# Patient Record
Sex: Female | Born: 1940
Health system: Southern US, Community
[De-identification: ages and names within clinical notes are randomized; demographics above are authoritative.]

## PROBLEM LIST (undated history)

## (undated) DIAGNOSIS — Z780 Asymptomatic menopausal state: Secondary | ICD-10-CM

## (undated) DIAGNOSIS — C801 Malignant (primary) neoplasm, unspecified: Secondary | ICD-10-CM

## (undated) DIAGNOSIS — N6019 Diffuse cystic mastopathy of unspecified breast: Secondary | ICD-10-CM

## (undated) DIAGNOSIS — I7300 Raynaud's syndrome without gangrene: Secondary | ICD-10-CM

## (undated) DIAGNOSIS — M5430 Sciatica, unspecified side: Secondary | ICD-10-CM

## (undated) DIAGNOSIS — R0789 Other chest pain: Secondary | ICD-10-CM

## (undated) DIAGNOSIS — N819 Female genital prolapse, unspecified: Secondary | ICD-10-CM

## (undated) DIAGNOSIS — H43813 Vitreous degeneration, bilateral: Secondary | ICD-10-CM

## (undated) DIAGNOSIS — D0511 Intraductal carcinoma in situ of right breast: Secondary | ICD-10-CM

## (undated) DIAGNOSIS — M722 Plantar fascial fibromatosis: Secondary | ICD-10-CM

## (undated) DIAGNOSIS — J309 Allergic rhinitis, unspecified: Secondary | ICD-10-CM

## (undated) DIAGNOSIS — G43909 Migraine, unspecified, not intractable, without status migrainosus: Secondary | ICD-10-CM

## (undated) DIAGNOSIS — Z8719 Personal history of other diseases of the digestive system: Secondary | ICD-10-CM

## (undated) DIAGNOSIS — M1990 Unspecified osteoarthritis, unspecified site: Secondary | ICD-10-CM

## (undated) DIAGNOSIS — E785 Hyperlipidemia, unspecified: Secondary | ICD-10-CM

## (undated) DIAGNOSIS — I209 Angina pectoris, unspecified: Secondary | ICD-10-CM

## (undated) DIAGNOSIS — R339 Retention of urine, unspecified: Secondary | ICD-10-CM

## (undated) DIAGNOSIS — K219 Gastro-esophageal reflux disease without esophagitis: Secondary | ICD-10-CM

## (undated) DIAGNOSIS — I639 Cerebral infarction, unspecified: Secondary | ICD-10-CM

## (undated) DIAGNOSIS — R911 Solitary pulmonary nodule: Secondary | ICD-10-CM

## (undated) DIAGNOSIS — C50919 Malignant neoplasm of unspecified site of unspecified female breast: Secondary | ICD-10-CM

## (undated) DIAGNOSIS — D649 Anemia, unspecified: Secondary | ICD-10-CM

## (undated) DIAGNOSIS — C44300 Unspecified malignant neoplasm of skin of unspecified part of face: Secondary | ICD-10-CM

## (undated) DIAGNOSIS — Z974 Presence of external hearing-aid: Secondary | ICD-10-CM

## (undated) DIAGNOSIS — T753XXA Motion sickness, initial encounter: Secondary | ICD-10-CM

## (undated) HISTORY — DX: Female genital prolapse, unspecified: N81.9

## (undated) HISTORY — DX: Unspecified osteoarthritis, unspecified site: M19.90

## (undated) HISTORY — DX: Migraine, unspecified, not intractable, without status migrainosus: G43.909

## (undated) HISTORY — DX: Malignant neoplasm of unspecified site of unspecified female breast: C50.919

## (undated) HISTORY — DX: Plantar fascial fibromatosis: M72.2

## (undated) HISTORY — PX: CARDIAC CATHETERIZATION: SHX172

## (undated) HISTORY — DX: Sciatica, unspecified side: M54.30

## (undated) HISTORY — DX: Retention of urine, unspecified: R33.9

## (undated) HISTORY — DX: Malignant (primary) neoplasm, unspecified: C80.1

## (undated) HISTORY — PX: COLONOSCOPY: SHX174

## (undated) HISTORY — PX: APPENDECTOMY: SHX54

## (undated) HISTORY — PX: SKIN CANCER EXCISION: SHX779

## (undated) HISTORY — DX: Asymptomatic menopausal state: Z78.0

## (undated) MED FILL — Fosaprepitant Dimeglumine For IV Infusion 150 MG (Base Eq): INTRAVENOUS | Qty: 5 | Status: AC

## (undated) MED FILL — Dexamethasone Sodium Phosphate Inj 100 MG/10ML: INTRAMUSCULAR | Qty: 1 | Status: AC

---
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
## 1999-08-27 HISTORY — PX: ESOPHAGOGASTRODUODENOSCOPY: SHX1529

## 2002-10-26 HISTORY — PX: CARDIAC CATHETERIZATION: SHX172

## 2004-12-26 ENCOUNTER — Encounter: Payer: Self-pay | Admitting: General Practice

## 2005-08-28 ENCOUNTER — Ambulatory Visit: Payer: Self-pay | Admitting: Unknown Physician Specialty

## 2005-11-20 ENCOUNTER — Ambulatory Visit: Payer: Self-pay | Admitting: Unknown Physician Specialty

## 2006-03-26 ENCOUNTER — Ambulatory Visit: Payer: Self-pay | Admitting: Unknown Physician Specialty

## 2006-05-26 ENCOUNTER — Ambulatory Visit: Payer: Self-pay | Admitting: Unknown Physician Specialty

## 2006-10-05 ENCOUNTER — Ambulatory Visit: Payer: Self-pay | Admitting: Obstetrics and Gynecology

## 2009-12-04 ENCOUNTER — Ambulatory Visit: Payer: Self-pay | Admitting: Unknown Physician Specialty

## 2011-01-15 ENCOUNTER — Ambulatory Visit: Payer: Self-pay | Admitting: Unknown Physician Specialty

## 2012-07-06 ENCOUNTER — Ambulatory Visit: Payer: Self-pay | Admitting: Internal Medicine

## 2012-07-12 ENCOUNTER — Encounter: Payer: Self-pay | Admitting: Internal Medicine

## 2012-07-26 ENCOUNTER — Encounter: Payer: Self-pay | Admitting: Internal Medicine

## 2012-10-05 ENCOUNTER — Ambulatory Visit: Payer: Self-pay | Admitting: Internal Medicine

## 2013-01-02 ENCOUNTER — Ambulatory Visit: Payer: Self-pay | Admitting: Internal Medicine

## 2013-07-11 ENCOUNTER — Ambulatory Visit: Payer: Self-pay | Admitting: Internal Medicine

## 2014-01-16 ENCOUNTER — Ambulatory Visit: Payer: Self-pay | Admitting: Internal Medicine

## 2014-09-17 DIAGNOSIS — Z85828 Personal history of other malignant neoplasm of skin: Secondary | ICD-10-CM | POA: Insufficient documentation

## 2014-12-10 ENCOUNTER — Ambulatory Visit: Payer: Self-pay | Admitting: Internal Medicine

## 2014-12-11 LAB — HM PAP SMEAR

## 2015-01-11 LAB — HM MAMMOGRAPHY

## 2015-01-14 DIAGNOSIS — M5430 Sciatica, unspecified side: Secondary | ICD-10-CM | POA: Insufficient documentation

## 2015-01-14 DIAGNOSIS — R339 Retention of urine, unspecified: Secondary | ICD-10-CM | POA: Insufficient documentation

## 2015-01-14 DIAGNOSIS — N951 Menopausal and female climacteric states: Secondary | ICD-10-CM | POA: Insufficient documentation

## 2015-11-15 DIAGNOSIS — H2513 Age-related nuclear cataract, bilateral: Secondary | ICD-10-CM | POA: Diagnosis not present

## 2015-12-17 ENCOUNTER — Encounter: Payer: Self-pay | Admitting: Obstetrics and Gynecology

## 2015-12-17 ENCOUNTER — Ambulatory Visit (INDEPENDENT_AMBULATORY_CARE_PROVIDER_SITE_OTHER): Payer: PRIVATE HEALTH INSURANCE | Admitting: Obstetrics and Gynecology

## 2015-12-17 VITALS — BP 144/66 | HR 67 | Ht 62.0 in | Wt 111.3 lb

## 2015-12-17 DIAGNOSIS — E785 Hyperlipidemia, unspecified: Secondary | ICD-10-CM | POA: Insufficient documentation

## 2015-12-17 DIAGNOSIS — Z1231 Encounter for screening mammogram for malignant neoplasm of breast: Secondary | ICD-10-CM

## 2015-12-17 DIAGNOSIS — N819 Female genital prolapse, unspecified: Secondary | ICD-10-CM | POA: Diagnosis not present

## 2015-12-17 DIAGNOSIS — J309 Allergic rhinitis, unspecified: Secondary | ICD-10-CM | POA: Insufficient documentation

## 2015-12-17 DIAGNOSIS — K219 Gastro-esophageal reflux disease without esophagitis: Secondary | ICD-10-CM | POA: Insufficient documentation

## 2015-12-17 DIAGNOSIS — Z79890 Hormone replacement therapy (postmenopausal): Secondary | ICD-10-CM | POA: Diagnosis not present

## 2015-12-17 DIAGNOSIS — R0789 Other chest pain: Secondary | ICD-10-CM | POA: Insufficient documentation

## 2015-12-17 DIAGNOSIS — N951 Menopausal and female climacteric states: Secondary | ICD-10-CM

## 2015-12-17 DIAGNOSIS — C4490 Unspecified malignant neoplasm of skin, unspecified: Secondary | ICD-10-CM | POA: Insufficient documentation

## 2015-12-17 DIAGNOSIS — N814 Uterovaginal prolapse, unspecified: Secondary | ICD-10-CM | POA: Insufficient documentation

## 2015-12-17 DIAGNOSIS — R918 Other nonspecific abnormal finding of lung field: Secondary | ICD-10-CM | POA: Insufficient documentation

## 2015-12-17 DIAGNOSIS — N6019 Diffuse cystic mastopathy of unspecified breast: Secondary | ICD-10-CM | POA: Insufficient documentation

## 2015-12-17 MED ORDER — ESTROPIPATE 1.5 MG PO TABS: 1.5000 mg | ORAL_TABLET | Freq: Every day | ORAL | Status: DC

## 2015-12-17 MED ORDER — MEDROXYPROGESTERONE ACETATE 5 MG PO TABS: 5.0000 mg | ORAL_TABLET | Freq: Every day | ORAL | Status: DC

## 2015-12-17 NOTE — Progress Notes (Signed)
Patient ID: Elizabeth Friedman, female   DOB: [birth year], 75 y.o.   MRN: [id number] ANNUAL PREVENTATIVE CARE GYN  ENCOUNTER NOTE  Subjective:       Elizabeth Friedman is a 75 y.o. No obstetric history on file. female here for a routine annual gynecologic exam.  Current complaints: 1.  Medicare breast and pelvic only 2. Follow-up in HRT  Elizabeth Friedman is on cyclic HRT therapy and has occasional withdrawal bleed. No pelvic pain. No vaginal discharge. Patient is using the hodge pessary for pelvic organ prolapse. This middle pessary has not been removed in the last 5 years because of difficulty with reinsertion. She is willing to leave the pessary in place the long as no symptomatology is identified. Elizabeth Friedman does desire to remain on HRT therapy.    Gynecologic History No LMP recorded. Patient is postmenopausal. Contraception: post menopausal status Last Pap: no further paps. Results were: normal Last mammogram: 01/11/2015 wnl. Results were: normal Estropipate 1.5 mg daily; Provera 5 mg days 1 through 12  Obstetric History OB History  No data available    Past Medical History  Diagnosis Date  . Sciatica   . Arthritis   . Menopause   . Urinary retention with incomplete bladder emptying   . Prolapse of female pelvic organs     hodge pessary  . Sciatic leg pain   . Plantar fasciitis     Past Surgical History  Procedure Laterality Date  . Appendectomy      Current Outpatient Prescriptions on File Prior to Visit  Medication Sig Dispense Refill  . acetaminophen (TYLENOL) 500 MG tablet Take 500 mg by mouth every 6 (six) hours as needed.    Elizabeth Friedman Kitchen aspirin EC 81 MG tablet Take 81 mg by mouth daily.    . calcium carbonate 1250 MG capsule Take 1,250 mg by mouth 2 (two) times daily with a meal.    . celecoxib (CELEBREX) 200 MG capsule Take 200 mg by mouth 2 (two) times daily.    . cholecalciferol (VITAMIN D) 1000 units tablet Take 1,000 Units by mouth daily.    Elizabeth Friedman Kitchen co-enzyme Q-10 30 MG capsule Take 30  mg by mouth 3 (three) times daily.    Elizabeth Friedman Kitchen estropipate (OGEN) 1.5 MG tablet Take 1.5 mg by mouth daily.    . medroxyPROGESTERone (PROVERA) 5 MG tablet Take 5 mg by mouth daily.    . Omega-3 Fatty Acids (FISH OIL) 1000 MG CAPS Take by mouth.    . simvastatin (ZOCOR) 10 MG tablet Take 10 mg by mouth daily.    . vitamin E 400 UNIT capsule Take 400 Units by mouth daily.     No current facility-administered medications on file prior to visit.    No Known Allergies  Social History   Social History  . Marital Status: Married    Spouse Name: N/A  . Number of Children: N/A  . Years of Education: N/A   Occupational History  . Not on file.   Social History Main Topics  . Smoking status: Never Smoker   . Smokeless tobacco: Not on file  . Alcohol Use: No  . Drug Use: No  . Sexual Activity: Not Currently    Birth Control/ Protection: Post-menopausal   Other Topics Concern  . Not on file   Social History Narrative    Family History  Problem Relation Age of Onset  . Breast cancer Mother   . Heart disease Father   . Colon cancer Brother   . Diabetes Neg  Hx   . Ovarian cancer Neg Hx     The following portions of the patient's history were reviewed and updated as appropriate: allergies, current medications, past family history, past medical history, past social history, past surgical history and problem list.  Review of Systems ROS Review of Systems - General ROS: negative for - chills, fatigue, fever, hot flashes, night sweats, weight gain or weight loss Psychological ROS: negative for - anxiety, decreased libido, depression, mood swings, physical abuse or sexual abuse Ophthalmic ROS: negative for - blurry vision, eye pain or loss of vision ENT ROS: negative for - headaches, hearing change, visual changes or vocal changes Allergy and Immunology ROS: negative for - hives, itchy/watery eyes or seasonal allergies Hematological and Lymphatic ROS: negative for - bleeding problems,  bruising, swollen lymph nodes or weight loss Endocrine ROS: negative for - galactorrhea, hair pattern changes, hot flashes, malaise/lethargy, mood swings, palpitations, polydipsia/polyuria, skin changes, temperature intolerance or unexpected weight changes Breast ROS: negative for - new or changing breast lumps or nipple discharge Respiratory ROS: negative for - cough or shortness of breath Cardiovascular ROS: negative for - chest pain, irregular heartbeat, palpitations or shortness of breath Gastrointestinal ROS: no abdominal pain, change in bowel habits, or black or bloody stools Genito-Urinary ROS: no dysuria, trouble voiding, or hematuria Musculoskeletal ROS: negative for - joint pain or joint stiffness Neurological ROS: negative for - bowel and bladder control changes Dermatological ROS: negative for rash and skin lesion changes   Objective:   BP 144/66 mmHg  Pulse 67  Ht 5\' 2"  (1.575 m)  Wt 111 lb 4.8 oz (50.485 kg)  BMI 20.35 kg/m2 CONSTITUTIONAL: Well-developed, well-nourished female in no acute distress.  PSYCHIATRIC: Normal mood and affect. Normal behavior. Normal judgment and thought content. [place]: Alert and oriented to person, place, and time. Normal muscle tone coordination. No cranial nerve deficit noted. HENT:  Normocephalic, atraumatic, External right and left ear normal. Oropharynx is clear and moist EYES: Conjunctivae and EOM are normal. Pupils are equal, round, and reactive to light. No scleral icterus.  NECK: Normal range of motion, supple, no masses.  Normal thyroid.  SKIN: Skin is warm and dry. No rash noted. Not diaphoretic. No erythema. No pallor. CARDIOVASCULAR: Normal heart rate noted, regular rhythm, no murmur. RESPIRATORY: Clear to auscultation bilaterally. Effort and breath sounds normal, no problems with respiration noted. BREASTS: Symmetric in size. No masses, skin changes, nipple drainage, or lymphadenopathy. ABDOMEN: Soft, normal bowel sounds, no  distention noted.  No tenderness, rebound or guarding.  BLADDER: Normal PELVIC:  External Genitalia: Normal  BUS: Normal  Vagina: Normal  Cervix: Normal  Uterus: Normal  Adnexa: Normal  RV: Moderate external hemorrhoids, nonthrombosed, No Rectal Masses and Normal Sphincter tone  MUSCULOSKELETAL: Normal range of motion. No tenderness.  No cyanosis, clubbing, or edema.  2+ distal pulses. LYMPHATIC: No Axillary, Supraclavicular, or Inguinal Adenopathy.    Assessment:   Annual gynecologic examination 75 y.o. Contraception: post menopausal status bmi-20 Menopausal on HRT therapy; desires to continue Pelvic organ prolapse treated with Janalyn Harder metal pessary, asymptomatic  Plan:  Pap: Not needed Mammogram: Ordered Stool Guaiac Testing:  colonoscopy this year Labs: thru pcp Routine preventative health maintenance measures emphasized: Exercise/Diet/Weight control, Tobacco Warnings and Alcohol/Substance use risks HRT refilled Continue with pessary use  Return to [place], CMA  Brayton Mars, MD  Note: This dictation was prepared with Dragon dictation along with smaller phrase technology. Any transcriptional errors that result from  this process are unintentional.

## 2015-12-17 NOTE — Patient Instructions (Signed)
1. Refill estropipate 1.5 mg daily 2. Refill Provera 5 mg days 1 through 12 each month 3. Mammogram is already scheduled. 4. No Pap smear is necessary. 5. Colonoscopy screening is to be done in the next 1-2 months. 6. Continue with Hodge pessary. 7. Return in 1 year for follow-up 8. Continue with calcium and vitamin D supplementation

## 2015-12-24 DIAGNOSIS — Z8371 Family history of colonic polyps: Secondary | ICD-10-CM | POA: Diagnosis not present

## 2015-12-24 DIAGNOSIS — Z800 Family history of malignant neoplasm of digestive organs: Secondary | ICD-10-CM | POA: Diagnosis not present

## 2016-01-16 DIAGNOSIS — Z791 Long term (current) use of non-steroidal anti-inflammatories (NSAID): Secondary | ICD-10-CM | POA: Diagnosis not present

## 2016-01-16 DIAGNOSIS — E784 Other hyperlipidemia: Secondary | ICD-10-CM | POA: Diagnosis not present

## 2016-01-23 DIAGNOSIS — J3089 Other allergic rhinitis: Secondary | ICD-10-CM | POA: Diagnosis not present

## 2016-01-23 DIAGNOSIS — Z7982 Long term (current) use of aspirin: Secondary | ICD-10-CM | POA: Diagnosis not present

## 2016-01-23 DIAGNOSIS — E784 Other hyperlipidemia: Secondary | ICD-10-CM | POA: Diagnosis not present

## 2016-01-23 DIAGNOSIS — K219 Gastro-esophageal reflux disease without esophagitis: Secondary | ICD-10-CM | POA: Diagnosis not present

## 2016-01-27 ENCOUNTER — Other Ambulatory Visit: Payer: Self-pay | Admitting: Obstetrics and Gynecology

## 2016-01-28 DIAGNOSIS — Z803 Family history of malignant neoplasm of breast: Secondary | ICD-10-CM | POA: Diagnosis not present

## 2016-01-28 DIAGNOSIS — Z1231 Encounter for screening mammogram for malignant neoplasm of breast: Secondary | ICD-10-CM | POA: Diagnosis not present

## 2016-02-17 ENCOUNTER — Encounter: Payer: Self-pay | Admitting: Obstetrics and Gynecology

## 2016-03-05 ENCOUNTER — Telehealth: Payer: Self-pay | Admitting: Obstetrics and Gynecology

## 2016-03-05 MED ORDER — ESTROPIPATE 1.5 MG PO TABS: 1.5000 mg | ORAL_TABLET | Freq: Every day | ORAL | Status: DC

## 2016-03-05 NOTE — Telephone Encounter (Signed)
Pt aware med erx. 

## 2016-03-05 NOTE — Telephone Encounter (Signed)
PT NEEDS REFILL ESTROPIPATE 1.5 MG TABS ([facility]) [place]

## 2016-03-26 ENCOUNTER — Encounter: Payer: Self-pay | Admitting: *Deleted

## 2016-03-27 ENCOUNTER — Encounter: Payer: Self-pay | Admitting: *Deleted

## 2016-03-27 ENCOUNTER — Encounter
Admission: RE | Disposition: A | Discharge status: Home / Self Care | Payer: Self-pay | Source: Ambulatory Visit | Attending: Unknown Physician Specialty

## 2016-03-27 ENCOUNTER — Ambulatory Visit: Payer: PPO | Admitting: Certified Registered"

## 2016-03-27 ENCOUNTER — Ambulatory Visit
Admission: RE | Admit: 2016-03-27 | Discharge: 2016-03-27 | Disposition: A | Discharge status: Home / Self Care | Payer: PPO | Source: Ambulatory Visit | Attending: Unknown Physician Specialty | Admitting: Unknown Physician Specialty

## 2016-03-27 DIAGNOSIS — Z803 Family history of malignant neoplasm of breast: Secondary | ICD-10-CM | POA: Diagnosis not present

## 2016-03-27 DIAGNOSIS — M1990 Unspecified osteoarthritis, unspecified site: Secondary | ICD-10-CM | POA: Insufficient documentation

## 2016-03-27 DIAGNOSIS — Z85828 Personal history of other malignant neoplasm of skin: Secondary | ICD-10-CM | POA: Diagnosis not present

## 2016-03-27 DIAGNOSIS — Z9889 Other specified postprocedural states: Secondary | ICD-10-CM | POA: Diagnosis not present

## 2016-03-27 DIAGNOSIS — H43813 Vitreous degeneration, bilateral: Secondary | ICD-10-CM | POA: Diagnosis not present

## 2016-03-27 DIAGNOSIS — J309 Allergic rhinitis, unspecified: Secondary | ICD-10-CM | POA: Diagnosis not present

## 2016-03-27 DIAGNOSIS — K640 First degree hemorrhoids: Secondary | ICD-10-CM | POA: Insufficient documentation

## 2016-03-27 DIAGNOSIS — N6019 Diffuse cystic mastopathy of unspecified breast: Secondary | ICD-10-CM | POA: Insufficient documentation

## 2016-03-27 DIAGNOSIS — E785 Hyperlipidemia, unspecified: Secondary | ICD-10-CM | POA: Insufficient documentation

## 2016-03-27 DIAGNOSIS — Z1211 Encounter for screening for malignant neoplasm of colon: Secondary | ICD-10-CM | POA: Insufficient documentation

## 2016-03-27 DIAGNOSIS — Z7982 Long term (current) use of aspirin: Secondary | ICD-10-CM | POA: Insufficient documentation

## 2016-03-27 DIAGNOSIS — R911 Solitary pulmonary nodule: Secondary | ICD-10-CM | POA: Insufficient documentation

## 2016-03-27 DIAGNOSIS — Z8249 Family history of ischemic heart disease and other diseases of the circulatory system: Secondary | ICD-10-CM | POA: Diagnosis not present

## 2016-03-27 DIAGNOSIS — K648 Other hemorrhoids: Secondary | ICD-10-CM | POA: Diagnosis not present

## 2016-03-27 DIAGNOSIS — Z9049 Acquired absence of other specified parts of digestive tract: Secondary | ICD-10-CM | POA: Insufficient documentation

## 2016-03-27 DIAGNOSIS — Z800 Family history of malignant neoplasm of digestive organs: Secondary | ICD-10-CM | POA: Diagnosis not present

## 2016-03-27 DIAGNOSIS — K219 Gastro-esophageal reflux disease without esophagitis: Secondary | ICD-10-CM | POA: Insufficient documentation

## 2016-03-27 HISTORY — PX: COLONOSCOPY WITH PROPOFOL: SHX5780

## 2016-03-27 HISTORY — DX: Malignant (primary) neoplasm, unspecified: C80.1

## 2016-03-27 HISTORY — DX: Solitary pulmonary nodule: R91.1

## 2016-03-27 HISTORY — DX: Vitreous degeneration, bilateral: H43.813

## 2016-03-27 HISTORY — DX: Other chest pain: R07.89

## 2016-03-27 HISTORY — DX: Allergic rhinitis, unspecified: J30.9

## 2016-03-27 HISTORY — DX: Hyperlipidemia, unspecified: E78.5

## 2016-03-27 HISTORY — DX: Gastro-esophageal reflux disease without esophagitis: K21.9

## 2016-03-27 HISTORY — DX: Diffuse cystic mastopathy of unspecified breast: N60.19

## 2016-03-27 SURGERY — COLONOSCOPY WITH PROPOFOL
Anesthesia: General

## 2016-03-27 MED ORDER — PROPOFOL 10 MG/ML IV BOLUS
INTRAVENOUS | Status: DC | PRN
  Administered 2016-03-27: 70 mg via INTRAVENOUS

## 2016-03-27 MED ORDER — SODIUM CHLORIDE 0.9 % IV SOLN
INTRAVENOUS | Status: DC
  Administered 2016-03-27: 08:00:00 via INTRAVENOUS

## 2016-03-27 MED ORDER — PROPOFOL 500 MG/50ML IV EMUL
INTRAVENOUS | Status: DC | PRN
  Administered 2016-03-27: 120 ug/kg/min via INTRAVENOUS

## 2016-03-27 MED ORDER — SODIUM CHLORIDE 0.9 % IV SOLN: INTRAVENOUS | Status: DC

## 2016-03-27 MED ORDER — LIDOCAINE 2% (20 MG/ML) 5 ML SYRINGE
INTRAMUSCULAR | Status: DC | PRN
  Administered 2016-03-27: 50 mg via INTRAVENOUS

## 2016-03-27 MED ORDER — MIDAZOLAM HCL 5 MG/5ML IJ SOLN
INTRAMUSCULAR | Status: DC | PRN
  Administered 2016-03-27: 1 mg via INTRAVENOUS

## 2016-03-27 NOTE — Anesthesia Postprocedure Evaluation (Signed)
Anesthesia Post Note  Patient: Elizabeth Friedman  Procedure(s) Performed: Procedure(s) (LRB): COLONOSCOPY WITH PROPOFOL (N/A)  Patient location during evaluation: Endoscopy Anesthesia Type: General Level of consciousness: awake and alert Pain management: pain level controlled Vital Signs Assessment: post-procedure vital signs reviewed and stable Respiratory status: spontaneous breathing, nonlabored ventilation, respiratory function stable and patient connected to nasal cannula oxygen Cardiovascular status: blood pressure returned to baseline and stable Postop Assessment: no signs of nausea or vomiting Anesthetic complications: no    Last Vitals:  Filed Vitals:   03/27/16 0839 03/27/16 0849  BP: 159/93 143/98  Pulse: 38 38  Temp:    Resp: 19 17    Last Pain: There were no vitals filed for this visit.               [name] S

## 2016-03-27 NOTE — H&P (Signed)
Primary Care Physician:  Pcp Not In System Primary Gastroenterologist:  Dr. Vira Agar  Pre-Procedure History & Physical: HPI:  Elizabeth Friedman is a 75 y.o. female is here for an colonoscopy.   Past Medical History  Diagnosis Date  . Sciatica   . Arthritis   . Menopause   . Urinary retention with incomplete bladder emptying   . Prolapse of female pelvic organs     hodge pessary  . Sciatic leg pain   . Plantar fasciitis   . Cancer ([place])     skin  . GERD (gastroesophageal reflux disease)   . Allergic rhinitis   . Atypical chest pain   . Vitreous detachment of both eyes   . Fibrocystic breast disease   . Hyperlipidemia   . Lung nodule     Past Surgical History  Procedure Laterality Date  . Appendectomy    . Cardiac catheterization    . Skin cancer excision      face and neck    Prior to Admission medications   Medication Sig Start Date End Date Taking? Authorizing Provider  aspirin 325 MG EC tablet Take 325 mg by mouth daily.   Yes Historical Provider, MD  celecoxib (CELEBREX) 200 MG capsule Take 200 mg by mouth 2 (two) times daily.   Yes Historical Provider, MD  Omega-3 Fatty Acids (FISH OIL) 1000 MG CAPS Take by mouth.   Yes Historical Provider, MD  omeprazole (PRILOSEC) 20 MG capsule Take 20 mg by mouth daily.   Yes Historical Provider, MD  polyethylene glycol powder (GLYCOLAX/MIRALAX) powder Take 1 Container by mouth once.   Yes Historical Provider, MD  simvastatin (ZOCOR) 10 MG tablet Take 10 mg by mouth daily.   Yes Historical Provider, MD  acetaminophen (TYLENOL) 500 MG tablet Take 500 mg by mouth every 6 (six) hours as needed.    Historical Provider, MD  aspirin EC 81 MG tablet Take 81 mg by mouth daily. Reported on 03/27/2016    Historical Provider, MD  calcium carbonate 1250 MG capsule Take 1,250 mg by mouth 2 (two) times daily with a meal.    Historical Provider, MD  cholecalciferol (VITAMIN D) 1000 units tablet Take 1,000 Units by mouth daily.    Historical  Provider, MD  co-enzyme Q-10 30 MG capsule Take 30 mg by mouth 3 (three) times daily.    Historical Provider, MD  estropipate (OGEN) 1.5 MG tablet Take 1 tablet (1.5 mg total) by mouth daily. 03/05/16   Alanda Slim Defrancesco, MD  medroxyPROGESTERone (PROVERA) 5 MG tablet TAKE ONE TABLET BY MOUTH ONCE DAILY ON  DAYS  1-12 01/28/16   Alanda Slim Defrancesco, MD  RESTASIS 0.05 % ophthalmic emulsion  10/24/15   Historical Provider, MD  vitamin E 400 UNIT capsule Take 400 Units by mouth daily.    Historical Provider, MD    Allergies as of 01/06/2016  . (No Known Allergies)    Family History  Problem Relation Age of Onset  . Breast cancer Mother   . Heart disease Father   . Colon cancer Brother   . Diabetes Neg Hx   . Ovarian cancer Neg Hx     Social History   Social History  . Marital Status: Married    Spouse Name: N/A  . Number of Children: N/A  . Years of Education: N/A   Occupational History  . Not on file.   Social History Main Topics  . Smoking status: Never Smoker   . Smokeless tobacco: Never  Used  . Alcohol Use: No  . Drug Use: No  . Sexual Activity: Not Currently    Birth Control/ Protection: Post-menopausal   Other Topics Concern  . Not on file   Social History Narrative    Review of Systems: See HPI, otherwise negative ROS  Physical Exam: BP 150/80 mmHg  Pulse 70  Temp(Src) 96.3 F (35.7 C) (Tympanic)  Resp 16  Ht 5\' 2"  (1.575 m)  Wt 49.896 kg (110 lb)  BMI 20.11 kg/m2  SpO2 95% General:   Alert,  pleasant and cooperative in NAD Head:  Normocephalic and atraumatic. Neck:  Supple; no masses or thyromegaly. Lungs:  Clear throughout to auscultation.    Heart:  Regular rate and rhythm. Abdomen:  Soft, nontender and nondistended. Normal bowel sounds, without guarding, and without rebound.   Neurologic:  Alert and  oriented x4;  grossly normal neurologically.  Impression/Plan: Elizabeth Friedman is here for an colonoscopy to be performed for family history  of colon cancer  Risks, benefits, limitations, and alternatives regarding  colonoscopy have been reviewed with the patient.  Questions have been answered.  All parties agreeable.   Gaylyn Cheers, MD  03/27/2016, 7:55 AM

## 2016-03-27 NOTE — Transfer of Care (Signed)
Immediate Anesthesia Transfer of Care Note  Patient: Elizabeth Friedman  Procedure(s) Performed: Procedure(s): COLONOSCOPY WITH PROPOFOL (N/A)  Patient Location: Endoscopy Unit  Anesthesia Type:General  Level of Consciousness: awake  Airway & Oxygen Therapy: Patient Spontanous Breathing and Patient connected to nasal cannula oxygen  Post-op Assessment: Report given to RN  Post vital signs: Reviewed  Last Vitals:  Filed Vitals:   03/27/16 0818 03/27/16 0819  BP:  113/67  Pulse:  72  Temp: 36.1 C 36.1 C  Resp:  14    Last Pain: There were no vitals filed for this visit.       Complications: No apparent anesthesia complications

## 2016-03-27 NOTE — Op Note (Signed)
[facility] Gastroenterology Patient Name: Elizabeth Friedman Procedure Date: 03/27/2016 7:56 AM MRN: [id number] Account #: [id number] Date of Birth: 10-17-1941 Admit Type: Outpatient Age: 75 Room: [facility] ENDO ROOM 4 Gender: Female Note Status: Finalized Procedure:            Colonoscopy Indications:          Screening in patient at increased risk: Family history                        of 1st-degree relative with colorectal cancer Providers:            Manya Silvas, MD Referring MD:         No Local Md, MD (Referring MD) Medicines:            Propofol per Anesthesia Complications:        No immediate complications. Procedure:            Pre-Anesthesia Assessment:                       - After reviewing the risks and benefits, the patient                        was deemed in satisfactory condition to undergo the                        procedure.                       After obtaining informed consent, the colonoscope was                        passed under direct vision. Throughout the procedure,                        the patient's blood pressure, pulse, and oxygen                        saturations were monitored continuously. The                        Colonoscope was introduced through the anus and                        advanced to the the cecum, identified by appendiceal                        orifice and ileocecal valve. The colonoscopy was                        performed without difficulty. The patient tolerated the                        procedure well. The quality of the bowel preparation                        was excellent. Findings:      Internal hemorrhoids were found during endoscopy. The hemorrhoids were       small and Grade I (internal hemorrhoids that do not prolapse).      The exam was otherwise without abnormality. Impression:           -  Internal hemorrhoids.                       - The examination was otherwise normal.                       -  No specimens collected. Recommendation:       - Repeat colonoscopy in 5 years for screening purposes. Manya Silvas, MD 03/27/2016 8:17:59 AM This report has been signed electronically. Number of Addenda: 0 Note Initiated On: 03/27/2016 7:56 AM Scope Withdrawal Time: 0 hours 5 minutes 49 seconds  Total Procedure Duration: 0 hours 12 minutes 29 seconds       [facility]

## 2016-03-27 NOTE — Anesthesia Preprocedure Evaluation (Signed)
Anesthesia Evaluation  Patient identified by MRN, date of birth, ID band Patient awake    Reviewed: Allergy & Precautions, NPO status , Patient's Chart, lab work & pertinent test results, reviewed documented beta blocker date and time   Airway Mallampati: II  TM Distance: >3 FB     Dental  (+) Chipped   Pulmonary           Cardiovascular      Neuro/Psych  Neuromuscular disease    GI/Hepatic GERD  ,  Endo/Other    Renal/GU      Musculoskeletal  (+) Arthritis ,   Abdominal   Peds  Hematology   Anesthesia Other Findings Hx of Raynauds.  Reproductive/Obstetrics                             Anesthesia Physical Anesthesia Plan  ASA: II  Anesthesia Plan: General   Post-op Pain Management:    Induction: Intravenous  Airway Management Planned: Nasal Cannula  Additional Equipment:   Intra-op Plan:   Post-operative Plan:   Informed Consent: I have reviewed the patients History and Physical, chart, labs and discussed the procedure including the risks, benefits and alternatives for the proposed anesthesia with the patient or authorized representative who has indicated his/her understanding and acceptance.     Plan Discussed with: CRNA  Anesthesia Plan Comments:         Anesthesia Quick Evaluation

## 2016-03-30 ENCOUNTER — Encounter: Payer: Self-pay | Admitting: Unknown Physician Specialty

## 2016-05-02 ENCOUNTER — Other Ambulatory Visit: Payer: Self-pay | Admitting: Obstetrics and Gynecology

## 2016-05-04 ENCOUNTER — Telehealth: Payer: Self-pay | Admitting: Obstetrics and Gynecology

## 2016-05-04 NOTE — Telephone Encounter (Signed)
Lmtrc. Provera erx.

## 2016-05-04 NOTE — Telephone Encounter (Signed)
PT CALLED AND SHE STATED THAT SHE  NEEDS MEDORXYPR 5 MG GENERIC FOR PROVERA, I TOLD HER TO CALL HER PHARMACY AND GET THEM TO SEND IN A REFILL REQUEST BUT SHE STATED THAT HER PHARMACY TOLD HER TO CALL us DUE TO SOMETIME NOT GETTING THE FAX.

## 2016-05-05 NOTE — Telephone Encounter (Signed)
Pt aware med erx. 

## 2016-07-16 DIAGNOSIS — E784 Other hyperlipidemia: Secondary | ICD-10-CM | POA: Diagnosis not present

## 2016-07-16 DIAGNOSIS — Z7982 Long term (current) use of aspirin: Secondary | ICD-10-CM | POA: Diagnosis not present

## 2016-07-16 DIAGNOSIS — K219 Gastro-esophageal reflux disease without esophagitis: Secondary | ICD-10-CM | POA: Diagnosis not present

## 2016-07-23 DIAGNOSIS — K219 Gastro-esophageal reflux disease without esophagitis: Secondary | ICD-10-CM | POA: Diagnosis not present

## 2016-07-23 DIAGNOSIS — Z791 Long term (current) use of non-steroidal anti-inflammatories (NSAID): Secondary | ICD-10-CM | POA: Diagnosis not present

## 2016-07-23 DIAGNOSIS — M5430 Sciatica, unspecified side: Secondary | ICD-10-CM | POA: Diagnosis not present

## 2016-07-23 DIAGNOSIS — E784 Other hyperlipidemia: Secondary | ICD-10-CM | POA: Diagnosis not present

## 2016-09-24 DIAGNOSIS — L708 Other acne: Secondary | ICD-10-CM | POA: Diagnosis not present

## 2016-09-24 DIAGNOSIS — L821 Other seborrheic keratosis: Secondary | ICD-10-CM | POA: Diagnosis not present

## 2016-09-24 DIAGNOSIS — Z85828 Personal history of other malignant neoplasm of skin: Secondary | ICD-10-CM | POA: Diagnosis not present

## 2016-09-24 DIAGNOSIS — B351 Tinea unguium: Secondary | ICD-10-CM | POA: Diagnosis not present

## 2016-10-30 DIAGNOSIS — M79602 Pain in left arm: Secondary | ICD-10-CM | POA: Diagnosis not present

## 2016-10-30 DIAGNOSIS — M19012 Primary osteoarthritis, left shoulder: Secondary | ICD-10-CM | POA: Diagnosis not present

## 2016-11-03 ENCOUNTER — Telehealth: Payer: Self-pay | Admitting: Obstetrics and Gynecology

## 2016-11-03 MED ORDER — MEDROXYPROGESTERONE ACETATE 5 MG PO TABS: 5.0000 mg | ORAL_TABLET | Freq: Every day | ORAL | 1 refills | Status: DC

## 2016-11-03 NOTE — Telephone Encounter (Signed)
PT CALLED AND SHE IS NEEDING A REFILL ON HER PROGESTERONE, I TOLD HER TO CALL HER PHARMACY AND SEND A REFILL REQUEST IN BUT SHE STATED THAT IN THE PAST WHEN SHE HAS DONE THAT WE NEVER GET THE FAX AND SHE HAS TO END UP CALLING us, HER APPT WITH [place] IS NOT UNTIL FEB.

## 2016-11-03 NOTE — Telephone Encounter (Signed)
Pt aware.

## 2016-11-03 NOTE — Telephone Encounter (Signed)
Lmtrc- med erx to [facility] [place].

## 2016-11-10 DIAGNOSIS — M25612 Stiffness of left shoulder, not elsewhere classified: Secondary | ICD-10-CM | POA: Diagnosis not present

## 2016-11-10 DIAGNOSIS — M25512 Pain in left shoulder: Secondary | ICD-10-CM | POA: Diagnosis not present

## 2016-11-10 DIAGNOSIS — M6281 Muscle weakness (generalized): Secondary | ICD-10-CM | POA: Diagnosis not present

## 2016-11-16 DIAGNOSIS — H35413 Lattice degeneration of retina, bilateral: Secondary | ICD-10-CM | POA: Diagnosis not present

## 2016-11-17 DIAGNOSIS — M6281 Muscle weakness (generalized): Secondary | ICD-10-CM | POA: Diagnosis not present

## 2016-11-17 DIAGNOSIS — M25612 Stiffness of left shoulder, not elsewhere classified: Secondary | ICD-10-CM | POA: Diagnosis not present

## 2016-11-17 DIAGNOSIS — M25512 Pain in left shoulder: Secondary | ICD-10-CM | POA: Diagnosis not present

## 2016-11-19 DIAGNOSIS — M25512 Pain in left shoulder: Secondary | ICD-10-CM | POA: Diagnosis not present

## 2016-11-19 DIAGNOSIS — M6281 Muscle weakness (generalized): Secondary | ICD-10-CM | POA: Diagnosis not present

## 2016-11-19 DIAGNOSIS — M25612 Stiffness of left shoulder, not elsewhere classified: Secondary | ICD-10-CM | POA: Diagnosis not present

## 2016-11-23 DIAGNOSIS — M25512 Pain in left shoulder: Secondary | ICD-10-CM | POA: Diagnosis not present

## 2016-11-23 DIAGNOSIS — M6281 Muscle weakness (generalized): Secondary | ICD-10-CM | POA: Diagnosis not present

## 2016-11-23 DIAGNOSIS — M25612 Stiffness of left shoulder, not elsewhere classified: Secondary | ICD-10-CM | POA: Diagnosis not present

## 2016-11-26 DIAGNOSIS — M79602 Pain in left arm: Secondary | ICD-10-CM | POA: Diagnosis not present

## 2016-11-26 DIAGNOSIS — M25512 Pain in left shoulder: Secondary | ICD-10-CM | POA: Diagnosis not present

## 2016-11-26 DIAGNOSIS — M25612 Stiffness of left shoulder, not elsewhere classified: Secondary | ICD-10-CM | POA: Diagnosis not present

## 2016-11-26 DIAGNOSIS — M6281 Muscle weakness (generalized): Secondary | ICD-10-CM | POA: Diagnosis not present

## 2016-11-30 DIAGNOSIS — M25612 Stiffness of left shoulder, not elsewhere classified: Secondary | ICD-10-CM | POA: Diagnosis not present

## 2016-11-30 DIAGNOSIS — M6281 Muscle weakness (generalized): Secondary | ICD-10-CM | POA: Diagnosis not present

## 2016-11-30 DIAGNOSIS — M25512 Pain in left shoulder: Secondary | ICD-10-CM | POA: Diagnosis not present

## 2016-12-03 DIAGNOSIS — M25512 Pain in left shoulder: Secondary | ICD-10-CM | POA: Diagnosis not present

## 2016-12-03 DIAGNOSIS — M25612 Stiffness of left shoulder, not elsewhere classified: Secondary | ICD-10-CM | POA: Diagnosis not present

## 2016-12-03 DIAGNOSIS — M6281 Muscle weakness (generalized): Secondary | ICD-10-CM | POA: Diagnosis not present

## 2016-12-08 DIAGNOSIS — M6281 Muscle weakness (generalized): Secondary | ICD-10-CM | POA: Diagnosis not present

## 2016-12-08 DIAGNOSIS — M25612 Stiffness of left shoulder, not elsewhere classified: Secondary | ICD-10-CM | POA: Diagnosis not present

## 2016-12-08 DIAGNOSIS — M25512 Pain in left shoulder: Secondary | ICD-10-CM | POA: Diagnosis not present

## 2016-12-10 DIAGNOSIS — M25512 Pain in left shoulder: Secondary | ICD-10-CM | POA: Diagnosis not present

## 2016-12-10 DIAGNOSIS — M25612 Stiffness of left shoulder, not elsewhere classified: Secondary | ICD-10-CM | POA: Diagnosis not present

## 2016-12-10 DIAGNOSIS — M6281 Muscle weakness (generalized): Secondary | ICD-10-CM | POA: Diagnosis not present

## 2016-12-10 NOTE — Progress Notes (Signed)
Patient ID: Elizabeth Friedman, female   DOB: [birth year], 76 y.o.   MRN: [id number] ANNUAL PREVENTATIVE CARE GYN  ENCOUNTER NOTE  Subjective:       Elizabeth Friedman is a 76 y.o. G2 P 2002 female here for a routine annual gynecologic exam.  Current complaints: 1.  Medicare breast and pelvic only 2. Follow-up in HRT 3. Pessary maintenance 4. Pelvic organ prolapse (cystocele, rectocele, uterine prolapse)  Elizabeth Friedman is on cyclic HRT therapy no  bleed. No pelvic pain. No vaginal discharge. Patient is using the hodge pessary for pelvic organ prolapse. This metal pessary has not been removed in the last 5 years because of difficulty with reinsertion. She is willing to leave the pessary in place the long as no symptomatology is identified. Elizabeth Friedman does desire to remain on HRT therapy. She is accepting of all risks associated with long-term HRT therapy. Bowel function is normal Bladder function is notable for incomplete bladder emptying requiring maneuvers to facilitate bladder drainage (bending over). Colonoscopy in 2017 was completed.    Gynecologic History No LMP recorded. Patient is postmenopausal. Contraception: post menopausal status Last Pap: no further paps. Results were: normal Last mammogram: 01/2016 birad 1 wnl. Results were: normal Estropipate 1.5 mg daily; Provera 5 mg days 1 through 12  Obstetric History OB History  No data available    Past Medical History:  Diagnosis Date  . Allergic rhinitis   . Arthritis   . Atypical chest pain   . Cancer ([place])    skin  . Fibrocystic breast disease   . GERD (gastroesophageal reflux disease)   . Hyperlipidemia   . Lung nodule   . Menopause   . Plantar fasciitis   . 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 Sciatic leg pain   . Sciatica   . Urinary retention with incomplete bladder emptying   . Vitreous detachment of both eyes     Past Surgical History:  Procedure Laterality Date  . APPENDECTOMY    . CARDIAC  CATHETERIZATION    . COLONOSCOPY WITH PROPOFOL N/A 03/27/2016   Procedure: COLONOSCOPY WITH PROPOFOL;  Surgeon: Manya Silvas, MD;  Location: [facility] ENDOSCOPY;  Service: Endoscopy;  Laterality: N/A;  . SKIN CANCER EXCISION     face and neck    Current Outpatient Prescriptions on File Prior to Visit  Medication Sig Dispense Refill  . acetaminophen (TYLENOL) 500 MG tablet Take 500 mg by mouth every 6 (six) hours as needed.    Marland Kitchen aspirin 325 MG EC tablet Take 325 mg by mouth daily.    Marland Kitchen aspirin EC 81 MG tablet Take 81 mg by mouth daily. Reported on 03/27/2016    . calcium carbonate 1250 MG capsule Take 1,250 mg by mouth 2 (two) times daily with a meal.    . celecoxib (CELEBREX) 200 MG capsule Take 200 mg by mouth 2 (two) times daily.    . cholecalciferol (VITAMIN D) 1000 units tablet Take 1,000 Units by mouth daily.    Marland Kitchen co-enzyme Q-10 30 MG capsule Take 30 mg by mouth 3 (three) times daily.    Marland Kitchen estropipate (OGEN) 1.5 MG tablet Take 1 tablet (1.5 mg total) by mouth daily. 90 tablet 4  . medroxyPROGESTERone (PROVERA) 5 MG tablet Take 1 tablet (5 mg total) by mouth daily. 36 tablet 1  . Omega-3 Fatty Acids (FISH OIL) 1000 MG CAPS Take by mouth.    Marland Kitchen omeprazole (PRILOSEC) 20 MG capsule Take 20 mg by  mouth daily.    . polyethylene glycol powder (GLYCOLAX/MIRALAX) powder Take 1 Container by mouth once.    . RESTASIS 0.05 % ophthalmic emulsion     . simvastatin (ZOCOR) 10 MG tablet Take 10 mg by mouth daily.    . vitamin E 400 UNIT capsule Take 400 Units by mouth daily.     No current facility-administered medications on file prior to visit.     No Known Allergies  Social History   Social History  . Marital status: Married    Spouse name: N/A  . Number of children: N/A  . Years of education: N/A   Occupational History  . Not on file.   Social History Main Topics  . Smoking status: Never Smoker  . Smokeless tobacco: Never Used  . Alcohol use No  . Drug use: No  . Sexual activity: Not  Currently    Birth control/ protection: Post-menopausal   Other Topics Concern  . Not on file   Social History Narrative  . No narrative on file    Family History  Problem Relation Age of Onset  . Breast cancer Mother   . Heart disease Father   . Colon cancer Brother   . Diabetes Neg Hx   . Ovarian cancer Neg Hx     The following portions of the patient's history were reviewed and updated as appropriate: allergies, current medications, past family history, past medical history, past social history, past surgical history and problem list.  Review of Systems Review of Systems  Constitutional: Negative for chills, fever and weight loss.  HENT: Negative.   Eyes: Negative.   Respiratory: Negative.   Cardiovascular: Negative.  Negative for chest pain and leg swelling.  Gastrointestinal: Negative.   Genitourinary: Negative.        Incomplete bladder emptying requires bending over to facilitate bladder drainage  Musculoskeletal:       Shoulder pain, under evaluation  Skin: Negative for itching and rash.  Neurological: Negative.  Negative for weakness.  Endo/Heme/Allergies: Negative.   Psychiatric/Behavioral: Negative.       Objective:   BP (!) 180/80   Pulse 70   Ht 5\' 2"  (1.575 m)   Wt 111 lb 3.2 oz (50.4 kg)   BMI 20.34 kg/m   CONSTITUTIONAL: Well-developed, well-nourished female in no acute distress.  PSYCHIATRIC: Normal mood and affect. Normal behavior. Normal judgment and thought content. Elizabeth Friedman: Alert and oriented to person, place, and time. Normal muscle tone coordination. No cranial nerve deficit noted. HENT:  Normocephalic, atraumatic, External right and left ear normal.  EYES: Conjunctivae and EOM are normal.  No scleral icterus.  NECK: Normal range of motion, supple, no masses.  Normal thyroid.  SKIN: Skin is warm and dry. No rash noted. Not diaphoretic. No erythema. No pallor. CARDIOVASCULAR: Normal heart rate noted, regular rhythm, no  murmur. RESPIRATORY: Clear to auscultation bilaterally. Effort and breath sounds normal, no problems with respiration noted. BREASTS: Symmetric in size. No masses, skin changes, nipple drainage, or lymphadenopathy. ABDOMEN: Soft, normal bowel sounds, no distention noted.  No tenderness, rebound or guarding.  BLADDER: Normal PELVIC:  External Genitalia: Normal  BUS: Normal  Vagina: Moderate atrophy  Cervix: Normal; no lesions  Uterus: Normal; midplane, normal size and shape, supported by Elizabeth Friedman pessary  Adnexa: Normal; nonpalpable and nontender  RV: Moderate external hemorrhoids, nonthrombosed  MUSCULOSKELETAL: Normal range of motion. No tenderness.  No cyanosis, clubbing, or edema.  2+ distal pulses. LYMPHATIC: No Axillary, Supraclavicular, or Inguinal Adenopathy.  Assessment:   Annual gynecologic examination 76 y.o. Contraception: post menopausal status bmi-20 Menopausal on HRT therapy; desires to continue Pelvic organ prolapse treated with Elizabeth Friedman metal pessary, asymptomatic Incomplete bladder emptying, requires bending over to facilitate drainage  Plan:  Pap: Not needed Mammogram: Ordered Stool Guaiac Testing:  Colonoscopy 03/2016 Labs: thru pcp Routine preventative health maintenance measures emphasized: Exercise/Diet/Weight control, Tobacco Warnings and Alcohol/Substance use risks HRT refilled(Ogen 1.5 mg daily; Provera 5 mg daily days 1 through 12) Continue with pessary use  Return to Elizabeth Friedman, Elizabeth Friedman  Elizabeth Mars, MD   Note: This dictation was prepared with Dragon dictation along with smaller phrase technology. Any transcriptional errors that result from this process are unintentional.

## 2016-12-14 DIAGNOSIS — M25612 Stiffness of left shoulder, not elsewhere classified: Secondary | ICD-10-CM | POA: Diagnosis not present

## 2016-12-14 DIAGNOSIS — M25512 Pain in left shoulder: Secondary | ICD-10-CM | POA: Diagnosis not present

## 2016-12-14 DIAGNOSIS — M6281 Muscle weakness (generalized): Secondary | ICD-10-CM | POA: Diagnosis not present

## 2016-12-17 ENCOUNTER — Encounter: Payer: PRIVATE HEALTH INSURANCE | Admitting: Obstetrics and Gynecology

## 2016-12-17 DIAGNOSIS — M6281 Muscle weakness (generalized): Secondary | ICD-10-CM | POA: Diagnosis not present

## 2016-12-17 DIAGNOSIS — M25612 Stiffness of left shoulder, not elsewhere classified: Secondary | ICD-10-CM | POA: Diagnosis not present

## 2016-12-17 DIAGNOSIS — M25512 Pain in left shoulder: Secondary | ICD-10-CM | POA: Diagnosis not present

## 2016-12-18 ENCOUNTER — Encounter: Payer: Self-pay | Admitting: Obstetrics and Gynecology

## 2016-12-18 ENCOUNTER — Ambulatory Visit (INDEPENDENT_AMBULATORY_CARE_PROVIDER_SITE_OTHER): Payer: PPO | Admitting: Obstetrics and Gynecology

## 2016-12-18 VITALS — BP 152/80 | HR 65 | Ht 62.0 in | Wt 111.2 lb

## 2016-12-18 DIAGNOSIS — Z124 Encounter for screening for malignant neoplasm of cervix: Secondary | ICD-10-CM | POA: Diagnosis not present

## 2016-12-18 DIAGNOSIS — N814 Uterovaginal prolapse, unspecified: Secondary | ICD-10-CM

## 2016-12-18 DIAGNOSIS — Z1239 Encounter for other screening for malignant neoplasm of breast: Secondary | ICD-10-CM

## 2016-12-18 DIAGNOSIS — N951 Menopausal and female climacteric states: Secondary | ICD-10-CM | POA: Diagnosis not present

## 2016-12-18 DIAGNOSIS — Z79890 Hormone replacement therapy (postmenopausal): Secondary | ICD-10-CM

## 2016-12-18 DIAGNOSIS — N8111 Cystocele, midline: Secondary | ICD-10-CM | POA: Insufficient documentation

## 2016-12-18 DIAGNOSIS — N816 Rectocele: Secondary | ICD-10-CM | POA: Diagnosis not present

## 2016-12-18 DIAGNOSIS — Z1231 Encounter for screening mammogram for malignant neoplasm of breast: Secondary | ICD-10-CM

## 2016-12-18 DIAGNOSIS — Z4689 Encounter for fitting and adjustment of other specified devices: Secondary | ICD-10-CM | POA: Diagnosis not present

## 2016-12-18 NOTE — Patient Instructions (Signed)
1. No Pap smear needed 2. Mammogram is due in April 3. No stool cards are given due to colonoscopy this past year 4. Continue with calcium and vitamin D supplementation daily 5. Continue with Ogen/Provera daily 6. Continue with healthy eating and exercise 7. Return in 1 year for pessary check and Medicare physical. Health Maintenance for Postmenopausal Women Introduction Menopause is a normal process in which your reproductive ability comes to an end. This process happens gradually over a span of months to years, usually between the ages of 34 and 77. Menopause is complete when you have missed 12 consecutive menstrual periods. It is important to talk with your health care provider about some of the most common conditions that affect postmenopausal women, such as heart disease, cancer, and bone loss (osteoporosis). Adopting a healthy lifestyle and getting preventive care can help to promote your health and wellness. Those actions can also lower your chances of developing some of these common conditions. What should I know about menopause? During menopause, you may experience a number of symptoms, such as:  Moderate-to-severe hot flashes.  Night sweats.  Decrease in sex drive.  Mood swings.  Headaches.  Tiredness.  Irritability.  Memory problems.  Insomnia. Choosing to treat or not to treat menopausal changes is an individual decision that you make with your health care provider. What should I know about hormone replacement therapy and supplements? Hormone therapy products are effective for treating symptoms that are associated with menopause, such as hot flashes and night sweats. Hormone replacement carries certain risks, especially as you become older. If you are thinking about using estrogen or estrogen with progestin treatments, discuss the benefits and risks with your health care provider. What should I know about heart disease and stroke? Heart disease, heart attack, and stroke  become more likely as you age. This may be due, in part, to the hormonal changes that your body experiences during menopause. These can affect how your body processes dietary fats, triglycerides, and cholesterol. Heart attack and stroke are both medical emergencies. There are many things that you can do to help prevent heart disease and stroke:  Have your blood pressure checked at least every 1-2 years. High blood pressure causes heart disease and increases the risk of stroke.  If you are 81-55 years old, ask your health care provider if you should take aspirin to prevent a heart attack or a stroke.  Do not use any tobacco products, including cigarettes, chewing tobacco, or electronic cigarettes. If you need help quitting, ask your health care provider.  It is important to eat a healthy diet and maintain a healthy weight.  Be sure to include plenty of vegetables, fruits, low-fat dairy products, and lean protein.  Avoid eating foods that are high in solid fats, added sugars, or salt (sodium).  Get regular exercise. This is one of the most important things that you can do for your health.  Try to exercise for at least 150 minutes each week. The type of exercise that you do should increase your heart rate and make you sweat. This is known as moderate-intensity exercise.  Try to do strengthening exercises at least twice each week. Do these in addition to the moderate-intensity exercise.  Know your numbers.Ask your health care provider to check your cholesterol and your blood glucose. Continue to have your blood tested as directed by your health care provider. What should I know about cancer screening? There are several types of cancer. Take the following steps to reduce your  risk and to catch any cancer development as early as possible. Breast Cancer  Practice breast self-awareness.  This means understanding how your breasts normally appear and feel.  It also means doing regular breast  self-exams. Let your health care provider know about any changes, no matter how small.  If you are 7 or older, have a clinician do a breast exam (clinical breast exam or CBE) every year. Depending on your age, family history, and medical history, it may be recommended that you also have a yearly breast X-ray (mammogram).  If you have a family history of breast cancer, talk with your health care provider about genetic screening.  If you are at high risk for breast cancer, talk with your health care provider about having an MRI and a mammogram every year.  Breast cancer (BRCA) gene test is recommended for women who have family members with BRCA-related cancers. Results of the assessment will determine the need for genetic counseling and BRCA1 and for BRCA2 testing. BRCA-related cancers include these types:  Breast. This occurs in males or females.  Ovarian.  Tubal. This may also be called fallopian tube cancer.  Cancer of the abdominal or pelvic lining (peritoneal cancer).  Prostate.  Pancreatic. Cervical, Uterine, and Ovarian Cancer  Your health care provider may recommend that you be screened regularly for cancer of the pelvic organs. These include your ovaries, uterus, and vagina. This screening involves a pelvic exam, which includes checking for microscopic changes to the surface of your cervix (Pap test).  For women ages 21-65, health care providers may recommend a pelvic exam and a Pap test every three years. For women ages 81-65, they may recommend the Pap test and pelvic exam, combined with testing for human papilloma virus (HPV), every five years. Some types of HPV increase your risk of cervical cancer. Testing for HPV may also be done on women of any age who have unclear Pap test results.  Other health care providers may not recommend any screening for nonpregnant women who are considered low risk for pelvic cancer and have no symptoms. Ask your health care provider if a screening  pelvic exam is right for you.  If you have had past treatment for cervical cancer or a condition that could lead to cancer, you need Pap tests and screening for cancer for at least 20 years after your treatment. If Pap tests have been discontinued for you, your risk factors (such as having a new sexual partner) need to be reassessed to determine if you should start having screenings again. Some women have medical problems that increase the chance of getting cervical cancer. In these cases, your health care provider may recommend that you have screening and Pap tests more often.  If you have a family history of uterine cancer or ovarian cancer, talk with your health care provider about genetic screening.  If you have vaginal bleeding after reaching menopause, tell your health care provider.  There are currently no reliable tests available to screen for ovarian cancer. Lung Cancer  Lung cancer screening is recommended for adults 88-33 years old who are at high risk for lung cancer because of a history of smoking. A yearly low-dose CT scan of the lungs is recommended if you:  Currently smoke.  Have a history of at least 30 pack-years of smoking and you currently smoke or have quit within the past 15 years. A pack-year is smoking an average of one pack of cigarettes per day for one year. Yearly screening  should:  Continue until it has been 15 years since you quit.  Stop if you develop a health problem that would prevent you from having lung cancer treatment. Colorectal Cancer  This type of cancer can be detected and can often be prevented.  Routine colorectal cancer screening usually begins at age 26 and continues through age 94.  If you have risk factors for colon cancer, your health care provider may recommend that you be screened at an earlier age.  If you have a family history of colorectal cancer, talk with your health care provider about genetic screening.  Your health care provider  may also recommend using home test kits to check for hidden blood in your stool.  A small camera at the end of a tube can be used to examine your colon directly (sigmoidoscopy or colonoscopy). This is done to check for the earliest forms of colorectal cancer.  Direct examination of the colon should be repeated every 5-10 years until age 22. However, if early forms of precancerous polyps or small growths are found or if you have a family history or genetic risk for colorectal cancer, you may need to be screened more often. Skin Cancer  Check your skin from head to toe regularly.  Monitor any moles. Be sure to tell your health care provider:  About any new moles or changes in moles, especially if there is a change in a mole's shape or color.  If you have a mole that is larger than the size of a pencil eraser.  If any of your family members has a history of skin cancer, especially at a young age, talk with your health care provider about genetic screening.  Always use sunscreen. Apply sunscreen liberally and repeatedly throughout the day.  Whenever you are outside, protect yourself by wearing long sleeves, pants, a wide-brimmed hat, and sunglasses. What should I know about osteoporosis? Osteoporosis is a condition in which bone destruction happens more quickly than new bone creation. After menopause, you may be at an increased risk for osteoporosis. To help prevent osteoporosis or the bone fractures that can happen because of osteoporosis, the following is recommended:  If you are 33-12 years old, get at least 1,000 mg of calcium and at least 600 mg of vitamin D per day.  If you are older than age 69 but younger than age 62, get at least 1,200 mg of calcium and at least 600 mg of vitamin D per day.  If you are older than age 43, get at least 1,200 mg of calcium and at least 800 mg of vitamin D per day. Smoking and excessive alcohol intake increase the risk of osteoporosis. Eat foods that are  rich in calcium and vitamin D, and do weight-bearing exercises several times each week as directed by your health care provider. What should I know about how menopause affects my mental health? Depression may occur at any age, but it is more common as you become older. Common symptoms of depression include:  Low or sad mood.  Changes in sleep patterns.  Changes in appetite or eating patterns.  Feeling an overall lack of motivation or enjoyment of activities that you previously enjoyed.  Frequent crying spells. Talk with your health care provider if you think that you are experiencing depression. What should I know about immunizations? It is important that you get and maintain your immunizations. These include:  Tetanus, diphtheria, and pertussis (Tdap) booster vaccine.  Influenza every year before the flu season begins.  Pneumonia vaccine.  Shingles vaccine. Your health care provider may also recommend other immunizations. This information is not intended to replace advice given to you by your health care provider. Make sure you discuss any questions you have with your health care provider. Document Released: 12/04/2005 Document Revised: 05/01/2016 Document Reviewed: 07/16/2015  2017 [employer]

## 2016-12-21 DIAGNOSIS — M25512 Pain in left shoulder: Secondary | ICD-10-CM | POA: Diagnosis not present

## 2016-12-21 DIAGNOSIS — M6281 Muscle weakness (generalized): Secondary | ICD-10-CM | POA: Diagnosis not present

## 2016-12-21 DIAGNOSIS — M25612 Stiffness of left shoulder, not elsewhere classified: Secondary | ICD-10-CM | POA: Diagnosis not present

## 2016-12-24 DIAGNOSIS — M25612 Stiffness of left shoulder, not elsewhere classified: Secondary | ICD-10-CM | POA: Diagnosis not present

## 2016-12-24 DIAGNOSIS — M6281 Muscle weakness (generalized): Secondary | ICD-10-CM | POA: Diagnosis not present

## 2016-12-24 DIAGNOSIS — M25512 Pain in left shoulder: Secondary | ICD-10-CM | POA: Diagnosis not present

## 2016-12-28 DIAGNOSIS — M25512 Pain in left shoulder: Secondary | ICD-10-CM | POA: Diagnosis not present

## 2016-12-28 DIAGNOSIS — M6281 Muscle weakness (generalized): Secondary | ICD-10-CM | POA: Diagnosis not present

## 2016-12-28 DIAGNOSIS — M25612 Stiffness of left shoulder, not elsewhere classified: Secondary | ICD-10-CM | POA: Diagnosis not present

## 2016-12-28 DIAGNOSIS — M79602 Pain in left arm: Secondary | ICD-10-CM | POA: Diagnosis not present

## 2016-12-31 DIAGNOSIS — M25512 Pain in left shoulder: Secondary | ICD-10-CM | POA: Diagnosis not present

## 2016-12-31 DIAGNOSIS — M6281 Muscle weakness (generalized): Secondary | ICD-10-CM | POA: Diagnosis not present

## 2016-12-31 DIAGNOSIS — M25612 Stiffness of left shoulder, not elsewhere classified: Secondary | ICD-10-CM | POA: Diagnosis not present

## 2017-01-07 DIAGNOSIS — M25512 Pain in left shoulder: Secondary | ICD-10-CM | POA: Diagnosis not present

## 2017-01-07 DIAGNOSIS — E784 Other hyperlipidemia: Secondary | ICD-10-CM | POA: Diagnosis not present

## 2017-01-07 DIAGNOSIS — M6281 Muscle weakness (generalized): Secondary | ICD-10-CM | POA: Diagnosis not present

## 2017-01-07 DIAGNOSIS — M25612 Stiffness of left shoulder, not elsewhere classified: Secondary | ICD-10-CM | POA: Diagnosis not present

## 2017-01-29 ENCOUNTER — Encounter: Payer: Self-pay | Admitting: Obstetrics and Gynecology

## 2017-01-29 DIAGNOSIS — Z803 Family history of malignant neoplasm of breast: Secondary | ICD-10-CM | POA: Diagnosis not present

## 2017-01-29 DIAGNOSIS — Z1231 Encounter for screening mammogram for malignant neoplasm of breast: Secondary | ICD-10-CM | POA: Diagnosis not present

## 2017-02-05 DIAGNOSIS — G8929 Other chronic pain: Secondary | ICD-10-CM | POA: Diagnosis not present

## 2017-02-05 DIAGNOSIS — M5030 Other cervical disc degeneration, unspecified cervical region: Secondary | ICD-10-CM | POA: Diagnosis not present

## 2017-02-05 DIAGNOSIS — M7521 Bicipital tendinitis, right shoulder: Secondary | ICD-10-CM | POA: Diagnosis not present

## 2017-02-05 DIAGNOSIS — M25511 Pain in right shoulder: Secondary | ICD-10-CM | POA: Diagnosis not present

## 2017-02-05 DIAGNOSIS — M25512 Pain in left shoulder: Secondary | ICD-10-CM | POA: Diagnosis not present

## 2017-02-05 DIAGNOSIS — M25312 Other instability, left shoulder: Secondary | ICD-10-CM | POA: Diagnosis not present

## 2017-02-09 ENCOUNTER — Other Ambulatory Visit: Payer: Self-pay | Admitting: Orthopedic Surgery

## 2017-02-09 DIAGNOSIS — G8929 Other chronic pain: Secondary | ICD-10-CM

## 2017-02-09 DIAGNOSIS — M25512 Pain in left shoulder: Secondary | ICD-10-CM

## 2017-02-09 DIAGNOSIS — M25312 Other instability, left shoulder: Secondary | ICD-10-CM

## 2017-02-18 ENCOUNTER — Ambulatory Visit
Admission: RE | Admit: 2017-02-18 | Discharge: 2017-02-18 | Disposition: A | Discharge status: Home / Self Care | Payer: PPO | Source: Ambulatory Visit | Attending: Orthopedic Surgery | Admitting: Orthopedic Surgery

## 2017-02-18 DIAGNOSIS — M25312 Other instability, left shoulder: Secondary | ICD-10-CM | POA: Diagnosis not present

## 2017-02-18 DIAGNOSIS — M25512 Pain in left shoulder: Secondary | ICD-10-CM | POA: Insufficient documentation

## 2017-02-18 DIAGNOSIS — G8929 Other chronic pain: Secondary | ICD-10-CM | POA: Diagnosis not present

## 2017-02-18 DIAGNOSIS — S43402A Unspecified sprain of left shoulder joint, initial encounter: Secondary | ICD-10-CM | POA: Diagnosis not present

## 2017-02-18 DIAGNOSIS — M7582 Other shoulder lesions, left shoulder: Secondary | ICD-10-CM | POA: Diagnosis not present

## 2017-02-18 DIAGNOSIS — M75122 Complete rotator cuff tear or rupture of left shoulder, not specified as traumatic: Secondary | ICD-10-CM | POA: Diagnosis not present

## 2017-03-01 ENCOUNTER — Other Ambulatory Visit: Payer: Self-pay

## 2017-03-01 MED ORDER — ESTROPIPATE 1.5 MG PO TABS: 1.5000 mg | ORAL_TABLET | Freq: Every day | ORAL | 4 refills | Status: DC

## 2017-03-10 DIAGNOSIS — M75122 Complete rotator cuff tear or rupture of left shoulder, not specified as traumatic: Secondary | ICD-10-CM | POA: Insufficient documentation

## 2017-03-10 DIAGNOSIS — M7522 Bicipital tendinitis, left shoulder: Secondary | ICD-10-CM | POA: Diagnosis not present

## 2017-03-10 DIAGNOSIS — M7582 Other shoulder lesions, left shoulder: Secondary | ICD-10-CM | POA: Diagnosis not present

## 2017-03-16 ENCOUNTER — Other Ambulatory Visit: Payer: Self-pay | Admitting: Obstetrics and Gynecology

## 2017-06-03 ENCOUNTER — Telehealth: Payer: Self-pay | Admitting: Obstetrics and Gynecology

## 2017-06-03 NOTE — Telephone Encounter (Signed)
Pt aware per vm [facility] has rx. Per pharmacist they are filling it now.

## 2017-06-03 NOTE — Telephone Encounter (Signed)
Patient called requesting a refill on estropipate. She stated the pharmacy faxed a request but we have not received it. Thanks

## 2017-07-20 DIAGNOSIS — E784 Other hyperlipidemia: Secondary | ICD-10-CM | POA: Diagnosis not present

## 2017-07-20 DIAGNOSIS — Z791 Long term (current) use of non-steroidal anti-inflammatories (NSAID): Secondary | ICD-10-CM | POA: Diagnosis not present

## 2017-07-27 DIAGNOSIS — Z85828 Personal history of other malignant neoplasm of skin: Secondary | ICD-10-CM | POA: Diagnosis not present

## 2017-07-27 DIAGNOSIS — R911 Solitary pulmonary nodule: Secondary | ICD-10-CM | POA: Diagnosis not present

## 2017-07-27 DIAGNOSIS — R202 Paresthesia of skin: Secondary | ICD-10-CM | POA: Diagnosis not present

## 2017-07-27 DIAGNOSIS — Z0000 Encounter for general adult medical examination without abnormal findings: Secondary | ICD-10-CM | POA: Diagnosis not present

## 2017-07-27 DIAGNOSIS — K219 Gastro-esophageal reflux disease without esophagitis: Secondary | ICD-10-CM | POA: Diagnosis not present

## 2017-07-27 DIAGNOSIS — E7849 Other hyperlipidemia: Secondary | ICD-10-CM | POA: Diagnosis not present

## 2017-11-03 ENCOUNTER — Telehealth: Payer: Self-pay | Admitting: *Deleted

## 2017-11-03 ENCOUNTER — Other Ambulatory Visit: Payer: Self-pay | Admitting: Obstetrics and Gynecology

## 2017-11-03 NOTE — Telephone Encounter (Signed)
Patient called and states she needs a refill of her provera. Patient pharmacy is [facility] on [address] Please advise. Thank you

## 2017-11-03 NOTE — Telephone Encounter (Signed)
Pt aware per vm med has been erx.

## 2017-12-01 DIAGNOSIS — H2513 Age-related nuclear cataract, bilateral: Secondary | ICD-10-CM | POA: Diagnosis not present

## 2017-12-17 NOTE — Progress Notes (Signed)
Patient ID: Elizabeth Friedman, female   DOB: [birth year], 77 y.o.   MRN: [id number] ANNUAL PREVENTATIVE CARE GYN  ENCOUNTER NOTE  Subjective:       Elizabeth Friedman is a 77 y.o. G2 P 2002 female here for a routine annual gynecologic exam.  Current complaints: 1.  Medicare breast and pelvic only 2. Follow-up in HRT-estropipate d/c needs alternative 3. Pessary maintenance 4. Pelvic organ prolapse (cystocele, rectocele, uterine prolapse)  Elizabeth Friedman is on cyclic HRT therapy no  bleed. No pelvic pain. No vaginal discharge. Patient is using the hodge pessary for pelvic organ prolapse. This metal pessary has not been removed in the last 6 years because of difficulty with reinsertion. She is willing to leave the pessary in place the long as no symptomatology is identified. Elizabeth Friedman does desire to remain on HRT therapy. She is accepting of all risks associated with long-term HRT therapy. Bowel function is normal Bladder function is notable for incomplete bladder emptying requiring maneuvers to facilitate bladder drainage (bending over). Colonoscopy in 2017 was completed.    Gynecologic History No LMP recorded. Patient is postmenopausal. Contraception: post menopausal status Last Pap: no further paps. Results were: normal Last mammogram: 01/2017 birad 1 wnl. At [facility]  Results were: normal Estropipate 1.5 mg daily; Provera 5 mg days 1 through 12  Obstetric History OB History  Gravida Para Term Preterm AB Living  2 2 2     2   SAB TAB Ectopic Multiple Live Births          2    # Outcome Date GA Lbr Len/2nd Weight Sex Delivery Anes PTL Lv  2 Term 1964    F Vag-Spont   LIV  1 Term 1962    F Vag-Spont   LIV      Past Medical History:  Diagnosis Date  . Allergic rhinitis   . Arthritis   . Atypical chest pain   . Cancer ([place])    skin  . Fibrocystic breast disease   . GERD (gastroesophageal reflux disease)   . Hyperlipidemia   . Lung nodule   . Menopause   . Plantar fasciitis   . Prolapse of  female pelvic organs    hodge pessary  . Sciatic leg pain   . Sciatica   . Urinary retention with incomplete bladder emptying   . Vitreous detachment of both eyes     Past Surgical History:  Procedure Laterality Date  . APPENDECTOMY    . CARDIAC CATHETERIZATION    . COLONOSCOPY WITH PROPOFOL N/A 03/27/2016   Procedure: COLONOSCOPY WITH PROPOFOL;  Surgeon: Manya Silvas, MD;  Location: [facility];  Service: Endoscopy;  Laterality: N/A;  . SKIN CANCER EXCISION     face and neck    Current Outpatient Medications on File Prior to Visit  Medication Sig Dispense Refill  . acetaminophen (TYLENOL) 500 MG tablet Take 500 mg by mouth every 6 (six) hours as needed.    Marland Kitchen aspirin 325 MG EC tablet Take 325 mg by mouth daily.    . Calcium Acetate-Magnesium Carb (MAGNEBIND 200 PO) Take by mouth.    . calcium carbonate 1250 MG capsule Take 1,250 mg by mouth 2 (two) times daily with a meal.    . celecoxib (CELEBREX) 200 MG capsule Take 200 mg by mouth 2 (two) times daily.    . cholecalciferol (VITAMIN D) 1000 units tablet Take 1,000 Units by mouth daily.    Marland Kitchen co-enzyme Q-10 30 MG capsule Take 30 mg by  mouth 3 (three) times daily.    Marland Kitchen estropipate (OGEN) 1.5 MG tablet Take 1 tablet (1.5 mg total) by mouth daily. 90 tablet 4  . medroxyPROGESTERone (PROVERA) 5 MG tablet TAKE 1 TABLET BY MOUTH ONCE DAILY 36 tablet 1  . Omega-3 Fatty Acids (FISH OIL) 1000 MG CAPS Take by mouth.    Marland Kitchen omeprazole (PRILOSEC) 20 MG capsule Take 20 mg by mouth daily.    . RESTASIS 0.05 % ophthalmic emulsion     . simvastatin (ZOCOR) 10 MG tablet Take 10 mg by mouth daily.     No current facility-administered medications on file prior to visit.     No Known Allergies  Social History   Socioeconomic History  . Marital status: Married    Spouse name: Not on file  . Number of children: Not on file  . Years of education: Not on file  . Highest education level: Not on file  Social Needs  . Financial resource  strain: Not on file  . Food insecurity - worry: Not on file  . Food insecurity - inability: Not on file  . Transportation needs - medical: Not on file  . Transportation needs - non-medical: Not on file  Occupational History  . Not on file  Tobacco Use  . Smoking status: Never Smoker  . Smokeless tobacco: Never Used  Substance and Sexual Activity  . Alcohol use: No  . Drug use: No  . Sexual activity: Not Currently    Birth control/protection: Post-menopausal  Other Topics Concern  . Not on file  Social History Narrative  . Not on file    Family History  Problem Relation Age of Onset  . Breast cancer Mother   . Heart disease Father   . Colon cancer Brother   . Diabetes Neg Hx   . Ovarian cancer Neg Hx     The following portions of the patient's history were reviewed and updated as appropriate: allergies, current medications, past family history, past medical history, past social history, past surgical history and problem list.  Review of Systems Review of Systems  Constitutional: Negative.   HENT: Negative.   Eyes: Negative.   Respiratory: Negative.   Cardiovascular: Negative.   Gastrointestinal: Negative.   Genitourinary: Negative.        Occasionally needs to bend forward to facilitate complete bladder emptying  Musculoskeletal: Negative.   Skin: Negative.   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   Psychiatric/Behavioral: Positive for memory loss.       Self-reported occasional memory loss and forgetfulness       Objective:   BP (!) 163/66   Pulse 70   Ht 5\' 2"  (1.575 m)   Wt 111 lb 6.4 oz (50.5 kg)   BMI 20.38 kg/m  CONSTITUTIONAL: Well-developed, well-nourished female in no acute distress.  PSYCHIATRIC: Normal mood and affect. Normal behavior. Normal judgment and thought content. [place]: Alert and oriented to person, place, and time. Normal muscle tone coordination. No cranial nerve deficit  noted. HENT:  Normocephalic, atraumatic, External right and left ear normal.  EYES: Conjunctivae and EOM are normal.  No scleral icterus.  NECK: Normal range of motion, supple, no masses.  Normal thyroid.  SKIN: Skin is warm and dry. No rash noted. Not diaphoretic. No erythema. No pallor. CARDIOVASCULAR: Normal heart rate noted, regular rhythm, no murmur. RESPIRATORY: Clear to auscultation bilaterally. Effort and breath sounds normal, no problems with respiration noted. BREASTS:  Symmetric in size. No masses, skin changes, nipple drainage, or lymphadenopathy. ABDOMEN: Soft, normal bowel sounds, no distention noted.  No tenderness, rebound or guarding.  BLADDER: Normal PELVIC:  External Genitalia: Normal  BUS: Normal  Vagina: Fair estrogen effect  Cervix: Normal; no lesions  Uterus: Normal; midplane, normal size and shape, supported by Janalyn Harder pessary  Adnexa: Normal; nonpalpable and nontender  RV: Moderate external hemorrhoids, nonthrombosed  MUSCULOSKELETAL: Normal range of motion. No tenderness.  No cyanosis, clubbing, or edema.  2+ distal pulses. LYMPHATIC: No Axillary, Supraclavicular, or Inguinal Adenopathy.    Assessment:   Annual gynecologic examination 77 y.o. Contraception: post menopausal status bmi-20 Menopausal on HRT therapy; desires to continue Pelvic organ prolapse treated with Janalyn Harder metal pessary, asymptomatic Incomplete bladder emptying, requires bending over to facilitate drainage  Plan:  Pap: Not needed Mammogram: Ordered Stool Guaiac Testing:  ordered Labs: thru pcp Routine preventative health maintenance measures emphasized: Exercise/Diet/Weight control, Tobacco Warnings and Alcohol/Substance use risks HRT refilled-estradiol 1 mg a day; Provera 5 mg day days 1 through 12 (estropipate has been discontinued) Continue with pessary use  Return to [place], CMA  Brayton Mars, MD   Note: This dictation was prepared with Dragon  dictation along with smaller phrase technology. Any transcriptional errors that result from this process are unintentional.

## 2017-12-21 ENCOUNTER — Encounter: Payer: PPO | Admitting: Obstetrics and Gynecology

## 2017-12-21 ENCOUNTER — Ambulatory Visit (INDEPENDENT_AMBULATORY_CARE_PROVIDER_SITE_OTHER): Payer: PPO | Admitting: Obstetrics and Gynecology

## 2017-12-21 ENCOUNTER — Encounter: Payer: Self-pay | Admitting: Obstetrics and Gynecology

## 2017-12-21 VITALS — BP 163/66 | HR 70 | Ht 62.0 in | Wt 111.4 lb

## 2017-12-21 DIAGNOSIS — N814 Uterovaginal prolapse, unspecified: Secondary | ICD-10-CM

## 2017-12-21 DIAGNOSIS — Z79890 Hormone replacement therapy (postmenopausal): Secondary | ICD-10-CM

## 2017-12-21 DIAGNOSIS — N816 Rectocele: Secondary | ICD-10-CM

## 2017-12-21 DIAGNOSIS — N8111 Cystocele, midline: Secondary | ICD-10-CM

## 2017-12-21 DIAGNOSIS — N951 Menopausal and female climacteric states: Secondary | ICD-10-CM | POA: Diagnosis not present

## 2017-12-21 DIAGNOSIS — Z4689 Encounter for fitting and adjustment of other specified devices: Secondary | ICD-10-CM | POA: Diagnosis not present

## 2017-12-21 DIAGNOSIS — Z1231 Encounter for screening mammogram for malignant neoplasm of breast: Secondary | ICD-10-CM | POA: Diagnosis not present

## 2017-12-21 DIAGNOSIS — Z1211 Encounter for screening for malignant neoplasm of colon: Secondary | ICD-10-CM | POA: Diagnosis not present

## 2017-12-21 MED ORDER — ESTRADIOL 1 MG PO TABS: 1.0000 mg | ORAL_TABLET | Freq: Every day | ORAL | 1 refills | Status: DC

## 2017-12-21 MED ORDER — MEDROXYPROGESTERONE ACETATE 5 MG PO TABS: 5.0000 mg | ORAL_TABLET | Freq: Every day | ORAL | 3 refills | Status: DC

## 2017-12-21 NOTE — Patient Instructions (Signed)
1.  No Pap smear is needed. 2.  Mammogram is ordered for April 2019 3.  Stool guaiac testing is ordered for colon cancer screening 4.  Screening labs are to be obtained through primary care-Dr. Caryl Comes at [place] [facility] 5.  Continue with healthy eating and exercise. 6.  Continue with calcium and vitamin D supplementation 7.  Estradiol 1 mg a day is prescribed (to replace estropipate); Provera 5 mg daily days 1 through 12 is to be continued 8.  Return in 1 year for physical   Health Maintenance for Postmenopausal Women Menopause is a normal process in which your reproductive ability comes to an end. This process happens gradually over a span of months to years, usually between the ages of 70 and 76. Menopause is complete when you have missed 12 consecutive menstrual periods. It is important to talk with your health care provider about some of the most common conditions that affect postmenopausal women, such as heart disease, cancer, and bone loss (osteoporosis). Adopting a healthy lifestyle and getting preventive care can help to promote your health and wellness. Those actions can also lower your chances of developing some of these common conditions. What should I know about menopause? During menopause, you may experience a number of symptoms, such as:  Moderate-to-severe hot flashes.  Night sweats.  Decrease in sex drive.  Mood swings.  Headaches.  Tiredness.  Irritability.  Memory problems.  Insomnia.  Choosing to treat or not to treat menopausal changes is an individual decision that you make with your health care provider. What should I know about hormone replacement therapy and supplements? Hormone therapy products are effective for treating symptoms that are associated with menopause, such as hot flashes and night sweats. Hormone replacement carries certain risks, especially as you become older. If you are thinking about using estrogen or estrogen with progestin treatments,  discuss the benefits and risks with your health care provider. What should I know about heart disease and stroke? Heart disease, heart attack, and stroke become more likely as you age. This may be due, in part, to the hormonal changes that your body experiences during menopause. These can affect how your body processes dietary fats, triglycerides, and cholesterol. Heart attack and stroke are both medical emergencies. There are many things that you can do to help prevent heart disease and stroke:  Have your blood pressure checked at least every 1-2 years. High blood pressure causes heart disease and increases the risk of stroke.  If you are 81-44 years old, ask your health care provider if you should take aspirin to prevent a heart attack or a stroke.  Do not use any tobacco products, including cigarettes, chewing tobacco, or electronic cigarettes. If you need help quitting, ask your health care provider.  It is important to eat a healthy diet and maintain a healthy weight. ? Be sure to include plenty of vegetables, fruits, low-fat dairy products, and lean protein. ? Avoid eating foods that are high in solid fats, added sugars, or salt (sodium).  Get regular exercise. This is one of the most important things that you can do for your health. ? Try to exercise for at least 150 minutes each week. The type of exercise that you do should increase your heart rate and make you sweat. This is known as moderate-intensity exercise. ? Try to do strengthening exercises at least twice each week. Do these in addition to the moderate-intensity exercise.  Know your numbers.Ask your health care provider to check your cholesterol  and your blood glucose. Continue to have your blood tested as directed by your health care provider.  What should I know about cancer screening? There are several types of cancer. Take the following steps to reduce your risk and to catch any cancer development as early as  possible. Breast Cancer  Practice breast self-awareness. ? This means understanding how your breasts normally appear and feel. ? It also means doing regular breast self-exams. Let your health care provider know about any changes, no matter how small.  If you are 31 or older, have a clinician do a breast exam (clinical breast exam or CBE) every year. Depending on your age, family history, and medical history, it may be recommended that you also have a yearly breast X-ray (mammogram).  If you have a family history of breast cancer, talk with your health care provider about genetic screening.  If you are at high risk for breast cancer, talk with your health care provider about having an MRI and a mammogram every year.  Breast cancer (BRCA) gene test is recommended for women who have family members with BRCA-related cancers. Results of the assessment will determine the need for genetic counseling and BRCA1 and for BRCA2 testing. BRCA-related cancers include these types: ? Breast. This occurs in males or females. ? Ovarian. ? Tubal. This may also be called fallopian tube cancer. ? Cancer of the abdominal or pelvic lining (peritoneal cancer). ? Prostate. ? Pancreatic.  Cervical, Uterine, and Ovarian Cancer Your health care provider may recommend that you be screened regularly for cancer of the pelvic organs. These include your ovaries, uterus, and vagina. This screening involves a pelvic exam, which includes checking for microscopic changes to the surface of your cervix (Pap test).  For women ages 21-65, health care providers may recommend a pelvic exam and a Pap test every three years. For women ages 70-65, they may recommend the Pap test and pelvic exam, combined with testing for human papilloma virus (HPV), every five years. Some types of HPV increase your risk of cervical cancer. Testing for HPV may also be done on women of any age who have unclear Pap test results.  Other health care  providers may not recommend any screening for nonpregnant women who are considered low risk for pelvic cancer and have no symptoms. Ask your health care provider if a screening pelvic exam is right for you.  If you have had past treatment for cervical cancer or a condition that could lead to cancer, you need Pap tests and screening for cancer for at least 20 years after your treatment. If Pap tests have been discontinued for you, your risk factors (such as having a new sexual partner) need to be reassessed to determine if you should start having screenings again. Some women have medical problems that increase the chance of getting cervical cancer. In these cases, your health care provider may recommend that you have screening and Pap tests more often.  If you have a family history of uterine cancer or ovarian cancer, talk with your health care provider about genetic screening.  If you have vaginal bleeding after reaching menopause, tell your health care provider.  There are currently no reliable tests available to screen for ovarian cancer.  Lung Cancer Lung cancer screening is recommended for adults 29-2 years old who are at high risk for lung cancer because of a history of smoking. A yearly low-dose CT scan of the lungs is recommended if you:  Currently smoke.  Have a  history of at least 30 pack-years of smoking and you currently smoke or have quit within the past 15 years. A pack-year is smoking an average of one pack of cigarettes per day for one year.  Yearly screening should:  Continue until it has been 15 years since you quit.  Stop if you develop a health problem that would prevent you from having lung cancer treatment.  Colorectal Cancer  This type of cancer can be detected and can often be prevented.  Routine colorectal cancer screening usually begins at age 83 and continues through age 88.  If you have risk factors for colon cancer, your health care provider may recommend  that you be screened at an earlier age.  If you have a family history of colorectal cancer, talk with your health care provider about genetic screening.  Your health care provider may also recommend using home test kits to check for hidden blood in your stool.  A small camera at the end of a tube can be used to examine your colon directly (sigmoidoscopy or colonoscopy). This is done to check for the earliest forms of colorectal cancer.  Direct examination of the colon should be repeated every 5-10 years until age 1. However, if early forms of precancerous polyps or small growths are found or if you have a family history or genetic risk for colorectal cancer, you may need to be screened more often.  Skin Cancer  Check your skin from head to toe regularly.  Monitor any moles. Be sure to tell your health care provider: ? About any new moles or changes in moles, especially if there is a change in a mole's shape or color. ? If you have a mole that is larger than the size of a pencil eraser.  If any of your family members has a history of skin cancer, especially at a young age, talk with your health care provider about genetic screening.  Always use sunscreen. Apply sunscreen liberally and repeatedly throughout the day.  Whenever you are outside, protect yourself by wearing long sleeves, pants, a wide-brimmed hat, and sunglasses.  What should I know about osteoporosis? Osteoporosis is a condition in which bone destruction happens more quickly than new bone creation. After menopause, you may be at an increased risk for osteoporosis. To help prevent osteoporosis or the bone fractures that can happen because of osteoporosis, the following is recommended:  If you are 12-9 years old, get at least 1,000 mg of calcium and at least 600 mg of vitamin D per day.  If you are older than age 66 but younger than age 28, get at least 1,200 mg of calcium and at least 600 mg of vitamin D per day.  If you  are older than age 16, get at least 1,200 mg of calcium and at least 800 mg of vitamin D per day.  Smoking and excessive alcohol intake increase the risk of osteoporosis. Eat foods that are rich in calcium and vitamin D, and do weight-bearing exercises several times each week as directed by your health care provider. What should I know about how menopause affects my mental health? Depression may occur at any age, but it is more common as you become older. Common symptoms of depression include:  Low or sad mood.  Changes in sleep patterns.  Changes in appetite or eating patterns.  Feeling an overall lack of motivation or enjoyment of activities that you previously enjoyed.  Frequent crying spells.  Talk with your health care  provider if you think that you are experiencing depression. What should I know about immunizations? It is important that you get and maintain your immunizations. These include:  Tetanus, diphtheria, and pertussis (Tdap) booster vaccine.  Influenza every year before the flu season begins.  Pneumonia vaccine.  Shingles vaccine.  Your health care provider may also recommend other immunizations. This information is not intended to replace advice given to you by your health care provider. Make sure you discuss any questions you have with your health care provider. Document Released: 12/04/2005 Document Revised: 05/01/2016 Document Reviewed: 07/16/2015 Elsevier Interactive Patient Education  2018 [employer].

## 2017-12-22 DIAGNOSIS — Z1211 Encounter for screening for malignant neoplasm of colon: Secondary | ICD-10-CM | POA: Diagnosis not present

## 2017-12-27 LAB — FECAL OCCULT BLOOD, IMMUNOCHEMICAL: Fecal Occult Bld: NEGATIVE

## 2018-01-19 DIAGNOSIS — R202 Paresthesia of skin: Secondary | ICD-10-CM | POA: Diagnosis not present

## 2018-01-19 DIAGNOSIS — Z85828 Personal history of other malignant neoplasm of skin: Secondary | ICD-10-CM | POA: Diagnosis not present

## 2018-01-19 DIAGNOSIS — K219 Gastro-esophageal reflux disease without esophagitis: Secondary | ICD-10-CM | POA: Diagnosis not present

## 2018-01-19 DIAGNOSIS — R911 Solitary pulmonary nodule: Secondary | ICD-10-CM | POA: Diagnosis not present

## 2018-01-19 DIAGNOSIS — E7849 Other hyperlipidemia: Secondary | ICD-10-CM | POA: Diagnosis not present

## 2018-01-26 DIAGNOSIS — K219 Gastro-esophageal reflux disease without esophagitis: Secondary | ICD-10-CM | POA: Diagnosis not present

## 2018-01-26 DIAGNOSIS — M5430 Sciatica, unspecified side: Secondary | ICD-10-CM | POA: Diagnosis not present

## 2018-01-26 DIAGNOSIS — M5417 Radiculopathy, lumbosacral region: Secondary | ICD-10-CM | POA: Diagnosis not present

## 2018-01-26 DIAGNOSIS — D649 Anemia, unspecified: Secondary | ICD-10-CM | POA: Diagnosis not present

## 2018-01-26 DIAGNOSIS — E7849 Other hyperlipidemia: Secondary | ICD-10-CM | POA: Diagnosis not present

## 2018-01-27 ENCOUNTER — Other Ambulatory Visit: Payer: Self-pay | Admitting: Internal Medicine

## 2018-01-27 DIAGNOSIS — M5416 Radiculopathy, lumbar region: Secondary | ICD-10-CM

## 2018-01-27 DIAGNOSIS — M5430 Sciatica, unspecified side: Secondary | ICD-10-CM

## 2018-01-31 DIAGNOSIS — Z1231 Encounter for screening mammogram for malignant neoplasm of breast: Secondary | ICD-10-CM | POA: Diagnosis not present

## 2018-01-31 DIAGNOSIS — Z803 Family history of malignant neoplasm of breast: Secondary | ICD-10-CM | POA: Diagnosis not present

## 2018-02-04 DIAGNOSIS — D649 Anemia, unspecified: Secondary | ICD-10-CM | POA: Diagnosis not present

## 2018-02-05 ENCOUNTER — Ambulatory Visit
Admission: RE | Admit: 2018-02-05 | Discharge: 2018-02-05 | Disposition: A | Discharge status: Home / Self Care | Payer: PPO | Source: Ambulatory Visit | Attending: Internal Medicine | Admitting: Internal Medicine

## 2018-02-05 DIAGNOSIS — M5430 Sciatica, unspecified side: Secondary | ICD-10-CM

## 2018-02-05 DIAGNOSIS — M5416 Radiculopathy, lumbar region: Secondary | ICD-10-CM | POA: Diagnosis not present

## 2018-03-02 ENCOUNTER — Other Ambulatory Visit: Payer: Self-pay | Admitting: Obstetrics and Gynecology

## 2018-03-14 DIAGNOSIS — E7849 Other hyperlipidemia: Secondary | ICD-10-CM | POA: Diagnosis not present

## 2018-03-14 DIAGNOSIS — K219 Gastro-esophageal reflux disease without esophagitis: Secondary | ICD-10-CM | POA: Diagnosis not present

## 2018-03-14 DIAGNOSIS — M7582 Other shoulder lesions, left shoulder: Secondary | ICD-10-CM | POA: Diagnosis not present

## 2018-03-14 DIAGNOSIS — D649 Anemia, unspecified: Secondary | ICD-10-CM | POA: Diagnosis not present

## 2018-03-14 DIAGNOSIS — M5430 Sciatica, unspecified side: Secondary | ICD-10-CM | POA: Diagnosis not present

## 2018-03-14 DIAGNOSIS — M5116 Intervertebral disc disorders with radiculopathy, lumbar region: Secondary | ICD-10-CM | POA: Diagnosis not present

## 2018-03-24 DIAGNOSIS — L538 Other specified erythematous conditions: Secondary | ICD-10-CM | POA: Diagnosis not present

## 2018-03-24 DIAGNOSIS — B078 Other viral warts: Secondary | ICD-10-CM | POA: Diagnosis not present

## 2018-03-24 DIAGNOSIS — D2239 Melanocytic nevi of other parts of face: Secondary | ICD-10-CM | POA: Diagnosis not present

## 2018-03-28 DIAGNOSIS — M5416 Radiculopathy, lumbar region: Secondary | ICD-10-CM | POA: Diagnosis not present

## 2018-03-28 DIAGNOSIS — M5136 Other intervertebral disc degeneration, lumbar region: Secondary | ICD-10-CM | POA: Diagnosis not present

## 2018-04-14 DIAGNOSIS — D649 Anemia, unspecified: Secondary | ICD-10-CM | POA: Diagnosis not present

## 2018-06-07 DIAGNOSIS — R5383 Other fatigue: Secondary | ICD-10-CM | POA: Diagnosis not present

## 2018-06-07 DIAGNOSIS — R5381 Other malaise: Secondary | ICD-10-CM | POA: Diagnosis not present

## 2018-06-07 DIAGNOSIS — R21 Rash and other nonspecific skin eruption: Secondary | ICD-10-CM | POA: Diagnosis not present

## 2018-06-07 DIAGNOSIS — M791 Myalgia, unspecified site: Secondary | ICD-10-CM | POA: Diagnosis not present

## 2018-06-28 DIAGNOSIS — M5416 Radiculopathy, lumbar region: Secondary | ICD-10-CM | POA: Diagnosis not present

## 2018-06-28 DIAGNOSIS — M5136 Other intervertebral disc degeneration, lumbar region: Secondary | ICD-10-CM | POA: Diagnosis not present

## 2018-07-13 DIAGNOSIS — H93239 Hyperacusis, unspecified ear: Secondary | ICD-10-CM | POA: Diagnosis not present

## 2018-07-13 DIAGNOSIS — H9319 Tinnitus, unspecified ear: Secondary | ICD-10-CM | POA: Diagnosis not present

## 2018-07-13 DIAGNOSIS — R51 Headache: Secondary | ICD-10-CM | POA: Diagnosis not present

## 2018-07-13 DIAGNOSIS — H903 Sensorineural hearing loss, bilateral: Secondary | ICD-10-CM | POA: Diagnosis not present

## 2018-07-26 DIAGNOSIS — N814 Uterovaginal prolapse, unspecified: Secondary | ICD-10-CM | POA: Diagnosis not present

## 2018-07-26 DIAGNOSIS — K219 Gastro-esophageal reflux disease without esophagitis: Secondary | ICD-10-CM | POA: Diagnosis not present

## 2018-07-26 DIAGNOSIS — E7849 Other hyperlipidemia: Secondary | ICD-10-CM | POA: Diagnosis not present

## 2018-08-02 DIAGNOSIS — Z0000 Encounter for general adult medical examination without abnormal findings: Secondary | ICD-10-CM | POA: Diagnosis not present

## 2018-08-02 DIAGNOSIS — R51 Headache: Secondary | ICD-10-CM | POA: Diagnosis not present

## 2018-08-02 DIAGNOSIS — J8410 Pulmonary fibrosis, unspecified: Secondary | ICD-10-CM | POA: Diagnosis not present

## 2018-08-02 DIAGNOSIS — H93233 Hyperacusis, bilateral: Secondary | ICD-10-CM | POA: Diagnosis not present

## 2018-08-02 DIAGNOSIS — E7849 Other hyperlipidemia: Secondary | ICD-10-CM | POA: Diagnosis not present

## 2018-08-02 DIAGNOSIS — K219 Gastro-esophageal reflux disease without esophagitis: Secondary | ICD-10-CM | POA: Diagnosis not present

## 2018-08-05 DIAGNOSIS — M5416 Radiculopathy, lumbar region: Secondary | ICD-10-CM | POA: Diagnosis not present

## 2018-08-05 DIAGNOSIS — M5136 Other intervertebral disc degeneration, lumbar region: Secondary | ICD-10-CM | POA: Diagnosis not present

## 2018-08-22 ENCOUNTER — Telehealth: Payer: Self-pay | Admitting: Obstetrics and Gynecology

## 2018-08-22 NOTE — Telephone Encounter (Signed)
The patient called and stated that she needs a medication refill of estradiol (ESTRACE) 1 MG tablet, The patient also state that she would like to make sure that the quantity is 90 tablets. Please advise.

## 2018-08-22 NOTE — Telephone Encounter (Signed)
Pt aware 02/2018 90 day supply erx. Advised pt to contact pharmacy.

## 2018-08-26 DIAGNOSIS — M5136 Other intervertebral disc degeneration, lumbar region: Secondary | ICD-10-CM | POA: Diagnosis not present

## 2018-08-26 DIAGNOSIS — M5416 Radiculopathy, lumbar region: Secondary | ICD-10-CM | POA: Diagnosis not present

## 2018-09-01 DIAGNOSIS — H93239 Hyperacusis, unspecified ear: Secondary | ICD-10-CM | POA: Diagnosis not present

## 2018-09-01 DIAGNOSIS — H6980 Other specified disorders of Eustachian tube, unspecified ear: Secondary | ICD-10-CM | POA: Diagnosis not present

## 2018-09-13 DIAGNOSIS — M5416 Radiculopathy, lumbar region: Secondary | ICD-10-CM | POA: Diagnosis not present

## 2018-09-13 DIAGNOSIS — M5136 Other intervertebral disc degeneration, lumbar region: Secondary | ICD-10-CM | POA: Diagnosis not present

## 2018-09-29 DIAGNOSIS — D225 Melanocytic nevi of trunk: Secondary | ICD-10-CM | POA: Diagnosis not present

## 2018-09-29 DIAGNOSIS — Z85828 Personal history of other malignant neoplasm of skin: Secondary | ICD-10-CM | POA: Diagnosis not present

## 2018-09-29 DIAGNOSIS — X32XXXA Exposure to sunlight, initial encounter: Secondary | ICD-10-CM | POA: Diagnosis not present

## 2018-09-29 DIAGNOSIS — D2261 Melanocytic nevi of right upper limb, including shoulder: Secondary | ICD-10-CM | POA: Diagnosis not present

## 2018-09-29 DIAGNOSIS — D2262 Melanocytic nevi of left upper limb, including shoulder: Secondary | ICD-10-CM | POA: Diagnosis not present

## 2018-09-29 DIAGNOSIS — L570 Actinic keratosis: Secondary | ICD-10-CM | POA: Diagnosis not present

## 2018-09-29 DIAGNOSIS — D2272 Melanocytic nevi of left lower limb, including hip: Secondary | ICD-10-CM | POA: Diagnosis not present

## 2018-09-29 DIAGNOSIS — D2271 Melanocytic nevi of right lower limb, including hip: Secondary | ICD-10-CM | POA: Diagnosis not present

## 2018-09-29 DIAGNOSIS — Z08 Encounter for follow-up examination after completed treatment for malignant neoplasm: Secondary | ICD-10-CM | POA: Diagnosis not present

## 2018-09-29 DIAGNOSIS — L821 Other seborrheic keratosis: Secondary | ICD-10-CM | POA: Diagnosis not present

## 2018-12-20 DIAGNOSIS — H43813 Vitreous degeneration, bilateral: Secondary | ICD-10-CM | POA: Diagnosis not present

## 2018-12-21 ENCOUNTER — Encounter: Payer: Self-pay | Admitting: Obstetrics and Gynecology

## 2018-12-21 ENCOUNTER — Ambulatory Visit (INDEPENDENT_AMBULATORY_CARE_PROVIDER_SITE_OTHER): Payer: PPO | Admitting: Obstetrics and Gynecology

## 2018-12-21 VITALS — BP 122/76 | HR 74 | Ht 62.0 in | Wt 113.7 lb

## 2018-12-21 DIAGNOSIS — N951 Menopausal and female climacteric states: Secondary | ICD-10-CM | POA: Diagnosis not present

## 2018-12-21 DIAGNOSIS — Z79890 Hormone replacement therapy (postmenopausal): Secondary | ICD-10-CM | POA: Diagnosis not present

## 2018-12-21 DIAGNOSIS — N8111 Cystocele, midline: Secondary | ICD-10-CM

## 2018-12-21 DIAGNOSIS — Z01419 Encounter for gynecological examination (general) (routine) without abnormal findings: Secondary | ICD-10-CM | POA: Diagnosis not present

## 2018-12-21 DIAGNOSIS — Z960 Presence of urogenital implants: Secondary | ICD-10-CM | POA: Diagnosis not present

## 2018-12-21 MED ORDER — ESTRADIOL 1 MG PO TABS: 1.0000 mg | ORAL_TABLET | Freq: Every day | ORAL | 3 refills | Status: DC

## 2018-12-21 MED ORDER — MEDROXYPROGESTERONE ACETATE 5 MG PO TABS: 5.0000 mg | ORAL_TABLET | Freq: Every day | ORAL | 9 refills | Status: DC

## 2018-12-21 NOTE — Patient Instructions (Addendum)
Health Maintenance for Postmenopausal Women Menopause is a normal process in which your reproductive ability comes to an end. This process happens gradually over a span of months to years, usually between the ages of 62 and 89. Menopause is complete when you have missed 12 consecutive menstrual periods. It is important to talk with your health care provider about some of the most common conditions that affect postmenopausal women, such as heart disease, cancer, and bone loss (osteoporosis). Adopting a healthy lifestyle and getting preventive care can help to promote your health and wellness. Those actions can also lower your chances of developing some of these common conditions. What should I know about menopause? During menopause, you may experience a number of symptoms, such as:  Moderate-to-severe hot flashes.  Night sweats.  Decrease in sex drive.  Mood swings.  Headaches.  Tiredness.  Irritability.  Memory problems.  Insomnia. Choosing to treat or not to treat menopausal changes is an individual decision that you make with your health care provider. What should I know about hormone replacement therapy and supplements? Hormone therapy products are effective for treating symptoms that are associated with menopause, such as hot flashes and night sweats. Hormone replacement carries certain risks, especially as you become older. If you are thinking about using estrogen or estrogen with progestin treatments, discuss the benefits and risks with your health care provider. What should I know about heart disease and stroke? Heart disease, heart attack, and stroke become more likely as you age. This may be due, in part, to the hormonal changes that your body experiences during menopause. These can affect how your body processes dietary fats, triglycerides, and cholesterol. Heart attack and stroke are both medical emergencies. There are many things that you can do to help prevent heart disease  and stroke:  Have your blood pressure checked at least every 1-2 years. High blood pressure causes heart disease and increases the risk of stroke.  If you are 79-72 years old, ask your health care provider if you should take aspirin to prevent a heart attack or a stroke.  Do not use any tobacco products, including cigarettes, chewing tobacco, or electronic cigarettes. If you need help quitting, ask your health care provider.  It is important to eat a healthy diet and maintain a healthy weight. ? Be sure to include plenty of vegetables, fruits, low-fat dairy products, and lean protein. ? Avoid eating foods that are high in solid fats, added sugars, or salt (sodium).  Get regular exercise. This is one of the most important things that you can do for your health. ? Try to exercise for at least 150 minutes each week. The type of exercise that you do should increase your heart rate and make you sweat. This is known as moderate-intensity exercise. ? Try to do strengthening exercises at least twice each week. Do these in addition to the moderate-intensity exercise.  Know your numbers.Ask your health care provider to check your cholesterol and your blood glucose. Continue to have your blood tested as directed by your health care provider.  What should I know about cancer screening? There are several types of cancer. Take the following steps to reduce your risk and to catch any cancer development as early as possible. Breast Cancer  Practice breast self-awareness. ? This means understanding how your breasts normally appear and feel. ? It also means doing regular breast self-exams. Let your health care provider know about any changes, no matter how small.  If you are 40 or  older, have a clinician do a breast exam (clinical breast exam or CBE) every year. Depending on your age, family history, and medical history, it may be recommended that you also have a yearly breast X-ray (mammogram).  If you  have a family history of breast cancer, talk with your health care provider about genetic screening.  If you are at high risk for breast cancer, talk with your health care provider about having an MRI and a mammogram every year.  Breast cancer (BRCA) gene test is recommended for women who have family members with BRCA-related cancers. Results of the assessment will determine the need for genetic counseling and BRCA1 and for BRCA2 testing. BRCA-related cancers include these types: ? Breast. This occurs in males or females. ? Ovarian. ? Tubal. This may also be called fallopian tube cancer. ? Cancer of the abdominal or pelvic lining (peritoneal cancer). ? Prostate. ? Pancreatic. Cervical, Uterine, and Ovarian Cancer Your health care provider may recommend that you be screened regularly for cancer of the pelvic organs. These include your ovaries, uterus, and vagina. This screening involves a pelvic exam, which includes checking for microscopic changes to the surface of your cervix (Pap test).  For women ages 21-65, health care providers may recommend a pelvic exam and a Pap test every three years. For women ages 39-65, they may recommend the Pap test and pelvic exam, combined with testing for human papilloma virus (HPV), every five years. Some types of HPV increase your risk of cervical cancer. Testing for HPV may also be done on women of any age who have unclear Pap test results.  Other health care providers may not recommend any screening for nonpregnant women who are considered low risk for pelvic cancer and have no symptoms. Ask your health care provider if a screening pelvic exam is right for you.  If you have had past treatment for cervical cancer or a condition that could lead to cancer, you need Pap tests and screening for cancer for at least 20 years after your treatment. If Pap tests have been discontinued for you, your risk factors (such as having a new sexual partner) need to be reassessed  to determine if you should start having screenings again. Some women have medical problems that increase the chance of getting cervical cancer. In these cases, your health care provider may recommend that you have screening and Pap tests more often.  If you have a family history of uterine cancer or ovarian cancer, talk with your health care provider about genetic screening.  If you have vaginal bleeding after reaching menopause, tell your health care provider.  There are currently no reliable tests available to screen for ovarian cancer. Lung Cancer Lung cancer screening is recommended for adults 57-50 years old who are at high risk for lung cancer because of a history of smoking. A yearly low-dose CT scan of the lungs is recommended if you:  Currently smoke.  Have a history of at least 30 pack-years of smoking and you currently smoke or have quit within the past 15 years. A pack-year is smoking an average of one pack of cigarettes per day for one year. Yearly screening should:  Continue until it has been 15 years since you quit.  Stop if you develop a health problem that would prevent you from having lung cancer treatment. Colorectal Cancer  This type of cancer can be detected and can often be prevented.  Routine colorectal cancer screening usually begins at age 12 and continues through  age 75.  If you have risk factors for colon cancer, your health care provider may recommend that you be screened at an earlier age.  If you have a family history of colorectal cancer, talk with your health care provider about genetic screening.  Your health care provider may also recommend using home test kits to check for hidden blood in your stool.  A small camera at the end of a tube can be used to examine your colon directly (sigmoidoscopy or colonoscopy). This is done to check for the earliest forms of colorectal cancer.  Direct examination of the colon should be repeated every 5-10 years until  age 75. However, if early forms of precancerous polyps or small growths are found or if you have a family history or genetic risk for colorectal cancer, you may need to be screened more often. Skin Cancer  Check your skin from head to toe regularly.  Monitor any moles. Be sure to tell your health care provider: ? About any new moles or changes in moles, especially if there is a change in a mole's shape or color. ? If you have a mole that is larger than the size of a pencil eraser.  If any of your family members has a history of skin cancer, especially at a young age, talk with your health care provider about genetic screening.  Always use sunscreen. Apply sunscreen liberally and repeatedly throughout the day.  Whenever you are outside, protect yourself by wearing long sleeves, pants, a wide-brimmed hat, and sunglasses. What should I know about osteoporosis? Osteoporosis is a condition in which bone destruction happens more quickly than new bone creation. After menopause, you may be at an increased risk for osteoporosis. To help prevent osteoporosis or the bone fractures that can happen because of osteoporosis, the following is recommended:  If you are 19-50 years old, get at least 1,000 mg of calcium and at least 600 mg of vitamin D per day.  If you are older than age 50 but younger than age 70, get at least 1,200 mg of calcium and at least 600 mg of vitamin D per day.  If you are older than age 70, get at least 1,200 mg of calcium and at least 800 mg of vitamin D per day. Smoking and excessive alcohol intake increase the risk of osteoporosis. Eat foods that are rich in calcium and vitamin D, and do weight-bearing exercises several times each week as directed by your health care provider. What should I know about how menopause affects my mental health? Depression may occur at any age, but it is more common as you become older. Common symptoms of depression include:  Low or sad  mood.  Changes in sleep patterns.  Changes in appetite or eating patterns.  Feeling an overall lack of motivation or enjoyment of activities that you previously enjoyed.  Frequent crying spells. Talk with your health care provider if you think that you are experiencing depression. What should I know about immunizations? It is important that you get and maintain your immunizations. These include:  Tetanus, diphtheria, and pertussis (Tdap) booster vaccine.  Influenza every year before the flu season begins.  Pneumonia vaccine.  Shingles vaccine. Your health care provider may also recommend other immunizations. This information is not intended to replace advice given to you by your health care provider. Make sure you discuss any questions you have with your health care provider. Document Released: 12/04/2005 Document Revised: 05/01/2016 Document Reviewed: 07/16/2015 Elsevier Interactive Patient Education    2019 [employer].  Breast Self-Awareness Breast self-awareness means:  Knowing how your breasts look.  Knowing how your breasts feel.  Checking your breasts every month for changes.  Telling your doctor if you notice a change in your breasts. Breast self-awareness allows you to notice a breast problem early while it is still small. How to do a breast self-exam One way to learn what is normal for your breasts and to check for changes is to do a breast self-exam. To do a breast self-exam: Look for Changes  1. Take off all the clothes above your waist. 2. Stand in front of a mirror in a room with good lighting. 3. Put your hands on your hips. 4. Push your hands down. 5. Look at your breasts and nipples in the mirror to see if one breast or nipple looks different than the other. Check to see if: ? The shape of one breast is different. ? The size of one breast is different. ? There are wrinkles, dips, and bumps in one breast and not the other. 6. Look at each breast for  changes in your skin, such as: ? Redness. ? Scaly areas. 7. Look for changes in your nipples, such as: ? Liquid around the nipples. ? Bleeding. ? Dimpling. ? Redness. ? A change in where the nipples are. Feel for Changes 1. Lie on your back on the floor. 2. Feel each breast. To do this, follow these steps: ? Pick a breast to feel. ? Put the arm closest to that breast above your head. ? Use your other arm to feel the nipple area of your breast. Feel the area with the pads of your three middle fingers by making small circles with your fingers. For the first circle, press lightly. For the second circle, press harder. For the third circle, press even harder. ? Keep making circles with your fingers at the light, harder, and even harder pressures as you move down your breast. Stop when you feel your ribs. ? Move your fingers a little toward the center of your body. ? Start making circles with your fingers again, this time going up until you reach your collarbone. ? Keep making up and down circles until you reach your armpit. Remember to keep using the three pressures. ? Feel the other breast in the same way. 3. Sit or stand in the shower or tub. 4. With soapy water on your skin, feel each breast the same way you did in step 2, when you were lying on the floor. Write Down What You Find After doing the self-exam, write down:  What is normal for each breast.  Any changes you find in each breast.  When you last had your period.  How often should I check my breasts? Check your breasts every month. If you are breastfeeding, the best time to check them is after you feed your baby or after you use a breast pump. If you get periods, the best time to check your breasts is 5-7 days after your period is over. When should I see my doctor? See your doctor if you notice:  A change in shape or size of your breasts or nipples.  A change in the skin of your breast or nipples, such as red or scaly  skin.  Unusual fluid coming from your nipples.  A lump or thick area that was not there before.  Pain in your breasts.  Anything that concerns you. This information is not intended to replace advice   given to you by your health care provider. Make sure you discuss any questions you have with your health care provider. Document Released: 03/30/2008 Document Revised: 03/19/2016 Document Reviewed: 09/01/2015 Elsevier Interactive Patient Education  2019 Elsevier Inc.  

## 2018-12-21 NOTE — Progress Notes (Signed)
PT is present today for her annual exam. Pt stated that she has been doing self-breast exams monthly. Pt stated that she is doing well and denies any issues. No problems or concerns.  Pt declined flu vaccine.

## 2018-12-21 NOTE — Progress Notes (Signed)
ANNUAL PREVENTATIVE CARE GYNECOLOGY  ENCOUNTER NOTE  Subjective:       Elizabeth Friedman is a 78 y.o. G31P2002 female here for a routine annual gynecologic exam. The patient is not sexually active. The patient is taking hormone replacement therapy. Patient denies post-menopausal vaginal bleeding. The patient wears seatbelts: yes. The patient participates in regular exercise: no. Has the patient ever been transfused or tattooed?: no. The patient reports that there is not domestic violence in her life.  Current complaints: 1.  None    Gynecologic History No LMP recorded. Patient is postmenopausal. Contraception: post menopausal status Last Pap: No further paps needed. Denies h/o abnormal pap smears Last mammogram: 02/18/2017. Results were: normal. Due in April, order placed by PCP.  Performs in [place].  Last Colonoscopy: 03/27/2016. Results were: Internal hemorrhoids. Repeat in 5 years. Last Dexa Scan: 2015. Osteopenia, with T score of -1.8.  PCP: Rosezella Rumpf at [facility]   Obstetric History OB History  Gravida Para Term Preterm AB Living  2 2 2     2   SAB TAB Ectopic Multiple Live Births          2    # Outcome Date GA Lbr Len/2nd Weight Sex Delivery Anes PTL Lv  2 Term 1964    F Vag-Spont   LIV  1 Term 1962    F Vag-Spont   LIV    Past Medical History:  Diagnosis Date  . Allergic rhinitis   . Arthritis   . Atypical chest pain   . Cancer ([place])    skin  . Fibrocystic breast disease   . GERD (gastroesophageal reflux disease)   . Hyperlipidemia   . Lung nodule   . Menopause   . Migraine   . Plantar fasciitis   . 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 Sciatic leg pain   . Sciatica   . Urinary retention with incomplete bladder emptying   . Vitreous detachment of both eyes     Family History  Problem Relation Age of Onset  . Breast cancer Mother   . Heart disease Father   . Colon cancer Brother   . Diabetes Neg Hx   . Ovarian cancer Neg Hx      Past Surgical History:  Procedure Laterality Date  . APPENDECTOMY    . CARDIAC CATHETERIZATION    . COLONOSCOPY WITH PROPOFOL N/A 03/27/2016   Procedure: COLONOSCOPY WITH PROPOFOL;  Surgeon: Manya Silvas, MD;  Location[facility] ENDOSCOPY;  Service: Endoscopy;  Laterality: N/A;  . SKIN CANCER EXCISION     face and neck    Social History   Socioeconomic History  . Marital status: Married    Spouse name: Not on file  . Number of children: Not on file  . Years of education: Not on file  . Highest education level: Not on file  Occupational History  . Not on file  Social Needs  . Financial resource strain: Not on file  . Food insecurity:    Worry: Not on file    Inability: Not on file  . Transportation needs:    Medical: Not on file    Non-medical: Not on file  Tobacco Use  . Smoking status: Never Smoker  . Smokeless tobacco: Never Used  Substance and Sexual Activity  . Alcohol use: No  . Drug use: No  . Sexual activity: Not Currently    Birth control/protection: Post-menopausal  Lifestyle  . Physical activity:  Days per week: 3 days    Minutes per session: 60 min  . Stress: Not on file  Relationships  . Social connections:    Talks on phone: Not on file    Gets together: Not on file    Attends religious service: Not on file    Active member of club or organization: Not on file    Attends meetings of clubs or organizations: Not on file    Relationship status: Not on file  . Intimate partner violence:    Fear of current or ex partner: Not on file    Emotionally abused: Not on file    Physically abused: Not on file    Forced sexual activity: Not on file  Other Topics Concern  . Not on file  Social History Narrative  . Not on file    Current Outpatient Medications on File Prior to Visit  Medication Sig Dispense Refill  . acetaminophen (TYLENOL) 500 MG tablet Take 500 mg by mouth every 6 (six) hours as needed.    Marland Kitchen aspirin 325 MG EC tablet Take 325 mg by  mouth daily.    . Calcium Acetate-Magnesium Carb (MAGNEBIND 200 PO) Take by mouth.    . calcium carbonate 1250 MG capsule Take 1,250 mg by mouth 2 (two) times daily with a meal.    . celecoxib (CELEBREX) 200 MG capsule Take 200 mg by mouth 2 (two) times daily.    . cholecalciferol (VITAMIN D) 1000 units tablet Take 1,000 Units by mouth daily.    Marland Kitchen co-enzyme Q-10 30 MG capsule Take 30 mg by mouth 3 (three) times daily.    Marland Kitchen estradiol (ESTRACE) 1 MG tablet TAKE 1 TABLET BY MOUTH ONCE DAILY 90 tablet 3  . medroxyPROGESTERone (PROVERA) 5 MG tablet Take 1 tablet (5 mg total) by mouth daily. Days 1-12 36 tablet 3  . Omega-3 Fatty Acids (FISH OIL) 1000 MG CAPS Take by mouth.    Marland Kitchen omeprazole (PRILOSEC) 20 MG capsule Take 20 mg by mouth daily.    . RESTASIS 0.05 % ophthalmic emulsion     . simvastatin (ZOCOR) 10 MG tablet Take 10 mg by mouth daily.     No current facility-administered medications on file prior to visit.     No Known Allergies    Review of Systems ROS Review of Systems - General ROS: negative for - chills, fatigue, fever, hot flashes, night sweats, weight gain or weight loss Psychological ROS: negative for - anxiety, decreased libido, depression, mood swings, physical abuse or sexual abuse Ophthalmic ROS: negative for - blurry vision, eye pain or loss of vision ENT ROS: negative for - headaches, hearing change, visual changes or vocal changes Allergy and Immunology ROS: negative for - hives, itchy/watery eyes or seasonal allergies Hematological and Lymphatic ROS: negative for - bleeding problems, bruising, swollen lymph nodes or weight loss Endocrine ROS: negative for - galactorrhea, hair pattern changes, hot flashes, malaise/lethargy, mood swings, palpitations, polydipsia/polyuria, skin changes, temperature intolerance or unexpected weight changes Breast ROS: negative for - new or changing breast lumps or nipple discharge Respiratory ROS: negative for - cough or shortness of  breath Cardiovascular ROS: negative for - chest pain, irregular heartbeat, palpitations or shortness of breath Gastrointestinal ROS: no abdominal pain, change in bowel habits, or black or bloody stools Genito-Urinary ROS: no dysuria, trouble voiding, or hematuria Musculoskeletal ROS: negative for - joint pain or joint stiffness Neurological ROS: negative for - bowel and bladder control changes Dermatological ROS: negative for rash  and skin lesion changes   Objective:   BP 122/76   Pulse 74   Ht 5\' 2"  (1.575 m)   Wt 113 lb 11.2 oz (51.6 kg)   BMI 20.80 kg/m  CONSTITUTIONAL: Well-developed, well-nourished female in no acute distress.  PSYCHIATRIC: Normal mood and affect. Normal behavior. Normal judgment and thought content. [place]: Alert and oriented to person, place, and time. Normal muscle tone coordination. No cranial nerve deficit noted. HENT:  Normocephalic, atraumatic, External right and left ear normal. Oropharynx is clear and moist EYES: Conjunctivae and EOM are normal. Pupils are equal, round, and reactive to light. No scleral icterus.  NECK: Normal range of motion, supple, no masses.  Normal thyroid.  SKIN: Skin is warm and dry. No rash noted. Not diaphoretic. No erythema. No pallor. CARDIOVASCULAR: Normal heart rate noted, regular rhythm, no murmur. RESPIRATORY: Clear to auscultation bilaterally. Effort and breath sounds normal, no problems with respiration noted. BREASTS: Symmetric in size. No masses, skin changes, nipple drainage, or lymphadenopathy. ABDOMEN: Soft, normal bowel sounds, no distention noted.  No tenderness, rebound or guarding.  BLADDER: Normal PELVIC:  Bladder no bladder distension noted  Urethra: normal appearing urethra with no masses, tenderness or lesions  Vulva: normal appearing vulva with no masses, tenderness or lesions  Vagina: normal appearing vagina with normal color and discharge, no lesions and atrophic (mild).  Pessary in place (has been in  place for "many years". Has had pessary in for over 50 years. Has not had it removed in several years.  Cervix: normal appearing cervix without discharge or lesions, slightly flushed to vaginal wall.   Uterus: uterus is normal size, shape, consistency and nontender  Adnexa: normal adnexa in size, nontender and no masses  RV: External Exam NormaI, No Rectal Masses and Normal Sphincter tone  MUSCULOSKELETAL: Normal range of motion. No tenderness.  No cyanosis, clubbing, or edema.  2+ distal pulses. LYMPHATIC: No Axillary, Supraclavicular, or Inguinal Adenopathy.   Labs: No results found for: WBC, HGB, HCT, MCV, PLT  No results found for: CREATININE, BUN, NA, K, CL, CO2  No results found for: ALT, AST, GGT, ALKPHOS, BILITOT  No results found for: CHOL, HDL, LDLCALC, LDLDIRECT, TRIG, CHOLHDL  No results found for: TSH  No results found for: HGBA1C   Assessment:   Encounter for well woman exam with routine gynecological exam Menopausal syndrome on hormone replacement therapy Midline cystocele Pessay in place  Plan:  Pap: Not needed.  Beyond age for screening.  Mammogram: Ordered by PCP. Due in April Stool Guaiac Testing:  Not Ordered.  Patient up to date on colonoscopy. Due for repeat in 2022.  Labs: None ordered. Due for labs in April with PCP Routine preventative health maintenance measures emphasized: Exercise/Diet/Weight control, Tobacco Warnings, Alcohol/Substance use risks and Stress Management Declines flu vaccine.  Desires refill on HRT for menopause.  Understands risks of continued use fo HRT beyond 5 years. Notes attempts at weaning in the past that were unsuccessful. Given refill.  Return to [place], MD [facility]

## 2018-12-22 ENCOUNTER — Encounter: Payer: PPO | Admitting: Obstetrics and Gynecology

## 2018-12-23 ENCOUNTER — Encounter: Payer: PPO | Admitting: Obstetrics and Gynecology

## 2018-12-25 ENCOUNTER — Encounter: Payer: Self-pay | Admitting: Obstetrics and Gynecology

## 2018-12-27 ENCOUNTER — Encounter: Payer: PPO | Admitting: Obstetrics and Gynecology

## 2019-01-26 DIAGNOSIS — K219 Gastro-esophageal reflux disease without esophagitis: Secondary | ICD-10-CM | POA: Diagnosis not present

## 2019-01-26 DIAGNOSIS — E7849 Other hyperlipidemia: Secondary | ICD-10-CM | POA: Diagnosis not present

## 2019-01-26 DIAGNOSIS — R311 Benign essential microscopic hematuria: Secondary | ICD-10-CM | POA: Diagnosis not present

## 2019-01-26 DIAGNOSIS — Z0000 Encounter for general adult medical examination without abnormal findings: Secondary | ICD-10-CM | POA: Diagnosis not present

## 2019-02-07 DIAGNOSIS — J8410 Pulmonary fibrosis, unspecified: Secondary | ICD-10-CM | POA: Diagnosis not present

## 2019-02-07 DIAGNOSIS — E785 Hyperlipidemia, unspecified: Secondary | ICD-10-CM | POA: Diagnosis not present

## 2019-02-07 DIAGNOSIS — K219 Gastro-esophageal reflux disease without esophagitis: Secondary | ICD-10-CM | POA: Diagnosis not present

## 2019-02-07 DIAGNOSIS — M7582 Other shoulder lesions, left shoulder: Secondary | ICD-10-CM | POA: Diagnosis not present

## 2019-02-07 DIAGNOSIS — G5793 Unspecified mononeuropathy of bilateral lower limbs: Secondary | ICD-10-CM | POA: Diagnosis not present

## 2019-02-28 DIAGNOSIS — H93239 Hyperacusis, unspecified ear: Secondary | ICD-10-CM | POA: Diagnosis not present

## 2019-02-28 DIAGNOSIS — H9319 Tinnitus, unspecified ear: Secondary | ICD-10-CM | POA: Diagnosis not present

## 2019-02-28 DIAGNOSIS — G43909 Migraine, unspecified, not intractable, without status migrainosus: Secondary | ICD-10-CM | POA: Diagnosis not present

## 2019-02-28 DIAGNOSIS — H903 Sensorineural hearing loss, bilateral: Secondary | ICD-10-CM | POA: Diagnosis not present

## 2019-02-28 DIAGNOSIS — H6980 Other specified disorders of Eustachian tube, unspecified ear: Secondary | ICD-10-CM | POA: Diagnosis not present

## 2019-03-07 DIAGNOSIS — G43109 Migraine with aura, not intractable, without status migrainosus: Secondary | ICD-10-CM | POA: Insufficient documentation

## 2019-04-11 DIAGNOSIS — Z803 Family history of malignant neoplasm of breast: Secondary | ICD-10-CM | POA: Diagnosis not present

## 2019-04-11 DIAGNOSIS — Z1231 Encounter for screening mammogram for malignant neoplasm of breast: Secondary | ICD-10-CM | POA: Diagnosis not present

## 2019-08-02 DIAGNOSIS — E785 Hyperlipidemia, unspecified: Secondary | ICD-10-CM | POA: Diagnosis not present

## 2019-08-02 DIAGNOSIS — G5793 Unspecified mononeuropathy of bilateral lower limbs: Secondary | ICD-10-CM | POA: Diagnosis not present

## 2019-08-02 DIAGNOSIS — K219 Gastro-esophageal reflux disease without esophagitis: Secondary | ICD-10-CM | POA: Diagnosis not present

## 2019-08-09 ENCOUNTER — Other Ambulatory Visit: Payer: Self-pay | Admitting: Internal Medicine

## 2019-08-09 ENCOUNTER — Other Ambulatory Visit: Payer: Self-pay | Admitting: Orthopedic Surgery

## 2019-08-09 DIAGNOSIS — Z23 Encounter for immunization: Secondary | ICD-10-CM | POA: Diagnosis not present

## 2019-08-09 DIAGNOSIS — Z780 Asymptomatic menopausal state: Secondary | ICD-10-CM | POA: Diagnosis not present

## 2019-08-09 DIAGNOSIS — J8410 Pulmonary fibrosis, unspecified: Secondary | ICD-10-CM | POA: Diagnosis not present

## 2019-08-09 DIAGNOSIS — G4489 Other headache syndrome: Secondary | ICD-10-CM

## 2019-08-09 DIAGNOSIS — K219 Gastro-esophageal reflux disease without esophagitis: Secondary | ICD-10-CM | POA: Diagnosis not present

## 2019-08-09 DIAGNOSIS — Z0000 Encounter for general adult medical examination without abnormal findings: Secondary | ICD-10-CM | POA: Diagnosis not present

## 2019-08-09 DIAGNOSIS — Z1159 Encounter for screening for other viral diseases: Secondary | ICD-10-CM | POA: Diagnosis not present

## 2019-08-09 DIAGNOSIS — E785 Hyperlipidemia, unspecified: Secondary | ICD-10-CM | POA: Diagnosis not present

## 2019-08-09 DIAGNOSIS — M25551 Pain in right hip: Secondary | ICD-10-CM

## 2019-08-09 DIAGNOSIS — J3089 Other allergic rhinitis: Secondary | ICD-10-CM | POA: Diagnosis not present

## 2019-08-09 DIAGNOSIS — M5430 Sciatica, unspecified side: Secondary | ICD-10-CM | POA: Diagnosis not present

## 2019-08-09 DIAGNOSIS — M5441 Lumbago with sciatica, right side: Secondary | ICD-10-CM

## 2019-08-15 DIAGNOSIS — M8588 Other specified disorders of bone density and structure, other site: Secondary | ICD-10-CM | POA: Diagnosis not present

## 2019-08-25 ENCOUNTER — Ambulatory Visit
Admission: RE | Admit: 2019-08-25 | Discharge: 2019-08-25 | Disposition: A | Discharge status: Home / Self Care | Payer: PPO | Source: Ambulatory Visit | Attending: Internal Medicine | Admitting: Internal Medicine

## 2019-08-25 ENCOUNTER — Other Ambulatory Visit: Payer: Self-pay

## 2019-08-25 DIAGNOSIS — G43909 Migraine, unspecified, not intractable, without status migrainosus: Secondary | ICD-10-CM | POA: Diagnosis not present

## 2019-08-25 DIAGNOSIS — G4489 Other headache syndrome: Secondary | ICD-10-CM

## 2019-08-25 MED ORDER — GADOBUTROL 1 MMOL/ML IV SOLN
5.0000 mL | Freq: Once | INTRAVENOUS | Status: AC | PRN
  Administered 2019-08-25: 5 mL via INTRAVENOUS

## 2019-09-26 ENCOUNTER — Other Ambulatory Visit: Payer: Self-pay

## 2019-09-26 DIAGNOSIS — Z20822 Contact with and (suspected) exposure to covid-19: Secondary | ICD-10-CM

## 2019-09-28 LAB — NOVEL CORONAVIRUS, NAA: SARS-CoV-2, NAA: NOT DETECTED

## 2019-10-23 DIAGNOSIS — D2262 Melanocytic nevi of left upper limb, including shoulder: Secondary | ICD-10-CM | POA: Diagnosis not present

## 2019-10-23 DIAGNOSIS — L821 Other seborrheic keratosis: Secondary | ICD-10-CM | POA: Diagnosis not present

## 2019-10-23 DIAGNOSIS — Z08 Encounter for follow-up examination after completed treatment for malignant neoplasm: Secondary | ICD-10-CM | POA: Diagnosis not present

## 2019-10-23 DIAGNOSIS — Z85828 Personal history of other malignant neoplasm of skin: Secondary | ICD-10-CM | POA: Diagnosis not present

## 2019-10-23 DIAGNOSIS — D225 Melanocytic nevi of trunk: Secondary | ICD-10-CM | POA: Diagnosis not present

## 2019-12-22 ENCOUNTER — Other Ambulatory Visit: Payer: Self-pay | Admitting: Ophthalmology

## 2019-12-22 DIAGNOSIS — G453 Amaurosis fugax: Secondary | ICD-10-CM

## 2019-12-26 ENCOUNTER — Ambulatory Visit (INDEPENDENT_AMBULATORY_CARE_PROVIDER_SITE_OTHER): Payer: PPO | Admitting: Obstetrics and Gynecology

## 2019-12-26 ENCOUNTER — Encounter: Payer: Self-pay | Admitting: Obstetrics and Gynecology

## 2019-12-26 ENCOUNTER — Other Ambulatory Visit: Payer: Self-pay

## 2019-12-26 VITALS — BP 151/65 | HR 70 | Ht 62.0 in | Wt 107.6 lb

## 2019-12-26 DIAGNOSIS — N816 Rectocele: Secondary | ICD-10-CM | POA: Diagnosis not present

## 2019-12-26 DIAGNOSIS — N8111 Cystocele, midline: Secondary | ICD-10-CM | POA: Diagnosis not present

## 2019-12-26 DIAGNOSIS — Z01419 Encounter for gynecological examination (general) (routine) without abnormal findings: Secondary | ICD-10-CM

## 2019-12-26 DIAGNOSIS — N951 Menopausal and female climacteric states: Secondary | ICD-10-CM | POA: Diagnosis not present

## 2019-12-26 DIAGNOSIS — Z960 Presence of urogenital implants: Secondary | ICD-10-CM

## 2019-12-26 DIAGNOSIS — Z79890 Hormone replacement therapy (postmenopausal): Secondary | ICD-10-CM

## 2019-12-26 MED ORDER — ESTRADIOL 1 MG PO TABS: 1.0000 mg | ORAL_TABLET | Freq: Every day | ORAL | 3 refills | Status: DC

## 2019-12-26 NOTE — Progress Notes (Signed)
ANNUAL PREVENTATIVE CARE GYNECOLOGY  ENCOUNTER NOTE  Subjective:       Elizabeth Friedman is a 79 y.o. [phone] menopausal female here for a routine annual gynecologic exam. The patient is not sexually active. The patient is taking hormone replacement therapy. Patient denies post-menopausal vaginal bleeding. The patient wears seatbelts: yes. The patient participates in regular exercise: no. Has the patient ever been transfused or tattooed?: no. The patient reports that there is not domestic violence in her life.  Current complaints: 1.  None    Gynecologic History No LMP recorded. Patient is postmenopausal. Contraception: post menopausal status Last Pap: No further paps needed. Denies h/o abnormal pap smears Last mammogram: 01/2019 per patient . Results were: normal. Due in April, order placed by PCP.  Performs in [place].  Last Colonoscopy: 03/27/2016. Results were: Internal hemorrhoids. Repeat in 5 years. Last Dexa Scan: 2015. Osteopenia, with T score of -1.8.  PCP: Rosezella Rumpf at [facility].    Obstetric History OB History  Gravida Para Term Preterm AB Living  2 2 2     2   SAB TAB Ectopic Multiple Live Births          2    # Outcome Date GA Lbr Len/2nd Weight Sex Delivery Anes PTL Lv  2 Term 1964    F Vag-Spont   LIV  1 Term 1962    F Vag-Spont   LIV    Past Medical History:  Diagnosis Date  . Allergic rhinitis   . Arthritis   . Atypical chest pain   . Cancer ([place])    skin  . Fibrocystic breast disease   . GERD (gastroesophageal reflux disease)   . Hyperlipidemia   . Lung nodule   . Menopause   . Migraine   . Migraine    optical migraines  . Plantar fasciitis   . 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 Sciatic leg pain   . Sciatica   . Urinary retention with incomplete bladder emptying   . Vitreous detachment of both eyes     Family History  Problem Relation Age of Onset  . Breast cancer Mother   . Heart disease Father   . Colon  cancer Brother   . Diabetes Neg Hx   . Ovarian cancer Neg Hx     Past Surgical History:  Procedure Laterality Date  . APPENDECTOMY    . CARDIAC CATHETERIZATION    . COLONOSCOPY WITH PROPOFOL N/A 03/27/2016   Procedure: COLONOSCOPY WITH PROPOFOL;  Surgeon: Manya Silvas, MD;  Location: [facility] ENDOSCOPY;  Service: Endoscopy;  Laterality: N/A;  . SKIN CANCER EXCISION     face and neck    Social History   Socioeconomic History  . Marital status: Married    Spouse name: Not on file  . Number of children: Not on file  . Years of education: Not on file  . Highest education level: Not on file  Occupational History  . Not on file  Tobacco Use  . Smoking status: Never Smoker  . Smokeless tobacco: Never Used  Substance and Sexual Activity  . Alcohol use: No  . Drug use: No  . Sexual activity: Not Currently    Birth control/protection: Post-menopausal  Other Topics Concern  . Not on file  Social History Narrative  . Not on file   Social Determinants of Health   Financial Resource Strain:   . Difficulty of Paying Living Expenses: Not on file  Food Insecurity:   . Worried About Charity fundraiser in the Last Year: Not on file  . Ran Out of Food in the Last Year: Not on file  Transportation Needs:   . Lack of Transportation (Medical): Not on file  . Lack of Transportation (Non-Medical): Not on file  Physical Activity:   . Days of Exercise per Week: Not on file  . Minutes of Exercise per Session: Not on file  Stress:   . Feeling of Stress : Not on file  Social Connections:   . Frequency of Communication with Friends and Family: Not on file  . Frequency of Social Gatherings with Friends and Family: Not on file  . Attends Religious Services: Not on file  . Active Member of Clubs or Organizations: Not on file  . Attends Archivist Meetings: Not on file  . Marital Status: Not on file  Intimate Partner Violence:   . Fear of Current or Ex-Partner: Not on file  .  Emotionally Abused: Not on file  . Physically Abused: Not on file  . Sexually Abused: Not on file    Current Outpatient Medications on File Prior to Visit  Medication Sig Dispense Refill  . acetaminophen (TYLENOL) 500 MG tablet Take 500 mg by mouth every 6 (six) hours as needed.    Marland Kitchen aspirin EC 81 MG tablet Take 81 mg by mouth daily.    . Calcium Acetate-Magnesium Carb (MAGNEBIND 200 PO) Take by mouth.    . calcium carbonate 1250 MG capsule Take 1,250 mg by mouth 2 (two) times daily with a meal.    . celecoxib (CELEBREX) 200 MG capsule Take 200 mg by mouth 2 (two) times daily.    . cholecalciferol (VITAMIN D) 1000 units tablet Take 1,000 Units by mouth daily.    Marland Kitchen co-enzyme Q-10 30 MG capsule Take 30 mg by mouth 3 (three) times daily.    Marland Kitchen estradiol (ESTRACE) 1 MG tablet Take 1 tablet (1 mg total) by mouth daily. 90 tablet 3  . gabapentin (NEURONTIN) 100 MG capsule Take 100 mg by mouth 3 (three) times daily.    . medroxyPROGESTERone (PROVERA) 5 MG tablet Take 1 tablet (5 mg total) by mouth daily. Days 1-12 36 tablet 9  . Omega-3 Fatty Acids (FISH OIL) 1000 MG CAPS Take by mouth.    Marland Kitchen omeprazole (PRILOSEC) 20 MG capsule Take 20 mg by mouth daily.    . RESTASIS 0.05 % ophthalmic emulsion     . simvastatin (ZOCOR) 10 MG tablet Take 10 mg by mouth daily.     No current facility-administered medications on file prior to visit.    No Known Allergies    Review of Systems ROS Review of Systems - General ROS: negative for - chills, fatigue, fever, hot flashes, night sweats, weight gain or weight loss Psychological ROS: negative for - anxiety, decreased libido, depression, mood swings, physical abuse or sexual abuse Ophthalmic ROS: negative for - blurry vision, eye pain or loss of vision ENT ROS: positive for - headaches and "eye issues" (silent migraines?, going for carotid study in 2 days for further evaluation). Negative for hearing change, or vocal changes Allergy and Immunology ROS:  negative for - hives, itchy/watery eyes or seasonal allergies Hematological and Lymphatic ROS: negative for - bleeding problems, bruising, swollen lymph nodes or weight loss Endocrine ROS: negative for - galactorrhea, hair pattern changes, hot flashes, malaise/lethargy, mood swings, palpitations, polydipsia/polyuria, skin changes, temperature intolerance or unexpected weight changes Breast ROS: negative  for - new or changing breast lumps or nipple discharge Respiratory ROS: negative for - cough or shortness of breath Cardiovascular ROS: negative for - chest pain, irregular heartbeat, palpitations or shortness of breath Gastrointestinal ROS: no abdominal pain, change in bowel habits, or black or bloody stools Genito-Urinary ROS: no dysuria, trouble voiding, or hematuria Musculoskeletal ROS: negative for - joint pain or joint stiffness Neurological ROS: negative for - bowel and bladder control changes Dermatological ROS: negative for rash and skin lesion changes   Objective:   BP (!) 151/65   Pulse 70   Ht 5\' 2"  (1.575 m)   Wt 107 lb 9.6 oz (48.8 kg)   BMI 19.68 kg/m  CONSTITUTIONAL: Well-developed, well-nourished female in no acute distress.  PSYCHIATRIC: Normal mood and affect. Normal behavior. Normal judgment and thought content. [place]: Alert and oriented to person, place, and time. Normal muscle tone coordination. No cranial nerve deficit noted. HENT:  Normocephalic, atraumatic, External right and left ear normal. Oropharynx is clear and moist EYES: Conjunctivae and EOM are normal. Pupils are equal, round, and reactive to light. No scleral icterus.  NECK: Normal range of motion, supple, no masses.  Normal thyroid.  SKIN: Skin is warm and dry. No rash noted. Not diaphoretic. No erythema. No pallor. CARDIOVASCULAR: Normal heart rate noted, regular rhythm, no murmur. RESPIRATORY: Clear to auscultation bilaterally. Effort and breath sounds normal, no problems with respiration  noted. BREASTS: Symmetric in size. No masses, skin changes, nipple drainage, or lymphadenopathy. ABDOMEN: Soft, normal bowel sounds, no distention noted.  No tenderness, rebound or guarding.  BLADDER: Normal PELVIC:  Bladder no bladder distension noted  Urethra: normal appearing urethra with no masses, tenderness or lesions  Vulva: normal appearing vulva with no masses, tenderness or lesions  Vagina: normal appearing vagina with normal color and discharge, no lesions and atrophic (mild).  Gehrung pessary in place (has been in place for "many years". Has had pessary in for over 50 years. Has not had it removed in several years.  Cervix: normal appearing cervix without discharge or lesions, slightly flushed to vaginal wall.   Uterus: uterus is normal size, shape, consistency and nontender  Adnexa: normal adnexa in size, nontender and no masses  RV: External Exam NormaI, No Rectal Masses and Normal Sphincter tone  MUSCULOSKELETAL: Normal range of motion. No tenderness.  No cyanosis, clubbing, or edema.  2+ distal pulses. LYMPHATIC: No Axillary, Supraclavicular, or Inguinal Adenopathy.   Labs: No results found for: WBC, HGB, HCT, MCV, PLT  No results found for: CREATININE, BUN, NA, K, CL, CO2  No results found for: ALT, AST, GGT, ALKPHOS, BILITOT  No results found for: CHOL, HDL, LDLCALC, LDLDIRECT, TRIG, CHOLHDL  No results found for: TSH  No results found for: HGBA1C   Assessment:   Encounter for well woman exam with routine gynecological exam Menopausal syndrome on hormone replacement therapy Cystocele with rectocele Pessay in place  Plan:  Pap: Not needed.  Beyond age for screening.  Mammogram: Ordered by PCP. Due in April Stool Guaiac Testing:  Not Ordered.  Patient up to date on colonoscopy. Due for repeat in 2022.  Labs: None ordered. Due for labs in April with PCP Routine preventative health maintenance measures emphasized: Exercise/Diet/Weight control, Tobacco  Warnings, Alcohol/Substance use risks and Stress Management Up to date on flu vaccine.  Desires refill on HRT for menopause.  Reiterated risks of continued use fo HRT beyond 5 years. Notes attempts at weaning in the past that were unsuccessful. Given refill.  Pessary in place for pelvic floor prolapse, unable to remove, has been in place for many years. No evidence of lesions or erosions. Continue to check annually.  Has received complete COVID vaccination series.  Return to [place], MD [facility]

## 2019-12-26 NOTE — Progress Notes (Signed)
Pt present for annual exam. Pt stated that she is doing well no problems. Flu vaccine completed 08/09/19.

## 2019-12-26 NOTE — Patient Instructions (Signed)
Health Maintenance for Postmenopausal Women Menopause is a normal process in which your ability to get pregnant comes to an end. This process happens slowly over many months or years, usually between the ages of 48 and 55. Menopause is complete when you have missed your menstrual periods for 12 months. It is important to talk with your health care provider about some of the most common conditions that affect women after menopause (postmenopausal women). These include heart disease, cancer, and bone loss (osteoporosis). Adopting a healthy lifestyle and getting preventive care can help to promote your health and wellness. The actions you take can also lower your chances of developing some of these common conditions. What should I know about menopause? During menopause, you may get a number of symptoms, such as:  Hot flashes. These can be moderate or severe.  Night sweats.  Decrease in sex drive.  Mood swings.  Headaches.  Tiredness.  Irritability.  Memory problems.  Insomnia. Choosing to treat or not to treat these symptoms is a decision that you make with your health care provider. Do I need hormone replacement therapy?  Hormone replacement therapy is effective in treating symptoms that are caused by menopause, such as hot flashes and night sweats.  Hormone replacement carries certain risks, especially as you become older. If you are thinking about using estrogen or estrogen with progestin, discuss the benefits and risks with your health care provider. What is my risk for heart disease and stroke? The risk of heart disease, heart attack, and stroke increases as you age. One of the causes may be a change in the body's hormones during menopause. This can affect how your body uses dietary fats, triglycerides, and cholesterol. Heart attack and stroke are medical emergencies. There are many things that you can do to help prevent heart disease and stroke. Watch your blood pressure  High  blood pressure causes heart disease and increases the risk of stroke. This is more likely to develop in people who have high blood pressure readings, are of African descent, or are overweight.  Have your blood pressure checked: ? Every 3-5 years if you are 18-39 years of age. ? Every year if you are 40 years old or older. Eat a healthy diet   Eat a diet that includes plenty of vegetables, fruits, low-fat dairy products, and lean protein.  Do not eat a lot of foods that are high in solid fats, added sugars, or sodium. Get regular exercise Get regular exercise. This is one of the most important things you can do for your health. Most adults should:  Try to exercise for at least 150 minutes each week. The exercise should increase your heart rate and make you sweat (moderate-intensity exercise).  Try to do strengthening exercises at least twice each week. Do these in addition to the moderate-intensity exercise.  Spend less time sitting. Even light physical activity can be beneficial. Other tips  Work with your health care provider to achieve or maintain a healthy weight.  Do not use any products that contain nicotine or tobacco, such as cigarettes, e-cigarettes, and chewing tobacco. If you need help quitting, ask your health care provider.  Know your numbers. Ask your health care provider to check your cholesterol and your blood sugar (glucose). Continue to have your blood tested as directed by your health care provider. Do I need screening for cancer? Depending on your health history and family history, you may need to have cancer screening at different stages of your life. This   may include screening for:  Breast cancer.  Cervical cancer.  Lung cancer.  Colorectal cancer. What is my risk for osteoporosis? After menopause, you may be at increased risk for osteoporosis. Osteoporosis is a condition in which bone destruction happens more quickly than new bone creation. To help prevent  osteoporosis or the bone fractures that can happen because of osteoporosis, you may take the following actions:  If you are 19-50 years old, get at least 1,000 mg of calcium and at least 600 mg of vitamin D per day.  If you are older than age 50 but younger than age 70, get at least 1,200 mg of calcium and at least 600 mg of vitamin D per day.  If you are older than age 70, get at least 1,200 mg of calcium and at least 800 mg of vitamin D per day. Smoking and drinking excessive alcohol increase the risk of osteoporosis. Eat foods that are rich in calcium and vitamin D, and do weight-bearing exercises several times each week as directed by your health care provider. How does menopause affect my mental health? Depression may occur at any age, but it is more common as you become older. Common symptoms of depression include:  Low or sad mood.  Changes in sleep patterns.  Changes in appetite or eating patterns.  Feeling an overall lack of motivation or enjoyment of activities that you previously enjoyed.  Frequent crying spells. Talk with your health care provider if you think that you are experiencing depression. General instructions See your health care provider for regular wellness exams and vaccines. This may include:  Scheduling regular health, dental, and eye exams.  Getting and maintaining your vaccines. These include: ? Influenza vaccine. Get this vaccine each year before the flu season begins. ? Pneumonia vaccine. ? Shingles vaccine. ? Tetanus, diphtheria, and pertussis (Tdap) booster vaccine. Your health care provider may also recommend other immunizations. Tell your health care provider if you have ever been abused or do not feel safe at home. Summary  Menopause is a normal process in which your ability to get pregnant comes to an end.  This condition causes hot flashes, night sweats, decreased interest in sex, mood swings, headaches, or lack of sleep.  Treatment for this  condition may include hormone replacement therapy.  Take actions to keep yourself healthy, including exercising regularly, eating a healthy diet, watching your weight, and checking your blood pressure and blood sugar levels.  Get screened for cancer and depression. Make sure that you are up to date with all your vaccines. This information is not intended to replace advice given to you by your health care provider. Make sure you discuss any questions you have with your health care provider. Document Revised: 10/05/2018 Document Reviewed: 10/05/2018 Elsevier Patient Education  2020 [employer]    Breast Self-Awareness Breast self-awareness means being familiar with how your breasts look and feel. It involves checking your breasts regularly and reporting any changes to your health care provider. Practicing breast self-awareness is important. Sometimes changes may not be harmful (are benign), but sometimes a change in your breasts can be a sign of a serious medical problem. It is important to learn how to do this procedure correctly so that you can catch problems early, when treatment is more likely to be successful. All women should practice breast self-awareness, including women who have had breast implants. What you need:  A mirror.  A well-lit room. How to do a breast self-exam A breast self-exam is   one way to learn what is normal for your breasts and whether your breasts are changing. To do a breast self-exam: Look for changes  1. Remove all the clothing above your waist. 2. Stand in front of a mirror in a room with good lighting. 3. Put your hands on your hips. 4. Push your hands firmly downward. 5. Compare your breasts in the mirror. Look for differences between them (asymmetry), such as: ? Differences in shape. ? Differences in size. ? Puckers, dips, and bumps in one breast and not the other. 6. Look at each breast for changes in the skin, such as: ? Redness. ? Scaly  areas. 7. Look for changes in your nipples, such as: ? Discharge. ? Bleeding. ? Dimpling. ? Redness. ? A change in position. Feel for changes Carefully feel your breasts for lumps and changes. It is best to do this while lying on your back on the floor, and again while sitting or standing in the tub or shower with soapy water on your skin. Feel each breast in the following way: 1. Place the arm on the side of the breast you are examining above your head. 2. Feel your breast with the other hand. 3. Start in the nipple area and make -inch (2 cm) overlapping circles to feel your breast. Use the pads of your three middle fingers to do this. Apply light pressure, then medium pressure, then firm pressure. The light pressure will allow you to feel the tissue closest to the skin. The medium pressure will allow you to feel the tissue that is a little deeper. The firm pressure will allow you to feel the tissue close to the ribs. 4. Continue the overlapping circles, moving downward over the breast until you feel your ribs below your breast. 5. Move one finger-width toward the center of the body. Continue to use the -inch (2 cm) overlapping circles to feel your breast as you move slowly up toward your collarbone. 6. Continue the up-and-down exam using all three pressures until you reach your armpit.  Write down what you find Writing down what you find can help you remember what to discuss with your health care provider. Write down:  What is normal for each breast.  Any changes that you find in each breast, including: ? The kind of changes you find. ? Any pain or tenderness. ? Size and location of any lumps.  Where you are in your menstrual cycle, if you are still menstruating. General tips and recommendations  Examine your breasts every month.  If you are breastfeeding, the best time to examine your breasts is after a feeding or after using a breast pump.  If you menstruate, the best time to  examine your breasts is 5-7 days after your period. Breasts are generally lumpier during menstrual periods, and it may be more difficult to notice changes.  With time and practice, you will become more familiar with the variations in your breasts and more comfortable with the exam. Contact a health care provider if you:  See a change in the shape or size of your breasts or nipples.  See a change in the skin of your breast or nipples, such as a reddened or scaly area.  Have unusual discharge from your nipples.  Find a lump or thick area that was not there before.  Have pain in your breasts.  Have any concerns related to your breast health. Summary  Breast self-awareness includes looking for physical changes in your breasts, as well   as feeling for any changes within your breasts.  Breast self-awareness should be performed in front of a mirror in a well-lit room.  You should examine your breasts every month. If you menstruate, the best time to examine your breasts is 5-7 days after your menstrual period.  Let your health care provider know of any changes you notice in your breasts, including changes in size, changes on the skin, pain or tenderness, or unusual fluid from your nipples. This information is not intended to replace advice given to you by your health care provider. Make sure you discuss any questions you have with your health care provider. Document Revised: 05/31/2018 Document Reviewed: 05/31/2018 Elsevier Patient Education  [place].   Menopause and Hormone Replacement Therapy Menopause is a normal time of life when menstrual periods stop completely and the ovaries stop producing the female hormones estrogen and progesterone. This lack of hormones can affect your health and cause undesirable symptoms. Hormone replacement therapy (HRT) can relieve some of those symptoms. What is hormone replacement therapy? HRT is the use of artificial (synthetic) hormones to  replace hormones that your body has stopped producing because you have reached menopause. What are my options for HRT?  HRT may consist of the synthetic hormones estrogen and progestin, or it may consist of only estrogen (estrogen-only therapy). You and your health care provider will decide which form of HRT is best for you. If you choose to be on HRT and you have a uterus, estrogen and progestin are usually prescribed. Estrogen-only therapy is used for women who do not have a uterus. Possible options for taking HRT include:  Pills.  Patches.  Gels.  Sprays.  Vaginal cream.  Vaginal rings.  Vaginal inserts. The amount of hormone(s) that you take and how long you take the hormone(s) varies according to your health. It is important to:  Begin HRT with the lowest possible dosage.  Stop HRT as soon as your health care provider tells you to stop.  Work with your health care provider so that you feel informed and comfortable with your decisions. What are the benefits of HRT? HRT can reduce the frequency and severity of menopausal symptoms. Benefits of HRT vary according to the kind of symptoms that you have, how severe they are, and your overall health. HRT may help to improve the following symptoms of menopause:  Hot flashes and night sweats. These are sudden feelings of heat that spread over the face and body. The skin may turn red, like a blush. Night sweats are hot flashes that happen while you are sleeping or trying to sleep.  Bone loss (osteoporosis). The body loses calcium more quickly after menopause, causing the bones to become weaker. This can increase the risk for bone breaks (fractures).  Vaginal dryness. The lining of the vagina can become thin and dry, which can cause pain during sex or cause infection, burning, or itching.  Urinary tract infections.  Urinary incontinence. This is the inability to control when you pass urine.  Irritability.  Short-term memory  problems. What are the risks of HRT? Risks of HRT vary depending on your individual health and medical history. Risks of HRT also depend on whether you receive both estrogen and progestin or you receive estrogen only. HRT may increase the risk of:  Spotting. This is when a small amount of blood leaks from the vagina unexpectedly.  Endometrial cancer. This cancer is in the lining of the uterus (endometrium).  Breast cancer.  Increased density  of breast tissue. This can make it harder to find breast cancer on a breast X-ray (mammogram).  Stroke.  Heart disease.  Blood clots.  Gallbladder disease.  Liver disease. Risks of HRT can increase if you have any of the following conditions:  Endometrial cancer.  Liver disease.  Heart disease.  Breast cancer.  History of blood clots.  History of stroke. Follow these instructions at home:  Take over-the-counter and prescription medicines only as told by your health care provider.  Get mammograms, pelvic exams, and medical checkups as often as told by your health care provider.  Have Pap tests done as often as told by your health care provider. A Pap test is sometimes called a Pap smear. It is a screening test that is used to check for signs of cancer of the cervix and vagina. A Pap test can also identify the presence of infection or precancerous changes. Pap tests may be done: ? Every 3 years, starting at age 66. ? Every 5 years, starting after age 34, in combination with testing for human papillomavirus (HPV). ? More often or less often depending on other medical conditions you have, your age, and other risk factors.  It is up to you to get the results of your Pap test. Ask your health care provider, or the department that is doing the test, when your results will be ready.  Keep all follow-up visits as told by your health care provider. This is important. Contact a health care provider if you have:  Pain or swelling in your  legs.  Shortness of breath.  Chest pain.  Lumps or changes in your breasts or armpits.  Slurred speech.  Pain, burning, or bleeding when you urinate.  Unusual vaginal bleeding.  Dizziness or headaches.  Weakness or numbness in any part of your arms or legs.  Pain in your abdomen. Summary  Menopause is a normal time of life when menstrual periods stop completely and the ovaries stop producing the female hormones estrogen and progesterone.  Hormone replacement therapy (HRT) can relieve some of the symptoms of menopause.  HRT can reduce the frequency and severity of menopausal symptoms.  Risks of HRT vary depending on your individual health and medical history. This information is not intended to replace advice given to you by your health care provider. Make sure you discuss any questions you have with your health care provider. Document Revised: 06/14/2018 Document Reviewed: 06/14/2018 Elsevier Patient Education  2020 [employer].

## 2019-12-28 ENCOUNTER — Other Ambulatory Visit: Payer: Self-pay

## 2019-12-28 ENCOUNTER — Ambulatory Visit
Admission: RE | Admit: 2019-12-28 | Discharge: 2019-12-28 | Disposition: A | Discharge status: Home / Self Care | Payer: PPO | Source: Ambulatory Visit | Attending: Ophthalmology | Admitting: Ophthalmology

## 2019-12-28 DIAGNOSIS — I6522 Occlusion and stenosis of left carotid artery: Secondary | ICD-10-CM | POA: Diagnosis not present

## 2019-12-28 DIAGNOSIS — G453 Amaurosis fugax: Secondary | ICD-10-CM

## 2020-01-11 ENCOUNTER — Ambulatory Visit (INDEPENDENT_AMBULATORY_CARE_PROVIDER_SITE_OTHER): Payer: PPO | Admitting: Vascular Surgery

## 2020-01-11 ENCOUNTER — Encounter (INDEPENDENT_AMBULATORY_CARE_PROVIDER_SITE_OTHER): Payer: Self-pay | Admitting: Vascular Surgery

## 2020-01-11 ENCOUNTER — Other Ambulatory Visit: Payer: Self-pay

## 2020-01-11 VITALS — BP 165/80 | HR 64 | Resp 16 | Ht 62.0 in | Wt 111.0 lb

## 2020-01-11 DIAGNOSIS — E782 Mixed hyperlipidemia: Secondary | ICD-10-CM | POA: Diagnosis not present

## 2020-01-11 DIAGNOSIS — H539 Unspecified visual disturbance: Secondary | ICD-10-CM

## 2020-01-11 DIAGNOSIS — I6523 Occlusion and stenosis of bilateral carotid arteries: Secondary | ICD-10-CM

## 2020-01-11 DIAGNOSIS — K219 Gastro-esophageal reflux disease without esophagitis: Secondary | ICD-10-CM | POA: Diagnosis not present

## 2020-01-11 DIAGNOSIS — I6529 Occlusion and stenosis of unspecified carotid artery: Secondary | ICD-10-CM | POA: Insufficient documentation

## 2020-01-11 NOTE — Progress Notes (Signed)
MRN : [id number]  Elizabeth Friedman is a 79 y.o. (1941/08/21) female who presents with chief complaint of  Chief Complaint  Patient presents with  . New Patient (Initial Visit)    ref [place] carotid stenosis  .  History of Present Illness:   The patient is seen for evaluation of carotid stenosis. The carotid stenosis was identified after a carotid duplex was obtained secondary to visual changes.  The patient describes these changes as a glimmer ring of both eyes frequently occurring peripherally.  There have been several episodes that will last long there done appear to be any inciting or alleviating factors.  She does have the diagnosis of migraine headaches with aura.  The patient denies true amaurosis fugax. There is no recent history of TIA symptoms or focal motor deficits. There is no prior documented CVA.  There is no history of migraine headaches. There is no history of seizures.  The patient is taking enteric-coated aspirin 81 mg daily.  The patient has a history of coronary artery disease, no recent episodes of angina or shortness of breath. The patient denies PAD or claudication symptoms. There is a history of hyperlipidemia which is being treated with a statin.   Duplex ultrasound of the bilateral cervical carotid arteries dated December 28, 2019 demonstrates minimal atherosclerotic plaque with no evidence of stenosis of the right internal carotid artery and less than 50% stenosis with mild heterogeneous atherosclerotic plaque of the left internal carotid artery  Current Meds  Medication Sig  . acetaminophen (TYLENOL) 500 MG tablet Take 500 mg by mouth every 6 (six) hours as needed.  Marland Kitchen aspirin EC 81 MG tablet Take 81 mg by mouth daily.  . Calcium Acetate-Magnesium Carb (MAGNEBIND 200 PO) Take by mouth.  . calcium carbonate 1250 MG capsule Take 1,250 mg by mouth 2 (two) times daily with a meal.  . celecoxib (CELEBREX) 200 MG capsule Take 200 mg by mouth 2 (two) times  daily.  . cholecalciferol (VITAMIN D) 1000 units tablet Take 1,000 Units by mouth daily.  Marland Kitchen co-enzyme Q-10 30 MG capsule Take 30 mg by mouth 3 (three) times daily.  Marland Kitchen estradiol (ESTRACE) 1 MG tablet Take 1 tablet (1 mg total) by mouth daily.  Marland Kitchen gabapentin (NEURONTIN) 100 MG capsule Take 100 mg by mouth 3 (three) times daily.  . medroxyPROGESTERone (PROVERA) 5 MG tablet Take 1 tablet (5 mg total) by mouth daily. Days 1-12  . Omega-3 Fatty Acids (FISH OIL) 1000 MG CAPS Take by mouth.  Marland Kitchen omeprazole (PRILOSEC) 20 MG capsule Take 20 mg by mouth daily.  . RESTASIS 0.05 % ophthalmic emulsion   . simvastatin (ZOCOR) 10 MG tablet Take 10 mg by mouth daily.  Marland Kitchen topiramate (TOPAMAX) 50 MG tablet Take 50 mg by mouth daily.    Past Medical History:  Diagnosis Date  . Allergic rhinitis   . Arthritis   . Atypical chest pain   . Cancer ([place])    skin  . Fibrocystic breast disease   . GERD (gastroesophageal reflux disease)   . Hyperlipidemia   . Lung nodule   . Menopause   . Migraine   . Migraine    optical migraines  . Plantar fasciitis   . 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 Sciatic leg pain   . Sciatica   . Urinary retention with incomplete bladder emptying   . Vitreous detachment of both eyes     Past Surgical History:  Procedure  Laterality Date  . APPENDECTOMY    . CARDIAC CATHETERIZATION    . COLONOSCOPY WITH PROPOFOL N/A 03/27/2016   Procedure: COLONOSCOPY WITH PROPOFOL;  Surgeon: Manya Silvas, MD;  Location[facility] ENDOSCOPY;  Service: Endoscopy;  Laterality: N/A;  . SKIN CANCER EXCISION     face and neck    Social History Social History   Tobacco Use  . Smoking status: Never Smoker  . Smokeless tobacco: Never Used  Substance Use Topics  . Alcohol use: No  . Drug use: No    Family History Family History  Problem Relation Age of Onset  . Breast cancer Mother   . Heart disease Father   . Colon cancer Brother   . Diabetes Neg Hx   . Ovarian cancer  Neg Hx   No family history of bleeding/clotting disorders, porphyria or autoimmune disease   No Known Allergies   REVIEW OF SYSTEMS (Negative unless checked)  Constitutional: [] Weight loss  [] Fever  [] Chills Cardiac: [] Chest pain   [] Chest pressure   [] Palpitations   [] Shortness of breath when laying flat   [] Shortness of breath with exertion. Vascular:  [] Pain in legs with walking   [] Pain in legs at rest  [] History of DVT   [] Phlebitis   [] Swelling in legs   [] Varicose veins   [] Non-healing ulcers Pulmonary:   [] Uses home oxygen   [] Productive cough   [] Hemoptysis   [] Wheeze  [] COPD   [] Asthma Neurologic:  [] Dizziness   [] Seizures   [] History of stroke   [] History of TIA  [] Aphasia   [x] Vissual changes   [] Weakness or numbness in arm   [] Weakness or numbness in leg Musculoskeletal:   [] Joint swelling   [] Joint pain   [] Low back pain Hematologic:  [] Easy bruising  [] Easy bleeding   [] Hypercoagulable state   [] Anemic Gastrointestinal:  [] Diarrhea   [] Vomiting  [] Gastroesophageal reflux/heartburn   [] Difficulty swallowing. Genitourinary:  [] Chronic kidney disease   [] Difficult urination  [] Frequent urination   [] Blood in urine Skin:  [] Rashes   [] Ulcers  Psychological:  [] History of anxiety   []  History of major depression.  Physical Examination  Vitals:   01/11/20 1309 01/11/20 1310  BP: (!) 160/71 (!) 165/80  Pulse: 64   Resp: 16   Weight: 111 lb (50.3 kg)   Height: 5\' 2"  (1.575 m)    Body mass index is 20.3 kg/m. Gen: WD/WN, NAD Head: [place]/AT, No temporalis wasting.  Ear/Nose/Throat: Hearing grossly intact, nares w/o erythema or drainage, poor dentition Eyes: PER, EOMI, sclera nonicteric.  Neck: Supple, no masses.  No bruit or JVD.  Pulmonary:  Good air movement, clear to auscultation bilaterally, no use of accessory muscles.  Cardiac: RRR, normal S1, S2, no Murmurs. Vascular: No carotid bruits Vessel Right Left  Radial Palpable Palpable  Brachial Palpable Palpable    Carotid Palpable Palpable  Gastrointestinal: soft, non-distended. No guarding/no peritoneal signs.  Musculoskeletal: M/S 5/5 throughout.  No deformity or atrophy.  Neurologic: CN 2-12 intact. Pain and light touch intact in extremities.  Symmetrical.  Speech is fluent. Motor exam as listed above. Psychiatric: Judgment intact, Mood & affect appropriate for pt's clinical situation. Dermatologic: No rashes or ulcers noted.  No changes consistent with cellulitis. Lymph : No Cervical lymphadenopathy, no lichenification or skin changes of chronic lymphedema.  CBC No results found for: WBC, HGB, HCT, MCV, PLT  BMET No results found for: NA, K, CL, CO2, GLUCOSE, BUN, CREATININE, CALCIUM, GFRNONAA, GFRAA CrCl cannot be calculated (No successful lab value found.).  COAG No results found for: INR, PROTIME  Radiology US Carotid Bilateral  Result Date: 12/28/2019 CLINICAL DATA:  Amaurosis fugax EXAM: BILATERAL CAROTID DUPLEX ULTRASOUND TECHNIQUE: Pearline Cables scale imaging, color Doppler and duplex ultrasound were performed of bilateral carotid and vertebral arteries in the neck. COMPARISON:  None. FINDINGS: Criteria: Quantification of carotid stenosis is based on velocity parameters that correlate the residual internal carotid diameter with NASCET-based stenosis levels, using the diameter of the distal internal carotid lumen as the denominator for stenosis measurement. The following velocity measurements were obtained: RIGHT ICA: 88/36 cm/sec CCA: [id number] cm/sec SYSTOLIC ICA/CCA RATIO: 1.3 ECA:  54 cm/sec LEFT ICA: 110/40 cm/sec CCA: [id number] cm/sec SYSTOLIC ICA/CCA RATIO: 1.5 ECA:  50 cm/sec RIGHT CAROTID ARTERY: No significant atherosclerotic plaque or evidence of stenosis in the internal carotid artery. RIGHT VERTEBRAL ARTERY:  Patent with normal antegrade flow. LEFT CAROTID ARTERY: Trace heterogeneous atherosclerotic plaque in the proximal internal carotid artery. By peak systolic velocity criteria, the estimated  stenosis is less than 50%. LEFT VERTEBRAL ARTERY:  Patent with normal antegrade flow. IMPRESSION: 1. Mild (1-49%) stenosis proximal left internal carotid artery secondary to trace focal heterogeneous atherosclerotic plaque. 2. No significant atherosclerotic plaque or evidence of stenosis in the right internal carotid artery. 3. The vertebral arteries are patent with normal antegrade flow. Signed, Criselda Peaches, MD, [facility] Radiology Electronically Signed   By: Jacqulynn Cadet M.D.   On: 12/28/2019 15:53     Assessment/Plan 1. Visual changes The patient is likely asymptomatic with respect to the carotid stenosis based on the bilateral nature of her visual changes as well as the minimal amount of atherosclerotic plaque noted bilaterally.  However, there could be a atherosclerotic stenosis more proximally and or more distally in the intracranial portion.  Given this I have discussed the possibility of CT angiography of the neck and head.  CT angiography of the carotid arteries to define the degree of stenosis of the entire carotid system.  She acknowledged this and together we have decided that she will talk with Dr. Caryl Comes her primary care to see whether that is advisable.  Given her bilateral somewhat vague complaints it does appear that this is more of an aura then true amaurosis fugax.  She will follow-up with me as needed.  At this time I would recommend a follow-up duplex ultrasound of the carotids in 1 year if she does not move forward with CT angiography.  Continue antiplatelet therapy as prescribed. Continue management of CAD, HTN and Hyperlipidemia. Healthy heart diet, encouraged exercise at least 4 times per week.    2. Bilateral carotid artery stenosis See #1  3. Gastroesophageal reflux disease without esophagitis Continue PPI as already ordered, this medication has been reviewed and there are no changes at this  time.  Avoidence of caffeine and alcohol  Moderate elevation of the head of the bed   4. Mixed hyperlipidemia Continue statin as ordered and reviewed, no changes at this time     Hortencia Pilar, MD  01/12/2020 10:43 AM

## 2020-01-12 ENCOUNTER — Encounter (INDEPENDENT_AMBULATORY_CARE_PROVIDER_SITE_OTHER): Payer: Self-pay | Admitting: Vascular Surgery

## 2020-01-12 DIAGNOSIS — H539 Unspecified visual disturbance: Secondary | ICD-10-CM | POA: Insufficient documentation

## 2020-01-31 DIAGNOSIS — Z1159 Encounter for screening for other viral diseases: Secondary | ICD-10-CM | POA: Diagnosis not present

## 2020-01-31 DIAGNOSIS — M5430 Sciatica, unspecified side: Secondary | ICD-10-CM | POA: Diagnosis not present

## 2020-01-31 DIAGNOSIS — Z0000 Encounter for general adult medical examination without abnormal findings: Secondary | ICD-10-CM | POA: Diagnosis not present

## 2020-01-31 DIAGNOSIS — E785 Hyperlipidemia, unspecified: Secondary | ICD-10-CM | POA: Diagnosis not present

## 2020-01-31 DIAGNOSIS — K219 Gastro-esophageal reflux disease without esophagitis: Secondary | ICD-10-CM | POA: Diagnosis not present

## 2020-02-06 DIAGNOSIS — I6523 Occlusion and stenosis of bilateral carotid arteries: Secondary | ICD-10-CM | POA: Insufficient documentation

## 2020-02-07 DIAGNOSIS — J8410 Pulmonary fibrosis, unspecified: Secondary | ICD-10-CM | POA: Diagnosis not present

## 2020-02-07 DIAGNOSIS — D649 Anemia, unspecified: Secondary | ICD-10-CM | POA: Diagnosis not present

## 2020-02-07 DIAGNOSIS — E611 Iron deficiency: Secondary | ICD-10-CM | POA: Diagnosis not present

## 2020-02-07 DIAGNOSIS — I6523 Occlusion and stenosis of bilateral carotid arteries: Secondary | ICD-10-CM | POA: Diagnosis not present

## 2020-02-07 DIAGNOSIS — K219 Gastro-esophageal reflux disease without esophagitis: Secondary | ICD-10-CM | POA: Diagnosis not present

## 2020-02-07 DIAGNOSIS — Z1211 Encounter for screening for malignant neoplasm of colon: Secondary | ICD-10-CM | POA: Diagnosis not present

## 2020-02-07 DIAGNOSIS — E785 Hyperlipidemia, unspecified: Secondary | ICD-10-CM | POA: Diagnosis not present

## 2020-02-13 DIAGNOSIS — Z800 Family history of malignant neoplasm of digestive organs: Secondary | ICD-10-CM | POA: Diagnosis not present

## 2020-02-13 DIAGNOSIS — K219 Gastro-esophageal reflux disease without esophagitis: Secondary | ICD-10-CM | POA: Diagnosis not present

## 2020-02-13 DIAGNOSIS — E785 Hyperlipidemia, unspecified: Secondary | ICD-10-CM | POA: Diagnosis not present

## 2020-02-13 DIAGNOSIS — D509 Iron deficiency anemia, unspecified: Secondary | ICD-10-CM | POA: Diagnosis not present

## 2020-02-13 DIAGNOSIS — Z1211 Encounter for screening for malignant neoplasm of colon: Secondary | ICD-10-CM | POA: Diagnosis not present

## 2020-02-13 DIAGNOSIS — D649 Anemia, unspecified: Secondary | ICD-10-CM | POA: Diagnosis not present

## 2020-02-13 DIAGNOSIS — J8410 Pulmonary fibrosis, unspecified: Secondary | ICD-10-CM | POA: Diagnosis not present

## 2020-02-13 DIAGNOSIS — I6523 Occlusion and stenosis of bilateral carotid arteries: Secondary | ICD-10-CM | POA: Diagnosis not present

## 2020-02-13 DIAGNOSIS — E611 Iron deficiency: Secondary | ICD-10-CM | POA: Diagnosis not present

## 2020-02-23 DIAGNOSIS — D649 Anemia, unspecified: Secondary | ICD-10-CM | POA: Diagnosis not present

## 2020-03-02 ENCOUNTER — Other Ambulatory Visit: Payer: Self-pay | Admitting: Obstetrics and Gynecology

## 2020-03-08 DIAGNOSIS — D649 Anemia, unspecified: Secondary | ICD-10-CM | POA: Diagnosis not present

## 2020-03-08 DIAGNOSIS — E611 Iron deficiency: Secondary | ICD-10-CM | POA: Diagnosis not present

## 2020-03-08 DIAGNOSIS — K219 Gastro-esophageal reflux disease without esophagitis: Secondary | ICD-10-CM | POA: Diagnosis not present

## 2020-04-15 DIAGNOSIS — I6523 Occlusion and stenosis of bilateral carotid arteries: Secondary | ICD-10-CM | POA: Diagnosis not present

## 2020-04-15 DIAGNOSIS — E785 Hyperlipidemia, unspecified: Secondary | ICD-10-CM | POA: Diagnosis not present

## 2020-04-15 DIAGNOSIS — J8410 Pulmonary fibrosis, unspecified: Secondary | ICD-10-CM | POA: Diagnosis not present

## 2020-04-15 DIAGNOSIS — E611 Iron deficiency: Secondary | ICD-10-CM | POA: Diagnosis not present

## 2020-04-15 DIAGNOSIS — K219 Gastro-esophageal reflux disease without esophagitis: Secondary | ICD-10-CM | POA: Diagnosis not present

## 2020-04-16 DIAGNOSIS — Z1231 Encounter for screening mammogram for malignant neoplasm of breast: Secondary | ICD-10-CM | POA: Diagnosis not present

## 2020-05-03 ENCOUNTER — Ambulatory Visit: Admit: 2020-05-03 | Payer: PPO | Admitting: General Surgery

## 2020-05-03 SURGERY — ESOPHAGOGASTRODUODENOSCOPY (EGD) WITH PROPOFOL
Anesthesia: General

## 2020-05-07 DIAGNOSIS — D509 Iron deficiency anemia, unspecified: Secondary | ICD-10-CM | POA: Diagnosis not present

## 2020-05-07 DIAGNOSIS — Z85828 Personal history of other malignant neoplasm of skin: Secondary | ICD-10-CM | POA: Diagnosis not present

## 2020-05-07 DIAGNOSIS — I6523 Occlusion and stenosis of bilateral carotid arteries: Secondary | ICD-10-CM | POA: Diagnosis not present

## 2020-05-07 DIAGNOSIS — J8410 Pulmonary fibrosis, unspecified: Secondary | ICD-10-CM | POA: Diagnosis not present

## 2020-05-07 DIAGNOSIS — E785 Hyperlipidemia, unspecified: Secondary | ICD-10-CM | POA: Diagnosis not present

## 2020-05-07 DIAGNOSIS — M5430 Sciatica, unspecified side: Secondary | ICD-10-CM | POA: Diagnosis not present

## 2020-05-07 DIAGNOSIS — K219 Gastro-esophageal reflux disease without esophagitis: Secondary | ICD-10-CM | POA: Diagnosis not present

## 2020-05-09 DIAGNOSIS — R921 Mammographic calcification found on diagnostic imaging of breast: Secondary | ICD-10-CM | POA: Diagnosis not present

## 2020-05-15 ENCOUNTER — Telehealth: Payer: Self-pay

## 2020-05-15 DIAGNOSIS — D0511 Intraductal carcinoma in situ of right breast: Secondary | ICD-10-CM | POA: Diagnosis not present

## 2020-05-15 DIAGNOSIS — R921 Mammographic calcification found on diagnostic imaging of breast: Secondary | ICD-10-CM | POA: Diagnosis not present

## 2020-05-15 NOTE — Telephone Encounter (Signed)
Spoke to pt she was already aware of her mammogram results and had her follow up mammogram with bx today.

## 2020-05-17 ENCOUNTER — Encounter: Payer: Self-pay | Admitting: *Deleted

## 2020-05-17 DIAGNOSIS — D0511 Intraductal carcinoma in situ of right breast: Secondary | ICD-10-CM | POA: Insufficient documentation

## 2020-05-22 ENCOUNTER — Other Ambulatory Visit: Payer: Self-pay

## 2020-05-22 ENCOUNTER — Ambulatory Visit
Admission: RE | Admit: 2020-05-22 | Discharge: 2020-05-22 | Disposition: A | Discharge status: Home / Self Care | Payer: PPO | Source: Ambulatory Visit | Attending: Radiation Oncology | Admitting: Radiation Oncology

## 2020-05-22 ENCOUNTER — Encounter: Payer: Self-pay | Admitting: *Deleted

## 2020-05-22 ENCOUNTER — Inpatient Hospital Stay: Payer: PPO | Attending: Oncology | Admitting: Oncology

## 2020-05-22 ENCOUNTER — Encounter: Payer: Self-pay | Admitting: Radiation Oncology

## 2020-05-22 ENCOUNTER — Encounter: Payer: Self-pay | Admitting: Oncology

## 2020-05-22 ENCOUNTER — Encounter: Payer: Self-pay | Admitting: Licensed Clinical Social Worker

## 2020-05-22 ENCOUNTER — Inpatient Hospital Stay: Payer: PPO

## 2020-05-22 ENCOUNTER — Other Ambulatory Visit: Payer: Self-pay | Admitting: General Surgery

## 2020-05-22 VITALS — BP 189/89 | HR 65 | Temp 98.7°F | Resp 16 | Ht 62.0 in | Wt 108.8 lb

## 2020-05-22 DIAGNOSIS — Z79899 Other long term (current) drug therapy: Secondary | ICD-10-CM | POA: Diagnosis not present

## 2020-05-22 DIAGNOSIS — Z803 Family history of malignant neoplasm of breast: Secondary | ICD-10-CM | POA: Diagnosis not present

## 2020-05-22 DIAGNOSIS — Z4689 Encounter for fitting and adjustment of other specified devices: Secondary | ICD-10-CM

## 2020-05-22 DIAGNOSIS — N816 Rectocele: Secondary | ICD-10-CM

## 2020-05-22 DIAGNOSIS — D0511 Intraductal carcinoma in situ of right breast: Secondary | ICD-10-CM

## 2020-05-22 DIAGNOSIS — Z170 Estrogen receptor positive status [ER+]: Secondary | ICD-10-CM | POA: Diagnosis not present

## 2020-05-22 DIAGNOSIS — Z85828 Personal history of other malignant neoplasm of skin: Secondary | ICD-10-CM | POA: Diagnosis not present

## 2020-05-22 DIAGNOSIS — N814 Uterovaginal prolapse, unspecified: Secondary | ICD-10-CM

## 2020-05-22 LAB — CBC WITH DIFFERENTIAL (CANCER CENTER ONLY)
Abs Immature Granulocytes: 0.02 10*3/uL (ref 0.00–0.07)
Basophils Absolute: 0.1 10*3/uL (ref 0.0–0.1)
Basophils Relative: 1 %
Eosinophils Absolute: 0.1 10*3/uL (ref 0.0–0.5)
Eosinophils Relative: 1 %
HCT: 35.7 % — ABNORMAL LOW (ref 36.0–46.0)
Hemoglobin: 11.8 g/dL — ABNORMAL LOW (ref 12.0–15.0)
Immature Granulocytes: 0 %
Lymphocytes Relative: 21 %
Lymphs Abs: 1.4 10*3/uL (ref 0.7–4.0)
MCH: 30.5 pg (ref 26.0–34.0)
MCHC: 33.1 g/dL (ref 30.0–36.0)
MCV: 92.2 fL (ref 80.0–100.0)
Monocytes Absolute: 0.6 10*3/uL (ref 0.1–1.0)
Monocytes Relative: 8 %
Neutro Abs: 4.6 10*3/uL (ref 1.7–7.7)
Neutrophils Relative %: 69 %
Platelet Count: 223 10*3/uL (ref 150–400)
RBC: 3.87 MIL/uL (ref 3.87–5.11)
RDW: 14.6 % (ref 11.5–15.5)
WBC Count: 6.7 10*3/uL (ref 4.0–10.5)
nRBC: 0 % (ref 0.0–0.2)

## 2020-05-22 LAB — CMP (CANCER CENTER ONLY)
ALT: 11 U/L (ref 0–44)
AST: 17 U/L (ref 15–41)
Albumin: 3.8 g/dL (ref 3.5–5.0)
Alkaline Phosphatase: 57 U/L (ref 38–126)
Anion gap: 7 (ref 5–15)
BUN: 16 mg/dL (ref 8–23)
CO2: 25 mmol/L (ref 22–32)
Calcium: 10 mg/dL (ref 8.9–10.3)
Chloride: 103 mmol/L (ref 98–111)
Creatinine: 1.03 mg/dL — ABNORMAL HIGH (ref 0.44–1.00)
GFR, Est AFR Am: 60 mL/min — ABNORMAL LOW (ref >60)
GFR, Estimated: 52 mL/min — ABNORMAL LOW (ref >60)
Glucose, Bld: 88 mg/dL (ref 70–99)
Potassium: 4 mmol/L (ref 3.5–5.1)
Sodium: 135 mmol/L (ref 135–145)
Total Bilirubin: 0.3 mg/dL (ref 0.3–1.2)
Total Protein: 6.5 g/dL (ref 6.5–8.1)

## 2020-05-22 LAB — GENETIC SCREENING ORDER

## 2020-05-22 NOTE — Progress Notes (Signed)
Radiation Oncology         [phone] ________________________________  Initial outpatient Consultation  Name: Elizabeth Friedman MRN: [id number]  Date: 05/22/2020  DOB: 26-Dec-1940  JF:[id number], Tonette Bihari, MD  Rolm Bookbinder, MD   REFERRING PHYSICIAN: Rolm Bookbinder, MD  DIAGNOSIS:    ICD-10-CM   1. Ductal carcinoma in situ (DCIS) of right breast  D05.11    Cancer Staging Ductal carcinoma in situ (DCIS) of right breast Staging form: Breast, AJCC 8th Edition - Clinical stage from 05/22/2020: Stage 0 (cTis (DCIS), cN0, cM0, ER+, PR+) - Unsigned  CHIEF COMPLAINT: Here to discuss management of Right breast DCIS  HISTORY OF PRESENT ILLNESS::Elizabeth Friedman is a 79 y.o. female who presented with breast abnormality on the following imaging: screening mammogram on the date of 04/16/2020.  Symptoms, if any, at that time, were: none.   Ultrasound of breast on 05/09/2020 revealed 1.5 cm grouped calcifications in right breast.   Biopsy on date of 05/15/2020 showed DCIS with necrosis and calcifications.  ER status: 95%; PR status 90%; Grade 3.  She is in her usual state of health.  She reports a history of Raynaud's syndrome.  She reports that she is cold natured as a baseline.  She is able to drive herself to appointments.  She is here with her son today who appears very supportive.  She denies smoking or drinking alcohol.  She denies prior radiotherapy.  PREVIOUS RADIATION THERAPY: No  PAST MEDICAL HISTORY:  has a past medical history of Allergic rhinitis, Arthritis, Atypical chest pain, Breast cancer ([place]), Cancer ([place]), Fibrocystic breast disease, GERD (gastroesophageal reflux disease), Hyperlipidemia, Lung nodule, Menopause, Migraine, Migraine, Plantar fasciitis, Prolapse of female pelvic organs, Sciatic leg pain, Sciatica, Urinary retention with incomplete bladder emptying, and Vitreous detachment of both eyes.    PAST SURGICAL HISTORY: Past Surgical History:  Procedure  Laterality Date   APPENDECTOMY     CARDIAC CATHETERIZATION     COLONOSCOPY WITH PROPOFOL N/A 03/27/2016   Procedure: COLONOSCOPY WITH PROPOFOL;  Surgeon: Manya Silvas, MD;  Location[facility] END[facility]COPY;  Service: Endoscopy;  Laterality: N/A;   SKIN CANCER EXCISION     face and neck    FAMILY HISTORY: family history includes Breast cancer in her mother; Colon cancer in her brother; Heart disease in her father.  SOCIAL HISTORY:  reports that she has never smoked. She has never used smokeless tobacco. She reports that she does not drink alcohol and does not use drugs.  ALLERGIES: Patient has no known allergies.  MEDICATIONS:  Current Outpatient Medications  Medication Sig Dispense Refill   acetaminophen (TYLENOL) 500 MG tablet Take 500 mg by mouth every 6 (six) hours as needed.     aspirin EC 81 MG tablet Take 81 mg by mouth daily.     Calcium Acetate-Magnesium Carb (MAGNEBIND 200 PO) Take by mouth.     calcium carbonate 1250 MG capsule Take 1,250 mg by mouth 2 (two) times daily with a meal.     celecoxib (CELEBREX) 200 MG capsule Take 200 mg by mouth 2 (two) times daily.     cholecalciferol (VITAMIN D) 1000 units tablet Take 1,000 Units by mouth daily.     co-enzyme Q-10 30 MG capsule Take 30 mg by mouth 3 (three) times daily.     estradiol (ESTRACE) 1 MG tablet Take 1 tablet (1 mg total) by mouth daily. 90 tablet 3   gabapentin (NEURONTIN) 100 MG capsule Take 100 mg by mouth 3 (three) times  daily.     medroxyPROGESTERone (PROVERA) 5 MG tablet TAKE 1 TABLET BY MOUTH ONCE DAILY ON DAYS 1-12 36 tablet 3   Omega-3 Fatty Acids (FISH OIL) 1000 MG CAPS Take by mouth.     omeprazole (PRILOSEC) 20 MG capsule Take 20 mg by mouth daily.     RESTASIS 0.05 % ophthalmic emulsion      simvastatin (ZOCOR) 10 MG tablet Take 10 mg by mouth daily.     topiramate (TOPAMAX) 50 MG tablet Take 50 mg by mouth daily.     No current facility-administered medications for this encounter.     REVIEW OF SYSTEMS: As above   PHYSICAL EXAM:  General: Alert and oriented, in no acute distress HEENT: Head is normocephalic. Extraocular movements are intact. Musculoskeletal: She is ambulatory and able to get up on the examination table independently Neurologic: No obvious focalities. Speech is fluent. Coordination is intact. Ext: Arthritic changes in hands and erythema of peripheral extremities Psychiatric: Judgment and insight are intact. Affect is appropriate. Breasts: Postbiopsy bruising and swelling in the right breast, no other palpable masses appreciated in the breasts or axillae bilaterally.   ECOG = 0  0 - Asymptomatic (Fully active, able to carry on all predisease activities without restriction)  1 - Symptomatic but completely ambulatory (Restricted in physically strenuous activity but ambulatory and able to carry out work of a light or sedentary nature. For example, light housework, office work)  2 - Symptomatic, <50% in bed during the day (Ambulatory and capable of all self care but unable to carry out any work activities. Up and about more than 50% of waking hours)  3 - Symptomatic, >50% in bed, but not bedbound (Capable of only limited self-care, confined to bed or chair 50% or more of waking hours)  4 - Bedbound (Completely disabled. Cannot carry on any self-care. Totally confined to bed or chair)  5 - Death   Eustace Pen MM, Creech RH, Tormey DC, et al. [phone]). "Toxicity and response criteria of the [facility]". [place] Oncol. 5 (6): 649-55   LABORATORY DATA:  Lab Results  Component Value Date   WBC 6.7 05/22/2020   HGB 11.8 (L) 05/22/2020   HCT 35.7 (L) 05/22/2020   MCV 92.2 05/22/2020   PLT 223 05/22/2020   CMP     Component Value Date/Time   NA 135 05/22/2020 0804   K 4.0 05/22/2020 0804   CL 103 05/22/2020 0804   CO2 25 05/22/2020 0804   GLUCOSE 88 05/22/2020 0804   BUN 16 05/22/2020 0804   CREATININE 1.03 (H) 05/22/2020  0804   CALCIUM 10.0 05/22/2020 0804   PROT 6.5 05/22/2020 0804   ALBUMIN 3.8 05/22/2020 0804   AST 17 05/22/2020 0804   ALT 11 05/22/2020 0804   ALKPHOS 57 05/22/2020 0804   BILITOT 0.3 05/22/2020 0804   GFRNONAA 52 (L) 05/22/2020 0804   GFRAA 60 (L) 05/22/2020 0804      RADIOGRAPHY: As above, I personally reviewed her imaging at tumor board this morning  IMPRESSION/PLAN: Right Breast DCIS   It was a pleasure meeting the patient today. We discussed the risks, benefits, and side effects of radiotherapy. I recommend radiotherapy to the right breast to reduce her risk of locoregional recurrence by 1/2.  We discussed that radiation would take approximately 3-4 weeks to complete and that I would give the patient a few weeks to heal following surgery before starting treatment planning. We spoke about acute effects including skin  irritation and fatigue as well as much less common late effects including internal organ injury or irritation. We spoke about the latest technology that is used to minimize the risk of late effects for patients undergoing radiotherapy to the breast or chest wall. No guarantees of treatment were given. The patient is enthusiastic about proceeding with treatment. I look forward to participating in the patient's care.  I will await her referral back to me for postoperative follow-up and eventual CT simulation/treatment planning.  We did talk about a possible referral to the cancer center at [facility] for her adjuvant radiotherapy.  This is closer to her home.  She would like to keep her treatment here with me.  Breast navigator was notified.  We discussed measures to reduce the risk of infection during the COVID-19 pandemic.  She has been vaccinated.  On date of service, in total, I spent 45 minutes on this encounter. Patient was seen in person.   __________________________________________   Eppie Gibson, MD   This document serves as a record of services personally  performed by Eppie Gibson, MD. It was created on her behalf by Wilburn Mylar, a trained medical scribe. The creation of this record is based on the scribe's personal observations and the provider's statements to them. This document has been checked and approved by the attending provider.

## 2020-05-22 NOTE — Progress Notes (Signed)
Clinical Social Work [place] Psychosocial Distress Screening [place]   Patient completed distress screening protocol and scored a 2 on the Psychosocial Distress Thermometer which indicates mild distress. Clinical Social Worker met with patient and patient's son, [name], in [facility] to assess for distress and other psychosocial needs.  Patient stated she was feeling stressed but felt better after meeting with the treatment team and getting more information on her treatment plan.    CSW and patient discussed common feeling and emotions when being diagnosed with cancer, and the importance of support during treatment.    Barriers to care/review of distress screen:  - Transportation: No anticipated issues - Help at home: lives alone (is widowed) but son and daughter live locally and has strong network at [employer] and through friends   - Support system:  Support from son, Richardson Landry, daughter, [name], church community, and friends.  - Finances: Retired. No current or imminent financial concerns    What is your understanding of where you are with your cancer? Its cause?  Your treatment plan and what happens next? Zema expressed that she is grateful her cancer was caught early. She has a positive mindset and relies on her faith to remain hopeful.   Distress Screen: ONCBCN DISTRESS SCREENING 05/22/2020  Screening Type Initial Screening  Distress experienced in past week (1-10) 2  Emotional problem type Boredom    Clinical Social Worker follow up needed: No. CSW informed patient of the support team and support services at [facility].  CSW provided contact information and encouraged patient to call with any questions or concerns.   [place]

## 2020-05-22 NOTE — Progress Notes (Signed)
[place]  [phone]) [phone] [phone]     ID: Elizabeth Friedman DOB: Nov 19, 1940  MR#: [id number]  OTL#:[id number]  Patient Care Team: Elizabeth Hector, MD as PCP - General (Internal Medicine) Elizabeth Germany, RN as Oncology Nurse Navigator Elizabeth Kaufmann, RN as Oncology Nurse Navigator Elizabeth Bookbinder, MD as Consulting Physician (General Surgery) Elizabeth Friedman, Elizabeth Dad, MD as Consulting Physician (Oncology) Elizabeth Gibson, MD as Attending Physician (Radiation Oncology) Elizabeth Maid, MD as Consulting Physician (Obstetrics and Gynecology) Elizabeth Cruel, MD OTHER MD:  CHIEF COMPLAINT: estrogen receptor positive noninvasive breast cancer  CURRENT TREATMENT: awaiting definitive surgery   HISTORY OF CURRENT ILLNESS: Elizabeth Friedman had routine screening mammography on 04/16/2020 showing a possible abnormality in the bilateral breasts. She underwent bilateral diagnostic mammography with tomography at [facility] on 05/09/2020 showing: breast density category B; 1.5 cm grouped heterogeneous punctate calcifications in right breast; left breast area in question not reproduced.  Accordingly on 05/15/2020 she proceeded to biopsy of the right breast area in question. The pathology from this procedure ([id number]) showed: ductal carcinoma in situ with necrosis and calcifications, high grade. Prognostic indicators significant for: estrogen receptor, 95% positive and progesterone receptor, 90% positive, both with strong staining intensity.   The patient's subsequent history is as detailed below.   INTERVAL HISTORY: Elizabeth Friedman was evaluated in the multidisciplinary breast cancer clinic on 05/22/2020 accompanied by her son Elizabeth Friedman. Her case was also presented at the multidisciplinary breast cancer conference on the same day. At that time a preliminary plan was proposed: Lumpectomy, radiation which the patient may choose to do in [place], and antiestrogens   REVIEW OF SYSTEMS: There  were no specific symptoms leading to the original mammogram, which was routinely scheduled. On the provided questionnaire, Elizabeth Friedman reports wearing glasses and hearing aid(s), runny nose, poor circulation, joint pain, and arthritis. The patient denies unusual headaches, visual changes, nausea, vomiting, stiff neck, dizziness, or gait imbalance. There has been no cough, phlegm production, or pleurisy, no chest pain or pressure, and no change in bowel or bladder habits. The patient denies fever, rash, bleeding, unexplained fatigue or unexplained weight loss. A detailed review of systems was otherwise entirely negative.   PAST MEDICAL HISTORY: Past Medical History:  Diagnosis Date  . Allergic rhinitis   . Arthritis   . Atypical chest pain   . Breast cancer ([place])   . Cancer ([place])    skin  . Fibrocystic breast disease   . GERD (gastroesophageal reflux disease)   . Hyperlipidemia   . Lung nodule   . Menopause   . Migraine   . Migraine    optical migraines  . Plantar fasciitis   . 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 Sciatic leg pain   . Sciatica   . Urinary retention with incomplete bladder emptying   . Vitreous detachment of both eyes     PAST SURGICAL HISTORY: Past Surgical History:  Procedure Laterality Date  . APPENDECTOMY    . CARDIAC CATHETERIZATION    . COLONOSCOPY WITH PROPOFOL N/A 03/27/2016   Procedure: COLONOSCOPY WITH PROPOFOL;  Surgeon: Elizabeth Silvas, MD;  Location: [facility];  Service: Endoscopy;  Laterality: N/A;  . SKIN CANCER EXCISION     face and neck    FAMILY HISTORY: Family History  Problem Relation Age of Onset  . Breast cancer Mother   . Heart disease Father   . Colon cancer Brother   . Diabetes Neg Hx   .  Ovarian cancer Neg Hx    Her father died at age 30 from CVA, MI. Her mother died at age 64 from breast cancer, which she was diagnosed with at age 66.  The patient had one brother, who had a history of colon cancer and died at age  34 from [place].  She had no sisters.  There is no other family history of cancer to her knowledge.   GYNECOLOGIC HISTORY:  No LMP recorded. Patient is postmenopausal. Menarche: 17-17 years old Age at first live birth: 79 years old Elizabeth Friedman 2 LMP "late 68's" Contraceptive: yes, used for approximately 7 years, currently has a pessary in place HRT: yes, has used for many years, was still taking at time of consultation  July 2021 Hysterectomy? no BSO? 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 She is widowed. She lives at home by herself with no pets. Daughter Elizabeth Friedman, age 35, works as a Producer, television/film/video here in [place]. Son Elizabeth Friedman, age 48, works as a Journalist, newspaper in [place]. Elizabeth Friedman has 4 grandchildren and 5 great-grandchildren. She attends a [employer]ic[employer], medical church.    ADVANCED DIRECTIVES: In place.   HEALTH MAINTENANCE: Social History   Tobacco Use  . Smoking status: Never Smoker  . Smokeless tobacco: Never Used  Vaping Use  . Vaping Use: Never used  Substance Use Topics  . Alcohol use: No  . Drug use: No     Colonoscopy: yes, prior with Dr. Tiffany Friedman (retired), due for repeat  PAP: 2014?  Bone density: 2019?   No Known Allergies  Current Outpatient Medications  Medication Sig Dispense Refill  . acetaminophen (TYLENOL) 500 MG tablet Take 500 mg by mouth every 6 (six) hours as needed.    Marland Kitchen aspirin EC 81 MG tablet Take 81 mg by mouth daily.    . Calcium Acetate-Magnesium Carb (MAGNEBIND 200 PO) Take by mouth.    . calcium carbonate 1250 MG capsule Take 1,250 mg by mouth 2 (two) times daily with a meal.    . celecoxib (CELEBREX) 200 MG capsule Take 200 mg by mouth 2 (two) times daily.    . cholecalciferol (VITAMIN D) 1000 units tablet Take 1,000 Units by mouth daily.    Marland Kitchen co-enzyme Q-10 30 MG capsule Take 30 mg by mouth 3 (three) times daily.    Marland Kitchen estradiol (ESTRACE) 1 MG tablet Take 1 tablet (1 mg total) by mouth  daily. 90 tablet 3  . gabapentin (NEURONTIN) 100 MG capsule Take 100 mg by mouth 3 (three) times daily.    . medroxyPROGESTERone (PROVERA) 5 MG tablet TAKE 1 TABLET BY MOUTH ONCE DAILY ON DAYS 1-12 36 tablet 3  . Omega-3 Fatty Acids (FISH OIL) 1000 MG CAPS Take by mouth.    Marland Kitchen omeprazole (PRILOSEC) 20 MG capsule Take 20 mg by mouth daily.    . RESTASIS 0.05 % ophthalmic emulsion     . simvastatin (ZOCOR) 10 MG tablet Take 10 mg by mouth daily.    Marland Kitchen topiramate (TOPAMAX) 50 MG tablet Take 50 mg by mouth daily.     No current facility-administered medications for this visit.    OBJECTIVE: White Friedman who appears younger than stated age  89:   05/22/20 0832  BP: (!) 189/89  Friedman: 64  Resp: 16  Temp: 98.7 F (37.1 C)  SpO2: 100%     Body mass index is 19.9 kg/m.   Wt Readings from Last 3 Encounters:  05/22/20 108 lb 12.8 oz (49.4 kg)  01/11/20 111 lb (50.3 kg)  12/26/19 107 lb 9.6 oz (48.8 kg)      ECOG FS:1 - Symptomatic but completely ambulatory  Ocular: Sclerae unicteric, pupils round and equal Ear-nose-throat: Wearing a mask Lymphatic: No cervical or supraclavicular adenopathy Lungs no rales or rhonchi Heart regular rate and rhythm Abd soft, nontender, positive bowel sounds MSK mild kyphosis but no focal spinal tenderness, no joint edema Neuro: non-focal, well-oriented, appropriate affect Breasts: The right breast is status post recent biopsy.  There is a significant ecchymosis.  The left breast is benign.  Both axillae are benign.   LAB RESULTS:  CMP     Component Value Date/Time   NA 135 05/22/2020 0804   K 4.0 05/22/2020 0804   CL 103 05/22/2020 0804   CO2 25 05/22/2020 0804   GLUCOSE 88 05/22/2020 0804   BUN 16 05/22/2020 0804   CREATININE 1.03 (H) 05/22/2020 0804   CALCIUM 10.0 05/22/2020 0804   PROT 6.5 05/22/2020 0804   ALBUMIN 3.8 05/22/2020 0804   AST 17 05/22/2020 0804   ALT 11 05/22/2020 0804   ALKPHOS 57 05/22/2020 0804   BILITOT 0.3  05/22/2020 0804   GFRNONAA 52 (L) 05/22/2020 0804   GFRAA 60 (L) 05/22/2020 0804    No results found for: TOTALPROTELP, ALBUMINELP, A1GS, A2GS, BETS, BETA2SER, GAMS, MSPIKE, SPEI  Lab Results  Component Value Date   WBC 6.7 05/22/2020   NEUTROABS 4.6 05/22/2020   HGB 11.8 (L) 05/22/2020   HCT 35.7 (L) 05/22/2020   MCV 92.2 05/22/2020   PLT 223 05/22/2020    No results found for: LABCA2  No components found for: ZOXWRU045  No results for input(s): INR in the last 168 hours.  No results found for: LABCA2  No results found for: WUJ811  No results found for: BJY782  No results found for: NFA213  No results found for: CA2729  No components found for: HGQUANT  No results found for: CEA1 / No results found for: CEA1   No results found for: AFPTUMOR  No results found for: CHROMOGRNA  No results found for: KPAFRELGTCHN, LAMBDASER, KAPLAMBRATIO (kappa/lambda light chains)  No results found for: HGBA, HGBA2QUANT, HGBFQUANT, HGBSQUAN (Hemoglobinopathy evaluation)   No results found for: LDH  No results found for: IRON, TIBC, IRONPCTSAT (Iron and TIBC)  No results found for: FERRITIN  Urinalysis No results found for: COLORURINE, APPEARANCEUR, LABSPEC, PHURINE, GLUCOSEU, HGBUR, BILIRUBINUR, KETONESUR, PROTEINUR, UROBILINOGEN, NITRITE, LEUKOCYTESUR   STUDIES: No results found.   ELIGIBLE FOR AVAILABLE RESEARCH PROTOCOL: AET  ASSESSMENT: 79 y.o. Elizabeth Friedman status post right breast biopsy 05/15/2020 for ductal carcinoma in situ, grade 3, strongly estrogen and progesterone receptor positive  (1) definitive surgery pending  (2) adjuvant radiation to follow  (3) consider antiestrogens  (a) the patient has been strongly advised to discontinue hormone replacement   PLAN: I met today with Elizabeth Friedman to review her new diagnosis. Specifically we discussed the biology of her breast cancer, its diagnosis, staging, treatment  options and prognosis.Elizabeth Friedman understands that in  noninvasive ductal carcinoma, also called ductal carcinoma in situ ("DCIS") the breast cancer cells remain trapped in the ducts were they started. They cannot travel to a vital organ. For that reason these cancers in themselves are not life-threatening.  If the whole breast is removed then all the ducts are removed and since the cancer cells are trapped in the ducts, the cure rate with mastectomy for noninvasive breast cancer is approximately  99%. Nevertheless we recommend lumpectomy, because there is no survival advantage to mastectomy and because the cosmetic result is generally superior with breast conservation.  Since the patient is keeping her breasts, there will be some risk of recurrence. The recurrence can only be in the same breast since, again, the cells are trapped in the ducts. There is no connection from one breast to the other. The risk of local recurrence is cut by more than half with radiation, which is standard in this situation.  In estrogen receptor positive cancers like Elizabeth Friedman, anti-estrogens can also be considered. They will further reduce the risk of recurrence by one half. In addition anti-estrogens will lower the risk of a new breast cancer developing in either breast, also by one half. That risk otherwise approaches 1% per year.   Elizabeth Friedman is aware that hormone replacement therapy increases the risk of breast cancer.  It has been recommended that she stop this medication which is "feeding" her estrogen receptor positive tumor.  The overall plan is for surgery, followed by radiation, then a discussion of anti-estrogens.  Elizabeth Friedman has a good understanding of the overall plan. She agrees with it. She knows the goal of treatment in her case is cure. She will call with any problems that may develop before her next visit here.  Total encounter time 65 minutes.Elizabeth Friedman C. Nanako Stopher, MD 05/22/2020 11:45 AM Medical Oncology and Hematology [facility] [place],  [address] Tel. [phone]    Fax. [phone]   This document serves as a record of services personally performed by Lurline Del, MD. It was created on his behalf by Wilburn Mylar, a trained medical scribe. The creation of this record is based on the scribe's personal observations and the provider's statements to them.   I, Lurline Del MD, have reviewed the above documentation for accuracy and completeness, and I agree with the above.    *Total Encounter Time as defined by the Centers for Medicare and Medicaid Services includes, in addition to the face-to-face time of a patient visit (documented in the note above) non-face-to-face time: obtaining and reviewing outside history, ordering and reviewing medications, tests or procedures, care coordination (communications with other health care professionals or caregivers) and documentation in the medical record.

## 2020-05-23 ENCOUNTER — Encounter (HOSPITAL_BASED_OUTPATIENT_CLINIC_OR_DEPARTMENT_OTHER): Payer: Self-pay | Admitting: General Surgery

## 2020-05-23 ENCOUNTER — Other Ambulatory Visit: Payer: Self-pay

## 2020-05-23 ENCOUNTER — Telehealth: Payer: Self-pay | Admitting: Oncology

## 2020-05-23 ENCOUNTER — Telehealth: Payer: Self-pay | Admitting: Obstetrics and Gynecology

## 2020-05-23 NOTE — Telephone Encounter (Signed)
Scheduled appts per 7/28 los. Left voicemail with appt date and time.  

## 2020-05-23 NOTE — Telephone Encounter (Addendum)
Pt called in and stated that she needs a call from the nurse. The pt stated that she had an appt with her Oncology. The pt said that she was told to contact Dr. Marcelline Mates that she needs to be taken off some of her meds. I told the pt to please aloow 24 to 48 hours for a call back,  the pt stated that she has an appt on Wednesday and needs to speak to her before the appt. I told her that I understand that is what we tell each pt who calls in and leaves a telephone message. The pt vernally understood  Please advise

## 2020-05-25 ENCOUNTER — Other Ambulatory Visit (HOSPITAL_COMMUNITY)
Admission: RE | Admit: 2020-05-25 | Discharge: 2020-05-25 | Disposition: A | Discharge status: Home / Self Care | Payer: PPO | Source: Ambulatory Visit | Attending: General Surgery | Admitting: General Surgery

## 2020-05-25 DIAGNOSIS — Z01812 Encounter for preprocedural laboratory examination: Secondary | ICD-10-CM | POA: Diagnosis not present

## 2020-05-25 DIAGNOSIS — Z20822 Contact with and (suspected) exposure to covid-19: Secondary | ICD-10-CM | POA: Diagnosis not present

## 2020-05-25 LAB — SARS CORONAVIRUS 2 (TAT 6-24 HRS): SARS Coronavirus 2: NEGATIVE

## 2020-05-27 NOTE — Telephone Encounter (Signed)
Pt is aware of the information given by [facility].

## 2020-05-27 NOTE — Telephone Encounter (Signed)
Patient called in stating she called in Thursday regarding some medications she's taking that is said "feeding her breast cancer" per her oncologist. Patient states this is very important to her and she would like a call from her provider. Informed patient that her provider was out of the office on Monday's but she should hear something back within 24-48 hours.

## 2020-05-27 NOTE — Telephone Encounter (Signed)
Also sent pt a mychart message with directions on how to wean from the medication.

## 2020-05-27 NOTE — Telephone Encounter (Signed)
Please advise. Thanks Keylon Labelle 

## 2020-05-27 NOTE — Telephone Encounter (Signed)
Pt stated that she was recently diagnosis with breast cancer and the medication progesterone and estrogen is feeding her breast cancer. Pt wanted to know what is her next steps to stop the medication.

## 2020-05-27 NOTE — Telephone Encounter (Signed)
She should wean from the medication, begin with 1/2 tablet for 2 weeks, then 1/2 tablet every other day x 2 weeks, then off. If she begins to have recurring menopausal symptoms, we can try a non-hormonal medication.

## 2020-05-27 NOTE — Telephone Encounter (Signed)
Pt called no answer LM via VM to call the office to speak more concerning her call to the office.

## 2020-05-28 ENCOUNTER — Encounter (HOSPITAL_BASED_OUTPATIENT_CLINIC_OR_DEPARTMENT_OTHER): Payer: Self-pay | Admitting: General Surgery

## 2020-05-28 DIAGNOSIS — D0511 Intraductal carcinoma in situ of right breast: Secondary | ICD-10-CM | POA: Diagnosis not present

## 2020-05-28 NOTE — Progress Notes (Signed)

## 2020-05-29 ENCOUNTER — Ambulatory Visit (HOSPITAL_BASED_OUTPATIENT_CLINIC_OR_DEPARTMENT_OTHER)
Admission: RE | Admit: 2020-05-29 | Discharge: 2020-05-29 | Disposition: A | Discharge status: Home / Self Care | Payer: PPO | Attending: General Surgery | Admitting: General Surgery

## 2020-05-29 ENCOUNTER — Other Ambulatory Visit: Payer: Self-pay

## 2020-05-29 ENCOUNTER — Encounter (HOSPITAL_BASED_OUTPATIENT_CLINIC_OR_DEPARTMENT_OTHER): Payer: Self-pay | Admitting: General Surgery

## 2020-05-29 ENCOUNTER — Ambulatory Visit (HOSPITAL_BASED_OUTPATIENT_CLINIC_OR_DEPARTMENT_OTHER): Payer: PPO | Admitting: Certified Registered Nurse Anesthetist

## 2020-05-29 ENCOUNTER — Encounter (HOSPITAL_BASED_OUTPATIENT_CLINIC_OR_DEPARTMENT_OTHER)
Admission: RE | Disposition: A | Discharge status: Home / Self Care | Payer: Self-pay | Source: Home / Self Care | Attending: General Surgery

## 2020-05-29 DIAGNOSIS — M1990 Unspecified osteoarthritis, unspecified site: Secondary | ICD-10-CM | POA: Diagnosis not present

## 2020-05-29 DIAGNOSIS — I7300 Raynaud's syndrome without gangrene: Secondary | ICD-10-CM | POA: Diagnosis not present

## 2020-05-29 DIAGNOSIS — D0511 Intraductal carcinoma in situ of right breast: Secondary | ICD-10-CM | POA: Insufficient documentation

## 2020-05-29 DIAGNOSIS — K219 Gastro-esophageal reflux disease without esophagitis: Secondary | ICD-10-CM | POA: Diagnosis not present

## 2020-05-29 DIAGNOSIS — Z85828 Personal history of other malignant neoplasm of skin: Secondary | ICD-10-CM | POA: Diagnosis not present

## 2020-05-29 DIAGNOSIS — Z803 Family history of malignant neoplasm of breast: Secondary | ICD-10-CM | POA: Diagnosis not present

## 2020-05-29 DIAGNOSIS — Z170 Estrogen receptor positive status [ER+]: Secondary | ICD-10-CM | POA: Diagnosis not present

## 2020-05-29 DIAGNOSIS — E785 Hyperlipidemia, unspecified: Secondary | ICD-10-CM | POA: Diagnosis not present

## 2020-05-29 DIAGNOSIS — Z853 Personal history of malignant neoplasm of breast: Secondary | ICD-10-CM | POA: Diagnosis not present

## 2020-05-29 DIAGNOSIS — G43909 Migraine, unspecified, not intractable, without status migrainosus: Secondary | ICD-10-CM | POA: Diagnosis not present

## 2020-05-29 HISTORY — DX: Raynaud's syndrome without gangrene: I73.00

## 2020-05-29 HISTORY — PX: BREAST LUMPECTOMY WITH RADIOACTIVE SEED LOCALIZATION: SHX6424

## 2020-05-29 SURGERY — BREAST LUMPECTOMY WITH RADIOACTIVE SEED LOCALIZATION
Anesthesia: General | Site: Breast | Laterality: Right

## 2020-05-29 MED ORDER — BUPIVACAINE HCL (PF) 0.25 % IJ SOLN
INTRAMUSCULAR | Status: DC | PRN
  Administered 2020-05-29: 3.5 mL

## 2020-05-29 MED ORDER — GABAPENTIN 100 MG PO CAPS
ORAL_CAPSULE | ORAL | Status: AC
  Filled 2020-05-29: qty 1

## 2020-05-29 MED ORDER — LACTATED RINGERS IV SOLN: INTRAVENOUS | Status: DC

## 2020-05-29 MED ORDER — PROPOFOL 10 MG/ML IV BOLUS
INTRAVENOUS | Status: DC | PRN
  Administered 2020-05-29: 140 mg via INTRAVENOUS

## 2020-05-29 MED ORDER — ONDANSETRON HCL 4 MG/2ML IJ SOLN
INTRAMUSCULAR | Status: DC | PRN
  Administered 2020-05-29: 4 mg via INTRAVENOUS

## 2020-05-29 MED ORDER — OXYCODONE HCL 5 MG/5ML PO SOLN: 5.0000 mg | Freq: Once | ORAL | Status: DC | PRN

## 2020-05-29 MED ORDER — ACETAMINOPHEN 500 MG PO TABS
1000.0000 mg | ORAL_TABLET | ORAL | Status: AC
  Administered 2020-05-29: 1000 mg via ORAL

## 2020-05-29 MED ORDER — GABAPENTIN 100 MG PO CAPS
100.0000 mg | ORAL_CAPSULE | ORAL | Status: AC
  Administered 2020-05-29: 100 mg via ORAL

## 2020-05-29 MED ORDER — ACETAMINOPHEN 500 MG PO TABS
ORAL_TABLET | ORAL | Status: AC
  Filled 2020-05-29: qty 2

## 2020-05-29 MED ORDER — HYDROMORPHONE HCL 1 MG/ML IJ SOLN: 0.2500 mg | INTRAMUSCULAR | Status: DC | PRN

## 2020-05-29 MED ORDER — CEFAZOLIN SODIUM-DEXTROSE 2-4 GM/100ML-% IV SOLN
INTRAVENOUS | Status: AC
  Filled 2020-05-29: qty 100

## 2020-05-29 MED ORDER — DEXAMETHASONE SODIUM PHOSPHATE 10 MG/ML IJ SOLN
INTRAMUSCULAR | Status: DC | PRN
  Administered 2020-05-29: 5 mg via INTRAVENOUS

## 2020-05-29 MED ORDER — PROPOFOL 10 MG/ML IV BOLUS
INTRAVENOUS | Status: AC
  Filled 2020-05-29: qty 20

## 2020-05-29 MED ORDER — LIDOCAINE 2% (20 MG/ML) 5 ML SYRINGE
INTRAMUSCULAR | Status: AC
  Filled 2020-05-29: qty 5

## 2020-05-29 MED ORDER — AMISULPRIDE (ANTIEMETIC) 5 MG/2ML IV SOLN: 10.0000 mg | Freq: Once | INTRAVENOUS | Status: DC | PRN

## 2020-05-29 MED ORDER — CEFAZOLIN SODIUM-DEXTROSE 2-4 GM/100ML-% IV SOLN
2.0000 g | INTRAVENOUS | Status: AC
  Administered 2020-05-29: 2 g via INTRAVENOUS

## 2020-05-29 MED ORDER — LIDOCAINE 2% (20 MG/ML) 5 ML SYRINGE
INTRAMUSCULAR | Status: DC | PRN
Start: 2020-05-29 — End: 2020-05-29
  Administered 2020-05-29: 60 mg via INTRAVENOUS

## 2020-05-29 MED ORDER — ENSURE PRE-SURGERY PO LIQD: 296.0000 mL | Freq: Once | ORAL | Status: DC

## 2020-05-29 MED ORDER — FENTANYL CITRATE (PF) 100 MCG/2ML IJ SOLN
INTRAMUSCULAR | Status: AC
  Filled 2020-05-29: qty 2

## 2020-05-29 MED ORDER — PROMETHAZINE HCL 25 MG/ML IJ SOLN: 6.2500 mg | INTRAMUSCULAR | Status: DC | PRN

## 2020-05-29 MED ORDER — OXYCODONE HCL 5 MG PO TABS: 5.0000 mg | ORAL_TABLET | Freq: Once | ORAL | Status: DC | PRN

## 2020-05-29 SURGICAL SUPPLY — 63 items
ADH SKN CLS APL DERMABOND .7 (GAUZE/BANDAGES/DRESSINGS) ×1
APL PRP STRL LF DISP 70% ISPRP (MISCELLANEOUS) ×1
APPLIER CLIP 9.375 MED OPEN (MISCELLANEOUS)
APR CLP MED 9.3 20 MLT OPN (MISCELLANEOUS)
BINDER BREAST LRG (GAUZE/BANDAGES/DRESSINGS) IMPLANT
BINDER BREAST MEDIUM (GAUZE/BANDAGES/DRESSINGS) ×3 IMPLANT
BINDER BREAST XLRG (GAUZE/BANDAGES/DRESSINGS) IMPLANT
BINDER BREAST XXLRG (GAUZE/BANDAGES/DRESSINGS) IMPLANT
BLADE SURG 15 STRL LF DISP TIS (BLADE) ×1 IMPLANT
BLADE SURG 15 STRL SS (BLADE) ×3
CANISTER SUC SOCK COL 7IN (MISCELLANEOUS) IMPLANT
CANISTER SUCT 1200ML W/VALVE (MISCELLANEOUS) IMPLANT
CHLORAPREP W/TINT 26 (MISCELLANEOUS) ×3 IMPLANT
CLIP APPLIE 9.375 MED OPEN (MISCELLANEOUS) IMPLANT
CLIP VESOCCLUDE SM WIDE 6/CT (CLIP) ×3 IMPLANT
CLOSURE WOUND 1/2 X4 (GAUZE/BANDAGES/DRESSINGS) ×1
COVER BACK TABLE 60X90IN (DRAPES) ×3 IMPLANT
COVER MAYO STAND STRL (DRAPES) ×3 IMPLANT
COVER PROBE W GEL 5X96 (DRAPES) ×3 IMPLANT
COVER WAND RF STERILE (DRAPES) IMPLANT
DECANTER SPIKE VIAL GLASS SM (MISCELLANEOUS) IMPLANT
DERMABOND ADVANCED (GAUZE/BANDAGES/DRESSINGS) ×2
DERMABOND ADVANCED .7 DNX12 (GAUZE/BANDAGES/DRESSINGS) ×1 IMPLANT
DRAPE LAPAROSCOPIC ABDOMINAL (DRAPES) ×3 IMPLANT
DRAPE UTILITY XL STRL (DRAPES) ×3 IMPLANT
DRSG TEGADERM 4X4.75 (GAUZE/BANDAGES/DRESSINGS) IMPLANT
ELECT COATED BLADE 2.86 ST (ELECTRODE) ×3 IMPLANT
ELECT REM PT RETURN 9FT ADLT (ELECTROSURGICAL) ×3
ELECTRODE REM PT RTRN 9FT ADLT (ELECTROSURGICAL) ×1 IMPLANT
GAUZE SPONGE 4X4 12PLY STRL LF (GAUZE/BANDAGES/DRESSINGS) IMPLANT
GLOVE BIO SURGEON STRL SZ 6.5 (GLOVE) ×4 IMPLANT
GLOVE BIO SURGEON STRL SZ7 (GLOVE) ×6 IMPLANT
GLOVE BIO SURGEONS STRL SZ 6.5 (GLOVE) ×2
GLOVE BIOGEL PI IND STRL 6.5 (GLOVE) ×2 IMPLANT
GLOVE BIOGEL PI IND STRL 7.5 (GLOVE) ×1 IMPLANT
GLOVE BIOGEL PI INDICATOR 6.5 (GLOVE) ×4
GLOVE BIOGEL PI INDICATOR 7.5 (GLOVE) ×2
GOWN STRL REUS W/ TWL LRG LVL3 (GOWN DISPOSABLE) ×3 IMPLANT
GOWN STRL REUS W/TWL LRG LVL3 (GOWN DISPOSABLE) ×9
HEMOSTAT ARISTA ABSORB 3G PWDR (HEMOSTASIS) IMPLANT
KIT MARKER MARGIN INK (KITS) ×3 IMPLANT
NEEDLE HYPO 25X1 1.5 SAFETY (NEEDLE) ×3 IMPLANT
NS IRRIG 1000ML POUR BTL (IV SOLUTION) IMPLANT
PACK BASIN DAY SURGERY FS (CUSTOM PROCEDURE TRAY) ×3 IMPLANT
PENCIL SMOKE EVACUATOR (MISCELLANEOUS) ×3 IMPLANT
RETRACTOR ONETRAX LX 90X20 (MISCELLANEOUS) IMPLANT
SLEEVE SCD COMPRESS KNEE MED (MISCELLANEOUS) ×3 IMPLANT
SPONGE LAP 4X18 RFD (DISPOSABLE) ×3 IMPLANT
STRIP CLOSURE SKIN 1/2X4 (GAUZE/BANDAGES/DRESSINGS) ×2 IMPLANT
SUT MNCRL AB 4-0 PS2 18 (SUTURE) ×3 IMPLANT
SUT MON AB 5-0 PS2 18 (SUTURE) IMPLANT
SUT SILK 2 0 SH (SUTURE) IMPLANT
SUT VIC AB 2-0 SH 27 (SUTURE) ×3
SUT VIC AB 2-0 SH 27XBRD (SUTURE) ×1 IMPLANT
SUT VIC AB 3-0 SH 27 (SUTURE) ×3
SUT VIC AB 3-0 SH 27X BRD (SUTURE) ×1 IMPLANT
SUT VIC AB 5-0 PS2 18 (SUTURE) IMPLANT
SYR CONTROL 10ML LL (SYRINGE) ×3 IMPLANT
TOWEL GREEN STERILE FF (TOWEL DISPOSABLE) ×3 IMPLANT
TRAY FAXITRON CT DISP (TRAY / TRAY PROCEDURE) ×3 IMPLANT
TUBE CONNECTING 20'X1/4 (TUBING)
TUBE CONNECTING 20X1/4 (TUBING) IMPLANT
YANKAUER SUCT BULB TIP NO VENT (SUCTIONS) IMPLANT

## 2020-05-29 NOTE — Transfer of Care (Signed)
Immediate Anesthesia Transfer of Care Note  Patient: Elizabeth Friedman  Procedure(s) Performed: RIGHT BREAST LUMPECTOMY WITH RADIOACTIVE SEED LOCALIZATION (Right Breast)  Patient Location: PACU  Anesthesia Type:General  Level of Consciousness: awake and drowsy  Airway & Oxygen Therapy: Patient Spontanous Breathing and Patient connected to face mask oxygen  Post-op Assessment: Report given to RN and Post -op Vital signs reviewed and stable  Post vital signs: Reviewed and stable  Last Vitals:  Vitals Value Taken Time  BP 129/76 05/29/20 1301  Temp    Pulse 66 05/29/20 1303  Resp 12 05/29/20 1303  SpO2 100 % 05/29/20 1303  Vitals shown include unvalidated device data.  Last Pain:  Vitals:   05/29/20 1101  TempSrc: Oral  PainSc: 0-No pain         Complications: No complications documented.

## 2020-05-29 NOTE — Anesthesia Postprocedure Evaluation (Signed)
Anesthesia Post Note  Patient: Elizabeth Friedman  Procedure(s) Performed: RIGHT BREAST LUMPECTOMY WITH RADIOACTIVE SEED LOCALIZATION (Right Breast)     Patient location during evaluation: PACU Anesthesia Type: General Level of consciousness: awake and alert Pain management: pain level controlled Vital Signs Assessment: post-procedure vital signs reviewed and stable Respiratory status: spontaneous breathing, nonlabored ventilation and respiratory function stable Cardiovascular status: blood pressure returned to baseline and stable Postop Assessment: no apparent nausea or vomiting Anesthetic complications: no   No complications documented.  Last Vitals:  Vitals:   05/29/20 1345 05/29/20 1400  BP: (!) 147/83 130/82  Pulse: 64 71  Resp: 13 16  Temp:  36.4 C  SpO2: 100% 100%    Last Pain:  Vitals:   05/29/20 1400  TempSrc:   PainSc: 0-No pain                 Lynda Rainwater

## 2020-05-29 NOTE — Discharge Instructions (Signed)
Tylenol given at 11:05 today. Next dose not until 5:05 today if needed.   [place] Office Phone Number [phone]  BREAST BIOPSY/ PARTIAL MASTECTOMY: POST OP INSTRUCTIONS Take 400 mg of ibuprofen every 8 hours or 650 mg tylenol every 6 hours for next 72 hours then as needed. Use ice several times daily also. Always review your discharge instruction sheet given to you by the facility where your surgery was performed.  IF YOU HAVE DISABILITY OR FAMILY LEAVE FORMS, YOU MUST BRING THEM TO THE OFFICE FOR PROCESSING.  DO NOT GIVE THEM TO YOUR DOCTOR.  1. A prescription for pain medication may be given to you upon discharge.  Take your pain medication as prescribed, if needed.  If narcotic pain medicine is not needed, then you may take acetaminophen (Tylenol), naprosyn (Alleve) or ibuprofen (Advil) as needed. 2. Take your usually prescribed medications unless otherwise directed 3. If you need a refill on your pain medication, please contact your pharmacy.  They will contact our office to request authorization.  Prescriptions will not be filled after 5pm or on week-ends. 4. You should eat very light the first 24 hours after surgery, such as soup, crackers, pudding, etc.  Resume your normal diet the day after surgery. 5. Most patients will experience some swelling and bruising in the breast.  Ice packs and a good support bra will help.  Wear the breast binder provided or a sports bra for 72 hours day and night.  After that wear a sports bra during the day until you return to the office. Swelling and bruising can take several days to resolve.  6. It is common to experience some constipation if taking pain medication after surgery.  Increasing fluid intake and taking a stool softener will usually help or prevent this problem from occurring.  A mild laxative (Milk of Magnesia or Miralax) should be taken according to package directions if there are no bowel movements after 48  hours. 7. Unless discharge instructions indicate otherwise, you may remove your bandages 48 hours after surgery and you may shower at that time.  You may have steri-strips (small skin tapes) in place directly over the incision.  These strips should be left on the skin for 7-10 days and will come off on their own.  If your surgeon used skin glue on the incision, you may shower in 24 hours.  The glue will flake off over the next 2-3 weeks.  Any sutures or staples will be removed at the office during your follow-up visit. 8. ACTIVITIES:  You may resume regular daily activities (gradually increasing) beginning the next day.  Wearing a good support bra or sports bra minimizes pain and swelling.  You may have sexual intercourse when it is comfortable. a. You may drive when you no longer are taking prescription pain medication, you can comfortably wear a seatbelt, and you can safely maneuver your car and apply brakes. b. RETURN TO WORK:  ______________________________________________________________________________________ 9. You should see your doctor in the office for a follow-up appointment approximately two weeks after your surgery.  Your doctor's nurse will typically make your follow-up appointment when she calls you with your pathology report.  Expect your pathology report 3-4 business days after your surgery.  You may call to check if you do not hear from [place] after three days. 10. OTHER INSTRUCTIONS: _______________________________________________________________________________________________ _____________________________________________________________________________________________________________________________________ _____________________________________________________________________________________________________________________________________ _____________________________________________________________________________________________________________________________________  WHEN TO CALL DR  [place]: 1. Fever over 101.0 2. Nausea and/or vomiting. 3. Extreme swelling or bruising. 4. Continued  bleeding from incision. 5. Increased pain, redness, or drainage from the incision.  The clinic staff is available to answer your questions during regular business hours.  Please don't hesitate to call and ask to speak to one of the nurses for clinical concerns.  If you have a medical emergency, go to the nearest emergency room or call 911.  A surgeon from [facility] is always on call at the hospital.  For further questions, please visit [email or web address]   Post Anesthesia Home Care Instructions  Activity: Get plenty of rest for the remainder of the day. A responsible individual must stay with you for 24 hours following the procedure.  For the next 24 hours, DO NOT: -Drive a car -Paediatric nurse -Drink alcoholic beverages -Take any medication unless instructed by your physician -Make any legal decisions or sign important papers.  Meals: Start with liquid foods such as gelatin or soup. Progress to regular foods as tolerated. Avoid greasy, spicy, heavy foods. If nausea and/or vomiting occur, drink only clear liquids until the nausea and/or vomiting subsides. Call your physician if vomiting continues.  Special Instructions/Symptoms: Your throat may feel dry or sore from the anesthesia or the breathing tube placed in your throat during surgery. If this causes discomfort, gargle with warm salt water. The discomfort should disappear within 24 hours.  If you had a scopolamine patch placed behind your ear for the management of post- operative nausea and/or vomiting:  1. The medication in the patch is effective for 72 hours, after which it should be removed.  Wrap patch in a tissue and discard in the trash. Wash hands thoroughly with soap and water. 2. You may remove the patch earlier than 72 hours if you experience unpleasant side effects which may include dry  mouth, dizziness or visual disturbances. 3. Avoid touching the patch. Wash your hands with soap and water after contact with the patch.

## 2020-05-29 NOTE — Anesthesia Procedure Notes (Signed)
Procedure Name: LMA Insertion Date/Time: 05/29/2020 12:25 PM Performed by: [place] Jamesetta Greenhalgh C, CRNA Pre-anesthesia Checklist: Patient identified, Emergency Drugs available, Suction available and Patient being monitored Patient Re-evaluated:Patient Re-evaluated prior to induction Oxygen Delivery Method: Circle system utilized Preoxygenation: Pre-oxygenation with 100% oxygen Induction Type: IV induction Ventilation: Mask ventilation without difficulty LMA: LMA inserted LMA Size: 4.0 Number of attempts: 1 Airway Equipment and Method: Bite block Placement Confirmation: positive ETCO2 Tube secured with: Tape Dental Injury: Teeth and Oropharynx as per pre-operative assessment

## 2020-05-29 NOTE — Anesthesia Preprocedure Evaluation (Signed)
Anesthesia Evaluation  Patient identified by MRN, date of birth, ID band Patient awake    Reviewed: Allergy & Precautions, NPO status , Patient's Chart, lab work & pertinent test results, reviewed documented beta blocker date and time   Airway Mallampati: II  TM Distance: >3 FB Neck ROM: Full    Dental no notable dental hx. (+) Chipped   Pulmonary    Pulmonary exam normal breath sounds clear to auscultation       Cardiovascular Normal cardiovascular exam Rhythm:Regular Rate:Normal     Neuro/Psych  Headaches,  Neuromuscular disease    GI/Hepatic GERD  ,  Endo/Other    Renal/GU      Musculoskeletal  (+) Arthritis ,   Abdominal   Peds  Hematology   Anesthesia Other Findings Hx of Raynauds. Breast Cancer  Reproductive/Obstetrics                             Anesthesia Physical  Anesthesia Plan  ASA: III  Anesthesia Plan: General   Post-op Pain Management:    Induction: Intravenous  PONV Risk Score and Plan: 3 and Ondansetron, Dexamethasone, Midazolam and Treatment may vary due to age or medical condition  Airway Management Planned: LMA  Additional Equipment:   Intra-op Plan:   Post-operative Plan: Extubation in OR  Informed Consent: I have reviewed the patients History and Physical, chart, labs and discussed the procedure including the risks, benefits and alternatives for the proposed anesthesia with the patient or authorized representative who has indicated his/her understanding and acceptance.     Dental advisory given  Plan Discussed with: CRNA  Anesthesia Plan Comments:         Anesthesia Quick Evaluation

## 2020-05-29 NOTE — Interval H&P Note (Signed)
History and Physical Interval Note:  05/29/2020 11:57 AM  Elizabeth Friedman  has presented today for surgery, with the diagnosis of RIGHT BREAST DUCTAL CARCINOMA IN SITU.  The various methods of treatment have been discussed with the patient and family. After consideration of risks, benefits and other options for treatment, the patient has consented to  Procedure(s): RIGHT BREAST LUMPECTOMY WITH RADIOACTIVE SEED LOCALIZATION (Right) as a surgical intervention.  The patient's history has been reviewed, patient examined, no change in status, stable for surgery.  I have reviewed the patient's chart and labs.  Questions were answered to the patient's satisfaction.     Rolm Bookbinder

## 2020-05-29 NOTE — H&P (Signed)
Elizabeth Friedman is an 79 y.o. female.   Chief Complaint: dcis HPI: . 30 yof presents to [facility] with new diagnosis of dcis. 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 she has fh of mom at age 52 with bca. she has no prior breast history. she had no mass or dc. she had screening mm that shows ruoq calcifications measuring 1.5 cm. biopsy is hg dcis that is er/pr positive. she is here with her son to discuss options   Past Medical History:  Diagnosis Date  . Allergic rhinitis   . Arthritis   . Atypical chest pain   . Breast cancer ([place])   . Cancer ([place])    skin  . Fibrocystic breast disease   . GERD (gastroesophageal reflux disease)   . Hyperlipidemia   . Lung nodule   . Menopause   . Migraine   . Migraine    optical migraines  . Plantar fasciitis   . 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 Raynaud's disease   . Sciatic leg pain   . Sciatica   . Urinary retention with incomplete bladder emptying   . Vitreous detachment of both eyes     Past Surgical History:  Procedure Laterality Date  . APPENDECTOMY    . CARDIAC CATHETERIZATION    . COLONOSCOPY WITH PROPOFOL N/A 03/27/2016   Procedure: COLONOSCOPY WITH PROPOFOL;  Surgeon: Manya Silvas, MD;  Location: [facility] ENDOSCOPY;  Service: Endoscopy;  Laterality: N/A;  . SKIN CANCER EXCISION     face and neck    Family History  Problem Relation Age of Onset  . Breast cancer Mother   . Heart disease Father   . Colon cancer Brother   . Diabetes Neg Hx   . Ovarian cancer Neg Hx    Social History:  reports that she has never smoked. She has never used smokeless tobacco. She reports that she does not drink alcohol and does not use drugs.  Allergies: No Known Allergies  No medications prior to admission.    No results found for this or any previous visit (from the past 48 hour(s)). No results found.  Review of Systems Negative  Height 5\' 2"  (1.575 m), weight 48.5 kg. Physical Exam  General Mental  Status-Alert. Orientation-Oriented X3. Breast Nipples-No Discharge. Breast Lump-No Palpable Breast Mass. Lymphatic Head & Neck General Head & Neck Lymphatics: Bilateral - Description - Normal. Axillary General Axillary Region: Bilateral - Description - Normal. Note: no McNary adenopathy   Assessment/Plan DUCTAL CARCINOMA IN SITU (DCIS) OF RIGHT BREAST (D05.11) Story: Right breast seed guided lumpectomy We discussed the staging and pathophysiology of breast cancer. We discussed all of the different options for treatment for breast cancer including surgery, radiotherapy and antiestrogens. No indication for any nodal surgery given dcis. We discussed the options for treatment of the breast cancer which included lumpectomy versus a mastectomy. We discussed the performance of the lumpectomy. We discussed a 5-10% chance of a positive margin requiring reexcision in the operating room. We also discussed that she will need radiation therapy if she undergoes lumpectomy. The breast cannot undergo more radiation therapy in the same breast after lumpectomy in the future. We discussed mastectomy and the postoperative care for that as well. Mastectomy can be followed by reconstruction. The decision for lumpectomy vs mastectomy has no impact on decision for chemotherapy. Most mastectomy patients will not need radiation therapy. We discussed that there is no difference in her survival whether she undergoes  lumpectomy with radiation therapy or antiestrogen therapy versus a mastectomy. There is also no real difference between her recurrence in the breast. We discussed the risks of operation including bleeding, infection, possible reoperation. She understands her further therapy will be based on what her stages at the time of her operation.  Rolm Bookbinder, MD 05/29/2020, 9:23 AM

## 2020-05-29 NOTE — Op Note (Signed)
Preoperative diagnosis: Right breast ductal carcinoma in situ Postoperative diagnosis: Same as above Procedure: Right breast radioactive seed guided lumpectomy Surgeon: Dr. 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  Biopsy is ER/PR positive high-grade DCIS.  I saw her in the [facility] and we elected to proceed with lumpectomy.  She had a radioactive seed placed prior to beginning.  I had these films available in the operating room.  Procedure: After informed consent was obtained the patient was taken to the operating room.  She was placed under general anesthesia without complication.  She was prepped and draped in the standard sterile surgical fashion.  Surgical timeout was then performed.  I infiltrated Marcaine in the region of the radioactive seed.  I then made a periareolar scar in order to hide it later.  I then used cautery to dissect of the lesion and remove the seed and the surrounding tissue in order to get a clear margin.  Mammogram confirmed removal of the clip and the seed.  My margins appeared to be clear.  I then obtained hemostasis.  I closed the breast tissue with 2-0 Vicryl after mobilizing it.  I closed it with 3-0 Vicryl and 5-0 Monocryl.  Glue and Steri-Strips were applied.  She tolerated this well was extubated and transferred to recovery stable.

## 2020-05-30 ENCOUNTER — Encounter: Payer: Self-pay | Admitting: *Deleted

## 2020-05-30 ENCOUNTER — Encounter (HOSPITAL_BASED_OUTPATIENT_CLINIC_OR_DEPARTMENT_OTHER): Payer: Self-pay | Admitting: General Surgery

## 2020-05-30 ENCOUNTER — Telehealth: Payer: Self-pay | Admitting: *Deleted

## 2020-05-30 DIAGNOSIS — D0511 Intraductal carcinoma in situ of right breast: Secondary | ICD-10-CM

## 2020-05-30 NOTE — Telephone Encounter (Signed)
Left vm regarding [facility] from 7.28.21. Contact information provided for questions or needs. Informed next step is xrt with Dr. Isidore Moos.

## 2020-06-03 ENCOUNTER — Other Ambulatory Visit: Payer: Self-pay | Admitting: Oncology

## 2020-06-03 LAB — SURGICAL PATHOLOGY

## 2020-06-04 ENCOUNTER — Encounter: Payer: Self-pay | Admitting: *Deleted

## 2020-06-05 ENCOUNTER — Telehealth: Payer: Self-pay

## 2020-06-05 NOTE — Telephone Encounter (Signed)
Nutrition Assessment  Reason for Assessment:  Pt attended Breast Clinic on 05/22/2020 and was given nutrition packet by nurse navigator with RD contact information.  ASSESSMENT:  79 year old female with new breast cancer.  S/p right lumpectomy on 8/4.  Planning radiation and antiestrogens.  Past medical history reviewed.  Spoke with patient via phone to introduce self and service at [facility].  Patient reports good appetite. Lives alone but does cook for herself. Reports likes all foods including sweets.  Reports has been on a medication that she thinks suppresses her appetite some but she still eats and enjoys eating.    Medications:  reviewed  Labs: reviewed  Anthropometrics:   Height: 62 inches Weight: 104 lb 4.4 oz BMI: 19  Thinks she may have lost a few pounds due to medication   NUTRITION DIAGNOSIS: Food and nutrition related knowledge deficit related to new diagnosis of breast cancer as evidenced by no prior need for nutrition related information.  INTERVENTION:   Discussed briefly packet of information regarding nutritional tips for breast cancer patients.  Questions answered.  Contact information provided and patient knows to contact me with questions/concerns.    MONITORING, EVALUATION, and GOAL: Pt will consume a healthy plant based diet to maintain lean body mass throughout treatment.   Miyuki Rzasa B. Zenia Resides, [place], [place] Registered Dietitian [phone] (mobile)

## 2020-06-11 ENCOUNTER — Encounter: Payer: Self-pay | Admitting: *Deleted

## 2020-06-17 NOTE — Progress Notes (Signed)
Location of Breast Cancer: Ductal carcinoma in situ (DCIS) of RIGHT breast  Histology per Pathology Report:  FINAL MICROSCOPIC DIAGNOSIS:  A. BREAST, RIGHT, LUMPECTOMY:  - High-grade ductal carcinoma in situ with necrosis and calcifications,  2.2 cm.  - DCIS focally less than 0.1 cm from posterior, inferior, medial and  lateral margins.  - Biopsy site and biopsy clip  Receptor Status: ER(95%), PR (90%)  Did patient present with symptoms (if so, please note symptoms) or was this found on screening mammography?:  Had routine screening mammography on 04/16/2020 showing a possible abnormality in the bilateral breasts. She underwent bilateral diagnostic mammography with tomography at [facility] on 05/09/2020 showing: breast density category B; 1.5 cm grouped heterogeneous punctate calcifications in right breast; left breast area in question not reproduced  Past/Anticipated interventions by surgeon, if any: 05/29/2020 Dr. Rolm Bookbinder Right breast radioactive seed guided lumpectomy  Past/Anticipated interventions by medical oncology, if any:  Under care of Dr. Lurline Del (1) definitive surgery pending (2) adjuvant radiation to follow (3) consider antiestrogens             (a) the patient has been strongly advised to discontinue hormone replacement  Lymphedema issues, if any:  None     Pain issues, if any:  Patient denies   SAFETY ISSUES:  Prior radiation? No  Pacemaker/ICD? No  Possible current pregnancy? No--post menopausal   Is the patient on methotrexate? No  Current Complaints / other details:  Saw surgeon's PA last Friday 06/14/2020 due to some continued drainage at lumpectomy incision. PA noted that suture was loose but stated area looked healthy/normal. She advised patient to just keep area covered with a dry dressing and monitor for S/S infection. She will F/U with Dr. Donne Hazel this coming Friday 06/21/2020

## 2020-06-18 ENCOUNTER — Ambulatory Visit
Admission: RE | Admit: 2020-06-18 | Discharge: 2020-06-18 | Disposition: A | Discharge status: Home / Self Care | Payer: PPO | Source: Ambulatory Visit | Attending: Radiation Oncology | Admitting: Radiation Oncology

## 2020-06-18 ENCOUNTER — Other Ambulatory Visit: Payer: Self-pay

## 2020-06-18 ENCOUNTER — Encounter: Payer: Self-pay | Admitting: Radiation Oncology

## 2020-06-18 VITALS — BP 166/86 | HR 67 | Temp 97.7°F | Resp 18 | Ht 62.0 in | Wt 106.5 lb

## 2020-06-18 DIAGNOSIS — Z9889 Other specified postprocedural states: Secondary | ICD-10-CM | POA: Diagnosis not present

## 2020-06-18 DIAGNOSIS — D0511 Intraductal carcinoma in situ of right breast: Secondary | ICD-10-CM | POA: Insufficient documentation

## 2020-06-18 DIAGNOSIS — Z923 Personal history of irradiation: Secondary | ICD-10-CM | POA: Diagnosis not present

## 2020-06-18 DIAGNOSIS — Z79899 Other long term (current) drug therapy: Secondary | ICD-10-CM | POA: Diagnosis not present

## 2020-06-18 DIAGNOSIS — Z170 Estrogen receptor positive status [ER+]: Secondary | ICD-10-CM | POA: Insufficient documentation

## 2020-06-18 DIAGNOSIS — Z7982 Long term (current) use of aspirin: Secondary | ICD-10-CM | POA: Insufficient documentation

## 2020-06-18 NOTE — Progress Notes (Signed)
Radiation Oncology         [phone] ________________________________  Name: Elizabeth Friedman MRN: [id number]  Date: 06/18/2020  DOB: 05/17/1941  Follow-Up Visit Note  Outpatient  CC: Adin Hector, MD  Magrinat, Virgie Dad, MD  Diagnosis:      ICD-10-CM   1. Ductal carcinoma in situ (DCIS) of right breast  D05.11    Cancer Staging Ductal carcinoma in situ (DCIS) of right breast Staging form: Breast, AJCC 8th Edition - Clinical stage from 05/22/2020: Stage 0 (cTis (DCIS), cN0, cM0, ER+, PR+) - Unsigned  CHIEF COMPLAINT: Here to discuss management of DCIS, right breast  Narrative:  The patient returns today for follow-up.  She is status post lumpectomy on 05-29-20 with close margins as follows:  A. BREAST, RIGHT, LUMPECTOMY:  - High-grade ductal carcinoma in situ with necrosis and calcifications,  2.2 cm.  - DCIS focally less than 0.1 cm from posterior, inferior, medial and  lateral margins.    Receptor Status: ER(95%), PR (90%)   She has been discussed amongst Dr Jana Hakim, Dr. Donne Hazel, and me.   She is a candidates for reexcision vs proceeding with adjuvant therapy with RT and antiestrogens. She is tapering off hormone replacement.  Lymphedema issues, if any:  None     Pain issues, if any:  Patient denies   SAFETY ISSUES:  Prior radiation? No  Pacemaker/ICD? No  Possible current pregnancy? No--post menopausal   Is the patient on methotrexate? No  Current Complaints / other details:  Saw surgeon's PA last Friday 06/14/2020 due to some continued drainage at lumpectomy incision. PA noted that suture was loose but stated area looked healthy/normal. She advised patient to just keep area covered with a dry dressing and monitor for S/S infection. She will F/U with Dr. Donne Hazel this coming Friday 06/21/2020           ALLERGIES:  has No Known Allergies.  Meds: Current Outpatient Medications  Medication Sig Dispense Refill  . acetaminophen (TYLENOL) 500 MG  tablet Take 500 mg by mouth every 6 (six) hours as needed.    Marland Kitchen aspirin EC 81 MG tablet Take 81 mg by mouth daily.    . Calcium Acetate-Magnesium Carb (MAGNEBIND 200 PO) Take by mouth.    . calcium carbonate 1250 MG capsule Take 1,250 mg by mouth 2 (two) times daily with a meal.    . celecoxib (CELEBREX) 200 MG capsule Take 200 mg by mouth 2 (two) times daily.    . cholecalciferol (VITAMIN D) 1000 units tablet Take 1,000 Units by mouth daily.    Marland Kitchen co-enzyme Q-10 30 MG capsule Take 30 mg by mouth 3 (three) times daily.    Marland Kitchen estradiol (ESTRACE) 1 MG tablet Take 1 tablet (1 mg total) by mouth daily. 90 tablet 3  . gabapentin (NEURONTIN) 100 MG capsule Take 100 mg by mouth 3 (three) times daily.    . Omega-3 Fatty Acids (FISH OIL) 1000 MG CAPS Take by mouth.    Marland Kitchen omeprazole (PRILOSEC) 20 MG capsule Take 20 mg by mouth daily.    . RESTASIS 0.05 % ophthalmic emulsion     . simvastatin (ZOCOR) 10 MG tablet Take 10 mg by mouth daily.    Marland Kitchen topiramate (TOPAMAX) 50 MG tablet Take 50 mg by mouth daily.    . fluticasone (FLONASE) 50 MCG/ACT nasal spray Place 1 spray into both nostrils as needed. (Patient not taking: Reported on 06/18/2020)     No current facility-administered medications for this  encounter.    Physical Findings:  height is 5\' 2"  (1.575 m) and weight is 106 lb 8 oz (48.3 kg). Her oral temperature is 97.7 F (36.5 C). Her blood pressure is 166/86 (abnormal) and her pulse is 67. Her respiration is 18 and oxygen saturation is 100%. .     General: Alert and oriented, in no acute distress Psychiatric: Judgment and insight are intact. Affect is appropriate. Breast exam reveals pink lumpectomy scar, right breast, with healing scab.   Lab Findings: Lab Results  Component Value Date   WBC 6.7 05/22/2020   HGB 11.8 (L) 05/22/2020   HCT 35.7 (L) 05/22/2020   MCV 92.2 05/22/2020   PLT 223 05/22/2020     Radiographic Findings: No results found.  Impression/Plan: This is a lovely 79 yo  woman with DCIS, right breast.  I have discussed her images with radiology and it is likely that all suspicious calcifications were removed. She does, however, have close margins.    I explained that re-excision would probably reduce her risk of local recurrence marginally, but her risk of local recurrence would be small if she purses both RT and antiestrogens alone. She understands that ideally, we strive for wider margins, but she understands there are risks of pursuing re-excision.  She is comfortable with pursuing radiation and antiestrogens and declining reexcision.  I think this is reasonable.  We discussed adjuvant radiotherapy today.  I recommend 4 weeks of hyperfractionated radiotherapy in order to reduce risk of local recurrence by half.  I reviewed the logistics, benefits, risks, and potential side effects of this treatment in detail. Risks may include but not necessary be limited to acute and late injury tissue in the radiation fields such as skin irritation (change in color/pigmentation, itching, dryness, pain, peeling). She may experience fatigue.  There is also a smaller risk for lung toxicity, musculoskeletal changes, rib fragility, late chronic non-healing soft tissue wound.    The patient asked good questions which I answered to her satisfaction. She is enthusiastic about proceeding with treatment. A consent form has been signed and placed in her chart.  We will proceed with CT simulation today. Will start RT in 2 weeks to allow a little more time for healing.  She wishes to start as soon as reasonably possible, due to family plans in October.  On date of service, in total, I spent 35 minutes on this encounter. Patient was seen in person.  _____________________________________   Eppie Gibson, MD

## 2020-06-20 ENCOUNTER — Encounter: Payer: Self-pay | Admitting: *Deleted

## 2020-06-24 DIAGNOSIS — D0511 Intraductal carcinoma in situ of right breast: Secondary | ICD-10-CM | POA: Diagnosis not present

## 2020-07-03 ENCOUNTER — Other Ambulatory Visit: Payer: Self-pay

## 2020-07-03 ENCOUNTER — Ambulatory Visit
Admission: RE | Admit: 2020-07-03 | Discharge: 2020-07-03 | Disposition: A | Discharge status: Home / Self Care | Payer: PPO | Source: Ambulatory Visit | Attending: Radiation Oncology | Admitting: Radiation Oncology

## 2020-07-03 DIAGNOSIS — D0511 Intraductal carcinoma in situ of right breast: Secondary | ICD-10-CM | POA: Diagnosis not present

## 2020-07-03 DIAGNOSIS — Z510 Encounter for antineoplastic radiation therapy: Secondary | ICD-10-CM | POA: Diagnosis not present

## 2020-07-03 DIAGNOSIS — Z170 Estrogen receptor positive status [ER+]: Secondary | ICD-10-CM | POA: Insufficient documentation

## 2020-07-04 ENCOUNTER — Ambulatory Visit
Admission: RE | Admit: 2020-07-04 | Discharge: 2020-07-04 | Disposition: A | Discharge status: Home / Self Care | Payer: PPO | Source: Ambulatory Visit | Attending: Radiation Oncology | Admitting: Radiation Oncology

## 2020-07-04 ENCOUNTER — Other Ambulatory Visit: Payer: Self-pay

## 2020-07-04 DIAGNOSIS — Z510 Encounter for antineoplastic radiation therapy: Secondary | ICD-10-CM | POA: Diagnosis not present

## 2020-07-04 DIAGNOSIS — D0511 Intraductal carcinoma in situ of right breast: Secondary | ICD-10-CM | POA: Diagnosis not present

## 2020-07-05 ENCOUNTER — Other Ambulatory Visit: Payer: Self-pay

## 2020-07-05 ENCOUNTER — Ambulatory Visit
Admission: RE | Admit: 2020-07-05 | Discharge: 2020-07-05 | Disposition: A | Discharge status: Home / Self Care | Payer: PPO | Source: Ambulatory Visit | Attending: Radiation Oncology | Admitting: Radiation Oncology

## 2020-07-05 DIAGNOSIS — D0511 Intraductal carcinoma in situ of right breast: Secondary | ICD-10-CM

## 2020-07-05 DIAGNOSIS — Z510 Encounter for antineoplastic radiation therapy: Secondary | ICD-10-CM | POA: Diagnosis not present

## 2020-07-05 MED ORDER — ALRA NON-METALLIC DEODORANT (RAD-ONC)
1.0000 "application " | Freq: Once | TOPICAL | Status: AC
  Administered 2020-07-05: 1 via TOPICAL

## 2020-07-05 MED ORDER — RADIAPLEXRX EX GEL: Freq: Once | CUTANEOUS | Status: AC

## 2020-07-05 NOTE — Progress Notes (Signed)
Pt here for patient teaching.    Pt given Radiation and You booklet, skin care instructions, Alra deodorant and Radiaplex gel.    Reviewed areas of pertinence such as fatigue, hair loss, skin changes, breast tenderness and breast swelling .   Pt able to give teach back of to pat skin, use unscented/gentle soap, drink plenty of water and sitz bath,apply Radiaplex bid, avoid applying anything to skin within 4 hours of treatment, avoid wearing an under wire bra and to use an electric razor if they must shave.   Pt demonstrated understanding and verbalizes understanding of information given and will contact nursing with any questions or concerns.    [email or web address]

## 2020-07-08 ENCOUNTER — Other Ambulatory Visit: Payer: Self-pay

## 2020-07-08 ENCOUNTER — Ambulatory Visit
Admission: RE | Admit: 2020-07-08 | Discharge: 2020-07-08 | Disposition: A | Discharge status: Home / Self Care | Payer: PPO | Source: Ambulatory Visit | Attending: Radiation Oncology | Admitting: Radiation Oncology

## 2020-07-08 DIAGNOSIS — D0511 Intraductal carcinoma in situ of right breast: Secondary | ICD-10-CM | POA: Diagnosis not present

## 2020-07-08 DIAGNOSIS — Z510 Encounter for antineoplastic radiation therapy: Secondary | ICD-10-CM | POA: Diagnosis not present

## 2020-07-09 ENCOUNTER — Ambulatory Visit
Admission: RE | Admit: 2020-07-09 | Discharge: 2020-07-09 | Disposition: A | Discharge status: Home / Self Care | Payer: PPO | Source: Ambulatory Visit | Attending: Radiation Oncology | Admitting: Radiation Oncology

## 2020-07-09 ENCOUNTER — Other Ambulatory Visit: Payer: Self-pay

## 2020-07-09 DIAGNOSIS — D0511 Intraductal carcinoma in situ of right breast: Secondary | ICD-10-CM | POA: Diagnosis not present

## 2020-07-09 DIAGNOSIS — Z510 Encounter for antineoplastic radiation therapy: Secondary | ICD-10-CM | POA: Diagnosis not present

## 2020-07-10 ENCOUNTER — Ambulatory Visit
Admission: RE | Admit: 2020-07-10 | Discharge: 2020-07-10 | Disposition: A | Discharge status: Home / Self Care | Payer: PPO | Source: Ambulatory Visit | Attending: Radiation Oncology | Admitting: Radiation Oncology

## 2020-07-10 ENCOUNTER — Other Ambulatory Visit: Payer: Self-pay

## 2020-07-10 DIAGNOSIS — D0511 Intraductal carcinoma in situ of right breast: Secondary | ICD-10-CM | POA: Diagnosis not present

## 2020-07-10 DIAGNOSIS — Z510 Encounter for antineoplastic radiation therapy: Secondary | ICD-10-CM | POA: Diagnosis not present

## 2020-07-11 ENCOUNTER — Ambulatory Visit
Admission: RE | Admit: 2020-07-11 | Discharge: 2020-07-11 | Disposition: A | Discharge status: Home / Self Care | Payer: PPO | Source: Ambulatory Visit | Attending: Radiation Oncology | Admitting: Radiation Oncology

## 2020-07-11 ENCOUNTER — Other Ambulatory Visit: Payer: Self-pay

## 2020-07-11 DIAGNOSIS — D0511 Intraductal carcinoma in situ of right breast: Secondary | ICD-10-CM | POA: Diagnosis not present

## 2020-07-11 DIAGNOSIS — Z510 Encounter for antineoplastic radiation therapy: Secondary | ICD-10-CM | POA: Diagnosis not present

## 2020-07-12 ENCOUNTER — Ambulatory Visit
Admission: RE | Admit: 2020-07-12 | Discharge: 2020-07-12 | Disposition: A | Discharge status: Home / Self Care | Payer: PPO | Source: Ambulatory Visit | Attending: Radiation Oncology | Admitting: Radiation Oncology

## 2020-07-12 ENCOUNTER — Other Ambulatory Visit: Payer: Self-pay

## 2020-07-12 DIAGNOSIS — D0511 Intraductal carcinoma in situ of right breast: Secondary | ICD-10-CM | POA: Diagnosis not present

## 2020-07-12 DIAGNOSIS — Z510 Encounter for antineoplastic radiation therapy: Secondary | ICD-10-CM | POA: Diagnosis not present

## 2020-07-15 ENCOUNTER — Ambulatory Visit
Admission: RE | Admit: 2020-07-15 | Discharge: 2020-07-15 | Disposition: A | Discharge status: Home / Self Care | Payer: PPO | Source: Ambulatory Visit | Attending: Radiation Oncology | Admitting: Radiation Oncology

## 2020-07-15 ENCOUNTER — Other Ambulatory Visit: Payer: Self-pay

## 2020-07-15 DIAGNOSIS — Z510 Encounter for antineoplastic radiation therapy: Secondary | ICD-10-CM | POA: Diagnosis not present

## 2020-07-15 DIAGNOSIS — D0511 Intraductal carcinoma in situ of right breast: Secondary | ICD-10-CM | POA: Diagnosis not present

## 2020-07-16 ENCOUNTER — Other Ambulatory Visit: Payer: Self-pay

## 2020-07-16 ENCOUNTER — Ambulatory Visit
Admission: RE | Admit: 2020-07-16 | Discharge: 2020-07-16 | Disposition: A | Discharge status: Home / Self Care | Payer: PPO | Source: Ambulatory Visit | Attending: Radiation Oncology | Admitting: Radiation Oncology

## 2020-07-16 DIAGNOSIS — Z510 Encounter for antineoplastic radiation therapy: Secondary | ICD-10-CM | POA: Diagnosis not present

## 2020-07-16 DIAGNOSIS — D0511 Intraductal carcinoma in situ of right breast: Secondary | ICD-10-CM | POA: Diagnosis not present

## 2020-07-17 ENCOUNTER — Ambulatory Visit
Admission: RE | Admit: 2020-07-17 | Discharge: 2020-07-17 | Disposition: A | Discharge status: Home / Self Care | Payer: PPO | Source: Ambulatory Visit | Attending: Radiation Oncology | Admitting: Radiation Oncology

## 2020-07-17 ENCOUNTER — Other Ambulatory Visit: Payer: Self-pay

## 2020-07-17 ENCOUNTER — Inpatient Hospital Stay: Payer: PPO | Attending: Oncology | Admitting: Oncology

## 2020-07-17 VITALS — BP 128/66 | HR 81 | Temp 97.2°F | Resp 18 | Ht 62.0 in | Wt 110.5 lb

## 2020-07-17 DIAGNOSIS — Z923 Personal history of irradiation: Secondary | ICD-10-CM | POA: Insufficient documentation

## 2020-07-17 DIAGNOSIS — Z170 Estrogen receptor positive status [ER+]: Secondary | ICD-10-CM | POA: Diagnosis not present

## 2020-07-17 DIAGNOSIS — Z510 Encounter for antineoplastic radiation therapy: Secondary | ICD-10-CM | POA: Diagnosis not present

## 2020-07-17 DIAGNOSIS — D0511 Intraductal carcinoma in situ of right breast: Secondary | ICD-10-CM

## 2020-07-17 DIAGNOSIS — Z79899 Other long term (current) drug therapy: Secondary | ICD-10-CM | POA: Insufficient documentation

## 2020-07-17 DIAGNOSIS — I7300 Raynaud's syndrome without gangrene: Secondary | ICD-10-CM | POA: Insufficient documentation

## 2020-07-17 NOTE — Progress Notes (Signed)
[place]  Telephone:([phone]) [phone] Fax:(336) [phone]     ID: Elizabeth Friedman DOB: 1941-03-09  MR#: [id number]  KGU#:[id number]  Patient Care Team: Adin Hector, MD as PCP - General (Internal Medicine) Rockwell Germany, RN as Oncology Nurse Navigator Mauro Kaufmann, RN as Oncology Nurse Navigator Rolm Bookbinder, MD as Consulting Physician (General Surgery) Diannah Rindfleisch, Virgie Dad, MD as Consulting Physician (Oncology) Eppie Gibson, MD as Attending Physician (Radiation Oncology) Rubie Maid, MD as Consulting Physician (Obstetrics and Gynecology) Chauncey Cruel, MD OTHER MD:  CHIEF COMPLAINT: estrogen receptor positive noninvasive breast cancer  CURRENT TREATMENT: adjuvant radiation therapy   INTERVAL HISTORY: Elizabeth Friedman returns for follow up of her noninvasive breast cancer. She was evaluated in the multidisciplinary breast cancer clinic on 05/22/2020.  She opted to proceed with right lumpectomy on 05/29/2020 under Dr. Donne Hazel. Pathology from the procedure [id number]) showed: ductal carcinoma in situ, high grade, 2.2 cm, with necrosis and calcifications; DCIS focally less than 0.1 cm from all margins.  She was referred back to Dr. Isidore Moos on 06/18/2020 to discuss radiation therapy. She began treatment on 07/03/2020 and is scheduled through 07/30/2020.   REVIEW OF SYSTEMS: Elizabeth Friedman went off her estradiol pills at the last visit.  She has had no obvious symptoms from discontinuation about unpredictably no hot flashes no significant worsening of vaginal dryness problems and really no other changes that she is aware of.  She does have Raynaud's phenomenon which is not new.  She has some peripheral neuropathy.  She felt like her skin was on fire 1day this week for reasons that are not clear.  She took an Aleve for that and it took care of it.  Aside from that a detailed review of systems today was stable.   HISTORY OF CURRENT ILLNESS: From the original intake  note:  Elizabeth Friedman had routine screening mammography on 04/16/2020 showing a possible abnormality in the bilateral breasts. She underwent bilateral diagnostic mammography with tomography at [facility] on 05/09/2020 showing: breast density category B; 1.5 cm grouped heterogeneous punctate calcifications in right breast; left breast area in question not reproduced.  Accordingly on 05/15/2020 she proceeded to biopsy of the right breast area in question. The pathology from this procedure ([id number]) showed: ductal carcinoma in situ with necrosis and calcifications, high grade. Prognostic indicators significant for: estrogen receptor, 95% positive and progesterone receptor, 90% positive, both with strong staining intensity.   The patient's subsequent history is as detailed below.   PAST MEDICAL HISTORY: Past Medical History:  Diagnosis Date  . Allergic rhinitis   . Arthritis   . Atypical chest pain   . Breast cancer ([place])   . Cancer ([place])    skin  . Fibrocystic breast disease   . GERD (gastroesophageal reflux disease)   . Hyperlipidemia   . Lung nodule   . Menopause   . Migraine   . Migraine    optical migraines  . Plantar fasciitis   . 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 Raynaud's disease   . Sciatic leg pain   . Sciatica   . Urinary retention with incomplete bladder emptying   . Vitreous detachment of both eyes     PAST SURGICAL HISTORY: Past Surgical History:  Procedure Laterality Date  . APPENDECTOMY    . BREAST LUMPECTOMY WITH RADIOACTIVE SEED LOCALIZATION Right 05/29/2020   Procedure: RIGHT BREAST LUMPECTOMY WITH RADIOACTIVE SEED LOCALIZATION;  Surgeon: Rolm Bookbinder, MD;  Location: [place];  Service: General;  Laterality: Right;  . CARDIAC CATHETERIZATION    . COLONOSCOPY WITH PROPOFOL N/A 03/27/2016   Procedure: COLONOSCOPY WITH PROPOFOL;  Surgeon: Manya Silvas, MD;  Location[facility] ENDOSCOPY;  Service: Endoscopy;  Laterality: N/A;  . SKIN  CANCER EXCISION     face and neck    FAMILY HISTORY: Family History  Problem Relation Age of Onset  . Breast cancer Mother   . Heart disease Father   . Colon cancer Brother   . Diabetes Neg Hx   . Ovarian cancer Neg Hx    Her father died at age 55 from CVA, MI. Her mother died at age 47 from breast cancer, which she was diagnosed with at age 56.  The patient had one brother, who had a history of colon cancer and died at age 46 from [place].  She had no sisters.  There is no other family history of cancer to her knowledge.   GYNECOLOGIC HISTORY:  No LMP recorded. Patient is postmenopausal. Menarche: [age] years old Age at first live birth: 79 years old [place] P 2 LMP "late 79's" Contraceptive: yes, used for approximately 7 years, currently has a pessary in place HRT: yes, has used for many years, was still taking at time of consultation  July 2021 Hysterectomy? no BSO? 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 She is widowed. She lives at home by herself with no pets. Daughter Elizabeth Friedman, age 35, works as a Producer, television/film/video here in [place]. Son Elizabeth Friedman, age 76, works as a Journalist, newspaper in [place]. Elizabeth Friedman has 4 grandchildren and 5 great-grandchildren. She attends a [employer]ic[employer], medical church.    ADVANCED DIRECTIVES: In place.   HEALTH MAINTENANCE: Social History   Tobacco Use  . Smoking status: Never Smoker  . Smokeless tobacco: Never Used  Vaping Use  . Vaping Use: Never used  Substance Use Topics  . Alcohol use: No  . Drug use: No     Colonoscopy: yes, prior with Dr. Tiffany Kocher (retired), due for repeat  PAP: 2014?  Bone density: 2019?   No Known Allergies  Current Outpatient Medications  Medication Sig Dispense Refill  . acetaminophen (TYLENOL) 500 MG tablet Take 500 mg by mouth every 6 (six) hours as needed.    Marland Kitchen aspirin EC 81 MG tablet Take 81 mg by mouth daily.    . Calcium Acetate-Magnesium Carb (MAGNEBIND 200  PO) Take by mouth.    . calcium carbonate 1250 MG capsule Take 1,250 mg by mouth 2 (two) times daily with a meal.    . celecoxib (CELEBREX) 200 MG capsule Take 200 mg by mouth 2 (two) times daily.    . cholecalciferol (VITAMIN D) 1000 units tablet Take 1,000 Units by mouth daily.    Marland Kitchen co-enzyme Q-10 30 MG capsule Take 30 mg by mouth 3 (three) times daily.    Marland Kitchen estradiol (ESTRACE) 1 MG tablet Take 1 tablet (1 mg total) by mouth daily. 90 tablet 3  . fluticasone (FLONASE) 50 MCG/ACT nasal spray Place 1 spray into both nostrils as needed. (Patient not taking: Reported on 06/18/2020)    . gabapentin (NEURONTIN) 100 MG capsule Take 100 mg by mouth 3 (three) times daily.    . Omega-3 Fatty Acids (FISH OIL) 1000 MG CAPS Take by mouth.    Marland Kitchen omeprazole (PRILOSEC) 20 MG capsule Take 20 mg by mouth daily.    . RESTASIS 0.05 % ophthalmic emulsion     .  simvastatin (ZOCOR) 10 MG tablet Take 10 mg by mouth daily.    Marland Kitchen topiramate (TOPAMAX) 50 MG tablet Take 50 mg by mouth daily.     No current facility-administered medications for this visit.    OBJECTIVE: White woman in no acute distress  Vitals:   07/17/20 1419  BP: 128/66  Friedman: 81  Resp: 18  Temp: (!) 97.2 F (36.2 C)  SpO2: 100%     Body mass index is 20.21 kg/m.   Wt Readings from Last 3 Encounters:  07/17/20 110 lb 8 oz (50.1 kg)  06/18/20 106 lb 8 oz (48.3 kg)  05/29/20 104 lb 4.4 oz (47.3 kg)      ECOG FS:1 - Symptomatic but completely ambulatory  Sclerae unicteric, EOMs intact Wearing a mask No cervical or supraclavicular adenopathy Lungs no rales or rhonchi Heart regular rate and rhythm Abd soft, nontender, positive bowel sounds MSK no focal spinal tenderness, no upper extremity lymphedema Neuro: nonfocal, well oriented, appropriate affect Breasts: The right breast is status post lumpectomy, with an excellent cosmetic result.  The incision is healing nicely with no dehiscence or swelling.  There is minimal erythema from the  radiation.  Both axillae are benign.   LAB RESULTS:  CMP     Component Value Date/Time   NA 135 05/22/2020 0804   K 4.0 05/22/2020 0804   CL 103 05/22/2020 0804   CO2 25 05/22/2020 0804   GLUCOSE 88 05/22/2020 0804   BUN 16 05/22/2020 0804   CREATININE 1.03 (H) 05/22/2020 0804   CALCIUM 10.0 05/22/2020 0804   PROT 6.5 05/22/2020 0804   ALBUMIN 3.8 05/22/2020 0804   AST 17 05/22/2020 0804   ALT 11 05/22/2020 0804   ALKPHOS 57 05/22/2020 0804   BILITOT 0.3 05/22/2020 0804   GFRNONAA 52 (L) 05/22/2020 0804   GFRAA 60 (L) 05/22/2020 0804    No results found for: TOTALPROTELP, ALBUMINELP, A1GS, A2GS, BETS, BETA2SER, GAMS, MSPIKE, SPEI  Lab Results  Component Value Date   WBC 6.7 05/22/2020   NEUTROABS 4.6 05/22/2020   HGB 11.8 (L) 05/22/2020   HCT 35.7 (L) 05/22/2020   MCV 92.2 05/22/2020   PLT 223 05/22/2020    No results found for: LABCA2  No components found for: QMGQQP619  No results for input(s): INR in the last 168 hours.  No results found for: LABCA2  No results found for: JKD326  No results found for: ZTI458  No results found for: KDX833  No results found for: CA2729  No components found for: HGQUANT  No results found for: CEA1 / No results found for: CEA1   No results found for: AFPTUMOR  No results found for: CHROMOGRNA  No results found for: KPAFRELGTCHN, LAMBDASER, KAPLAMBRATIO (kappa/lambda light chains)  No results found for: HGBA, HGBA2QUANT, HGBFQUANT, HGBSQUAN (Hemoglobinopathy evaluation)   No results found for: LDH  No results found for: IRON, TIBC, IRONPCTSAT (Iron and TIBC)  No results found for: FERRITIN  Urinalysis No results found for: COLORURINE, APPEARANCEUR, LABSPEC, PHURINE, GLUCOSEU, HGBUR, BILIRUBINUR, KETONESUR, PROTEINUR, UROBILINOGEN, NITRITE, LEUKOCYTESUR   STUDIES: No results found.   ELIGIBLE FOR AVAILABLE RESEARCH PROTOCOL: AET  ASSESSMENT: 79 y.o. Elizabeth Friedman woman status post right breast biopsy  05/15/2020 for ductal carcinoma in situ, grade 3, strongly estrogen and progesterone receptor positive  (1) definitive surgery pending  (2) adjuvant radiation to follow  (3) consider antiestrogens  (a) the patient has been strongly advised to discontinue hormone replacement   PLAN: Elizabeth Friedman did very well with  her surgery and I gave her a copy of her pathology and reviewed it with her today.  She is also tolerating radiation well so far.  She would get very little benefit from antiestrogens.  Given the fact that she had a noninvasive tumor and that the risk of local recurrence is going to be in the 10% or less range after radiation I see very little reason for her to expose herself either to the risk of blood clots with tamoxifen or worsening osteopenia/osteoporosis with anastrozole  Accordingly systemically the plan will be for observation alone.  I have set her up for repeat mammography in April 2022 and she will see me the following month.  From that point we will see her on a yearly basis.  Total encounter time 25 minutes.Sarajane Jews C. Ravonda Brecheen, MD 07/17/2020 2:29 PM Medical Oncology and Hematology [facility] [place], [place] [address] Tel. [phone]    Fax. [phone]   This document serves as a record of services personally performed by Lurline Del, MD. It was created on his behalf by Wilburn Mylar, a trained medical scribe. The creation of this record is based on the scribe's personal observations and the provider's statements to them.   I, Lurline Del MD, have reviewed the above documentation for accuracy and completeness, and I agree with the above.   *Total Encounter Time as defined by the Centers for Medicare and Medicaid Services includes, in addition to the face-to-face time of a patient visit (documented in the note above) non-face-to-face time: obtaining and reviewing outside history, ordering and reviewing medications, tests or  procedures, care coordination (communications with other health care professionals or caregivers) and documentation in the medical record.

## 2020-07-18 ENCOUNTER — Ambulatory Visit
Admission: RE | Admit: 2020-07-18 | Discharge: 2020-07-18 | Disposition: A | Discharge status: Home / Self Care | Payer: PPO | Source: Ambulatory Visit | Attending: Radiation Oncology | Admitting: Radiation Oncology

## 2020-07-18 ENCOUNTER — Other Ambulatory Visit: Payer: Self-pay

## 2020-07-18 ENCOUNTER — Telehealth: Payer: Self-pay | Admitting: Oncology

## 2020-07-18 DIAGNOSIS — D0511 Intraductal carcinoma in situ of right breast: Secondary | ICD-10-CM | POA: Diagnosis not present

## 2020-07-18 DIAGNOSIS — Z510 Encounter for antineoplastic radiation therapy: Secondary | ICD-10-CM | POA: Diagnosis not present

## 2020-07-18 NOTE — Telephone Encounter (Signed)
Called and spoke with pt, confirmed 5/2 appt date and time

## 2020-07-19 ENCOUNTER — Ambulatory Visit
Admission: RE | Admit: 2020-07-19 | Discharge: 2020-07-19 | Disposition: A | Discharge status: Home / Self Care | Payer: PPO | Source: Ambulatory Visit | Attending: Radiation Oncology | Admitting: Radiation Oncology

## 2020-07-19 ENCOUNTER — Other Ambulatory Visit: Payer: Self-pay

## 2020-07-19 DIAGNOSIS — Z510 Encounter for antineoplastic radiation therapy: Secondary | ICD-10-CM | POA: Diagnosis not present

## 2020-07-19 DIAGNOSIS — D0511 Intraductal carcinoma in situ of right breast: Secondary | ICD-10-CM | POA: Diagnosis not present

## 2020-07-22 ENCOUNTER — Ambulatory Visit
Admission: RE | Admit: 2020-07-22 | Discharge: 2020-07-22 | Disposition: A | Discharge status: Home / Self Care | Payer: PPO | Source: Ambulatory Visit | Attending: Radiation Oncology | Admitting: Radiation Oncology

## 2020-07-22 ENCOUNTER — Ambulatory Visit: Payer: PPO | Admitting: Radiation Oncology

## 2020-07-22 DIAGNOSIS — Z510 Encounter for antineoplastic radiation therapy: Secondary | ICD-10-CM | POA: Diagnosis not present

## 2020-07-22 DIAGNOSIS — D0511 Intraductal carcinoma in situ of right breast: Secondary | ICD-10-CM | POA: Diagnosis not present

## 2020-07-23 ENCOUNTER — Ambulatory Visit: Payer: PPO | Admitting: Radiation Oncology

## 2020-07-23 ENCOUNTER — Other Ambulatory Visit: Payer: Self-pay

## 2020-07-23 ENCOUNTER — Ambulatory Visit
Admission: RE | Admit: 2020-07-23 | Discharge: 2020-07-23 | Disposition: A | Discharge status: Home / Self Care | Payer: PPO | Source: Ambulatory Visit | Attending: Radiation Oncology | Admitting: Radiation Oncology

## 2020-07-23 DIAGNOSIS — Z510 Encounter for antineoplastic radiation therapy: Secondary | ICD-10-CM | POA: Diagnosis not present

## 2020-07-23 DIAGNOSIS — D0511 Intraductal carcinoma in situ of right breast: Secondary | ICD-10-CM | POA: Diagnosis not present

## 2020-07-24 ENCOUNTER — Other Ambulatory Visit: Payer: Self-pay

## 2020-07-24 ENCOUNTER — Ambulatory Visit
Admission: RE | Admit: 2020-07-24 | Discharge: 2020-07-24 | Disposition: A | Discharge status: Home / Self Care | Payer: PPO | Source: Ambulatory Visit | Attending: Radiation Oncology | Admitting: Radiation Oncology

## 2020-07-24 DIAGNOSIS — Z510 Encounter for antineoplastic radiation therapy: Secondary | ICD-10-CM | POA: Diagnosis not present

## 2020-07-24 DIAGNOSIS — D0511 Intraductal carcinoma in situ of right breast: Secondary | ICD-10-CM | POA: Diagnosis not present

## 2020-07-25 ENCOUNTER — Other Ambulatory Visit: Payer: Self-pay

## 2020-07-25 ENCOUNTER — Ambulatory Visit
Admission: RE | Admit: 2020-07-25 | Discharge: 2020-07-25 | Disposition: A | Discharge status: Home / Self Care | Payer: PPO | Source: Ambulatory Visit | Attending: Radiation Oncology | Admitting: Radiation Oncology

## 2020-07-25 DIAGNOSIS — Z510 Encounter for antineoplastic radiation therapy: Secondary | ICD-10-CM | POA: Diagnosis not present

## 2020-07-26 ENCOUNTER — Other Ambulatory Visit: Payer: Self-pay

## 2020-07-26 ENCOUNTER — Ambulatory Visit
Admission: RE | Admit: 2020-07-26 | Discharge: 2020-07-26 | Disposition: A | Discharge status: Home / Self Care | Payer: PPO | Source: Ambulatory Visit | Attending: Radiation Oncology | Admitting: Radiation Oncology

## 2020-07-26 DIAGNOSIS — Z510 Encounter for antineoplastic radiation therapy: Secondary | ICD-10-CM | POA: Insufficient documentation

## 2020-07-26 DIAGNOSIS — D0511 Intraductal carcinoma in situ of right breast: Secondary | ICD-10-CM | POA: Diagnosis not present

## 2020-07-26 DIAGNOSIS — Z170 Estrogen receptor positive status [ER+]: Secondary | ICD-10-CM | POA: Insufficient documentation

## 2020-07-29 ENCOUNTER — Ambulatory Visit
Admission: RE | Admit: 2020-07-29 | Discharge: 2020-07-29 | Disposition: A | Discharge status: Home / Self Care | Payer: PPO | Source: Ambulatory Visit | Attending: Radiation Oncology | Admitting: Radiation Oncology

## 2020-07-29 ENCOUNTER — Encounter: Payer: Self-pay | Admitting: *Deleted

## 2020-07-29 DIAGNOSIS — Z510 Encounter for antineoplastic radiation therapy: Secondary | ICD-10-CM | POA: Diagnosis not present

## 2020-07-30 ENCOUNTER — Ambulatory Visit
Admission: RE | Admit: 2020-07-30 | Discharge: 2020-07-30 | Disposition: A | Discharge status: Home / Self Care | Payer: PPO | Source: Ambulatory Visit | Attending: Radiation Oncology | Admitting: Radiation Oncology

## 2020-07-30 ENCOUNTER — Encounter: Payer: Self-pay | Admitting: *Deleted

## 2020-07-30 ENCOUNTER — Encounter: Payer: Self-pay | Admitting: Radiation Oncology

## 2020-07-30 DIAGNOSIS — D0511 Intraductal carcinoma in situ of right breast: Secondary | ICD-10-CM | POA: Diagnosis not present

## 2020-07-30 DIAGNOSIS — Z510 Encounter for antineoplastic radiation therapy: Secondary | ICD-10-CM | POA: Diagnosis not present

## 2020-08-12 ENCOUNTER — Other Ambulatory Visit: Payer: Self-pay

## 2020-08-12 ENCOUNTER — Other Ambulatory Visit
Admission: RE | Admit: 2020-08-12 | Discharge: 2020-08-12 | Disposition: A | Discharge status: Home / Self Care | Payer: PPO | Source: Ambulatory Visit | Attending: Internal Medicine | Admitting: Internal Medicine

## 2020-08-12 DIAGNOSIS — Z01812 Encounter for preprocedural laboratory examination: Secondary | ICD-10-CM | POA: Diagnosis not present

## 2020-08-12 DIAGNOSIS — Z20822 Contact with and (suspected) exposure to covid-19: Secondary | ICD-10-CM | POA: Diagnosis not present

## 2020-08-13 ENCOUNTER — Encounter: Payer: Self-pay | Admitting: Internal Medicine

## 2020-08-13 LAB — SARS CORONAVIRUS 2 (TAT 6-24 HRS): SARS Coronavirus 2: NEGATIVE

## 2020-08-14 ENCOUNTER — Ambulatory Visit: Payer: PPO | Admitting: Anesthesiology

## 2020-08-14 ENCOUNTER — Other Ambulatory Visit: Payer: Self-pay

## 2020-08-14 ENCOUNTER — Encounter: Payer: Self-pay | Admitting: Internal Medicine

## 2020-08-14 ENCOUNTER — Encounter
Admission: RE | Disposition: A | Discharge status: Home / Self Care | Payer: Self-pay | Source: Home / Self Care | Attending: Internal Medicine

## 2020-08-14 ENCOUNTER — Ambulatory Visit
Admission: RE | Admit: 2020-08-14 | Discharge: 2020-08-14 | Disposition: A | Discharge status: Home / Self Care | Payer: PPO | Attending: Internal Medicine | Admitting: Internal Medicine

## 2020-08-14 DIAGNOSIS — R6881 Early satiety: Secondary | ICD-10-CM | POA: Insufficient documentation

## 2020-08-14 DIAGNOSIS — Z853 Personal history of malignant neoplasm of breast: Secondary | ICD-10-CM | POA: Diagnosis not present

## 2020-08-14 DIAGNOSIS — K2289 Other specified disease of esophagus: Secondary | ICD-10-CM | POA: Diagnosis not present

## 2020-08-14 DIAGNOSIS — K2970 Gastritis, unspecified, without bleeding: Secondary | ICD-10-CM | POA: Diagnosis not present

## 2020-08-14 DIAGNOSIS — K209 Esophagitis, unspecified without bleeding: Secondary | ICD-10-CM | POA: Diagnosis not present

## 2020-08-14 DIAGNOSIS — G43909 Migraine, unspecified, not intractable, without status migrainosus: Secondary | ICD-10-CM | POA: Insufficient documentation

## 2020-08-14 DIAGNOSIS — K259 Gastric ulcer, unspecified as acute or chronic, without hemorrhage or perforation: Secondary | ICD-10-CM | POA: Insufficient documentation

## 2020-08-14 DIAGNOSIS — K219 Gastro-esophageal reflux disease without esophagitis: Secondary | ICD-10-CM | POA: Diagnosis not present

## 2020-08-14 DIAGNOSIS — K222 Esophageal obstruction: Secondary | ICD-10-CM | POA: Insufficient documentation

## 2020-08-14 DIAGNOSIS — D500 Iron deficiency anemia secondary to blood loss (chronic): Secondary | ICD-10-CM | POA: Insufficient documentation

## 2020-08-14 DIAGNOSIS — E785 Hyperlipidemia, unspecified: Secondary | ICD-10-CM | POA: Diagnosis not present

## 2020-08-14 DIAGNOSIS — K449 Diaphragmatic hernia without obstruction or gangrene: Secondary | ICD-10-CM | POA: Diagnosis not present

## 2020-08-14 DIAGNOSIS — M1990 Unspecified osteoarthritis, unspecified site: Secondary | ICD-10-CM | POA: Diagnosis not present

## 2020-08-14 DIAGNOSIS — K5790 Diverticulosis of intestine, part unspecified, without perforation or abscess without bleeding: Secondary | ICD-10-CM | POA: Diagnosis not present

## 2020-08-14 DIAGNOSIS — K5730 Diverticulosis of large intestine without perforation or abscess without bleeding: Secondary | ICD-10-CM | POA: Insufficient documentation

## 2020-08-14 DIAGNOSIS — K642 Third degree hemorrhoids: Secondary | ICD-10-CM | POA: Insufficient documentation

## 2020-08-14 DIAGNOSIS — K2100 Gastro-esophageal reflux disease with esophagitis, without bleeding: Secondary | ICD-10-CM | POA: Diagnosis not present

## 2020-08-14 DIAGNOSIS — K31811 Angiodysplasia of stomach and duodenum with bleeding: Secondary | ICD-10-CM | POA: Diagnosis not present

## 2020-08-14 DIAGNOSIS — Q2733 Arteriovenous malformation of digestive system vessel: Secondary | ICD-10-CM | POA: Diagnosis not present

## 2020-08-14 DIAGNOSIS — Z791 Long term (current) use of non-steroidal anti-inflammatories (NSAID): Secondary | ICD-10-CM | POA: Insufficient documentation

## 2020-08-14 DIAGNOSIS — Z7982 Long term (current) use of aspirin: Secondary | ICD-10-CM | POA: Insufficient documentation

## 2020-08-14 DIAGNOSIS — K2900 Acute gastritis without bleeding: Secondary | ICD-10-CM | POA: Diagnosis not present

## 2020-08-14 DIAGNOSIS — D509 Iron deficiency anemia, unspecified: Secondary | ICD-10-CM | POA: Diagnosis not present

## 2020-08-14 DIAGNOSIS — I7300 Raynaud's syndrome without gangrene: Secondary | ICD-10-CM | POA: Diagnosis not present

## 2020-08-14 DIAGNOSIS — K31819 Angiodysplasia of stomach and duodenum without bleeding: Secondary | ICD-10-CM | POA: Diagnosis not present

## 2020-08-14 DIAGNOSIS — K648 Other hemorrhoids: Secondary | ICD-10-CM | POA: Diagnosis not present

## 2020-08-14 DIAGNOSIS — Z79899 Other long term (current) drug therapy: Secondary | ICD-10-CM | POA: Insufficient documentation

## 2020-08-14 DIAGNOSIS — K644 Residual hemorrhoidal skin tags: Secondary | ICD-10-CM | POA: Diagnosis not present

## 2020-08-14 HISTORY — PX: ESOPHAGOGASTRODUODENOSCOPY (EGD) WITH PROPOFOL: SHX5813

## 2020-08-14 HISTORY — DX: Angina pectoris, unspecified: I20.9

## 2020-08-14 HISTORY — PX: COLONOSCOPY WITH PROPOFOL: SHX5780

## 2020-08-14 SURGERY — COLONOSCOPY WITH PROPOFOL
Anesthesia: General

## 2020-08-14 MED ORDER — PROPOFOL 500 MG/50ML IV EMUL
INTRAVENOUS | Status: DC | PRN
  Administered 2020-08-14: 80 ug/kg/min via INTRAVENOUS

## 2020-08-14 MED ORDER — PROPOFOL 10 MG/ML IV BOLUS
INTRAVENOUS | Status: AC
  Filled 2020-08-14: qty 20

## 2020-08-14 MED ORDER — PROPOFOL 500 MG/50ML IV EMUL
INTRAVENOUS | Status: AC
  Filled 2020-08-14: qty 50

## 2020-08-14 MED ORDER — LIDOCAINE HCL (CARDIAC) PF 100 MG/5ML IV SOSY
PREFILLED_SYRINGE | INTRAVENOUS | Status: DC | PRN
  Administered 2020-08-14: 50 mg via INTRAVENOUS

## 2020-08-14 MED ORDER — SODIUM CHLORIDE 0.9 % IV SOLN: INTRAVENOUS | Status: DC

## 2020-08-14 MED ORDER — LIDOCAINE HCL (PF) 2 % IJ SOLN
INTRAMUSCULAR | Status: AC
  Filled 2020-08-14: qty 5

## 2020-08-14 NOTE — Interval H&P Note (Signed)
History and Physical Interval Note:  08/14/2020 9:46 AM  Elizabeth Friedman  has presented today for surgery, with the diagnosis of IDA.  The various methods of treatment have been discussed with the patient and family. After consideration of risks, benefits and other options for treatment, the patient has consented to  Procedure(s): COLONOSCOPY WITH PROPOFOL (N/A) ESOPHAGOGASTRODUODENOSCOPY (EGD) WITH PROPOFOL (N/A) as a surgical intervention.  The patient's history has been reviewed, patient examined, no change in status, stable for surgery.  I have reviewed the patient's chart and labs.  Questions were answered to the patient's satisfaction.     [place], [place]

## 2020-08-14 NOTE — Op Note (Signed)
[facility] Patient Name: Elizabeth Friedman Procedure Date: 08/14/2020 9:41 AM MRN: [id number] Account #: [id number] Date of Birth: 1941-09-07 Admit Type: Outpatient Age: 79 Room: [facility] ENDO ROOM 3 Gender: Female Note Status: Finalized Procedure:             Colonoscopy Indications:           Unexplained iron deficiency anemia Providers:             Benay Pike. Greyson Riccardi MD, MD Medicines:             Propofol per Anesthesia Complications:         No immediate complications. Procedure:             Pre-Anesthesia Assessment:                        - The risks and benefits of the procedure and the                         sedation options and risks were discussed with the                         patient. All questions were answered and informed                         consent was obtained.                        - Patient identification and proposed procedure were                         verified prior to the procedure by the nurse. The                         procedure was verified in the procedure room.                        - ASA Grade Assessment: III - A patient with severe                         systemic disease.                        - After reviewing the risks and benefits, the patient                         was deemed in satisfactory condition to undergo the                         procedure.                        After obtaining informed consent, the colonoscope was                         passed under direct vision. Throughout the procedure,                         the patient's blood pressure, pulse, and oxygen  saturations were monitored continuously. The                         Colonoscope was introduced through the anus and                         advanced to the the cecum, identified by appendiceal                         orifice and ileocecal valve. The colonoscopy was                         performed without  difficulty. The patient tolerated                         the procedure well. The quality of the bowel                         preparation was good. The ileocecal valve, appendiceal                         orifice, and rectum were photographed. Findings:      The perianal exam findings include internal hemorrhoids that prolapse       with straining, but require manual replacement into the anal canal       (Grade III).      The digital rectal exam findings include decreased sphincter tone.      A few small-mouthed diverticula were found in the sigmoid colon.      Non-bleeding internal hemorrhoids were found during retroflexion. The       hemorrhoids were Grade III (internal hemorrhoids that prolapse but       require manual reduction).      The exam was otherwise without abnormality. Impression:            - Internal hemorrhoids that prolapse with straining,                         but require manual replacement into the anal canal                         (Grade III) found on perianal exam.                        - Decreased sphincter tone found on digital rectal                         exam.                        - Diverticulosis in the sigmoid colon.                        - Non-bleeding internal hemorrhoids.                        - The examination was otherwise normal.                        - No specimens collected. Recommendation:        - Await pathology results from  EGD, also performed                         today.                        - Patient has a contact number available for                         emergencies. The signs and symptoms of potential                         delayed complications were discussed with the patient.                         Return to normal activities tomorrow. Written                         discharge instructions were provided to the patient.                        - Resume previous diet.                        - Continue present medications.                         - No repeat colonoscopy due to current age (40 years                         or older) and the absence of colonic polyps.                        - To visualize the small bowel, perform video capsule                         endoscopy at appointment to be scheduled.                        - Return to GI office in 8 weeks.                        - You do NOT require further colon cancer screening                         measures (Annual stool testing (i.e. hemoccult, FIT,                         cologuard), sigmoidoscopy, colonoscopy or CT                         colonography). You should share this recommendation                         with your Primary Care provider.                        - The findings and recommendations were discussed with  the patient. Procedure Code(s):     --- Professional ---                        [phone], Colonoscopy, flexible; diagnostic, including                         collection of specimen(s) by brushing or washing, when                         performed (separate procedure) Diagnosis Code(s):     --- Professional ---                        K57.30, Diverticulosis of large intestine without                         perforation or abscess without bleeding                        D50.9, Iron deficiency anemia, unspecified                        K64.2, Third degree hemorrhoids                        K62.89, Other specified diseases of anus and rectum CPT copyright 2019 American Medical Association. All rights reserved. The codes documented in this report are preliminary and upon coder review may  be revised to meet current compliance requirements. Efrain Sella MD, MD 08/14/2020 10:26:46 AM This report has been signed electronically. Number of Addenda: 0 Note Initiated On: 08/14/2020 9:41 AM Scope Withdrawal Time: 0 hours 6 minutes 9 seconds  Total Procedure Duration: 0 hours 11 minutes 19 seconds  Estimated  Blood Loss:  Estimated blood loss: none.      [facility]

## 2020-08-14 NOTE — Anesthesia Preprocedure Evaluation (Signed)
Anesthesia Evaluation  Patient identified by MRN, date of birth, ID band Patient awake    Reviewed: Allergy & Precautions, NPO status , Patient's Chart, lab work & pertinent test results  History of Anesthesia Complications Negative for: history of anesthetic complications  Airway Mallampati: II  TM Distance: >3 FB Neck ROM: Full    Dental no notable dental hx.    Pulmonary neg pulmonary ROS, neg sleep apnea, neg COPD,    breath sounds clear to auscultation- rhonchi (-) wheezing      Cardiovascular (-) hypertension(-) CAD, (-) Past MI, (-) Cardiac Stents and (-) CABG  Rhythm:Regular Rate:Normal - Systolic murmurs and - Diastolic murmurs    Neuro/Psych  Headaches, neg Seizures negative psych ROS   GI/Hepatic Neg liver ROS, GERD  ,  Endo/Other  negative endocrine ROSneg diabetes  Renal/GU negative Renal ROS     Musculoskeletal  (+) Arthritis ,   Abdominal (+) - obese,   Peds  Hematology negative hematology ROS (+)   Anesthesia Other Findings Past Medical History: No date: Allergic rhinitis No date: Anginal pain (HCC) No date: Arthritis No date: Atypical chest pain No date: Breast cancer ([place]) No date: Cancer ([place])     Comment:  skin No date: Fibrocystic breast disease No date: GERD (gastroesophageal reflux disease) No date: Hyperlipidemia No date: Lung nodule No date: Menopause No date: Migraine No date: Migraine     Comment:  optical migraines No date: Plantar fasciitis No date: Prolapse of female pelvic organs     Comment:  hodge pessary No date: Raynaud's disease No date: Sciatic leg pain No date: Sciatica No date: Urinary retention with incomplete bladder emptying No date: Vitreous detachment of both eyes   Reproductive/Obstetrics                             Anesthesia Physical Anesthesia Plan  ASA: II  Anesthesia Plan: General   Post-op Pain Management:     Induction: Intravenous  PONV Risk Score and Plan: 2 and Propofol infusion  Airway Management Planned: Natural Airway  Additional Equipment:   Intra-op Plan:   Post-operative Plan:   Informed Consent: I have reviewed the patients History and Physical, chart, labs and discussed the procedure including the risks, benefits and alternatives for the proposed anesthesia with the patient or authorized representative who has indicated his/her understanding and acceptance.     Dental advisory given  Plan Discussed with: CRNA and Anesthesiologist  Anesthesia Plan Comments:         Anesthesia Quick Evaluation

## 2020-08-14 NOTE — Transfer of Care (Signed)
Immediate Anesthesia Transfer of Care Note  Patient: Ranee Peasley  Procedure(s) Performed: COLONOSCOPY WITH PROPOFOL (N/A ) ESOPHAGOGASTRODUODENOSCOPY (EGD) WITH PROPOFOL (N/A )  Patient Location: PACU and Endoscopy Unit  Anesthesia Type:General  Level of Consciousness: sedated  Airway & Oxygen Therapy: Patient Spontanous Breathing and Patient connected to nasal cannula oxygen  Post-op Assessment: Report given to RN and Post -op Vital signs reviewed and stable  Post vital signs: Reviewed and stable  Last Vitals:  Vitals Value Taken Time  BP 121/66 08/14/20 1024  Temp    Pulse 69 08/14/20 1025  Resp 12 08/14/20 1025  SpO2 97 % 08/14/20 1025  Vitals shown include unvalidated device data.  Last Pain:  Vitals:   08/14/20 0903  TempSrc: Temporal  PainSc: 0-No pain         Complications: No complications documented.

## 2020-08-14 NOTE — Anesthesia Postprocedure Evaluation (Signed)
Anesthesia Post Note  Patient: [name]  Procedure(s) Performed: COLONOSCOPY WITH PROPOFOL (N/A ) ESOPHAGOGASTRODUODENOSCOPY (EGD) WITH PROPOFOL (N/A )  Patient location during evaluation: Endoscopy Anesthesia Type: General Level of consciousness: awake and alert and oriented Pain management: pain level controlled Vital Signs Assessment: post-procedure vital signs reviewed and stable Respiratory status: spontaneous breathing, nonlabored ventilation and respiratory function stable Cardiovascular status: blood pressure returned to baseline and stable Postop Assessment: no signs of nausea or vomiting Anesthetic complications: no   No complications documented.   Last Vitals:  Vitals:   08/14/20 1024 08/14/20 1044  BP: 121/66 133/72  Pulse:    Resp:    Temp: (!) 36.2 C   SpO2:      Last Pain:  Vitals:   08/14/20 1054  TempSrc:   PainSc: 0-No pain                 Elizabeth Friedman

## 2020-08-14 NOTE — Op Note (Signed)
[facility] Patient Name: Elizabeth Friedman Procedure Date: 08/14/2020 9:42 AM MRN: [id number] Account #: [id number] Date of Birth: 10-15-1941 Admit Type: Outpatient Age: 78 Room: [facility] ENDO ROOM 3 Gender: Female Note Status: Finalized Procedure:             Upper GI endoscopy Indications:           Iron deficiency anemia secondary to chronic blood                         loss, Gastro-esophageal reflux disease, Failure to                         respond to medical treatment, Early satiety Providers:             Benay Pike. Lorilee Cafarella MD, MD Medicines:             Propofol per Anesthesia Complications:         No immediate complications. Procedure:             Pre-Anesthesia Assessment:                        - The risks and benefits of the procedure and the                         sedation options and risks were discussed with the                         patient. All questions were answered and informed                         consent was obtained.                        - Patient identification and proposed procedure were                         verified prior to the procedure by the nurse. The                         procedure was verified in the procedure room.                        - ASA Grade Assessment: III - A patient with severe                         systemic disease.                        - After reviewing the risks and benefits, the patient                         was deemed in satisfactory condition to undergo the                         procedure.                        After obtaining informed consent, the endoscope was  passed under direct vision. Throughout the procedure,                         the patient's blood pressure, pulse, and oxygen                         saturations were monitored continuously. The Endoscope                         was introduced through the mouth, and advanced to the                          third part of duodenum. The upper GI endoscopy was                         accomplished without difficulty. The patient tolerated                         the procedure well. Findings:      One benign-appearing, intrinsic mild stenosis was found at the       gastroesophageal junction. This stenosis measured 1.6 cm (inner       diameter) x less than one cm (in length). The stenosis was traversed.      Diffuse mild mucosal variance characterized by erythema was found in the       entire esophagus. Biopsies were obtained from the proximal and distal       esophagus with cold forceps for histology of suspected eosinophilic       esophagitis. Estimated blood loss: none.      A 1 cm hiatal hernia was present.      Two small angioectasias with bleeding were found in the gastric body.       Coagulation for hemostasis using argon plasma at 0.8 liters/minute and       30 watts was successful. Estimated blood loss was minimal.      Patchy mild inflammation characterized by erosions and erythema was       found in the gastric antrum.      The examined duodenum was normal.      The exam was otherwise without abnormality. Impression:            - Benign-appearing esophageal stenosis.                        - Esophageal mucosal variant. Biopsied.                        - 1 cm hiatal hernia.                        - Two bleeding angioectasias in the stomach. Treated                         with argon plasma coagulation (APC).                        - Gastritis.                        - Normal examined duodenum.                        -  The examination was otherwise normal. Recommendation:        - Await pathology results.                        - Check hemoglobin monthly.                        - Proceed with colonoscopy Procedure Code(s):     --- Professional ---                        40981, 82, Esophagogastroduodenoscopy, flexible,                         transoral; with control of bleeding,  any method                        43239, Esophagogastroduodenoscopy, flexible,                         transoral; with biopsy, single or multiple Diagnosis Code(s):     --- Professional ---                        R68.81, Early satiety                        K21.9, Gastro-esophageal reflux disease without                         esophagitis                        D50.0, Iron deficiency anemia secondary to blood loss                         (chronic)                        K29.70, Gastritis, unspecified, without bleeding                        K31.811, Angiodysplasia of stomach and duodenum with                         bleeding                        K44.9, Diaphragmatic hernia without obstruction or                         gangrene                        K22.8, Other specified diseases of esophagus                        K22.2, Esophageal obstruction CPT copyright 2019 American Medical Association. All rights reserved. The codes documented in this report are preliminary and upon coder review may  be revised to meet current compliance requirements. Efrain Sella MD, MD 08/14/2020 10:07:05 AM This report has been signed electronically. Number of Addenda: 0 Note Initiated On: 08/14/2020 9:42 AM Estimated Blood Loss:  Estimated blood loss was minimal.      [place]  [facility]

## 2020-08-14 NOTE — H&P (Signed)
Outpatient short stay form Pre-procedure 08/14/2020 9:43 AM  Cina K. [name], M.D.  Primary Physician: Ramonita Lab, M.D.  Reason for visit:  GERD refractory to medical therapy, early satiety, iron deficiency anemia unexplained.  History of present illness:  Patient has GERD with little to no response to OTC PPI therapy. Has early satiety. No dysphagia. iron deficiency anemia. No change in bowel habits.    Current Facility-Administered Medications:  .  0.9 %  sodium chloride infusion, , Intravenous, Continuous, Roselind Klus, Benay Pike, MD  Medications Prior to Admission  Medication Sig Dispense Refill Last Dose  . acetaminophen (TYLENOL) 500 MG tablet Take 500 mg by mouth every 6 (six) hours as needed.   08/13/2020 at Unknown time  . aspirin EC 81 MG tablet Take 81 mg by mouth daily.   08/13/2020 at Unknown time  . Calcium Acetate-Magnesium Carb (MAGNEBIND 200 PO) Take by mouth.   08/13/2020 at Unknown time  . calcium carbonate 1250 MG capsule Take 1,250 mg by mouth 2 (two) times daily with a meal.   08/13/2020 at Unknown time  . celecoxib (CELEBREX) 200 MG capsule Take 200 mg by mouth 2 (two) times daily.   08/13/2020 at 0800  . cholecalciferol (VITAMIN D) 1000 units tablet Take 1,000 Units by mouth daily.   08/13/2020 at Unknown time  . co-enzyme Q-10 30 MG capsule Take 30 mg by mouth 3 (three) times daily.   08/13/2020 at Unknown time  . fluticasone (FLONASE) 50 MCG/ACT nasal spray Place 1 spray into both nostrils as needed.    Past Week at Unknown time  . gabapentin (NEURONTIN) 100 MG capsule Take 100 mg by mouth 3 (three) times daily.   08/13/2020 at Unknown time  . Omega-3 Fatty Acids (FISH OIL) 1000 MG CAPS Take by mouth.   08/13/2020 at Unknown time  . omeprazole (PRILOSEC) 20 MG capsule Take 20 mg by mouth daily.   08/13/2020 at Unknown time  . RESTASIS 0.05 % ophthalmic emulsion    08/13/2020 at Unknown time  . simvastatin (ZOCOR) 10 MG tablet Take 10 mg by mouth daily.   08/13/2020  at Unknown time  . topiramate (TOPAMAX) 50 MG tablet Take 50 mg by mouth daily.   08/13/2020 at Unknown time     No Known Allergies   Past Medical History:  Diagnosis Date  . Allergic rhinitis   . Anginal pain ([place])   . Arthritis   . Atypical chest pain   . Breast cancer ([place])   . Cancer ([place])    skin  . Fibrocystic breast disease   . GERD (gastroesophageal reflux disease)   . Hyperlipidemia   . Lung nodule   . Menopause   . Migraine   . Migraine    optical migraines  . Plantar fasciitis   . 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 Raynaud's disease   . Sciatic leg pain   . Sciatica   . Urinary retention with incomplete bladder emptying   . Vitreous detachment of both eyes     Review of systems:  Otherwise negative.    Physical Exam  Gen: Alert, oriented. Appears stated age.  HEENT: [place]/AT. PERRLA. Lungs: CTA, no wheezes. CV: RR nl S1, S2. Abd: soft, benign, no masses. BS+ Ext: No edema. Pulses 2+    Planned procedures: Proceed with EGD and colonoscopy. The patient understands the nature of the planned procedure, indications, risks, alternatives and potential complications including but not limited to bleeding, infection, perforation, damage  to internal organs and possible oversedation/side effects from anesthesia. The patient agrees and gives consent to proceed.  Please refer to procedure notes for findings, recommendations and patient disposition/instructions.     Whitni Pasquini K. [name], M.D. Gastroenterology 08/14/2020  9:43 AM

## 2020-08-15 LAB — SURGICAL PATHOLOGY

## 2020-08-16 ENCOUNTER — Encounter: Payer: Self-pay | Admitting: Internal Medicine

## 2020-08-22 DIAGNOSIS — K219 Gastro-esophageal reflux disease without esophagitis: Secondary | ICD-10-CM | POA: Diagnosis not present

## 2020-08-22 DIAGNOSIS — K222 Esophageal obstruction: Secondary | ICD-10-CM | POA: Diagnosis not present

## 2020-08-22 DIAGNOSIS — Z800 Family history of malignant neoplasm of digestive organs: Secondary | ICD-10-CM | POA: Diagnosis not present

## 2020-08-22 DIAGNOSIS — D500 Iron deficiency anemia secondary to blood loss (chronic): Secondary | ICD-10-CM | POA: Diagnosis not present

## 2020-08-22 DIAGNOSIS — Z853 Personal history of malignant neoplasm of breast: Secondary | ICD-10-CM | POA: Diagnosis not present

## 2020-08-22 DIAGNOSIS — K31811 Angiodysplasia of stomach and duodenum with bleeding: Secondary | ICD-10-CM | POA: Diagnosis not present

## 2020-08-26 ENCOUNTER — Encounter: Payer: Self-pay | Admitting: Internal Medicine

## 2020-08-28 ENCOUNTER — Telehealth: Payer: Self-pay

## 2020-08-28 NOTE — Telephone Encounter (Signed)
I called the patient today about her upcoming follow-up appointment in radiation oncology.   Given the state of the COVID-19 pandemic, concerning case numbers in our community, and guidance from [facility], I offered a phone assessment with the patient to determine if coming to the clinic was necessary. She accepted.  I let the patient know that I had spoken with Dr. Isidore Moos, and she wanted them to know the importance of washing their hands for at least 20 seconds at a time, especially after going out in public, and before they eat.  Limit going out in public whenever possible. Do not touch your face, unless your hands are clean, such as when bathing. Get plenty of rest, eat well, and stay hydrated. Patient verbalized understanding and agreement.  The patient denies any symptomatic concerns. She reports she has her energy back, and she feels "like my old self again". She reports she recently had an upper endoscopy as well as a colonoscopy, which took her about a week to recuperate from, but now she feels great. She denies any tenderness or swelling to the breast. Specifically, she reports good healing of her skin in the radiation fields. She reports her skin is intact and "looks great". I recommended that she continue skin care by applying oil or lotion with vitamin E to the skin in the radiation fields, BID, for 2 more months.    Continue follow-up with medical oncology - follow-up is to be scheduled for middle of next year. Per Dr. Sarajane Jews Magrinat's last note, she will have her yearly mammogram in April of 2022, and then follow-up with him that May. I explained that yearly mammograms are important for patients with intact breast tissue, and physical exams are important after mastectomy for patients that cannot undergo mammography. Patient verbalized understanding and agreement.   I encouraged her to call if she had further questions or concerns about her healing. Otherwise, she will follow-up  PRN in radiation oncology. Patient is pleased with this plan and expressed her thanks/gratitidue for the care she received from Dr. Isidore Moos and her team. We will cancel her upcoming follow-up to reduce the risk of COVID-19 transmission.

## 2020-08-30 ENCOUNTER — Ambulatory Visit: Payer: PPO | Admitting: Radiation Oncology

## 2020-09-04 DIAGNOSIS — E785 Hyperlipidemia, unspecified: Secondary | ICD-10-CM | POA: Diagnosis not present

## 2020-09-04 DIAGNOSIS — D509 Iron deficiency anemia, unspecified: Secondary | ICD-10-CM | POA: Diagnosis not present

## 2020-09-10 DIAGNOSIS — Z86000 Personal history of in-situ neoplasm of breast: Secondary | ICD-10-CM | POA: Insufficient documentation

## 2020-09-10 DIAGNOSIS — D509 Iron deficiency anemia, unspecified: Secondary | ICD-10-CM | POA: Insufficient documentation

## 2020-09-11 DIAGNOSIS — K219 Gastro-esophageal reflux disease without esophagitis: Secondary | ICD-10-CM | POA: Diagnosis not present

## 2020-09-11 DIAGNOSIS — E785 Hyperlipidemia, unspecified: Secondary | ICD-10-CM | POA: Diagnosis not present

## 2020-09-11 DIAGNOSIS — J8410 Pulmonary fibrosis, unspecified: Secondary | ICD-10-CM | POA: Diagnosis not present

## 2020-09-11 DIAGNOSIS — M5430 Sciatica, unspecified side: Secondary | ICD-10-CM | POA: Diagnosis not present

## 2020-09-11 DIAGNOSIS — I6523 Occlusion and stenosis of bilateral carotid arteries: Secondary | ICD-10-CM | POA: Diagnosis not present

## 2020-09-11 DIAGNOSIS — Z86000 Personal history of in-situ neoplasm of breast: Secondary | ICD-10-CM | POA: Diagnosis not present

## 2020-09-11 DIAGNOSIS — Z0000 Encounter for general adult medical examination without abnormal findings: Secondary | ICD-10-CM | POA: Diagnosis not present

## 2020-09-11 DIAGNOSIS — D500 Iron deficiency anemia secondary to blood loss (chronic): Secondary | ICD-10-CM | POA: Diagnosis not present

## 2020-09-18 NOTE — Progress Notes (Signed)
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   Plan: The patient will follow-up with radiation oncology in 1 mo, or PRN . -----------------------------------  Eppie Gibson, MD

## 2020-10-21 DIAGNOSIS — J069 Acute upper respiratory infection, unspecified: Secondary | ICD-10-CM | POA: Diagnosis not present

## 2020-11-01 DIAGNOSIS — D225 Melanocytic nevi of trunk: Secondary | ICD-10-CM | POA: Diagnosis not present

## 2020-11-01 DIAGNOSIS — Z85828 Personal history of other malignant neoplasm of skin: Secondary | ICD-10-CM | POA: Diagnosis not present

## 2020-11-01 DIAGNOSIS — D2262 Melanocytic nevi of left upper limb, including shoulder: Secondary | ICD-10-CM | POA: Diagnosis not present

## 2020-11-01 DIAGNOSIS — D2271 Melanocytic nevi of right lower limb, including hip: Secondary | ICD-10-CM | POA: Diagnosis not present

## 2020-11-01 DIAGNOSIS — D2261 Melanocytic nevi of right upper limb, including shoulder: Secondary | ICD-10-CM | POA: Diagnosis not present

## 2020-11-25 DIAGNOSIS — J329 Chronic sinusitis, unspecified: Secondary | ICD-10-CM | POA: Diagnosis not present

## 2020-12-31 ENCOUNTER — Encounter: Payer: Self-pay | Admitting: Obstetrics and Gynecology

## 2020-12-31 ENCOUNTER — Ambulatory Visit (INDEPENDENT_AMBULATORY_CARE_PROVIDER_SITE_OTHER): Payer: PPO | Admitting: Obstetrics and Gynecology

## 2020-12-31 ENCOUNTER — Other Ambulatory Visit: Payer: Self-pay

## 2020-12-31 VITALS — BP 158/78 | HR 80 | Ht 62.0 in | Wt 110.4 lb

## 2020-12-31 DIAGNOSIS — Z01419 Encounter for gynecological examination (general) (routine) without abnormal findings: Secondary | ICD-10-CM

## 2020-12-31 DIAGNOSIS — N816 Rectocele: Secondary | ICD-10-CM

## 2020-12-31 DIAGNOSIS — Z780 Asymptomatic menopausal state: Secondary | ICD-10-CM

## 2020-12-31 DIAGNOSIS — D0511 Intraductal carcinoma in situ of right breast: Secondary | ICD-10-CM

## 2020-12-31 DIAGNOSIS — R030 Elevated blood-pressure reading, without diagnosis of hypertension: Secondary | ICD-10-CM

## 2020-12-31 DIAGNOSIS — Z960 Presence of urogenital implants: Secondary | ICD-10-CM

## 2020-12-31 DIAGNOSIS — N8110 Cystocele, unspecified: Secondary | ICD-10-CM | POA: Diagnosis not present

## 2020-12-31 DIAGNOSIS — H2513 Age-related nuclear cataract, bilateral: Secondary | ICD-10-CM | POA: Diagnosis not present

## 2020-12-31 DIAGNOSIS — O169 Unspecified maternal hypertension, unspecified trimester: Secondary | ICD-10-CM

## 2020-12-31 NOTE — Progress Notes (Signed)
ANNUAL PREVENTATIVE CARE GYNECOLOGY  ENCOUNTER NOTE  Subjective:       Elizabeth Friedman is a 80 y.o. [phone] menopausal female here for a routine annual gynecologic exam. The patient is not sexually active. The patient is not currently taking hormone replacement therapy. Patient denies post-menopausal vaginal bleeding. The patient wears seatbelts: yes. The patient participates in regular exercise: no. Has the patient ever been transfused or tattooed?: no. The patient reports that there is not domestic violence in her life.  Current complaints: 1.  Diagnosed with breast cancer in August 2021.  Sees Oncologist and has mammograms q 6 months.  2. Has pessary in place. Has not had pessary changed in many years ("permanent"). . Has noticed over the last several days a foul odor occasionally, yellow-green in color. No itching or burning.  Now causing her to wear a light pad.  3. Reports h/o a fall from a chair in January after attempting to put up Christmas decorations. Since that time, she has noted a "soreness" or pulling sensation under the right breast.   Gynecologic History No LMP recorded. Patient is postmenopausal. Contraception: post menopausal status Last Pap: No further paps needed. Denies h/o abnormal pap smears Last mammogram: 05/09/2020.  Last Colonoscopy: 03/27/2016. Results were: Internal hemorrhoids. Repeat in 5 years. Last Dexa Scan: 2015. Osteopenia, with T score of -1.8.  PCP: Rosezella Rumpf at [facility].    Obstetric History OB History  Gravida Para Term Preterm AB Living  2 2 2     2   SAB IAB Ectopic Multiple Live Births          2    # Outcome Date GA Lbr Len/2nd Weight Sex Delivery Anes PTL Lv  2 Term 1964    F Vag-Spont   LIV  1 Term 1962    F Vag-Spont   LIV    Past Medical History:  Diagnosis Date   Allergic rhinitis    Anginal pain (HCC)    Arthritis    Atypical chest pain    Breast cancer (HCC)    Cancer (HCC)    skin   Fibrocystic breast  disease    GERD (gastroesophageal reflux disease)    Hyperlipidemia    Lung nodule    Menopause    Migraine    Migraine    optical migraines   Plantar fasciitis    Prolapse of female pelvic organs    hodge pessary   Raynaud's disease    Sciatic leg pain    Sciatica    Urinary retention with incomplete bladder emptying    Vitreous detachment of both eyes     Family History  Problem Relation Age of Onset   Breast cancer Mother    Heart disease Father    Colon cancer Brother    Diabetes Neg Hx    Ovarian cancer Neg Hx     Past Surgical History:  Procedure Laterality Date   APPENDECTOMY     BREAST LUMPECTOMY WITH RADIOACTIVE SEED LOCALIZATION Right 05/29/2020   Procedure: RIGHT BREAST LUMPECTOMY WITH RADIOACTIVE SEED LOCALIZATION;  Surgeon: Rolm Bookbinder, MD;  Location: [facility];  Service: General;  Laterality: Right;   CARDIAC CATHETERIZATION     COLONOSCOPY WITH PROPOFOL N/A 03/27/2016   Procedure: COLONOSCOPY WITH PROPOFOL;  Surgeon: Manya Silvas, MD;  Location: [facility] ENDOSCOPY;  Service: Endoscopy;  Laterality: N/A;   COLONOSCOPY WITH PROPOFOL N/A 08/14/2020   Procedure: COLONOSCOPY WITH PROPOFOL;  Surgeon: [name], Benay Pike, MD;  Location: [facility] ENDOSCOPY;  Service: Gastroenterology;  Laterality: N/A;   ESOPHAGOGASTRODUODENOSCOPY (EGD) WITH PROPOFOL N/A 08/14/2020   Procedure: ESOPHAGOGASTRODUODENOSCOPY (EGD) WITH PROPOFOL;  Surgeon: [place], Benay Pike, MD;  Location: [facility] ENDOSCOPY;  Service: Gastroenterology;  Laterality: N/A;   SKIN CANCER EXCISION     face and neck    Social History   Socioeconomic History   Marital status: Married    Spouse name: Not on file   Number of children: Not on file   Years of education: Not on file   Highest education level: Not on file  Occupational History   Not on file  Tobacco Use   Smoking status: Never Smoker   Smokeless tobacco: Never Used  Vaping Use   Vaping Use:  Never used  Substance and Sexual Activity   Alcohol use: No   Drug use: No   Sexual activity: Not Currently    Birth control/protection: Post-menopausal  Other Topics Concern   Not on file  Social History Narrative   Not on file   Social Determinants of Health   Financial Resource Strain: Not on file  Food Insecurity: Not on file  Transportation Needs: Not on file  Physical Activity: Not on file  Stress: Not on file  Social Connections: Not on file  Intimate Partner Violence: Not on file    Current Outpatient Medications on File Prior to Visit  Medication Sig Dispense Refill   acetaminophen (TYLENOL) 500 MG tablet Take 500 mg by mouth every 6 (six) hours as needed.     aspirin EC 81 MG tablet Take 81 mg by mouth daily.     Calcium Acetate-Magnesium Carb (MAGNEBIND 200 PO) Take by mouth.     calcium carbonate 1250 MG capsule Take 1,250 mg by mouth 2 (two) times daily with a meal.     celecoxib (CELEBREX) 200 MG capsule Take 200 mg by mouth 2 (two) times daily.     cholecalciferol (VITAMIN D) 1000 units tablet Take 1,000 Units by mouth daily.     co-enzyme Q-10 30 MG capsule Take 30 mg by mouth 3 (three) times daily.     gabapentin (NEURONTIN) 100 MG capsule Take 100 mg by mouth 3 (three) times daily.     Omega-3 Fatty Acids (FISH OIL) 1000 MG CAPS Take by mouth.     omeprazole (PRILOSEC) 20 MG capsule Take 20 mg by mouth daily.     RESTASIS 0.05 % ophthalmic emulsion      simvastatin (ZOCOR) 10 MG tablet Take 10 mg by mouth daily.     topiramate (TOPAMAX) 50 MG tablet Take 50 mg by mouth daily.     triamcinolone (NASACORT ALLERGY 24HR) 55 MCG/ACT AERO nasal inhaler Place 2 sprays into the nose daily.     No current facility-administered medications on file prior to visit.    No Known Allergies    Review of Systems ROS Review of Systems - General ROS: negative for - chills, fatigue, fever, hot flashes, night sweats, weight gain or weight  loss Psychological ROS: negative for - anxiety, decreased libido, depression, mood swings, physical abuse or sexual abuse Ophthalmic ROS: negative for - blurry vision, eye pain or loss of vision Allergy and Immunology ROS: negative for - hives, itchy/watery eyes or seasonal allergies Hematological and Lymphatic ROS: negative for - bleeding problems, bruising, swollen lymph nodes or weight loss Endocrine ROS: negative for - galactorrhea, hair pattern changes, hot flashes, malaise/lethargy, mood swings, palpitations, polydipsia/polyuria, skin changes, temperature intolerance or unexpected weight changes Breast  ROS: negative for - new or changing breast lumps or nipple discharge Respiratory ROS: negative for - cough or shortness of breath Cardiovascular ROS: negative for - chest pain, irregular heartbeat, palpitations or shortness of breath Gastrointestinal ROS: no abdominal pain, change in bowel habits, or black or bloody stools Genito-Urinary ROS: no dysuria, trouble voiding, or hematuria. Positive for vaginal discharge.  Musculoskeletal ROS: negative for - joint pain or joint stiffness Neurological ROS: negative for - bowel and bladder control changes Dermatological ROS: negative for rash and skin lesion changes   Objective:   BP (!) 158/78    Pulse 80    Ht 5\' 2"  (1.575 m)    Wt 110 lb 6.4 oz (50.1 kg)    BMI 20.19 kg/m  CONSTITUTIONAL: Well-developed, well-nourished female in no acute distress.  PSYCHIATRIC: Normal mood and affect. Normal behavior. Normal judgment and thought content. [place]: Alert and oriented to person, place, and time. Normal muscle tone coordination. No cranial nerve deficit noted. HENT:  Normocephalic, atraumatic, External right and left ear normal. Oropharynx is clear and moist EYES: Conjunctivae and EOM are normal. Pupils are equal, round, and reactive to light. No scleral icterus.  NECK: Normal range of motion, supple, no masses.  Normal thyroid.  SKIN: Skin is  warm and dry. No rash noted. Not diaphoretic. No erythema. No pallor. CARDIOVASCULAR: Normal heart rate noted, regular rhythm, no murmur. RESPIRATORY: Clear to auscultation bilaterally. Effort and breath sounds normal, no problems with respiration noted. BREASTS: Symmetric in size. No skin changes, nipple drainage, or lymphadenopathy.  Small palpable nodule noted ~ 3-4 mm above previous lumpectomy scar, mobile.  Tender to palpation.  ABDOMEN: Soft, normal bowel sounds, no distention noted.  No tenderness, rebound or guarding.  BLADDER: Normal PELVIC:  Bladder no bladder distension noted  Urethra: normal appearing urethra with no masses, tenderness or lesions  Vulva: normal appearing vulva with no masses, tenderness or lesions  Vagina: normal appearing vagina with minimal grey discharge, no lesions and atrophic (mild).  Gehrung pessary in place (has been in place for "many years". Has had pessary in for over 50 years. Has not had it removed in several years.  Cervix: normal appearing cervix without discharge or lesions, slightly flushed to vaginal wall.   Uterus: uterus is normal size, shape, consistency and nontender  Adnexa: normal adnexa in size, nontender and no masses  RV: External Exam NormaI, No Rectal Masses and Normal Sphincter tone  MUSCULOSKELETAL: Normal range of motion. No tenderness.  No cyanosis, clubbing, or edema.  2+ distal pulses. LYMPHATIC: No Axillary, Supraclavicular, or Inguinal Adenopathy.   Labs: Lab Results  Component Value Date   WBC 6.7 05/22/2020   HGB 11.8 (L) 05/22/2020   HCT 35.7 (L) 05/22/2020   MCV 92.2 05/22/2020   PLT 223 05/22/2020    Lab Results  Component Value Date   CREATININE 1.03 (H) 05/22/2020   BUN 16 05/22/2020   NA 135 05/22/2020   K 4.0 05/22/2020   CL 103 05/22/2020   CO2 25 05/22/2020    Lab Results  Component Value Date   ALT 11 05/22/2020   AST 17 05/22/2020   ALKPHOS 57 05/22/2020   BILITOT 0.3 05/22/2020    No results  found for: CHOL, HDL, LDLCALC, LDLDIRECT, TRIG, CHOLHDL  No results found for: TSH  No results found for: HGBA1C   Assessment:   Gynecologic exam Menopausal syndrome on hormone replacement therapy H/o breast cancer Cystocele with rectocele Pessay in place  Plan:  -  Pap: Not needed.  Beyond age for screening.  - Mammogram: Scheduled to have q 6 month screenings, is being followed by Oncologist and breast surgeon.  Small nodule noted on today's exam, advised to notify her surgeion.  - Stool Guaiac Testing:  Not Ordered.  Patient up to date on colonoscopy. Due for repeat this year.  - Labs: None ordered. Due for labs in April with PCP - Routine preventative health maintenance measures emphasized: Exercise/Diet/Weight control, Tobacco Warnings, Alcohol/Substance use risks and Stress Management - Up to date on flu vaccine.  - Menopausal, previously with history of long-term use of HRT, but discontinued after diagnosis of breast cancer. Is managing symptoms with lifestyle interventions.  - Pessary in place for pelvic floor prolapse, unable to remove, has been in place for many years. No evidence of lesions or erosions. Minimal discharge on exam today, given reassurance. Advised that if symptoms worsen, can consider use of  Trimo-San gel for maintenance (currently not using anything).. Continue to check annually.  - Has received complete COVID vaccination series.  - Return to [place], MD [facility]

## 2020-12-31 NOTE — Patient Instructions (Signed)

## 2020-12-31 NOTE — Progress Notes (Signed)
Annual exam-pt stated that she is doing well.

## 2021-01-01 ENCOUNTER — Encounter: Payer: Self-pay | Admitting: Obstetrics and Gynecology

## 2021-01-02 ENCOUNTER — Telehealth: Payer: Self-pay

## 2021-01-02 ENCOUNTER — Other Ambulatory Visit: Payer: Self-pay

## 2021-01-02 DIAGNOSIS — D0511 Intraductal carcinoma in situ of right breast: Secondary | ICD-10-CM

## 2021-01-02 NOTE — Telephone Encounter (Signed)
Returned call to pt . Per Pt GYN stated she felt something suspicious in pt's affected breast and wanted pt to let Dr Jana Hakim know. Pt to call [facility] about mammogram that is to be done in April to get it scheduled. Pt currently has an appointment at [facility] on 02/24/21. Will update MD and adjust pt's appointment if needed.

## 2021-01-08 ENCOUNTER — Other Ambulatory Visit: Payer: Self-pay | Admitting: Oncology

## 2021-01-10 ENCOUNTER — Telehealth: Payer: Self-pay | Admitting: Oncology

## 2021-01-10 NOTE — Telephone Encounter (Signed)
Scheduled per 3/15 sch msg. Called pt and confirmed 4/14 appt

## 2021-02-04 DIAGNOSIS — N6310 Unspecified lump in the right breast, unspecified quadrant: Secondary | ICD-10-CM | POA: Diagnosis not present

## 2021-02-04 DIAGNOSIS — Z853 Personal history of malignant neoplasm of breast: Secondary | ICD-10-CM | POA: Diagnosis not present

## 2021-02-05 NOTE — Progress Notes (Signed)
[place]  [phone] [phone]) [phone]     ID: Elizabeth Friedman DOB: 22-Nov-1940  MR#: [id number]  YKD#:[id number]  Patient Care Team: Adin Hector, MD as PCP - General (Internal Medicine) Rockwell Germany, RN as Oncology Nurse Navigator Mauro Kaufmann, RN as Oncology Nurse Navigator Rolm Bookbinder, MD as Consulting Physician (General Surgery) Karmella Bouvier, Virgie Dad, MD as Consulting Physician (Oncology) Eppie Gibson, MD as Attending Physician (Radiation Oncology) Rubie Maid, MD as Consulting Physician (Obstetrics and Gynecology) Chauncey Cruel, MD OTHER MD:  CHIEF COMPLAINT: estrogen receptor positive noninvasive breast cancer  CURRENT TREATMENT: observation   INTERVAL HISTORY: Elizabeth Friedman returns for follow up of her noninvasive breast cancer. She is now under observation.  She completed radiation therapy on 07/30/2020.  She recently saw Dr. Marcelline Mates her gynecologist who palpated a change in the patient's left breast.  Martin Majestic to [facility] and had bilateral mammography with right ultrasonography on 02/04/2021.  There was no discrete mass or palpable area of concern.  There was a pea-sized lump in the periareolar right breast near the lumpectomy scar.  Sonography over these areas showed normal glandular tissue, no solid suspicious mass and near the nipple there was only a normal postsurgical scar.  Since her last visit, she underwent colonoscopy and EGD on 08/14/2020 under Dr. [name]. Pathology [id number]) from a specimen obtained from the esophagus showed: squamous mucosa with no significant pathologic alteration.  She is scheduled for cataract extractions under Dr. Wallace Going-- right on 02/26/2021, left on 03/12/2021.   REVIEW OF SYSTEMS: Elizabeth Friedman continues to live by herself.  She notably does all her housework shopping and cooking but she mows [address].  She enjoys waking up in the morning and looking over the pond where the geese, and there is  usually deer as well.  She is hoping to be able to live there as long as she can manage.  She is planning a large family reunion for Easter.  Aside from these issues a detailed review of systems today was stable   COVID 19 VACCINATION STATUS: [place] x2, status post first booster   HISTORY OF CURRENT ILLNESS: From the original intake note:  Elizabeth Friedman had routine screening mammography on 04/16/2020 showing a possible abnormality in the bilateral breasts. She underwent bilateral diagnostic mammography with tomography at [facility] on 05/09/2020 showing: breast density category B; 1.5 cm grouped heterogeneous punctate calcifications in right breast; left breast area in question not reproduced.  Accordingly on 05/15/2020 she proceeded to biopsy of the right breast area in question. The pathology from this procedure ([id number]) showed: ductal carcinoma in situ with necrosis and calcifications, high grade. Prognostic indicators significant for: estrogen receptor, 95% positive and progesterone receptor, 90% positive, both with strong staining intensity.   The patient's subsequent history is as detailed below.   PAST MEDICAL HISTORY: Past Medical History:  Diagnosis Date  . Allergic rhinitis   . Anginal pain ([place])   . Arthritis   . Atypical chest pain   . Breast cancer ([place])   . Cancer ([place])    skin  . Fibrocystic breast disease   . GERD (gastroesophageal reflux disease)   . Hyperlipidemia   . Lung nodule   . Menopause   . Migraine   . Migraine    optical migraines  . Plantar fasciitis   . 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 Raynaud's disease   . Sciatic leg pain   . Sciatica   . Urinary retention  with incomplete bladder emptying   . Vitreous detachment of both eyes     PAST SURGICAL HISTORY: Past Surgical History:  Procedure Laterality Date  . APPENDECTOMY    . BREAST LUMPECTOMY WITH RADIOACTIVE SEED LOCALIZATION Right 05/29/2020   Procedure: RIGHT BREAST LUMPECTOMY WITH  RADIOACTIVE SEED LOCALIZATION;  Surgeon: Rolm Bookbinder, MD;  Location: [place];  Service: General;  Laterality: Right;  . CARDIAC CATHETERIZATION    . COLONOSCOPY WITH PROPOFOL N/A 03/27/2016   Procedure: COLONOSCOPY WITH PROPOFOL;  Surgeon: Manya Silvas, MD;  Location: [facility] ENDOSCOPY;  Service: Endoscopy;  Laterality: N/A;  . COLONOSCOPY WITH PROPOFOL N/A 08/14/2020   Procedure: COLONOSCOPY WITH PROPOFOL;  Surgeon: Toledo, Benay Pike, MD;  Location: [facility] ENDOSCOPY;  Service: Gastroenterology;  Laterality: N/A;  . ESOPHAGOGASTRODUODENOSCOPY (EGD) WITH PROPOFOL N/A 08/14/2020   Procedure: ESOPHAGOGASTRODUODENOSCOPY (EGD) WITH PROPOFOL;  Surgeon: Toledo, Benay Pike, MD;  Location: [facility] ENDOSCOPY;  Service: Gastroenterology;  Laterality: N/A;  . SKIN CANCER EXCISION     face and neck    FAMILY HISTORY: Family History  Problem Relation Age of Onset  . Breast cancer Mother   . Heart disease Father   . Colon cancer Brother   . Diabetes Neg Hx   . Ovarian cancer Neg Hx    Her father died at age 15 from CVA, MI. Her mother died at age 65 from breast cancer, which she was diagnosed with at age 20.  The patient had one brother, who had a history of colon cancer and died at age 71 from [place].  She had no sisters.  There is no other family history of cancer to her knowledge.   GYNECOLOGIC HISTORY:  No LMP recorded. Patient is postmenopausal. Menarche: 18-89 years old Age at first live birth: 80 years old [place] P 2 LMP "late 18's" Contraceptive: yes, used for approximately 7 years, currently has a pessary in place HRT: yes, had used for many years, discontinued at time of consultation July 2021 Hysterectomy? no BSO? 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 She is widowed. She lives at home by herself with no pets. Daughter Elizabeth Friedman, age 28, works as a Producer, television/film/video here in [place]. Son Elizabeth Friedman, age 7, works as a Engineer, mining in [place]. Elizabeth Friedman has 4 grandchildren and 5 great-grandchildren. She attends a [employer]ic[employer], medical church.    ADVANCED DIRECTIVES: In place.   HEALTH MAINTENANCE: Social History   Tobacco Use  . Smoking status: Never Smoker  . Smokeless tobacco: Never Used  Vaping Use  . Vaping Use: Never used  Substance Use Topics  . Alcohol use: No  . Drug use: No     Colonoscopy: 07/2020 (Dr. [name])  PAP: 2014?  Bone density: 2019?   No Known Allergies  Current Outpatient Medications  Medication Sig Dispense Refill  . acetaminophen (TYLENOL) 500 MG tablet Take 500 mg by mouth every 6 (six) hours as needed.    Marland Kitchen aspirin EC 81 MG tablet Take 81 mg by mouth daily.    . Calcium Acetate-Magnesium Carb (MAGNEBIND 200 PO) Take by mouth.    . calcium carbonate 1250 MG capsule Take 1,250 mg by mouth 2 (two) times daily with a meal.    . celecoxib (CELEBREX) 200 MG capsule Take 200 mg by mouth 2 (two) times daily.    . cholecalciferol (VITAMIN D) 1000 units tablet Take 1,000 Units by mouth daily.    Marland Kitchen co-enzyme Q-10 30  MG capsule Take 30 mg by mouth 3 (three) times daily.    Marland Kitchen gabapentin (NEURONTIN) 100 MG capsule Take 100 mg by mouth 3 (three) times daily.    . Omega-3 Fatty Acids (FISH OIL) 1000 MG CAPS Take by mouth.    Marland Kitchen omeprazole (PRILOSEC) 20 MG capsule Take 20 mg by mouth daily.    . RESTASIS 0.05 % ophthalmic emulsion     . simvastatin (ZOCOR) 10 MG tablet Take 10 mg by mouth daily.    Marland Kitchen topiramate (TOPAMAX) 50 MG tablet Take 50 mg by mouth daily.    Marland Kitchen triamcinolone (NASACORT ALLERGY 24HR) 55 MCG/ACT AERO nasal inhaler Place 2 sprays into the nose daily.     No current facility-administered medications for this visit.    OBJECTIVE: White woman in no acute distress  Vitals:   02/06/21 1314  BP: 129/61  Friedman: 72  Resp: 16  Temp: (!) 97.4 F (36.3 C)  SpO2: 100%     Body mass index is 19.59 kg/m.   Wt Readings from Last 3 Encounters:  02/06/21 107 lb 1.6  oz (48.6 kg)  12/31/20 110 lb 6.4 oz (50.1 kg)  08/14/20 105 lb (47.6 kg)      ECOG FS:1 - Symptomatic but completely ambulatory  Sclerae unicteric, EOMs intact Wearing a mask No cervical or supraclavicular adenopathy Lungs no rales or rhonchi Heart regular rate and rhythm Abd soft, nontender, positive bowel sounds MSK no focal spinal tenderness, no upper extremity lymphedema Neuro: nonfocal, well oriented, appropriate affect Breasts: The right breast is status postlumpectomy and radiation.  There is no evidence of local recurrence.  The left breast is benign.  Both axillae are benign.   LAB RESULTS:  CMP     Component Value Date/Time   NA 135 05/22/2020 0804   K 4.0 05/22/2020 0804   CL 103 05/22/2020 0804   CO2 25 05/22/2020 0804   GLUCOSE 88 05/22/2020 0804   BUN 16 05/22/2020 0804   CREATININE 1.03 (H) 05/22/2020 0804   CALCIUM 10.0 05/22/2020 0804   PROT 6.5 05/22/2020 0804   ALBUMIN 3.8 05/22/2020 0804   AST 17 05/22/2020 0804   ALT 11 05/22/2020 0804   ALKPHOS 57 05/22/2020 0804   BILITOT 0.3 05/22/2020 0804   GFRNONAA 52 (L) 05/22/2020 0804   GFRAA 60 (L) 05/22/2020 0804    No results found for: TOTALPROTELP, ALBUMINELP, A1GS, A2GS, BETS, BETA2SER, GAMS, MSPIKE, SPEI  Lab Results  Component Value Date   WBC 6.7 05/22/2020   NEUTROABS 4.6 05/22/2020   HGB 11.8 (L) 05/22/2020   HCT 35.7 (L) 05/22/2020   MCV 92.2 05/22/2020   PLT 223 05/22/2020    No results found for: LABCA2  No components found for: OMVEHM094  No results for input(s): INR in the last 168 hours.  No results found for: LABCA2  No results found for: BSJ628  No results found for: ZMO294  No results found for: TML465  No results found for: CA2729  No components found for: HGQUANT  No results found for: CEA1 / No results found for: CEA1   No results found for: AFPTUMOR  No results found for: CHROMOGRNA  No results found for: KPAFRELGTCHN, LAMBDASER,  KAPLAMBRATIO (kappa/lambda light chains)  No results found for: HGBA, HGBA2QUANT, HGBFQUANT, HGBSQUAN (Hemoglobinopathy evaluation)   No results found for: LDH  No results found for: IRON, TIBC, IRONPCTSAT (Iron and TIBC)  No results found for: FERRITIN  Urinalysis No results found for: COLORURINE, APPEARANCEUR, [place], [place],  GLUCOSEU, HGBUR, BILIRUBINUR, KETONESUR, PROTEINUR, UROBILINOGEN, NITRITE, LEUKOCYTESUR   STUDIES: No results found.   ELIGIBLE FOR AVAILABLE RESEARCH PROTOCOL: AET  ASSESSMENT: 80 y.o. [place] woman status post right breast biopsy 05/15/2020 for ductal carcinoma in situ, grade 3, strongly estrogen and progesterone receptor positive  (1) status post right lumpectomy 05/29/2020 for ductal carcinoma in situ, high-grade, measuring 2.2 cm, with negative margins  (2) adjuvant radiation 07/03/2020 through 07/30/2020 Site Technique Total Dose (Gy) Dose per Fx (Gy) Completed Fx Beam Energies  Breast, Right: Breast_Rt 3D 42.56/42.56 2.66 16/16 6X, 10X  Breast, Right: Breast_Rt_Bst specialPort 8/8 2 4/4 6E, 9E   (3) considered antiestrogens, opted against  (a) the patient discontinued hormone replacement 05/22/2020   PLAN: Elizabeth Friedman will soon be a year out from definitive surgery for her noninvasive breast cancer.  There is no evidence of recurrence.  We reviewed her mammography ultrasonography on today's exam all of which was very reassuring.  She is already scheduled for repeat mammography at [facility] 02/05/2022.  Elizabeth Friedman would like to move her oncology visits closer to home.  I have asked one of my partners in [place], Dr Rogue Bussing, who specializes in breast cancer to take over her care and he is agreeable. I will go ahead and make an appointment for her with him in May 2023.  We are now planning on future follow-up here but I will be glad to see Elizabeth Friedman at any point if the need arises  Total encounter time 20 minutes.Elizabeth Jews C. Tatiyanna Lashley, MD 02/06/2021 1:32  PM Medical Oncology and Hematology [facility] [place], [place] [address] Tel. [phone]    Fax. [phone]   This document serves as a record of services personally performed by Lurline Del, MD. It was created on his behalf by Wilburn Mylar, a trained medical scribe. The creation of this record is based on the scribe's personal observations and the provider's statements to them.   I, Lurline Del MD, have reviewed the above documentation for accuracy and completeness, and I agree with the above.   *Total Encounter Time as defined by the Centers for Medicare and Medicaid Services includes, in addition to the face-to-face time of a patient visit (documented in the note above) non-face-to-face time: obtaining and reviewing outside history, ordering and reviewing medications, tests or procedures, care coordination (communications with other health care professionals or caregivers) and documentation in the medical record.

## 2021-02-06 ENCOUNTER — Inpatient Hospital Stay: Payer: PPO | Attending: Oncology | Admitting: Oncology

## 2021-02-06 ENCOUNTER — Other Ambulatory Visit: Payer: Self-pay

## 2021-02-06 VITALS — BP 129/61 | HR 72 | Temp 97.4°F | Resp 16 | Ht 62.0 in | Wt 107.1 lb

## 2021-02-06 DIAGNOSIS — Z923 Personal history of irradiation: Secondary | ICD-10-CM | POA: Insufficient documentation

## 2021-02-06 DIAGNOSIS — D0511 Intraductal carcinoma in situ of right breast: Secondary | ICD-10-CM | POA: Diagnosis not present

## 2021-02-06 DIAGNOSIS — C4490 Unspecified malignant neoplasm of skin, unspecified: Secondary | ICD-10-CM | POA: Diagnosis not present

## 2021-02-06 DIAGNOSIS — Z86000 Personal history of in-situ neoplasm of breast: Secondary | ICD-10-CM | POA: Insufficient documentation

## 2021-02-07 ENCOUNTER — Encounter: Payer: Self-pay | Admitting: Oncology

## 2021-02-11 DIAGNOSIS — H2511 Age-related nuclear cataract, right eye: Secondary | ICD-10-CM | POA: Diagnosis not present

## 2021-02-13 ENCOUNTER — Encounter: Payer: Self-pay | Admitting: Ophthalmology

## 2021-02-14 ENCOUNTER — Encounter: Payer: Self-pay | Admitting: Internal Medicine

## 2021-02-24 ENCOUNTER — Ambulatory Visit: Payer: PPO | Admitting: Oncology

## 2021-02-24 NOTE — Discharge Instructions (Signed)

## 2021-02-26 ENCOUNTER — Ambulatory Visit: Payer: PPO | Admitting: Anesthesiology

## 2021-02-26 ENCOUNTER — Encounter: Payer: Self-pay | Admitting: Ophthalmology

## 2021-02-26 ENCOUNTER — Ambulatory Visit
Admission: RE | Admit: 2021-02-26 | Discharge: 2021-02-26 | Disposition: A | Discharge status: Home / Self Care | Payer: PPO | Source: Ambulatory Visit | Attending: Ophthalmology | Admitting: Ophthalmology

## 2021-02-26 ENCOUNTER — Encounter
Admission: RE | Disposition: A | Discharge status: Home / Self Care | Payer: Self-pay | Source: Ambulatory Visit | Attending: Ophthalmology

## 2021-02-26 ENCOUNTER — Other Ambulatory Visit: Payer: Self-pay

## 2021-02-26 DIAGNOSIS — Z791 Long term (current) use of non-steroidal anti-inflammatories (NSAID): Secondary | ICD-10-CM | POA: Insufficient documentation

## 2021-02-26 DIAGNOSIS — Z79899 Other long term (current) drug therapy: Secondary | ICD-10-CM | POA: Insufficient documentation

## 2021-02-26 DIAGNOSIS — H2511 Age-related nuclear cataract, right eye: Secondary | ICD-10-CM | POA: Diagnosis not present

## 2021-02-26 DIAGNOSIS — H25811 Combined forms of age-related cataract, right eye: Secondary | ICD-10-CM | POA: Diagnosis not present

## 2021-02-26 DIAGNOSIS — Z7982 Long term (current) use of aspirin: Secondary | ICD-10-CM | POA: Diagnosis not present

## 2021-02-26 DIAGNOSIS — Z9049 Acquired absence of other specified parts of digestive tract: Secondary | ICD-10-CM | POA: Diagnosis not present

## 2021-02-26 DIAGNOSIS — Z85828 Personal history of other malignant neoplasm of skin: Secondary | ICD-10-CM | POA: Insufficient documentation

## 2021-02-26 DIAGNOSIS — Z853 Personal history of malignant neoplasm of breast: Secondary | ICD-10-CM | POA: Diagnosis not present

## 2021-02-26 HISTORY — PX: CATARACT EXTRACTION W/PHACO: SHX586

## 2021-02-26 HISTORY — DX: Presence of external hearing-aid: Z97.4

## 2021-02-26 HISTORY — DX: Anemia, unspecified: D64.9

## 2021-02-26 HISTORY — DX: Motion sickness, initial encounter: T75.3XXA

## 2021-02-26 SURGERY — PHACOEMULSIFICATION, CATARACT, WITH IOL INSERTION
Anesthesia: Monitor Anesthesia Care | Site: Eye | Laterality: Right

## 2021-02-26 MED ORDER — FENTANYL CITRATE (PF) 100 MCG/2ML IJ SOLN
INTRAMUSCULAR | Status: DC | PRN
  Administered 2021-02-26: 50 ug via INTRAVENOUS

## 2021-02-26 MED ORDER — NA HYALUR & NA CHOND-NA HYALUR 0.4-0.35 ML IO KIT
PACK | INTRAOCULAR | Status: DC | PRN
  Administered 2021-02-26: 1 mL via INTRAOCULAR

## 2021-02-26 MED ORDER — ACETAMINOPHEN 325 MG PO TABS
325.0000 mg | ORAL_TABLET | ORAL | Status: DC | PRN
Start: 2021-02-26 — End: 2021-02-26

## 2021-02-26 MED ORDER — TETRACAINE HCL 0.5 % OP SOLN
1.0000 [drp] | OPHTHALMIC | Status: DC | PRN
  Administered 2021-02-26 (×3): 1 [drp] via OPHTHALMIC

## 2021-02-26 MED ORDER — ACETAMINOPHEN 160 MG/5ML PO SOLN: 325.0000 mg | ORAL | Status: DC | PRN

## 2021-02-26 MED ORDER — MIDAZOLAM HCL 2 MG/2ML IJ SOLN
INTRAMUSCULAR | Status: DC | PRN
  Administered 2021-02-26: 1 mg via INTRAVENOUS

## 2021-02-26 MED ORDER — ONDANSETRON HCL 4 MG/2ML IJ SOLN: 4.0000 mg | Freq: Once | INTRAMUSCULAR | Status: DC | PRN

## 2021-02-26 MED ORDER — ARMC OPHTHALMIC DILATING DROPS
1.0000 "application " | OPHTHALMIC | Status: DC | PRN
  Administered 2021-02-26 (×3): 1 via OPHTHALMIC

## 2021-02-26 MED ORDER — EPINEPHRINE PF 1 MG/ML IJ SOLN
INTRAOCULAR | Status: DC | PRN
  Administered 2021-02-26: 66 mL via OPHTHALMIC

## 2021-02-26 MED ORDER — CEFUROXIME OPHTHALMIC INJECTION 1 MG/0.1 ML
INJECTION | OPHTHALMIC | Status: DC | PRN
  Administered 2021-02-26: 0.1 mL via INTRACAMERAL

## 2021-02-26 MED ORDER — BRIMONIDINE TARTRATE-TIMOLOL 0.2-0.5 % OP SOLN
OPHTHALMIC | Status: DC | PRN
  Administered 2021-02-26: 1 [drp] via OPHTHALMIC

## 2021-02-26 MED ORDER — LIDOCAINE HCL (PF) 2 % IJ SOLN
INTRAOCULAR | Status: DC | PRN
  Administered 2021-02-26: 1 mL

## 2021-02-26 SURGICAL SUPPLY — 19 items
CANNULA ANT/CHMB 27GA (MISCELLANEOUS) ×2 IMPLANT
GLOVE SURG ENC TEXT LTX SZ7.5 (GLOVE) ×2 IMPLANT
GLOVE SURG TRIUMPH 8.0 PF LTX (GLOVE) ×2 IMPLANT
GOWN STRL REUS W/ TWL LRG LVL3 (GOWN DISPOSABLE) ×2 IMPLANT
GOWN STRL REUS W/TWL LRG LVL3 (GOWN DISPOSABLE) ×4
LENS IOL DIOP 20.5 (Intraocular Lens) ×2 IMPLANT
LENS IOL TECNIS MONO 20.5 (Intraocular Lens) ×1 IMPLANT
MARKER SKIN DUAL TIP RULER LAB (MISCELLANEOUS) ×2 IMPLANT
NEEDLE CAPSULORHEX 25GA (NEEDLE) ×2 IMPLANT
NEEDLE FILTER BLUNT 18X 1/2SAF (NEEDLE) ×2
NEEDLE FILTER BLUNT 18X1 1/2 (NEEDLE) ×2 IMPLANT
PACK CATARACT BRASINGTON (MISCELLANEOUS) ×2 IMPLANT
PACK EYE AFTER SURG (MISCELLANEOUS) ×2 IMPLANT
PACK OPTHALMIC (MISCELLANEOUS) ×2 IMPLANT
SOLUTION OPHTHALMIC SALT (MISCELLANEOUS) ×2 IMPLANT
SYR 3ML LL SCALE MARK (SYRINGE) ×4 IMPLANT
SYR TB 1ML LUER SLIP (SYRINGE) ×2 IMPLANT
WATER STERILE IRR 250ML POUR (IV SOLUTION) ×2 IMPLANT
WIPE NON LINTING 3.25X3.25 (MISCELLANEOUS) ×2 IMPLANT

## 2021-02-26 NOTE — H&P (Signed)
[facility]   Primary Care Physician:  Adin Hector, MD Ophthalmologist: Dr. Leandrew Koyanagi  Pre-Procedure History & Physical: HPI:  Elizabeth Friedman is a 80 y.o. female here for ophthalmic surgery.   Past Medical History:  Diagnosis Date  . Allergic rhinitis   . Anemia   . Anginal pain ([place])   . Arthritis   . Atypical chest pain   . Breast cancer ([place])   . Cancer ([place])    skin  . Fibrocystic breast disease   . GERD (gastroesophageal reflux disease)   . Hearing aid worn    bilateral  . Hyperlipidemia   . Lung nodule   . Menopause   . Migraine   . Migraine    optical migraines  . Motion sickness   . Plantar fasciitis   . 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 Raynaud's disease   . Sciatic leg pain   . Sciatica   . Urinary retention with incomplete bladder emptying   . Vitreous detachment of both eyes     Past Surgical History:  Procedure Laterality Date  . APPENDECTOMY    . BREAST LUMPECTOMY WITH RADIOACTIVE SEED LOCALIZATION Right 05/29/2020   Procedure: RIGHT BREAST LUMPECTOMY WITH RADIOACTIVE SEED LOCALIZATION;  Surgeon: Rolm Bookbinder, MD;  Location: [place];  Service: General;  Laterality: Right;  . CARDIAC CATHETERIZATION    . COLONOSCOPY WITH PROPOFOL N/A 03/27/2016   Procedure: COLONOSCOPY WITH PROPOFOL;  Surgeon: Manya Silvas, MD;  Location: [facility] ENDOSCOPY;  Service: Endoscopy;  Laterality: N/A;  . COLONOSCOPY WITH PROPOFOL N/A 08/14/2020   Procedure: COLONOSCOPY WITH PROPOFOL;  Surgeon: Toledo, Benay Pike, MD;  Location: [facility] ENDOSCOPY;  Service: Gastroenterology;  Laterality: N/A;  . ESOPHAGOGASTRODUODENOSCOPY (EGD) WITH PROPOFOL N/A 08/14/2020   Procedure: ESOPHAGOGASTRODUODENOSCOPY (EGD) WITH PROPOFOL;  Surgeon: Toledo, Benay Pike, MD;  Location: [facility] ENDOSCOPY;  Service: Gastroenterology;  Laterality: N/A;  . SKIN CANCER EXCISION     face and neck    Prior to Admission medications   Medication Sig  Start Date End Date Taking? Authorizing Provider  acetaminophen (TYLENOL) 500 MG tablet Take 500 mg by mouth every 6 (six) hours as needed.   Yes [provider]  aspirin EC 81 MG tablet Take 81 mg by mouth daily.   Yes [provider]  Calcium Acetate-Magnesium Carb (MAGNEBIND 200 PO) Take by mouth.   Yes [provider]  calcium carbonate 1250 MG capsule Take 1,250 mg by mouth 2 (two) times daily with a meal.   Yes [provider]  celecoxib (CELEBREX) 200 MG capsule Take 200 mg by mouth 2 (two) times daily.   Yes [provider]  cholecalciferol (VITAMIN D) 1000 units tablet Take 1,000 Units by mouth daily.   Yes [provider]  co-enzyme Q-10 30 MG capsule Take 30 mg by mouth 3 (three) times daily.   Yes [provider]  gabapentin (NEURONTIN) 100 MG capsule Take 100 mg by mouth 3 (three) times daily.   Yes [provider]  Omega-3 Fatty Acids (FISH OIL) 1000 MG CAPS Take by mouth.   Yes [provider]  omeprazole (PRILOSEC) 20 MG capsule Take 20 mg by mouth 2 (two) times daily before a meal.   Yes [provider]  simvastatin (ZOCOR) 10 MG tablet Take 10 mg by mouth daily.   Yes [provider]  topiramate (TOPAMAX) 50 MG tablet Take 50 mg by mouth daily. 12/22/19  Yes [provider]  triamcinolone (NASACORT) 55 MCG/ACT AERO nasal inhaler Place 2 sprays into the nose daily.   Yes [provider]  RESTASIS 0.05 % ophthalmic emulsion  10/24/15   [provider]    Allergies as of 01/02/2021  . (No Known Allergies)    Family History  Problem Relation Age of Onset  . Breast cancer Mother   . Heart disease Father   . Colon cancer Brother   . Diabetes Neg Hx   . Ovarian cancer Neg Hx     Social History   Socioeconomic History  . Marital status: Widowed    Spouse name: Not on file  . Number of children: Not on file  . Years of education: Not on file  .  Highest education level: Not on file  Occupational History  . Not on file  Tobacco Use  . Smoking status: Never Smoker  . Smokeless tobacco: Never Used  Vaping Use  . Vaping Use: Never used  Substance and Sexual Activity  . Alcohol use: No  . Drug use: No  . Sexual activity: Not Currently    Birth control/protection: Post-menopausal  Other Topics Concern  . Not on file  Social History Narrative  . Not on file   Social Determinants of Health   Financial Resource Strain: Not on file  Food Insecurity: Not on file  Transportation Needs: Not on file  Physical Activity: Not on file  Stress: Not on file  Social Connections: Not on file  Intimate Partner Violence: Not on file    Review of Systems: See HPI, otherwise negative ROS  Physical Exam: BP (!) 170/95   Pulse 79   Temp 97.6 F (36.4 C) (Temporal)   Resp 18   Ht 5\' 2"  (1.575 m)   Wt 45.4 kg   SpO2 100%   BMI 18.29 kg/m  General:   Alert,  pleasant and cooperative in NAD Head:  Normocephalic and atraumatic. Lungs:  Clear to auscultation.    Heart:  Regular rate and rhythm.   Impression/Plan: Elizabeth Friedman is here for ophthalmic surgery.  Risks, benefits, limitations, and alternatives regarding ophthalmic surgery have been reviewed with the patient.  Questions have been answered.  All parties agreeable.   Leandrew Koyanagi, MD  02/26/2021, 9:58 AM

## 2021-02-26 NOTE — Transfer of Care (Signed)
Immediate Anesthesia Transfer of Care Note  Patient: Elizabeth Friedman  Procedure(s) Performed: CATARACT EXTRACTION PHACO AND INTRAOCULAR LENS PLACEMENT (IOC) RIGHT 5.86 01:19.6 7.4% (Right Eye)  Patient Location: PACU  Anesthesia Type: MAC  Level of Consciousness: awake, alert  and patient cooperative  Airway and Oxygen Therapy: Patient Spontanous Breathing   Post-op Assessment: Post-op Vital signs reviewed, Patient's Cardiovascular Status Stable, Respiratory Function Stable, Patent Airway and No signs of Nausea or vomiting  Post-op Vital Signs: Reviewed and stable  Complications: No complications documented.

## 2021-02-26 NOTE — Anesthesia Procedure Notes (Signed)
Procedure Name: MAC Performed by: Elijah Michaelis, CRNA Pre-anesthesia Checklist: Patient identified, Emergency Drugs available, Suction available, Timeout performed and Patient being monitored Patient Re-evaluated:Patient Re-evaluated prior to induction Oxygen Delivery Method: Nasal cannula Placement Confirmation: positive ETCO2       

## 2021-02-26 NOTE — Op Note (Signed)
  LOCATION:  [place]   PREOPERATIVE DIAGNOSIS:    Nuclear sclerotic cataract right eye. H25.11   POSTOPERATIVE DIAGNOSIS:  Nuclear sclerotic cataract right eye.     PROCEDURE:  Phacoemusification with posterior chamber intraocular lens placement of the right eye   ULTRASOUND TIME: Procedure(s): CATARACT EXTRACTION PHACO AND INTRAOCULAR LENS PLACEMENT (IOC) RIGHT 5.86 01:19.6 7.4% (Right)  LENS:   Implant Name Type Inv. Item Serial No. Manufacturer Lot No. LRB No. Used Action  LENS IOL DIOP 20.5 - [id number] Intraocular Lens LENS IOL DIOP 20.5 [id number]   Right 1 Implanted         SURGEON:  Wyonia Hough, MD   ANESTHESIA:  Topical with tetracaine drops and 2% Xylocaine jelly, augmented with 1% preservative-free intracameral lidocaine.    COMPLICATIONS:  None.   DESCRIPTION OF PROCEDURE:  The patient was identified in the holding room and transported to the operating room and placed in the supine position under the operating microscope.  The right eye was identified as the operative eye and it was prepped and draped in the usual sterile ophthalmic fashion.   A 1 millimeter clear-corneal paracentesis was made at the 12:00 position.  0.5 ml of preservative-free 1% lidocaine was injected into the anterior chamber. The anterior chamber was filled with Viscoat viscoelastic.  A 2.4 millimeter keratome was used to make a near-clear corneal incision at the 9:00 position.  A curvilinear capsulorrhexis was made with a cystotome and capsulorrhexis forceps.  Balanced salt solution was used to hydrodissect and hydrodelineate the nucleus.   Phacoemulsification was then used in stop and chop fashion to remove the lens nucleus and epinucleus.  The remaining cortex was then removed using the irrigation and aspiration handpiece. Provisc was then placed into the capsular bag to distend it for lens placement.  A lens was then injected into the capsular bag.  The remaining  viscoelastic was aspirated.   Wounds were hydrated with balanced salt solution.  The anterior chamber was inflated to a physiologic pressure with balanced salt solution.  No wound leaks were noted. Cefuroxime 0.1 ml of a 10mg /ml solution was injected into the anterior chamber for a dose of 1 mg of intracameral antibiotic at the completion of the case.   Timolol and Brimonidine drops were applied to the eye.  The patient was taken to the recovery room in stable condition without complications of anesthesia or surgery.   Loyde Orth 02/26/2021, 11:06 AM

## 2021-02-26 NOTE — Anesthesia Postprocedure Evaluation (Signed)
Anesthesia Post Note  Patient: Elizabeth Friedman  Procedure(s) Performed: CATARACT EXTRACTION PHACO AND INTRAOCULAR LENS PLACEMENT (IOC) RIGHT 5.86 01:19.6 7.4% (Right Eye)     Patient location during evaluation: PACU Anesthesia Type: MAC Level of consciousness: awake and alert Pain management: pain level controlled Vital Signs Assessment: post-procedure vital signs reviewed and stable Respiratory status: spontaneous breathing Cardiovascular status: blood pressure returned to baseline Postop Assessment: no apparent nausea or vomiting, adequate PO intake and no headache Anesthetic complications: no   No complications documented.  Adele Barthel Laquon Emel

## 2021-02-26 NOTE — Anesthesia Preprocedure Evaluation (Signed)
Anesthesia Evaluation  Patient identified by MRN, date of birth, ID band Patient awake    History of Anesthesia Complications Negative for: history of anesthetic complications  Airway Mallampati: II  TM Distance: >3 FB Neck ROM: Full    Dental no notable dental hx.    Pulmonary neg pulmonary ROS,    Pulmonary exam normal        Cardiovascular Exercise Tolerance: Good negative cardio ROS Normal cardiovascular exam     Neuro/Psych  Headaches (takes Topamax),    GI/Hepatic Neg liver ROS, GERD  Medicated and Controlled,  Endo/Other  negative endocrine ROS  Renal/GU negative Renal ROS     Musculoskeletal negative musculoskeletal ROS (+)   Abdominal   Peds  Hematology negative hematology ROS (+)   Anesthesia Other Findings   Reproductive/Obstetrics                             Anesthesia Physical Anesthesia Plan  ASA: II  Anesthesia Plan: MAC   Post-op Pain Management:    Induction: Intravenous  PONV Risk Score and Plan: 2 and TIVA, Midazolam and Treatment may vary due to age or medical condition  Airway Management Planned: Nasal Cannula and Natural Airway  Additional Equipment: None  Intra-op Plan:   Post-operative Plan:   Informed Consent: I have reviewed the patients History and Physical, chart, labs and discussed the procedure including the risks, benefits and alternatives for the proposed anesthesia with the patient or authorized representative who has indicated his/her understanding and acceptance.       Plan Discussed with: CRNA  Anesthesia Plan Comments:         Anesthesia Quick Evaluation

## 2021-02-27 ENCOUNTER — Encounter: Payer: Self-pay | Admitting: Ophthalmology

## 2021-03-04 ENCOUNTER — Encounter: Payer: Self-pay | Admitting: Anesthesiology

## 2021-03-04 DIAGNOSIS — H2512 Age-related nuclear cataract, left eye: Secondary | ICD-10-CM | POA: Diagnosis not present

## 2021-03-06 DIAGNOSIS — Z86000 Personal history of in-situ neoplasm of breast: Secondary | ICD-10-CM | POA: Diagnosis not present

## 2021-03-06 DIAGNOSIS — D500 Iron deficiency anemia secondary to blood loss (chronic): Secondary | ICD-10-CM | POA: Diagnosis not present

## 2021-03-06 DIAGNOSIS — E785 Hyperlipidemia, unspecified: Secondary | ICD-10-CM | POA: Diagnosis not present

## 2021-03-12 ENCOUNTER — Ambulatory Visit: Admission: RE | Admit: 2021-03-12 | Payer: PPO | Source: Ambulatory Visit | Admitting: Ophthalmology

## 2021-03-12 SURGERY — PHACOEMULSIFICATION, CATARACT, WITH IOL INSERTION
Anesthesia: Topical | Laterality: Left

## 2021-03-14 DIAGNOSIS — R1013 Epigastric pain: Secondary | ICD-10-CM | POA: Diagnosis not present

## 2021-03-14 DIAGNOSIS — R109 Unspecified abdominal pain: Secondary | ICD-10-CM | POA: Diagnosis not present

## 2021-03-14 DIAGNOSIS — E785 Hyperlipidemia, unspecified: Secondary | ICD-10-CM | POA: Diagnosis not present

## 2021-03-14 DIAGNOSIS — D500 Iron deficiency anemia secondary to blood loss (chronic): Secondary | ICD-10-CM | POA: Diagnosis not present

## 2021-03-14 DIAGNOSIS — Z86000 Personal history of in-situ neoplasm of breast: Secondary | ICD-10-CM | POA: Diagnosis not present

## 2021-03-14 DIAGNOSIS — J8410 Pulmonary fibrosis, unspecified: Secondary | ICD-10-CM | POA: Diagnosis not present

## 2021-03-14 DIAGNOSIS — I6523 Occlusion and stenosis of bilateral carotid arteries: Secondary | ICD-10-CM | POA: Diagnosis not present

## 2021-03-14 DIAGNOSIS — K219 Gastro-esophageal reflux disease without esophagitis: Secondary | ICD-10-CM | POA: Diagnosis not present

## 2021-03-17 DIAGNOSIS — K31811 Angiodysplasia of stomach and duodenum with bleeding: Secondary | ICD-10-CM | POA: Diagnosis not present

## 2021-03-17 DIAGNOSIS — R1013 Epigastric pain: Secondary | ICD-10-CM | POA: Diagnosis not present

## 2021-03-17 DIAGNOSIS — Z800 Family history of malignant neoplasm of digestive organs: Secondary | ICD-10-CM | POA: Diagnosis not present

## 2021-03-17 DIAGNOSIS — K222 Esophageal obstruction: Secondary | ICD-10-CM | POA: Diagnosis not present

## 2021-03-17 DIAGNOSIS — D500 Iron deficiency anemia secondary to blood loss (chronic): Secondary | ICD-10-CM | POA: Diagnosis not present

## 2021-03-18 DIAGNOSIS — K222 Esophageal obstruction: Secondary | ICD-10-CM | POA: Diagnosis not present

## 2021-03-18 DIAGNOSIS — K449 Diaphragmatic hernia without obstruction or gangrene: Secondary | ICD-10-CM | POA: Diagnosis not present

## 2021-03-20 ENCOUNTER — Encounter: Payer: Self-pay | Admitting: Internal Medicine

## 2021-03-21 ENCOUNTER — Ambulatory Visit
Admission: RE | Admit: 2021-03-21 | Discharge: 2021-03-21 | Disposition: A | Discharge status: Home / Self Care | Payer: PPO | Attending: Internal Medicine | Admitting: Internal Medicine

## 2021-03-21 ENCOUNTER — Other Ambulatory Visit: Payer: Self-pay

## 2021-03-21 ENCOUNTER — Ambulatory Visit: Payer: PPO | Admitting: Registered Nurse

## 2021-03-21 ENCOUNTER — Encounter
Admission: RE | Disposition: A | Discharge status: Home / Self Care | Payer: Self-pay | Source: Home / Self Care | Attending: Internal Medicine

## 2021-03-21 ENCOUNTER — Encounter: Payer: Self-pay | Admitting: Internal Medicine

## 2021-03-21 DIAGNOSIS — K449 Diaphragmatic hernia without obstruction or gangrene: Secondary | ICD-10-CM | POA: Diagnosis not present

## 2021-03-21 DIAGNOSIS — Z79899 Other long term (current) drug therapy: Secondary | ICD-10-CM | POA: Insufficient documentation

## 2021-03-21 DIAGNOSIS — K222 Esophageal obstruction: Secondary | ICD-10-CM | POA: Insufficient documentation

## 2021-03-21 DIAGNOSIS — D500 Iron deficiency anemia secondary to blood loss (chronic): Secondary | ICD-10-CM | POA: Diagnosis not present

## 2021-03-21 DIAGNOSIS — Z7982 Long term (current) use of aspirin: Secondary | ICD-10-CM | POA: Diagnosis not present

## 2021-03-21 DIAGNOSIS — K219 Gastro-esophageal reflux disease without esophagitis: Secondary | ICD-10-CM | POA: Insufficient documentation

## 2021-03-21 DIAGNOSIS — Z791 Long term (current) use of non-steroidal anti-inflammatories (NSAID): Secondary | ICD-10-CM | POA: Diagnosis not present

## 2021-03-21 HISTORY — PX: ESOPHAGOGASTRODUODENOSCOPY (EGD) WITH PROPOFOL: SHX5813

## 2021-03-21 SURGERY — ESOPHAGOGASTRODUODENOSCOPY (EGD) WITH PROPOFOL
Anesthesia: General

## 2021-03-21 MED ORDER — PROPOFOL 10 MG/ML IV BOLUS
INTRAVENOUS | Status: DC | PRN
  Administered 2021-03-21: 50 mg via INTRAVENOUS

## 2021-03-21 MED ORDER — LIDOCAINE HCL (CARDIAC) PF 100 MG/5ML IV SOSY
PREFILLED_SYRINGE | INTRAVENOUS | Status: DC | PRN
  Administered 2021-03-21: 40 mg via INTRAVENOUS

## 2021-03-21 MED ORDER — SODIUM CHLORIDE 0.9 % IV SOLN: INTRAVENOUS | Status: DC

## 2021-03-21 MED ORDER — PROPOFOL 500 MG/50ML IV EMUL
INTRAVENOUS | Status: DC | PRN
  Administered 2021-03-21: 125 ug/kg/min via INTRAVENOUS

## 2021-03-21 NOTE — Op Note (Signed)
[facility] Gastroenterology Patient Name: Elizabeth Friedman Procedure Date: 03/18/2021 11:40 AM MRN: [id number] Account #: [id number] Date of Birth: 07-26-1941 Admit Type: Outpatient Age: 80 Room: [facility] ENDO ROOM 3 Gender: Female Note Status: Finalized Procedure:             Upper GI endoscopy Indications:           Iron deficiency anemia secondary to chronic blood                         loss, Suspected upper gastrointestinal bleeding Providers:             Benay Pike. Jimel Myler MD, MD Medicines:             Propofol per Anesthesia Complications:         No immediate complications. Procedure:             Pre-Anesthesia Assessment:                        - The risks and benefits of the procedure and the                         sedation options and risks were discussed with the                         patient. All questions were answered and informed                         consent was obtained.                        - Patient identification and proposed procedure were                         verified prior to the procedure by the nurse. The                         procedure was verified in the procedure room.                        - ASA Grade Assessment: III - A patient with severe                         systemic disease.                        - After reviewing the risks and benefits, the patient                         was deemed in satisfactory condition to undergo the                         procedure.                        After obtaining informed consent, the endoscope was                         passed under direct vision. Throughout the procedure,  the patient's blood pressure, pulse, and oxygen                         saturations were monitored continuously. The Endoscope                         was introduced through the mouth, and advanced to the                         third part of duodenum. The upper GI endoscopy was                          accomplished without difficulty. The patient tolerated                         the procedure well. Findings:      A mild Schatzki ring was found in the lower third of the esophagus.      A 1 cm hiatal hernia was present.      There is no endoscopic evidence of mucosal friability, gastric antral       vascular ectasia, petechia, angiodysplasia, angioectasia or Dieulafoy       lesions in the entire examined stomach.      The examined duodenum was normal.      The exam was otherwise without abnormality. Impression:            - Mild Schatzki ring.                        - 1 cm hiatal hernia.                        - Normal examined duodenum.                        - The examination was otherwise normal.                        - No specimens collected. Recommendation:        - Patient has a contact number available for                         emergencies. The signs and symptoms of potential                         delayed complications were discussed with the patient.                         Return to normal activities tomorrow. Written                         discharge instructions were provided to the patient.                        - Resume previous diet.                        - Continue present medications.                        - Check hemoglobin weekly  x 3 weeks.                        - To visualize the small bowel, perform video capsule                         endoscopy though patient has declined in the past.                        - Return to physician assistant in 1 month.                        - The findings and recommendations were discussed with                         the patient.                        - Follow up with Octavia Bruckner, PA-C in 1 months. Procedure Code(s):     --- Professional ---                        [phone], Esophagogastroduodenoscopy, flexible,                         transoral; diagnostic, including collection of                          specimen(s) by brushing or washing, when performed                         (separate procedure) Diagnosis Code(s):     --- Professional ---                        D50.0, Iron deficiency anemia secondary to blood loss                         (chronic)                        K44.9, Diaphragmatic hernia without obstruction or                         gangrene                        K22.2, Esophageal obstruction CPT copyright 2019 American Medical Association. All rights reserved. The codes documented in this report are preliminary and upon coder review may  be revised to meet current compliance requirements. Efrain Sella MD, MD 03/21/2021 2:04:09 PM This report has been signed electronically. Number of Addenda: 0 Note Initiated On: 03/18/2021 11:40 AM Estimated Blood Loss:  Estimated blood loss: none.      [facility]

## 2021-03-21 NOTE — Anesthesia Preprocedure Evaluation (Addendum)
Anesthesia Evaluation  Patient identified by MRN, date of birth, ID band Patient awake    History of Anesthesia Complications Negative for: history of anesthetic complications  Airway Mallampati: II  TM Distance: >3 FB Neck ROM: Full    Dental no notable dental hx.    Pulmonary neg pulmonary ROS,    Pulmonary exam normal        Cardiovascular Exercise Tolerance: Good + angina Normal cardiovascular exam  Atypical Chest Pain, Neg Cath 2004   Neuro/Psych  Headaches (takes Topamax),  Neuromuscular disease    GI/Hepatic Neg liver ROS, GERD  Medicated and Controlled,  Endo/Other  negative endocrine ROS  Renal/GU negative Renal ROS     Musculoskeletal  (+) Arthritis , Osteoarthritis,    Abdominal   Peds  Hematology negative hematology ROS (+) anemia ,   Anesthesia Other Findings . Allergic rhinitis  . Atypical chest pain  with negative cardiac catheterization 2004.  . Bilateral vitreous detachment  . Fibrocystic breast disease  . GERD (gastroesophageal reflux disease)  . Hyperlipidemia  . Lung nodule  9 mm lung nodule with granulomatous changes and apical scarring on CT 9/13. Stable by CT through 2/16  . Postmenopausal  on HRT  . Skin cancer  face and neck  . Uterine prolapse  with pessary use    Reproductive/Obstetrics                             Anesthesia Physical  Anesthesia Plan  ASA: II  Anesthesia Plan: General   Post-op Pain Management:    Induction: Intravenous  PONV Risk Score and Plan: 2 and TIVA and Propofol infusion  Airway Management Planned: Nasal Cannula and Natural Airway  Additional Equipment: None  Intra-op Plan:   Post-operative Plan:   Informed Consent: I have reviewed the patients History and Physical, chart, labs and discussed the procedure including the risks, benefits and alternatives for the proposed anesthesia with the patient or authorized  representative who has indicated his/her understanding and acceptance.       Plan Discussed with: CRNA, Anesthesiologist and Surgeon  Anesthesia Plan Comments:         Anesthesia Quick Evaluation

## 2021-03-21 NOTE — Transfer of Care (Signed)
Immediate Anesthesia Transfer of Care Note  Patient: Elizabeth Friedman  Procedure(s) Performed: ESOPHAGOGASTRODUODENOSCOPY (EGD) WITH PROPOFOL (N/A )  Patient Location: PACU  Anesthesia Type:MAC  Level of Consciousness: awake, drowsy and patient cooperative  Airway & Oxygen Therapy: Patient Spontanous Breathing  Post-op Assessment: Report given to RN and Post -op Vital signs reviewed and stable  Post vital signs: Reviewed and stable  Last Vitals:  Vitals Value Taken Time  BP 131/69 03/21/21 1400  Temp 36.3 C 03/21/21 1400  Pulse 71 03/21/21 1404  Resp 12 03/21/21 1404  SpO2 98 % 03/21/21 1404  Vitals shown include unvalidated device data.  Last Pain:  Vitals:   03/21/21 1400  TempSrc: Temporal  PainSc:          Complications: No complications documented.

## 2021-03-21 NOTE — Interval H&P Note (Signed)
History and Physical Interval Note:  03/21/2021 1:41 PM  Elizabeth Friedman  has presented today for surgery, with the diagnosis of ACUTE EPIGASTRIC PAIN IDA AVM  ESOPHAGEAL STRICTURE.  The various methods of treatment have been discussed with the patient and family. After consideration of risks, benefits and other options for treatment, the patient has consented to  Procedure(s): ESOPHAGOGASTRODUODENOSCOPY (EGD) WITH PROPOFOL (N/A) as a surgical intervention.  The patient's history has been reviewed, patient examined, no change in status, stable for surgery.  I have reviewed the patient's chart and labs.  Questions were answered to the patient's satisfaction.     [place], [place]

## 2021-03-21 NOTE — Anesthesia Postprocedure Evaluation (Signed)
Anesthesia Post Note  Patient: Elizabeth Friedman  Procedure(s) Performed: ESOPHAGOGASTRODUODENOSCOPY (EGD) WITH PROPOFOL (N/A )  Patient location during evaluation: Phase II Anesthesia Type: General Level of consciousness: awake and alert, awake and oriented Pain management: pain level controlled Vital Signs Assessment: post-procedure vital signs reviewed and stable Respiratory status: spontaneous breathing, nonlabored ventilation and respiratory function stable Cardiovascular status: blood pressure returned to baseline and stable Postop Assessment: no apparent nausea or vomiting Anesthetic complications: no   No complications documented.   Last Vitals:  Vitals:   03/21/21 1410 03/21/21 1420  BP: (!) 143/72 (!) 171/81  Pulse: 70 70  Resp: 14 18  Temp:    SpO2: 98% 100%    Last Pain:  Vitals:   03/21/21 1430  TempSrc:   PainSc: (P) 0-No pain                 Phill Mutter

## 2021-03-21 NOTE — H&P (Signed)
Outpatient short stay form Pre-procedure 03/21/2021 1:35 PM Elizabeth Friedman Elizabeth Friedman, M.D.  Primary Physician: Elizabeth Friedman, M.D.  Reason for visit:  GI blood loss anemia, hx of Gastric AVM's.  History of present illness:  Elizabeth Friedman presents to the [facility] for chief complaint of follow-up of iron-deficiency anemia 2/2 chronic GI blood loss. She is asked to see [place] urgently by her PCP for chief complaint of acute epigastric pain in the setting of worsening iron-deficiency anemia. She had labs on 05/12 recently showing hemoglobin 9.9, low ferritin at 10, and iron 70. Hemoglobin six months ago was 11.5 with ferritin 12. Patient reports she developed acute epigastric pain with worsening indigestion symptoms last Sunday (8 days ago) starting around 6 PM. Pain was centered in her epigastrium and lasted all night and kept her up all night tossing and turning trying to get comfortable. She reports associated symptoms of nausea and indigestion. She reports that she did have strawberry shortcake that night prior to symptom onset. She reports since Sunday her stomach hasn't felt yet. She has continued to have epigastric pain, but is notably less severe as it was last Sunday. The only thing that has been helpful has been a dose of Zantac. She had her omeprazole changed to Protonix 40 mg daily by her PCP last week. She also notes early satiety. She denies any esophageal dysphagia, odynophagia, hoarseness, or unintentional weight loss. She denies any overt hematochezia or melena. Stools are dark because she takes oral iron, but she reports she hasn't smelled blood or noticed any tarry-appearing stools. She denies diarrhea or constipation. At time of her last OV with me last October, she was advised to have video capsule endoscopy to rule out small bowel lesions; however, patient said she did not want to do this.    EGD 08/14/20:  Benign-appearing esophageal stenosis. - Esophageal mucosal variant. Biopsied. - 1 cm  hiatal hernia. - Two bleeding angioectasias in the stomach. Treated with argon plasma coagulation (APC). - Gastritis. - Normal examined duodenum. - The examination was otherwise normal   Current Facility-Administered Medications:  .  0.9 %  sodium chloride infusion, , Intravenous, Continuous, [place], Benay Pike, MD, Last Rate: 20 mL/hr at 03/21/21 1326, New Bag at 03/21/21 1326  Medications Prior to Admission  Medication Sig Dispense Refill Last Dose  . acetaminophen (TYLENOL) 500 MG tablet Take 500 mg by mouth every 6 (six) hours as needed.   03/20/2021 at Unknown time  . aspirin EC 81 MG tablet Take 81 mg by mouth daily.   Past Week at Unknown time  . Calcium Acetate-Magnesium Carb (MAGNEBIND 200 PO) Take by mouth.   03/20/2021 at Unknown time  . calcium carbonate 1250 MG capsule Take 1,250 mg by mouth 2 (two) times daily with a meal.   03/20/2021 at Unknown time  . celecoxib (CELEBREX) 200 MG capsule Take 200 mg by mouth 2 (two) times daily.   03/20/2021 at Unknown time  . cholecalciferol (VITAMIN D) 1000 units tablet Take 1,000 Units by mouth daily.   03/20/2021 at Unknown time  . co-enzyme Q-10 30 MG capsule Take 30 mg by mouth 3 (three) times daily.   03/20/2021 at Unknown time  . gabapentin (NEURONTIN) 100 MG capsule Take 100 mg by mouth 3 (three) times daily.   03/20/2021 at Unknown time  . Omega-3 Fatty Acids (FISH OIL) 1000 MG CAPS Take by mouth.   03/20/2021 at Unknown time  . pantoprazole (PROTONIX) 40 MG tablet Take 40 mg  by mouth daily.     . RESTASIS 0.05 % ophthalmic emulsion    03/20/2021 at Unknown time  . simvastatin (ZOCOR) 10 MG tablet Take 10 mg by mouth daily.   03/20/2021 at Unknown time  . topiramate (TOPAMAX) 50 MG tablet Take 50 mg by mouth daily.   03/20/2021 at Unknown time  . triamcinolone (NASACORT) 55 MCG/ACT AERO nasal inhaler Place 2 sprays into the nose daily.   03/20/2021 at Unknown time  . omeprazole (PRILOSEC) 20 MG capsule Take 20 mg by mouth 2 (two) times daily  before a meal. (Patient not taking: Reported on 03/21/2021)   Not Taking at Unknown time     No Known Allergies   Past Medical History:  Diagnosis Date  . Allergic rhinitis   . Anemia   . Anginal pain ([place])   . Arthritis   . Atypical chest pain   . Breast cancer ([place])   . Cancer ([place])    skin  . Fibrocystic breast disease   . GERD (gastroesophageal reflux disease)   . Hearing aid worn    bilateral  . Hyperlipidemia   . Lung nodule   . Menopause   . Migraine   . Migraine    optical migraines  . Motion sickness   . Plantar fasciitis   . 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 Raynaud's disease   . Sciatic leg pain   . Sciatica   . Urinary retention with incomplete bladder emptying   . Vitreous detachment of both eyes     Review of systems:  Otherwise negative.    Physical Exam  Gen: Alert, oriented. Appears stated age.  HEENT: [place]/AT. PERRLA. Lungs: CTA, no wheezes. CV: RR nl S1, S2. Abd: soft, benign, no masses. BS+ Ext: No edema. Pulses 2+    Planned procedures: Proceed with EGD. The patient understands the nature of the planned procedure, indications, risks, alternatives and potential complications including but not limited to bleeding, infection, perforation, damage to internal organs and possible oversedation/side effects from anesthesia. The patient agrees and gives consent to proceed.  Please refer to procedure notes for findings, recommendations and patient disposition/instructions.     Elizabeth Friedman Elizabeth Friedman, M.D. Gastroenterology 03/21/2021  1:35 PM

## 2021-03-24 ENCOUNTER — Encounter: Payer: Self-pay | Admitting: Internal Medicine

## 2021-04-01 DIAGNOSIS — D500 Iron deficiency anemia secondary to blood loss (chronic): Secondary | ICD-10-CM | POA: Diagnosis not present

## 2021-04-08 DIAGNOSIS — D500 Iron deficiency anemia secondary to blood loss (chronic): Secondary | ICD-10-CM | POA: Diagnosis not present

## 2021-04-15 DIAGNOSIS — R1013 Epigastric pain: Secondary | ICD-10-CM | POA: Diagnosis not present

## 2021-04-15 DIAGNOSIS — D500 Iron deficiency anemia secondary to blood loss (chronic): Secondary | ICD-10-CM | POA: Diagnosis not present

## 2021-04-15 DIAGNOSIS — E785 Hyperlipidemia, unspecified: Secondary | ICD-10-CM | POA: Diagnosis not present

## 2021-04-18 DIAGNOSIS — I6523 Occlusion and stenosis of bilateral carotid arteries: Secondary | ICD-10-CM | POA: Diagnosis not present

## 2021-04-18 DIAGNOSIS — J8410 Pulmonary fibrosis, unspecified: Secondary | ICD-10-CM | POA: Diagnosis not present

## 2021-04-18 DIAGNOSIS — E785 Hyperlipidemia, unspecified: Secondary | ICD-10-CM | POA: Diagnosis not present

## 2021-04-18 DIAGNOSIS — Z86000 Personal history of in-situ neoplasm of breast: Secondary | ICD-10-CM | POA: Diagnosis not present

## 2021-04-18 DIAGNOSIS — D500 Iron deficiency anemia secondary to blood loss (chronic): Secondary | ICD-10-CM | POA: Diagnosis not present

## 2021-04-18 DIAGNOSIS — G629 Polyneuropathy, unspecified: Secondary | ICD-10-CM | POA: Diagnosis not present

## 2021-04-18 DIAGNOSIS — K219 Gastro-esophageal reflux disease without esophagitis: Secondary | ICD-10-CM | POA: Diagnosis not present

## 2021-04-18 DIAGNOSIS — R202 Paresthesia of skin: Secondary | ICD-10-CM | POA: Diagnosis not present

## 2021-05-02 DIAGNOSIS — K31811 Angiodysplasia of stomach and duodenum with bleeding: Secondary | ICD-10-CM | POA: Diagnosis not present

## 2021-05-02 DIAGNOSIS — D500 Iron deficiency anemia secondary to blood loss (chronic): Secondary | ICD-10-CM | POA: Diagnosis not present

## 2021-05-02 DIAGNOSIS — K219 Gastro-esophageal reflux disease without esophagitis: Secondary | ICD-10-CM | POA: Diagnosis not present

## 2021-05-02 DIAGNOSIS — K30 Functional dyspepsia: Secondary | ICD-10-CM | POA: Diagnosis not present

## 2021-05-02 DIAGNOSIS — K222 Esophageal obstruction: Secondary | ICD-10-CM | POA: Diagnosis not present

## 2021-05-09 DIAGNOSIS — H04123 Dry eye syndrome of bilateral lacrimal glands: Secondary | ICD-10-CM | POA: Diagnosis not present

## 2021-06-09 DIAGNOSIS — J3489 Other specified disorders of nose and nasal sinuses: Secondary | ICD-10-CM | POA: Diagnosis not present

## 2021-06-09 DIAGNOSIS — J310 Chronic rhinitis: Secondary | ICD-10-CM | POA: Diagnosis not present

## 2021-06-16 DIAGNOSIS — J342 Deviated nasal septum: Secondary | ICD-10-CM | POA: Diagnosis not present

## 2021-06-20 DIAGNOSIS — J3489 Other specified disorders of nose and nasal sinuses: Secondary | ICD-10-CM | POA: Diagnosis not present

## 2021-06-20 DIAGNOSIS — J301 Allergic rhinitis due to pollen: Secondary | ICD-10-CM | POA: Diagnosis not present

## 2021-06-20 DIAGNOSIS — J342 Deviated nasal septum: Secondary | ICD-10-CM | POA: Diagnosis not present

## 2021-07-07 DIAGNOSIS — J301 Allergic rhinitis due to pollen: Secondary | ICD-10-CM | POA: Diagnosis not present

## 2021-08-15 DIAGNOSIS — H2513 Age-related nuclear cataract, bilateral: Secondary | ICD-10-CM | POA: Diagnosis not present

## 2021-08-21 ENCOUNTER — Encounter: Payer: Self-pay | Admitting: Ophthalmology

## 2021-09-01 NOTE — Discharge Instructions (Signed)

## 2021-09-03 ENCOUNTER — Ambulatory Visit: Payer: PPO | Admitting: Anesthesiology

## 2021-09-03 ENCOUNTER — Other Ambulatory Visit: Payer: Self-pay

## 2021-09-03 ENCOUNTER — Encounter: Payer: Self-pay | Admitting: Ophthalmology

## 2021-09-03 ENCOUNTER — Ambulatory Visit
Admission: RE | Admit: 2021-09-03 | Discharge: 2021-09-03 | Disposition: A | Discharge status: Home / Self Care | Payer: PPO | Source: Ambulatory Visit | Attending: Ophthalmology | Admitting: Ophthalmology

## 2021-09-03 ENCOUNTER — Encounter
Admission: RE | Disposition: A | Discharge status: Home / Self Care | Payer: Self-pay | Source: Ambulatory Visit | Attending: Ophthalmology

## 2021-09-03 DIAGNOSIS — H25812 Combined forms of age-related cataract, left eye: Secondary | ICD-10-CM | POA: Diagnosis not present

## 2021-09-03 DIAGNOSIS — H2512 Age-related nuclear cataract, left eye: Secondary | ICD-10-CM | POA: Insufficient documentation

## 2021-09-03 HISTORY — PX: CATARACT EXTRACTION W/PHACO: SHX586

## 2021-09-03 SURGERY — PHACOEMULSIFICATION, CATARACT, WITH IOL INSERTION
Anesthesia: Monitor Anesthesia Care | Site: Eye | Laterality: Left

## 2021-09-03 MED ORDER — SIGHTPATH DOSE#1 BSS IO SOLN
INTRAOCULAR | Status: DC | PRN
  Administered 2021-09-03: 76 mL via OPHTHALMIC

## 2021-09-03 MED ORDER — FENTANYL CITRATE (PF) 100 MCG/2ML IJ SOLN
INTRAMUSCULAR | Status: DC | PRN
  Administered 2021-09-03: 50 ug via INTRAVENOUS

## 2021-09-03 MED ORDER — MIDAZOLAM HCL 2 MG/2ML IJ SOLN
INTRAMUSCULAR | Status: DC | PRN
  Administered 2021-09-03 (×2): 1 mg via INTRAVENOUS

## 2021-09-03 MED ORDER — LACTATED RINGERS IV SOLN: INTRAVENOUS | Status: DC

## 2021-09-03 MED ORDER — SIGHTPATH DOSE#1 BSS IO SOLN
INTRAOCULAR | Status: DC | PRN
  Administered 2021-09-03: 1 mL via INTRAMUSCULAR

## 2021-09-03 MED ORDER — SIGHTPATH DOSE#1 NA HYALUR & NA CHOND-NA HYALUR IO KIT
PACK | INTRAOCULAR | Status: DC | PRN
  Administered 2021-09-03: 1 via OPHTHALMIC

## 2021-09-03 MED ORDER — ARMC OPHTHALMIC DILATING DROPS
1.0000 "application " | OPHTHALMIC | Status: DC | PRN
  Administered 2021-09-03 (×3): 1 via OPHTHALMIC

## 2021-09-03 MED ORDER — MOXIFLOXACIN HCL 0.5 % OP SOLN
OPHTHALMIC | Status: DC | PRN
  Administered 2021-09-03: 0.2 mL via OPHTHALMIC

## 2021-09-03 MED ORDER — BRIMONIDINE TARTRATE-TIMOLOL 0.2-0.5 % OP SOLN
OPHTHALMIC | Status: DC | PRN
  Administered 2021-09-03: 1 [drp] via OPHTHALMIC

## 2021-09-03 MED ORDER — SIGHTPATH DOSE#1 BSS IO SOLN
INTRAOCULAR | Status: DC | PRN
  Administered 2021-09-03: 15 mL

## 2021-09-03 MED ORDER — CEFUROXIME OPHTHALMIC INJECTION 1 MG/0.1 ML
INJECTION | OPHTHALMIC | Status: DC | PRN
  Administered 2021-09-03: 0.1 mL via OPHTHALMIC

## 2021-09-03 MED ORDER — TETRACAINE HCL 0.5 % OP SOLN
1.0000 [drp] | OPHTHALMIC | Status: DC | PRN
  Administered 2021-09-03 (×3): 1 [drp] via OPHTHALMIC

## 2021-09-03 MED ORDER — NEOMYCIN-POLYMYXIN-DEXAMETH 3.5-10000-0.1 OP OINT
TOPICAL_OINTMENT | OPHTHALMIC | Status: DC | PRN
  Administered 2021-09-03: 1 via OPHTHALMIC

## 2021-09-03 SURGICAL SUPPLY — 14 items
CANNULA ANT/CHMB 27GA (MISCELLANEOUS) ×2 IMPLANT
GLOVE SRG 8 PF TXTR STRL LF DI (GLOVE) ×1 IMPLANT
GLOVE SURG ENC TEXT LTX SZ7.5 (GLOVE) ×2 IMPLANT
GLOVE SURG UNDER POLY LF SZ8 (GLOVE) ×2
GOWN STRL REUS W/ TWL LRG LVL3 (GOWN DISPOSABLE) ×2 IMPLANT
GOWN STRL REUS W/TWL LRG LVL3 (GOWN DISPOSABLE) ×4
LENS IOL TECNIS EYHANCE 21.5 (Intraocular Lens) ×2 IMPLANT
MARKER SKIN DUAL TIP RULER LAB (MISCELLANEOUS) ×2 IMPLANT
NEEDLE FILTER BLUNT 18X 1/2SAF (NEEDLE) ×2
NEEDLE FILTER BLUNT 18X1 1/2 (NEEDLE) ×2 IMPLANT
SYR 3ML LL SCALE MARK (SYRINGE) ×4 IMPLANT
SYR TB 1ML LUER SLIP (SYRINGE) ×2 IMPLANT
WATER STERILE IRR 250ML POUR (IV SOLUTION) ×2 IMPLANT
WIPE NON LINTING 3.25X3.25 (MISCELLANEOUS) ×2 IMPLANT

## 2021-09-03 NOTE — Anesthesia Procedure Notes (Signed)
Procedure Name: MAC Date/Time: 09/03/2021 8:21 AM Performed by: Jeannene Patella, CRNA Pre-anesthesia Checklist: Patient identified, Emergency Drugs available, Suction available, Timeout performed and Patient being monitored Patient Re-evaluated:Patient Re-evaluated prior to induction Oxygen Delivery Method: Nasal cannula Placement Confirmation: positive ETCO2

## 2021-09-03 NOTE — Op Note (Signed)
  OPERATIVE NOTE  Elizabeth Friedman [id number] 09/03/2021   PREOPERATIVE DIAGNOSIS:  Nuclear sclerotic cataract left eye. H25.12   POSTOPERATIVE DIAGNOSIS:    Nuclear sclerotic cataract left eye.     PROCEDURE:  Phacoemusification with posterior chamber intraocular lens placement of the left eye  Ultrasound time: Procedure(s) with comments: CATARACT EXTRACTION PHACO AND INTRAOCULAR LENS PLACEMENT (IOC) LEFT (Left) - 9.61 01:15.9  LENS:   Implant Name Type Inv. Item Serial No. Manufacturer Lot No. LRB No. Used Action  LENS IOL TECNIS EYHANCE 21.5 - [id number] Intraocular Lens LENS IOL TECNIS EYHANCE 21.5 [id number] JOHNSON   Left 1 Implanted      SURGEON:  Wyonia Hough, MD   ANESTHESIA:  Topical with tetracaine drops and 2% Xylocaine jelly, augmented with 1% preservative-free intracameral lidocaine.    COMPLICATIONS:  None.   DESCRIPTION OF PROCEDURE:  The patient was identified in the holding room and transported to the operating room and placed in the supine position under the operating microscope.  The left eye was identified as the operative eye and it was prepped and draped in the usual sterile ophthalmic fashion.   A 1 millimeter clear-corneal paracentesis was made at the 1:30 position.  0.5 ml of preservative-free 1% lidocaine was injected into the anterior chamber.  The anterior chamber was filled with Viscoat viscoelastic.  A 2.4 millimeter keratome was used to make a near-clear corneal incision at the 10:30 position.  .  A curvilinear capsulorrhexis was made with a cystotome and capsulorrhexis forceps.  Balanced salt solution was used to hydrodissect and hydrodelineate the nucleus.   Phacoemulsification was then used in stop and chop fashion to remove the lens nucleus and epinucleus.  The remaining cortex was then removed using the irrigation and aspiration handpiece. Provisc was then placed into the capsular bag to distend it for lens placement.  A lens was then  injected into the capsular bag.  The remaining viscoelastic was aspirated.   Wounds were hydrated with balanced salt solution.  The anterior chamber was inflated to a physiologic pressure with balanced salt solution.  No wound leaks were noted. Cefuroxime 0.1 ml of a 10mg /ml solution was injected into the anterior chamber for a dose of 1 mg of intracameral antibiotic at the completion of the case. Vigamox,  Timolol and Brimonidine drops plus maxitrol ointment were applied to the eye.  The patient was taken to the recovery room in stable condition without complications of anesthesia or surgery.  Elizabeth Friedman 09/03/2021, 8:38 AM

## 2021-09-03 NOTE — H&P (Signed)
[facility]   Primary Care Physician:  Adin Hector, MD Ophthalmologist: Dr. Leandrew Koyanagi  Pre-Procedure History & Physical: HPI:  Elizabeth Friedman is a 80 y.o. female here for ophthalmic surgery.   Past Medical History:  Diagnosis Date   Allergic rhinitis    Anemia    Anginal pain (H[facility])    Arthritis    Atypical chest pain    Breast cancer ([place])    Cancer ([place])    skin   Fibrocystic breast disease    GERD (gastroesophageal reflux disease)    Hearing aid worn    bilateral   Hyperlipidemia    Lung nodule    Menopause    Migraine    Migraine    optical migraines   Motion sickness    Plantar fasciitis    Prolapse of female pelvic organs    hodge pessary   Raynaud's disease    Sciatic leg pain    Sciatica    Urinary retention with incomplete bladder emptying    Vitreous detachment of both eyes     Past Surgical History:  Procedure Laterality Date   APPENDECTOMY     BREAST LUMPECTOMY WITH RADIOACTIVE SEED LOCALIZATION Right 05/29/2020   Procedure: RIGHT BREAST LUMPECTOMY WITH RADIOACTIVE SEED LOCALIZATION;  Surgeon: Rolm Bookbinder, MD;  Location: [facility];  Service: General;  Laterality: Right;   CARDIAC CATHETERIZATION     CATARACT EXTRACTION W/PHACO Right 02/26/2021   Procedure: CATARACT EXTRACTION PHACO AND INTRAOCULAR LENS PLACEMENT (IOC) RIGHT 5.86 01:19.6 7.4%;  Surgeon: Leandrew Koyanagi, MD;  Location: [facility];  Service: Ophthalmology;  Laterality: Right;   COLONOSCOPY WITH PROPOFOL N/A 03/27/2016   Procedure: COLONOSCOPY WITH PROPOFOL;  Surgeon: Manya Silvas, MD;  Location: [facility] ENDOSCOPY;  Service: Endoscopy;  Laterality: N/A;   COLONOSCOPY WITH PROPOFOL N/A 08/14/2020   Procedure: COLONOSCOPY WITH PROPOFOL;  Surgeon: [place], Benay Pike, MD;  Location: [facility] ENDOSCOPY;  Service: Gastroenterology;  Laterality: N/A;   ESOPHAGOGASTRODUODENOSCOPY (EGD) WITH PROPOFOL N/A 08/14/2020   Procedure:  ESOPHAGOGASTRODUODENOSCOPY (EGD) WITH PROPOFOL;  Surgeon: [place], Benay Pike, MD;  Location: [facility] ENDOSCOPY;  Service: Gastroenterology;  Laterality: N/A;   ESOPHAGOGASTRODUODENOSCOPY (EGD) WITH PROPOFOL N/A 03/21/2021   Procedure: ESOPHAGOGASTRODUODENOSCOPY (EGD) WITH PROPOFOL;  Surgeon: [place], Benay Pike, MD;  Location: [facility] ENDOSCOPY;  Service: Gastroenterology;  Laterality: N/A;   SKIN CANCER EXCISION     face and neck    Prior to Admission medications   Medication Sig Start Date End Date Taking? Authorizing Provider  acetaminophen (TYLENOL) 500 MG tablet Take 500 mg by mouth every 6 (six) hours as needed.   Yes [provider]  Calcium Acetate-Magnesium Carb (MAGNEBIND 200 PO) Take by mouth.   Yes [provider]  celecoxib (CELEBREX) 200 MG capsule Take 200 mg by mouth 2 (two) times daily.   Yes [provider]  cholecalciferol (VITAMIN D) 1000 units tablet Take 1,000 Units by mouth daily.   Yes [provider]  co-enzyme Q-10 30 MG capsule Take 30 mg by mouth 3 (three) times daily.   Yes [provider]  gabapentin (NEURONTIN) 100 MG capsule Take 100 mg by mouth 3 (three) times daily.   Yes [provider]  Multiple Vitamins-Minerals (VITRUM PO) Take by mouth.   Yes [provider]  Omega-3 Fatty Acids (FISH OIL) 1000 MG CAPS Take by mouth.   Yes [provider]  pantoprazole (PROTONIX) 40 MG tablet Take 40 mg by mouth daily.   Yes  [provider]  RESTASIS 0.05 % ophthalmic emulsion  10/24/15  Yes [provider]  simvastatin (ZOCOR) 10 MG tablet Take 10 mg by mouth daily.   Yes [provider]  topiramate (TOPAMAX) 50 MG tablet Take 50 mg by mouth daily. 12/22/19  Yes [provider]  triamcinolone (NASACORT) 55 MCG/ACT AERO nasal inhaler Place 2 sprays into the nose daily.   Yes [provider]    Allergies as of 08/20/2021   (No Known Allergies)    Family History   Problem Relation Age of Onset   Breast cancer Mother    Heart disease Father    Colon cancer Brother    Diabetes Neg Hx    Ovarian cancer Neg Hx     Social History   Socioeconomic History   Marital status: Widowed    Spouse name: Not on file   Number of children: Not on file   Years of education: Not on file   Highest education level: Not on file  Occupational History   Not on file  Tobacco Use   Smoking status: Never   Smokeless tobacco: Never  Vaping Use   Vaping Use: Never used  Substance and Sexual Activity   Alcohol use: No   Drug use: No   Sexual activity: Not Currently    Birth control/protection: Post-menopausal  Other Topics Concern   Not on file  Social History Narrative   Not on file   Social Determinants of Health   Financial Resource Strain: Not on file  Food Insecurity: Not on file  Transportation Needs: Not on file  Physical Activity: Not on file  Stress: Not on file  Social Connections: Not on file  Intimate Partner Violence: Not on file    Review of Systems: See HPI, otherwise negative ROS  Physical Exam: BP (!) 170/86   Pulse 72   Temp 97.8 F (36.6 C)   Wt 47.6 kg   SpO2 100%   BMI 19.20 kg/m  General:   Alert,  pleasant and cooperative in NAD Head:  Normocephalic and atraumatic. Lungs:  Clear to auscultation.    Heart:  Regular rate and rhythm.   Impression/Plan: Elizabeth Friedman is here for ophthalmic surgery.  Risks, benefits, limitations, and alternatives regarding ophthalmic surgery have been reviewed with the patient.  Questions have been answered.  All parties agreeable.   Leandrew Koyanagi, MD  09/03/2021, 7:36 AM

## 2021-09-03 NOTE — Anesthesia Postprocedure Evaluation (Signed)
Anesthesia Post Note  Patient: Elizabeth Friedman  Procedure(s) Performed: CATARACT EXTRACTION PHACO AND INTRAOCULAR LENS PLACEMENT (IOC) LEFT (Left: Eye)     Patient location during evaluation: PACU Anesthesia Type: MAC Level of consciousness: awake and alert Pain management: pain level controlled Vital Signs Assessment: post-procedure vital signs reviewed and stable Respiratory status: spontaneous breathing Cardiovascular status: stable Anesthetic complications: no   No notable events documented.  Gillian Scarce

## 2021-09-03 NOTE — Transfer of Care (Signed)
Immediate Anesthesia Transfer of Care Note  Patient: Elizabeth Friedman  Procedure(s) Performed: CATARACT EXTRACTION PHACO AND INTRAOCULAR LENS PLACEMENT (IOC) LEFT (Left: Eye)  Patient Location: PACU  Anesthesia Type: MAC  Level of Consciousness: awake, alert  and patient cooperative  Airway and Oxygen Therapy: Patient Spontanous Breathing and Patient connected to supplemental oxygen  Post-op Assessment: Post-op Vital signs reviewed, Patient's Cardiovascular Status Stable, Respiratory Function Stable, Patent Airway and No signs of Nausea or vomiting  Post-op Vital Signs: Reviewed and stable  Complications: No notable events documented.

## 2021-09-03 NOTE — Anesthesia Preprocedure Evaluation (Signed)
Anesthesia Evaluation  Patient identified by MRN, date of birth, ID band Patient awake    Reviewed: Allergy & Precautions, H&P , NPO status , Patient's Chart, lab work & pertinent test results  Airway Mallampati: II  TM Distance: >3 FB Neck ROM: full    Dental no notable dental hx.    Pulmonary neg pulmonary ROS,    Pulmonary exam normal        Cardiovascular negative cardio ROS Normal cardiovascular exam Rhythm:regular Rate:Normal     Neuro/Psych  Headaches,    GI/Hepatic Neg liver ROS, Medicated,HLD   Endo/Other  negative endocrine ROS  Renal/GU negative Renal ROS     Musculoskeletal   Abdominal   Peds  Hematology  (+) Blood dyscrasia, anemia ,   Anesthesia Other Findings   Reproductive/Obstetrics                             Anesthesia Physical Anesthesia Plan  ASA: 2  Anesthesia Plan: MAC   Post-op Pain Management:    Induction:   PONV Risk Score and Plan: 2 and TIVA, Midazolam and Treatment may vary due to age or medical condition  Airway Management Planned:   Additional Equipment:   Intra-op Plan:   Post-operative Plan:   Informed Consent: I have reviewed the patients History and Physical, chart, labs and discussed the procedure including the risks, benefits and alternatives for the proposed anesthesia with the patient or authorized representative who has indicated his/her understanding and acceptance.       Plan Discussed with:   Anesthesia Plan Comments:         Anesthesia Quick Evaluation

## 2021-09-04 ENCOUNTER — Encounter: Payer: Self-pay | Admitting: Ophthalmology

## 2021-09-22 DIAGNOSIS — K219 Gastro-esophageal reflux disease without esophagitis: Secondary | ICD-10-CM | POA: Diagnosis not present

## 2021-09-22 DIAGNOSIS — Z86000 Personal history of in-situ neoplasm of breast: Secondary | ICD-10-CM | POA: Diagnosis not present

## 2021-09-22 DIAGNOSIS — R202 Paresthesia of skin: Secondary | ICD-10-CM | POA: Diagnosis not present

## 2021-09-22 DIAGNOSIS — D500 Iron deficiency anemia secondary to blood loss (chronic): Secondary | ICD-10-CM | POA: Diagnosis not present

## 2021-09-22 DIAGNOSIS — I6523 Occlusion and stenosis of bilateral carotid arteries: Secondary | ICD-10-CM | POA: Diagnosis not present

## 2021-09-22 DIAGNOSIS — G629 Polyneuropathy, unspecified: Secondary | ICD-10-CM | POA: Diagnosis not present

## 2021-09-22 DIAGNOSIS — E785 Hyperlipidemia, unspecified: Secondary | ICD-10-CM | POA: Diagnosis not present

## 2021-09-25 DIAGNOSIS — J8410 Pulmonary fibrosis, unspecified: Secondary | ICD-10-CM | POA: Diagnosis not present

## 2021-09-25 DIAGNOSIS — E785 Hyperlipidemia, unspecified: Secondary | ICD-10-CM | POA: Diagnosis not present

## 2021-09-25 DIAGNOSIS — K219 Gastro-esophageal reflux disease without esophagitis: Secondary | ICD-10-CM | POA: Diagnosis not present

## 2021-09-25 DIAGNOSIS — E538 Deficiency of other specified B group vitamins: Secondary | ICD-10-CM | POA: Insufficient documentation

## 2021-09-25 DIAGNOSIS — M5430 Sciatica, unspecified side: Secondary | ICD-10-CM | POA: Diagnosis not present

## 2021-09-25 DIAGNOSIS — I6523 Occlusion and stenosis of bilateral carotid arteries: Secondary | ICD-10-CM | POA: Diagnosis not present

## 2021-09-25 DIAGNOSIS — Z0000 Encounter for general adult medical examination without abnormal findings: Secondary | ICD-10-CM | POA: Diagnosis not present

## 2021-09-25 DIAGNOSIS — D500 Iron deficiency anemia secondary to blood loss (chronic): Secondary | ICD-10-CM | POA: Diagnosis not present

## 2021-09-25 DIAGNOSIS — Z86000 Personal history of in-situ neoplasm of breast: Secondary | ICD-10-CM | POA: Diagnosis not present

## 2021-09-25 IMAGING — US US CAROTID DUPLEX BILAT
1 series · 13 of 24 positions shown · non-contrast
Comparison: None.

CLINICAL DATA: Amaurosis fugax

EXAM:
BILATERAL CAROTID DUPLEX ULTRASOUND
TECHNIQUE: Gray scale imaging, color Doppler and duplex ultrasound were
performed of bilateral carotid and vertebral arteries in the neck.

[Series 1: us carotid duplex bilat · 0.05mm/px · 13 of 63 slices shown]
[im 1/63]
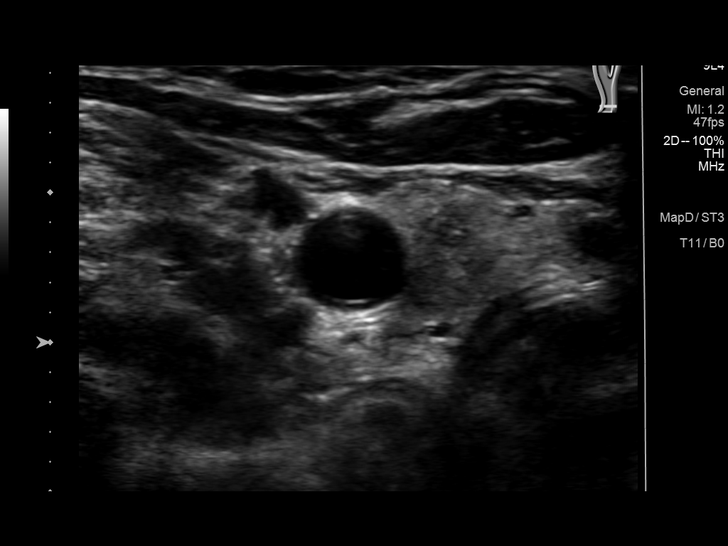
[im 6/63]
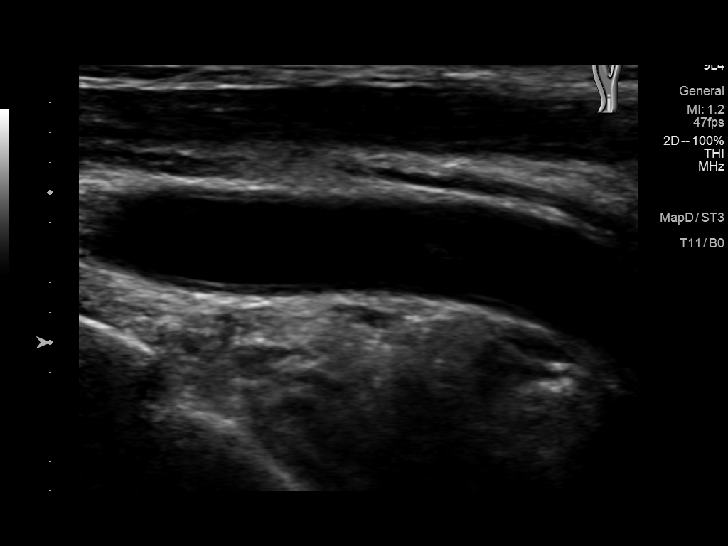
[im 11/63]
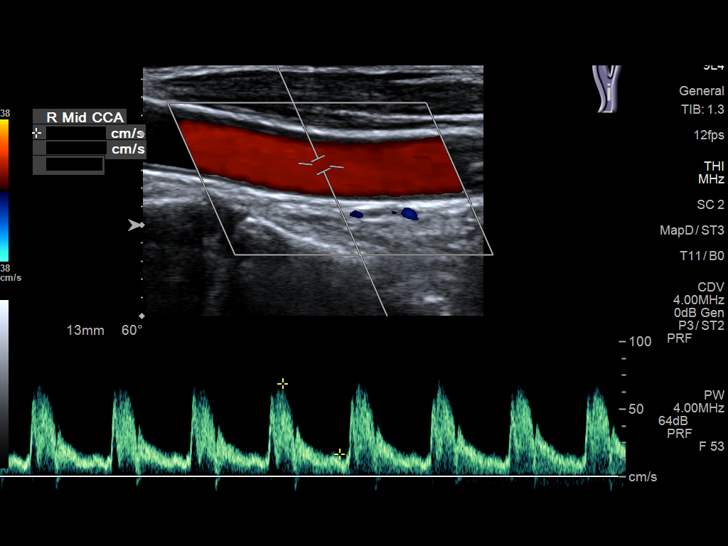
[im 17/63]
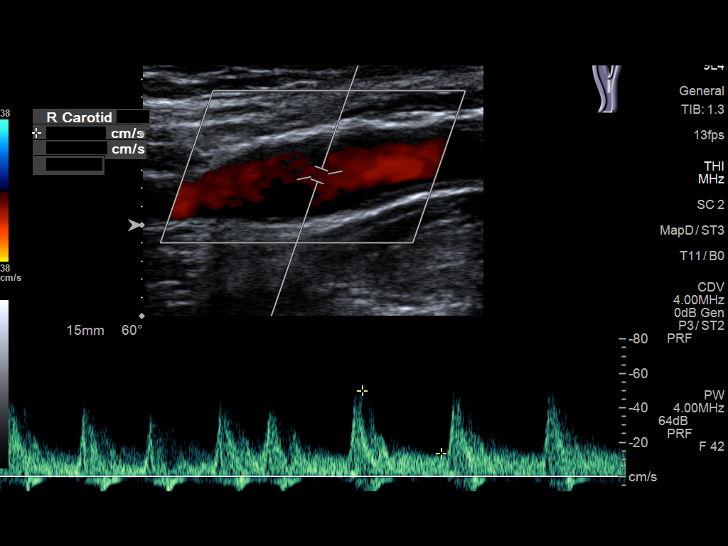
[im 22/63]
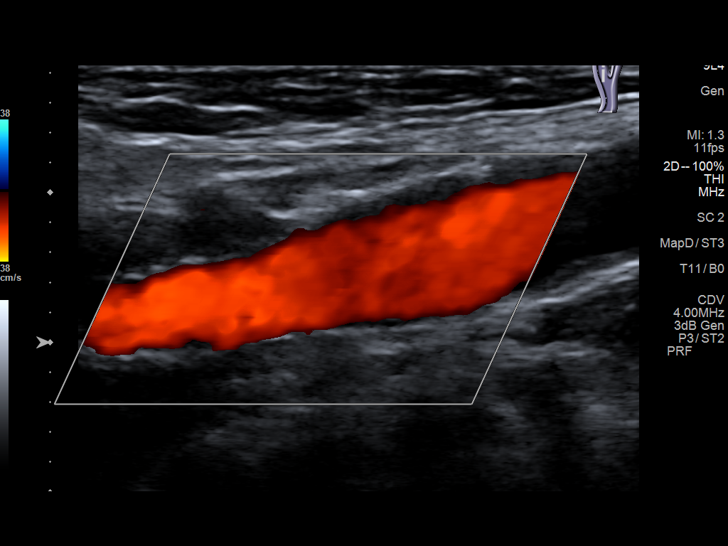
[im 27/63]
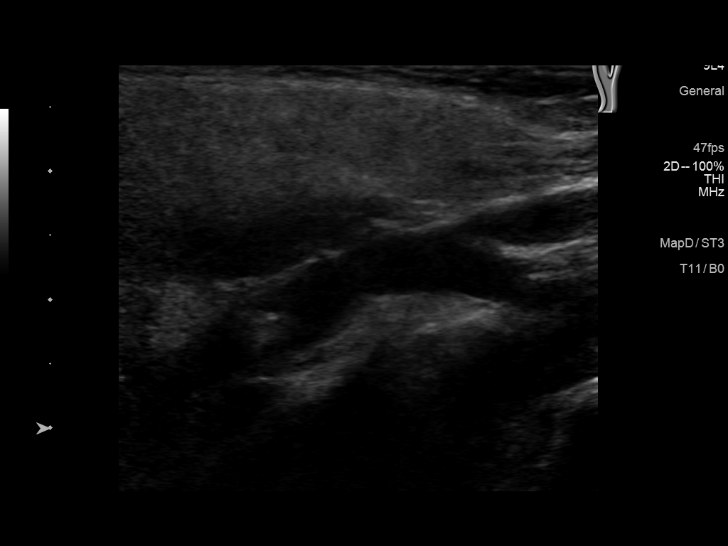
[im 33/63]
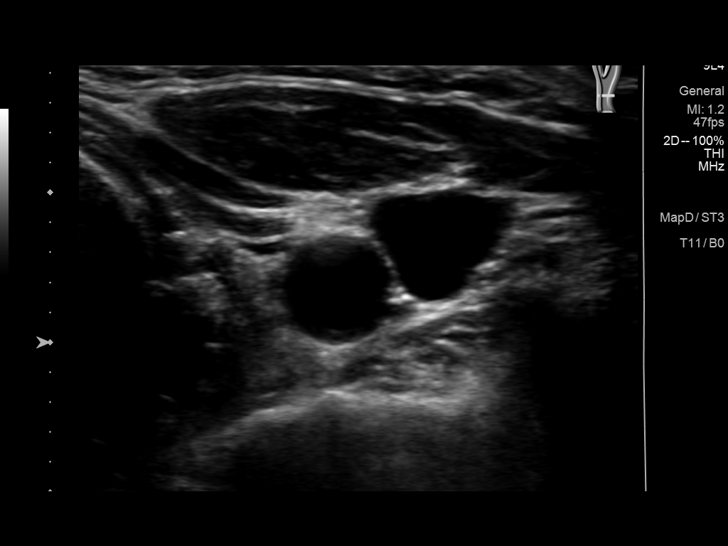
[im 36/63]
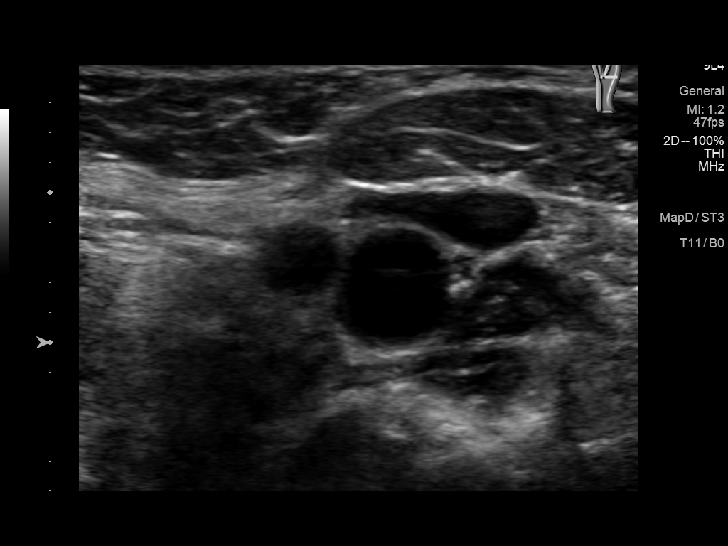
[im 41/63]
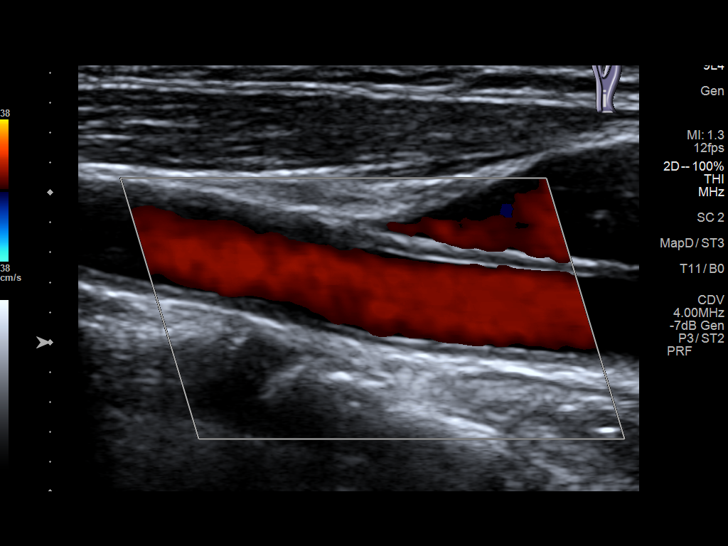
[im 46/63]
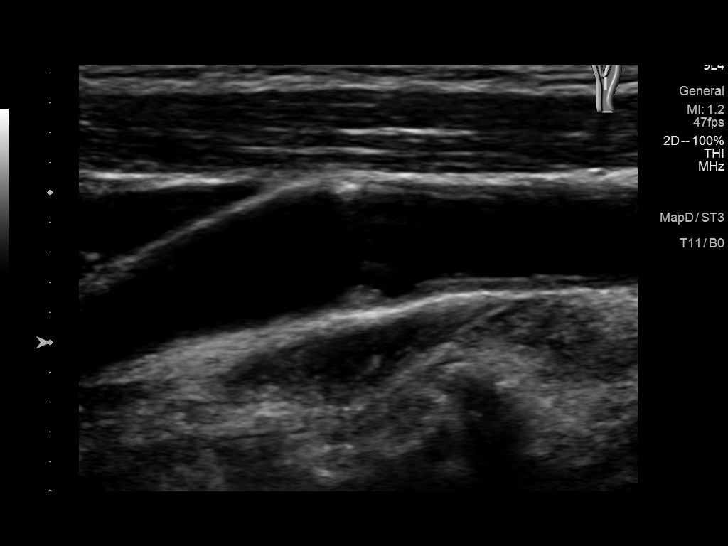
[im 52/63]
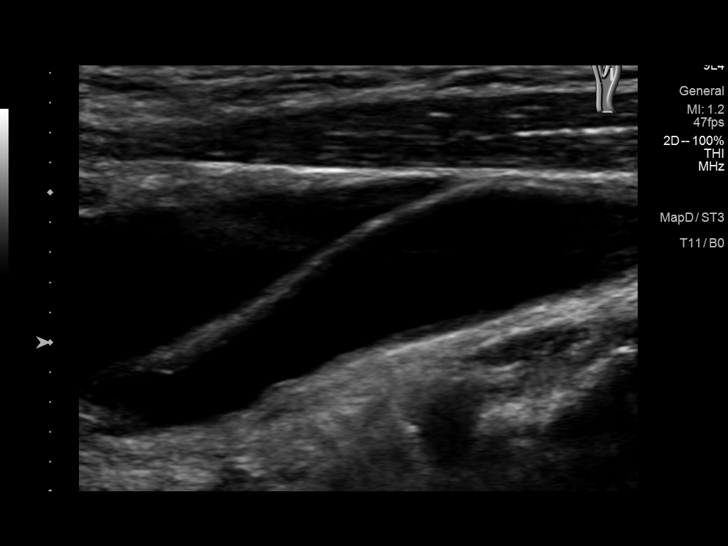
[im 57/63]
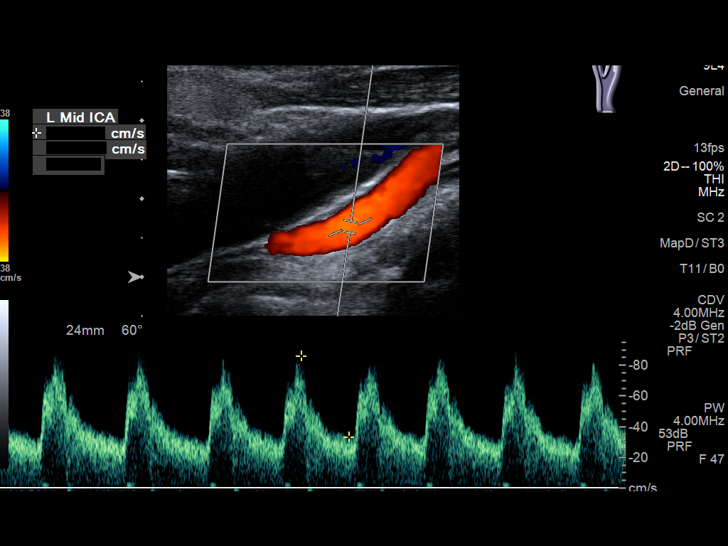
[im 63/63]
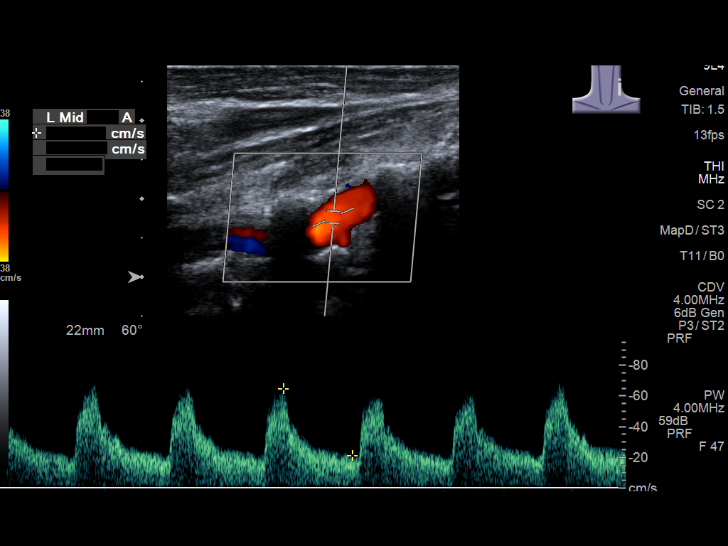

[13 of 24 positions shown; findings below may reference images not displayed]

FINDINGS: Criteria: Quantification of carotid stenosis is based on velocity
parameters that correlate the residual internal carotid diameter
with NASCET-based stenosis levels, using the diameter of the distal
internal carotid lumen as the denominator for stenosis measurement.

The following velocity measurements were obtained:

RIGHT
ICA: 88/36 cm/sec
CCA: 69/17 cm/sec

SYSTOLIC ICA/CCA RATIO:

ECA:  54 cm/sec

LEFT

ICA: 110/40 cm/sec

CCA: 76/23 cm/sec

SYSTOLIC ICA/CCA RATIO:

ECA:  50 cm/sec

RIGHT CAROTID ARTERY: No significant atherosclerotic plaque or
evidence of stenosis in the internal carotid artery.

RIGHT VERTEBRAL ARTERY:  Patent with normal antegrade flow.

LEFT CAROTID ARTERY: Trace heterogeneous atherosclerotic plaque in
the proximal internal carotid artery. By peak systolic velocity
criteria, the estimated stenosis is less than 50%.

LEFT VERTEBRAL ARTERY:  Patent with normal antegrade flow.
IMPRESSION: 1. Mild (1-49%) stenosis proximal left internal carotid artery
secondary to trace focal heterogeneous atherosclerotic plaque.
2. No significant atherosclerotic plaque or evidence of stenosis in
the right internal carotid artery.
3. The vertebral arteries are patent with normal antegrade flow.

## 2021-11-03 ENCOUNTER — Telehealth: Payer: Self-pay | Admitting: Obstetrics and Gynecology

## 2021-11-03 NOTE — Telephone Encounter (Signed)
I know. The meeting does not start until 2, and we are moving our high risk meetings to Wednesdays instead of Tuesdays.

## 2021-11-03 NOTE — Telephone Encounter (Signed)
I can see her tomorrow at 1:30.

## 2021-11-03 NOTE — Telephone Encounter (Signed)
Pt called stating that she is having severe pelvic pain that is worsening over the past month. Pt states that tylenol has helped but pain is "incapacitating" 6 out of 10 on pain scale. Pt is concerned that this is a pessary issues or cyst. Pt last seen 12/2021. Please advise.

## 2021-11-04 NOTE — Telephone Encounter (Signed)
Ok.  Thanks. I guess we can try calling her again today, and if you still can't reach her, try a Mychart message. Otherwise just document the call that says that we tried to reach her.

## 2021-11-05 ENCOUNTER — Telehealth: Payer: Self-pay | Admitting: Obstetrics and Gynecology

## 2021-11-05 DIAGNOSIS — D2262 Melanocytic nevi of left upper limb, including shoulder: Secondary | ICD-10-CM | POA: Diagnosis not present

## 2021-11-05 DIAGNOSIS — T887XXA Unspecified adverse effect of drug or medicament, initial encounter: Secondary | ICD-10-CM | POA: Diagnosis not present

## 2021-11-05 DIAGNOSIS — D2261 Melanocytic nevi of right upper limb, including shoulder: Secondary | ICD-10-CM | POA: Diagnosis not present

## 2021-11-05 DIAGNOSIS — D2271 Melanocytic nevi of right lower limb, including hip: Secondary | ICD-10-CM | POA: Diagnosis not present

## 2021-11-05 DIAGNOSIS — X32XXXA Exposure to sunlight, initial encounter: Secondary | ICD-10-CM | POA: Diagnosis not present

## 2021-11-05 DIAGNOSIS — L570 Actinic keratosis: Secondary | ICD-10-CM | POA: Diagnosis not present

## 2021-11-05 DIAGNOSIS — D2272 Melanocytic nevi of left lower limb, including hip: Secondary | ICD-10-CM | POA: Diagnosis not present

## 2021-11-05 DIAGNOSIS — L821 Other seborrheic keratosis: Secondary | ICD-10-CM | POA: Diagnosis not present

## 2021-11-05 DIAGNOSIS — D225 Melanocytic nevi of trunk: Secondary | ICD-10-CM | POA: Diagnosis not present

## 2021-11-05 NOTE — Telephone Encounter (Signed)
Attempted to call pt back to get pt scheduled with Dr.Cherry for pelvic pain, called on 1/9 to try to work her in 1/10 @ 1:30 per Dr.Cherry no answer. I called left message 1/11 @ 1034 to call back so we can try to work in.

## 2021-11-05 NOTE — Telephone Encounter (Signed)
Just let me know if you are able to reach her, as anywhere you place her would likely be a work-in spot.

## 2021-11-05 NOTE — Telephone Encounter (Signed)
Called again today, left message to call office and update on pelvic pain.

## 2021-11-11 ENCOUNTER — Other Ambulatory Visit: Payer: Self-pay

## 2021-11-11 ENCOUNTER — Encounter: Payer: Self-pay | Admitting: Obstetrics and Gynecology

## 2021-11-11 ENCOUNTER — Ambulatory Visit: Payer: PPO | Admitting: Obstetrics and Gynecology

## 2021-11-11 VITALS — BP 167/86 | HR 78 | Ht 62.0 in | Wt 106.0 lb

## 2021-11-11 DIAGNOSIS — Z960 Presence of urogenital implants: Secondary | ICD-10-CM | POA: Diagnosis not present

## 2021-11-11 DIAGNOSIS — N8110 Cystocele, unspecified: Secondary | ICD-10-CM

## 2021-11-11 DIAGNOSIS — R102 Pelvic and perineal pain: Secondary | ICD-10-CM | POA: Diagnosis not present

## 2021-11-11 DIAGNOSIS — N949 Unspecified condition associated with female genital organs and menstrual cycle: Secondary | ICD-10-CM

## 2021-11-11 DIAGNOSIS — N816 Rectocele: Secondary | ICD-10-CM | POA: Diagnosis not present

## 2021-11-11 NOTE — Progress Notes (Signed)
° ° °  GYNECOLOGY PROGRESS NOTE  Subjective:    Patient ID: Elizabeth Friedman, female    DOB: 1941-10-14, 81 y.o.   MRN: [id number]  HPI  Patient is a 81 y.o. G10P2002 female who presents for complaints of pain x 1 month in her lower abdomen.  Contacted her GI provider several weeks ago, and they gave her an antispomodic to try, notes that this has not helped.  Pain is non-radiating, intermittent, can become intense (5-7/10) which is debilitating for her.  Pain eases with ES Tylenol, but does not like to take additional medication as she already regularly takes it in the evenings for her other ailments. NSAIDs don't help. Pain is sporadic. She wonders if it is related to her Gehrung pessary as she has had it in place for many years without being changed ("permanent pessary").  Denies vaginal discharge or bleeding. Also denies any recent changes in physical activity. Reports remote h/o ovarian cyst   The following portions of the patient's history were reviewed and updated as appropriate: allergies, current medications, past family history, past medical history, past social history, past surgical history, and problem list.  Review of Systems Pertinent items noted in HPI and remainder of comprehensive ROS otherwise negative.   Objective:   Blood pressure (!) 167/86, pulse 78, height 5\' 2"  (1.575 m), weight 106 lb (48.1 kg), SpO2 96 %. Body mass index is 19.39 kg/m. General appearance: alert and no distress Abdomen: soft, non-tender; bowel sounds normal; no masses,  no organomegaly Pelvic: external genitalia normal, rectovaginal septum normal.  Vagina without discharge, moderately atrophic, Gehrung pessary in place.  Cervix normal appearing, no lesions and no motion tenderness.  Uterus mobile, nontender, normal shape and size.  Adnexae non-palpable, however some fullness noted on left.  Extremities: extremities normal, atraumatic, no cyanosis or edema Neurologic: Grossly normal   Assessment:   1.  Cystocele with rectocele   2. Vaginal pessary in situ   3. Pelvic pain   4. Adnexal fullness      Plan:    Unclear cause of patient's pain as she has had the pessary in place for more than 20 years. No evidence of vaginal lesions, ulcerations, or erosion.  Has seen GI for colonoscopy in the past several months, and recent intervention with anti-spasmodic did not help symptoms. Pelvic fullness noted on today's exam, will schedule pelvic ultrasound.  If no significant findings, will continue investigation for pain.   Rubie Maid, MD [facility]

## 2021-11-11 NOTE — Patient Instructions (Signed)
Pelvic Pain, Female Pelvic pain is pain in your lower belly (abdomen), below your belly button and between your hips. The pain may: Start all of a sudden (be acute). Keep coming back (be recurring). Last a long time (become chronic). Pelvic pain that lasts longer than 6 months is called chronic pelvic pain. There are many causes of pelvic pain. Sometimes the cause of pelvic pain is not known. Follow these instructions at home:  Take over-the-counter and prescription medicines only as told by your doctor. Rest as told by your doctor. Do not have sex if it hurts. Keep a journal of your pelvic pain. Write down: When the pain started. Where the pain is located. What seems to make the pain better or worse, such as food or your monthly period (menstrual cycle). Any symptoms you have along with the pain. Keep all follow-up visits. Contact a doctor if: Medicine does not help your pain, or your pain comes back. You have new symptoms. You have unusual discharge or bleeding from your vagina. You have a fever or chills. You are having trouble pooping (constipation). You have blood in your pee (urine) or poop (stool). Your pee smells bad. You feel weak or light-headed. Get help right away if: You have sudden pain that is very bad. You have very bad pain and also have any of these symptoms: A fever. Feeling like you may vomit (nauseous). Vomiting. Being very sweaty. You faint. These symptoms may be an emergency. Get help right away. Call your local emergency services (911 in the [place] Do not wait to see if the symptoms will go away. Do not drive yourself to the hospital. Summary Pelvic pain is pain in your lower belly (abdomen), below your belly button and between your hips. There are many causes of pelvic pain. Keep a journal of your pelvic pain. This information is not intended to replace advice given to you by your health care provider. Make sure you discuss any questions you have with  your health care provider. Document Revised: 02/18/2021 Document Reviewed: 02/18/2021 Elsevier Patient Education  [employer].

## 2021-11-12 ENCOUNTER — Encounter (HOSPITAL_COMMUNITY): Payer: Self-pay

## 2021-11-13 ENCOUNTER — Other Ambulatory Visit: Payer: Self-pay

## 2021-11-13 ENCOUNTER — Ambulatory Visit (INDEPENDENT_AMBULATORY_CARE_PROVIDER_SITE_OTHER): Payer: PPO

## 2021-11-13 DIAGNOSIS — R102 Pelvic and perineal pain: Secondary | ICD-10-CM | POA: Diagnosis not present

## 2021-11-14 ENCOUNTER — Telehealth: Payer: Self-pay | Admitting: Obstetrics and Gynecology

## 2021-11-14 NOTE — Telephone Encounter (Signed)
Contacted patient regarding recent ultrasound results.  Reviewed findings with patient.  Advised that at this time it is recommended that she undergo further evaluation with hysteroscopy and removal of uterine mass.  Patient notes that she has had the mass for several years.  I discussed that although this is true, however the mass has increased in size, and if she is having pelvic pain I would recommend removal.  Discussed nature of the procedure, postoperative expectations with regards to recovery.  Patient notes that since she was seen in the office earlier this week her pain has increased.  She is using OTC Tylenol but is having to use more frequently.  Has an appointment with her PCP next week for additional evaluation of pain.  I discussed that although I cannot say definitively that the uterine mass is the cause of her pain, I still would like for her to have the mass removed.  I encouraged her to pursue further work-up for her pain in case it is non-GYN related.    Patient will contact the office next week after she has discussed with her family and seen her PCP to proceed with scheduling for surgery.   Rubie Maid, MD [facility]

## 2021-11-18 ENCOUNTER — Other Ambulatory Visit: Payer: Self-pay | Admitting: Obstetrics and Gynecology

## 2021-11-18 ENCOUNTER — Telehealth: Payer: Self-pay | Admitting: Obstetrics and Gynecology

## 2021-11-18 DIAGNOSIS — Z86000 Personal history of in-situ neoplasm of breast: Secondary | ICD-10-CM | POA: Diagnosis not present

## 2021-11-18 DIAGNOSIS — J8410 Pulmonary fibrosis, unspecified: Secondary | ICD-10-CM | POA: Diagnosis not present

## 2021-11-18 DIAGNOSIS — R102 Pelvic and perineal pain: Secondary | ICD-10-CM | POA: Diagnosis not present

## 2021-11-18 DIAGNOSIS — N858 Other specified noninflammatory disorders of uterus: Secondary | ICD-10-CM | POA: Diagnosis not present

## 2021-11-18 DIAGNOSIS — Z01818 Encounter for other preprocedural examination: Secondary | ICD-10-CM

## 2021-11-18 NOTE — Telephone Encounter (Signed)
That's fine. We can put her there.

## 2021-11-18 NOTE — Telephone Encounter (Signed)
Pt called stating that she consulted her PCP- Dr.Cline and he agreed with Dr.Cherry about Hysteroscopy- pt would like to go ahead and schedule ASAP. Was there a date you prefer? Next OR day is 2-13.

## 2021-11-25 ENCOUNTER — Telehealth: Payer: Self-pay | Admitting: Obstetrics and Gynecology

## 2021-11-25 NOTE — Telephone Encounter (Signed)
Pt has had active bleeding from vaginal area since yesterday 11/24/2021. Pt stated it started off brown in color and today is red in color. Pt stated her pain level has increased and is more frequent. Stated she is taken ultram for the pain. Patient would like to be contacted Via phone call with updates. Please advise.

## 2021-11-26 DIAGNOSIS — C55 Malignant neoplasm of uterus, part unspecified: Secondary | ICD-10-CM

## 2021-11-26 HISTORY — DX: Malignant neoplasm of uterus, part unspecified: C55

## 2021-11-26 NOTE — Telephone Encounter (Signed)
Called patient to see how she is doing. She has been controlling her pain with extra strength Tylenol. She gets complete relief with the Tylenol and Ultram. Today the bleeding is more like a period. Not heavy, but not light enough for a panty liner. She said she is still up and kicking and planning towards the 6th to have mass removed. She has been having some GI problems as well.

## 2021-11-27 ENCOUNTER — Other Ambulatory Visit: Payer: Self-pay

## 2021-11-27 ENCOUNTER — Encounter
Admission: RE | Admit: 2021-11-27 | Discharge: 2021-11-27 | Disposition: A | Discharge status: Home / Self Care | Payer: PPO | Source: Ambulatory Visit | Attending: Obstetrics and Gynecology | Admitting: Obstetrics and Gynecology

## 2021-11-27 DIAGNOSIS — K219 Gastro-esophageal reflux disease without esophagitis: Secondary | ICD-10-CM | POA: Diagnosis not present

## 2021-11-27 DIAGNOSIS — R102 Pelvic and perineal pain: Secondary | ICD-10-CM | POA: Diagnosis not present

## 2021-11-27 DIAGNOSIS — N858 Other specified noninflammatory disorders of uterus: Secondary | ICD-10-CM | POA: Diagnosis not present

## 2021-11-27 DIAGNOSIS — K31811 Angiodysplasia of stomach and duodenum with bleeding: Secondary | ICD-10-CM | POA: Diagnosis not present

## 2021-11-27 DIAGNOSIS — K222 Esophageal obstruction: Secondary | ICD-10-CM | POA: Diagnosis not present

## 2021-11-27 DIAGNOSIS — D500 Iron deficiency anemia secondary to blood loss (chronic): Secondary | ICD-10-CM | POA: Diagnosis not present

## 2021-11-27 HISTORY — DX: Personal history of other diseases of the digestive system: Z87.19

## 2021-11-27 NOTE — Patient Instructions (Signed)
Your procedure is scheduled on:12-01-21 Monday Report to the Registration Desk on the 1st floor of the [facility].Then proceed to the 2nd floor Surgery Desk in the [facility] To find out your arrival time, please call [phone] between 1PM - 3PM on:11-28-21 Friday  REMEMBER: Instructions that are not followed completely may result in serious medical risk, up to and including death; or upon the discretion of your surgeon and anesthesiologist your surgery may need to be rescheduled.  Do not eat food after midnight the night before surgery.  No gum chewing, lozengers or hard candies.  You may however, drink CLEAR liquids up to 2 hours before you are scheduled to arrive for your surgery. Do not drink anything within 2 hours of your scheduled arrival time.  Clear liquids include: - water  - apple juice without pulp - gatorade (not RED, PURPLE, OR BLUE) - black coffee or tea (Do NOT add milk or creamers to the coffee or tea) Do NOT drink anything that is not on this list.  TAKE THESE MEDICATIONS THE MORNING OF SURGERY WITH A SIP OF WATER: -gabapentin (NEURONTIN)  -pantoprazole (PROTONIX) -take one the night before and one on the morning of surgery - helps to prevent nausea after surgery.) -You may take Tylenol/Ultram the day of surgery if needed for pain  One week prior to surgery: Stop Anti-inflammatories (NSAIDS) such as Advil, Aleve, Ibuprofen, Motrin, Naproxen, Naprosyn and Aspirin based products such as Excedrin, Goodys Powder, BC Powder.You may however, continue to take Tylenol/Tramadol if needed for pain up until the day of surgery.  Stop ANY OVER THE COUNTER supplements/vitamins NOW (11-27-21) until after surgery (Calcium-Vitamin D, Coenzyme Q10, VITAMIN B-12, VITRON-C, FISH OIL, vitamin E )  No Alcohol for 24 hours before or after surgery.  No Smoking including e-cigarettes for 24 hours prior to surgery.  No chewable tobacco products for at least 6 hours prior to surgery.  No  nicotine patches on the day of surgery.  Do not use any "recreational" drugs for at least a week prior to your surgery.  Please be advised that the combination of cocaine and anesthesia may have negative outcomes, up to and including death. If you test positive for cocaine, your surgery will be cancelled.  On the morning of surgery brush your teeth with toothpaste and water, you may rinse your mouth with mouthwash if you wish. Do not swallow any toothpaste or mouthwash.  Do not wear jewelry, make-up, hairpins, clips or nail polish.  Do not wear lotions, powders, or perfumes.   Do not shave body from the neck down 48 hours prior to surgery just in case you cut yourself which could leave a site for infection.  Also, freshly shaved skin may become irritated if using the CHG soap.  Contact lenses, hearing aids and dentures may not be worn into surgery.  Do not bring valuables to the hospital. [facility] is not responsible for any missing/lost belongings or valuables.  Notify your doctor if there is any change in your medical condition (cold, fever, infection).  Wear comfortable clothing (specific to your surgery type) to the hospital.  After surgery, you can help prevent lung complications by doing breathing exercises.  Take deep breaths and cough every 1-2 hours. Your doctor may order a device called an Incentive Spirometer to help you take deep breaths. When coughing or sneezing, hold a pillow firmly against your incision with both hands. This is called splinting. Doing this helps protect your incision. It also decreases  belly discomfort.  If you are being admitted to the hospital overnight, leave your suitcase in the car. After surgery it may be brought to your room.  If you are being discharged the day of surgery, you will not be allowed to drive home. You will need a responsible adult (18 years or older) to drive you home and stay with you that night.   If you are taking public  transportation, you will need to have a responsible adult (18 years or older) with you. Please confirm with your physician that it is acceptable to use public transportation.   Please call the [facility] at [phone] if you have any questions about these instructions.  Surgery Visitation Policy:  Patients undergoing a surgery or procedure may have one family member or support person with them as long as that person is not COVID-19 positive or experiencing its symptoms.  That person may remain in the waiting area during the procedure and may rotate out with other people.  Inpatient Visitation:    Visiting hours are 7 a.m. to 8 p.m. Up to two visitors ages 16+ are allowed at one time in a patient room. The visitors may rotate out with other people during the day. Visitors must check out when they leave, or other visitors will not be allowed. One designated support person may remain overnight. The visitor must pass COVID-19 screenings, use hand sanitizer when entering and exiting the patients room and wear a mask at all times, including in the patients room. Patients must also wear a mask when staff or their visitor are in the room. Masking is required regardless of vaccination status.

## 2021-11-28 ENCOUNTER — Encounter
Admission: RE | Admit: 2021-11-28 | Discharge: 2021-11-28 | Disposition: A | Discharge status: Home / Self Care | Payer: PPO | Source: Ambulatory Visit | Attending: Obstetrics and Gynecology | Admitting: Obstetrics and Gynecology

## 2021-11-28 DIAGNOSIS — Z01818 Encounter for other preprocedural examination: Secondary | ICD-10-CM | POA: Diagnosis not present

## 2021-11-28 DIAGNOSIS — R54 Age-related physical debility: Secondary | ICD-10-CM | POA: Diagnosis not present

## 2021-11-28 LAB — CBC
HCT: 28.3 % — ABNORMAL LOW (ref 36.0–46.0)
Hemoglobin: 9.3 g/dL — ABNORMAL LOW (ref 12.0–15.0)
MCH: 30.3 pg (ref 26.0–34.0)
MCHC: 32.9 g/dL (ref 30.0–36.0)
MCV: 92.2 fL (ref 80.0–100.0)
Platelets: 277 10*3/uL (ref 150–400)
RBC: 3.07 MIL/uL — ABNORMAL LOW (ref 3.87–5.11)
RDW: 13.6 % (ref 11.5–15.5)
WBC: 7.4 10*3/uL (ref 4.0–10.5)
nRBC: 0 % (ref 0.0–0.2)

## 2021-11-30 MED ORDER — LACTATED RINGERS IV SOLN: INTRAVENOUS | Status: DC

## 2021-11-30 MED ORDER — ACETAMINOPHEN 500 MG PO TABS
1000.0000 mg | ORAL_TABLET | ORAL | Status: DC
Start: 2021-12-01 — End: 2021-12-01

## 2021-11-30 MED ORDER — ORAL CARE MOUTH RINSE: 15.0000 mL | Freq: Once | OROMUCOSAL | Status: AC

## 2021-11-30 MED ORDER — ARMC OPHTHALMIC DILATING DROPS: 1.0000 "application " | OPHTHALMIC | Status: DC | PRN

## 2021-11-30 MED ORDER — POVIDONE-IODINE 10 % EX SWAB
2.0000 "application " | Freq: Once | CUTANEOUS | Status: AC
  Administered 2021-12-01: 2 via TOPICAL

## 2021-11-30 MED ORDER — TETRACAINE HCL 0.5 % OP SOLN
1.0000 [drp] | OPHTHALMIC | Status: DC | PRN
Start: 2021-11-30 — End: 2021-12-01

## 2021-11-30 MED ORDER — CHLORHEXIDINE GLUCONATE 0.12 % MT SOLN: 15.0000 mL | Freq: Once | OROMUCOSAL | Status: AC

## 2021-12-01 ENCOUNTER — Other Ambulatory Visit: Payer: Self-pay

## 2021-12-01 ENCOUNTER — Encounter
Admission: RE | Disposition: A | Discharge status: Home / Self Care | Payer: Self-pay | Source: Home / Self Care | Attending: Obstetrics and Gynecology

## 2021-12-01 ENCOUNTER — Ambulatory Visit: Payer: PPO | Admitting: Anesthesiology

## 2021-12-01 ENCOUNTER — Ambulatory Visit
Admission: RE | Admit: 2021-12-01 | Discharge: 2021-12-01 | Disposition: A | Discharge status: Home / Self Care | Payer: PPO | Attending: Obstetrics and Gynecology | Admitting: Obstetrics and Gynecology

## 2021-12-01 ENCOUNTER — Telehealth: Payer: Self-pay

## 2021-12-01 ENCOUNTER — Encounter: Payer: Self-pay | Admitting: Obstetrics and Gynecology

## 2021-12-01 DIAGNOSIS — I7300 Raynaud's syndrome without gangrene: Secondary | ICD-10-CM | POA: Insufficient documentation

## 2021-12-01 DIAGNOSIS — N950 Postmenopausal bleeding: Secondary | ICD-10-CM | POA: Insufficient documentation

## 2021-12-01 DIAGNOSIS — R519 Headache, unspecified: Secondary | ICD-10-CM | POA: Insufficient documentation

## 2021-12-01 DIAGNOSIS — C541 Malignant neoplasm of endometrium: Secondary | ICD-10-CM | POA: Diagnosis not present

## 2021-12-01 DIAGNOSIS — D649 Anemia, unspecified: Secondary | ICD-10-CM | POA: Diagnosis not present

## 2021-12-01 DIAGNOSIS — R102 Pelvic and perineal pain: Secondary | ICD-10-CM

## 2021-12-01 DIAGNOSIS — N858 Other specified noninflammatory disorders of uterus: Secondary | ICD-10-CM

## 2021-12-01 DIAGNOSIS — K219 Gastro-esophageal reflux disease without esophagitis: Secondary | ICD-10-CM | POA: Insufficient documentation

## 2021-12-01 DIAGNOSIS — C577 Malignant neoplasm of other specified female genital organs: Secondary | ICD-10-CM

## 2021-12-01 DIAGNOSIS — Z960 Presence of urogenital implants: Secondary | ICD-10-CM | POA: Diagnosis not present

## 2021-12-01 DIAGNOSIS — N859 Noninflammatory disorder of uterus, unspecified: Secondary | ICD-10-CM | POA: Diagnosis not present

## 2021-12-01 HISTORY — PX: DILATATION & CURETTAGE/HYSTEROSCOPY WITH MYOSURE: SHX6511

## 2021-12-01 SURGERY — DILATATION & CURETTAGE/HYSTEROSCOPY WITH MYOSURE
Anesthesia: General

## 2021-12-01 MED ORDER — OXYCODONE HCL 5 MG/5ML PO SOLN: 5.0000 mg | Freq: Once | ORAL | Status: AC | PRN

## 2021-12-01 MED ORDER — SODIUM CHLORIDE 0.9 % IR SOLN
Status: DC | PRN
  Administered 2021-12-01: 3000 mL

## 2021-12-01 MED ORDER — IBUPROFEN 600 MG PO TABS: 600.0000 mg | ORAL_TABLET | Freq: Four times a day (QID) | ORAL | 0 refills | Status: DC | PRN

## 2021-12-01 MED ORDER — PROPOFOL 500 MG/50ML IV EMUL
INTRAVENOUS | Status: DC | PRN
  Administered 2021-12-01: 100 ug/kg/min via INTRAVENOUS

## 2021-12-01 MED ORDER — ACETAMINOPHEN 500 MG PO TABS
ORAL_TABLET | ORAL | Status: AC
  Filled 2021-12-01: qty 2

## 2021-12-01 MED ORDER — FENTANYL CITRATE (PF) 100 MCG/2ML IJ SOLN
25.0000 ug | INTRAMUSCULAR | Status: DC | PRN
  Administered 2021-12-01 (×3): 25 ug via INTRAVENOUS

## 2021-12-01 MED ORDER — PHENYLEPHRINE 40 MCG/ML (10ML) SYRINGE FOR IV PUSH (FOR BLOOD PRESSURE SUPPORT)
PREFILLED_SYRINGE | INTRAVENOUS | Status: DC | PRN
  Administered 2021-12-01: 100 ug via INTRAVENOUS

## 2021-12-01 MED ORDER — MIDAZOLAM HCL 2 MG/2ML IJ SOLN
INTRAMUSCULAR | Status: DC | PRN
  Administered 2021-12-01: 1 mg via INTRAVENOUS

## 2021-12-01 MED ORDER — FENTANYL CITRATE (PF) 100 MCG/2ML IJ SOLN
INTRAMUSCULAR | Status: AC
  Filled 2021-12-01: qty 2

## 2021-12-01 MED ORDER — OXYCODONE HCL 5 MG PO TABS
5.0000 mg | ORAL_TABLET | Freq: Once | ORAL | Status: AC | PRN
Start: 2021-12-01 — End: 2021-12-01
  Administered 2021-12-01: 5 mg via ORAL

## 2021-12-01 MED ORDER — FENTANYL CITRATE (PF) 100 MCG/2ML IJ SOLN
INTRAMUSCULAR | Status: AC
  Administered 2021-12-01: 25 ug via INTRAVENOUS
  Filled 2021-12-01: qty 2

## 2021-12-01 MED ORDER — ONDANSETRON HCL 4 MG/2ML IJ SOLN
INTRAMUSCULAR | Status: DC | PRN
  Administered 2021-12-01: 4 mg via INTRAVENOUS

## 2021-12-01 MED ORDER — TRAMADOL HCL 50 MG PO TABS: 50.0000 mg | ORAL_TABLET | Freq: Four times a day (QID) | ORAL | 0 refills | Status: DC | PRN

## 2021-12-01 MED ORDER — PROPOFOL 10 MG/ML IV BOLUS
INTRAVENOUS | Status: DC | PRN
Start: 2021-12-01 — End: 2021-12-01
  Administered 2021-12-01: 40 mg via INTRAVENOUS

## 2021-12-01 MED ORDER — ONDANSETRON HCL 4 MG/2ML IJ SOLN: 4.0000 mg | Freq: Once | INTRAMUSCULAR | Status: DC | PRN

## 2021-12-01 MED ORDER — LIDOCAINE HCL (CARDIAC) PF 100 MG/5ML IV SOSY
PREFILLED_SYRINGE | INTRAVENOUS | Status: DC | PRN
Start: 2021-12-01 — End: 2021-12-01
  Administered 2021-12-01: 50 mg via INTRAVENOUS

## 2021-12-01 MED ORDER — CHLORHEXIDINE GLUCONATE 0.12 % MT SOLN
OROMUCOSAL | Status: AC
  Administered 2021-12-01: 15 mL via OROMUCOSAL
  Filled 2021-12-01: qty 15

## 2021-12-01 MED ORDER — ACETAMINOPHEN 10 MG/ML IV SOLN: 1000.0000 mg | Freq: Once | INTRAVENOUS | Status: DC | PRN

## 2021-12-01 MED ORDER — MIDAZOLAM HCL 2 MG/2ML IJ SOLN
INTRAMUSCULAR | Status: AC
  Filled 2021-12-01: qty 2

## 2021-12-01 MED ORDER — PROPOFOL 10 MG/ML IV BOLUS
INTRAVENOUS | Status: AC
  Filled 2021-12-01: qty 60

## 2021-12-01 MED ORDER — OXYCODONE HCL 5 MG PO TABS
ORAL_TABLET | ORAL | Status: AC
  Filled 2021-12-01: qty 1

## 2021-12-01 MED ORDER — 0.9 % SODIUM CHLORIDE (POUR BTL) OPTIME
TOPICAL | Status: DC | PRN
  Administered 2021-12-01: 500 mL

## 2021-12-01 MED ORDER — FENTANYL CITRATE (PF) 100 MCG/2ML IJ SOLN
INTRAMUSCULAR | Status: DC | PRN
  Administered 2021-12-01: 25 ug via INTRAVENOUS

## 2021-12-01 MED ORDER — LACTATED RINGERS IV SOLN: INTRAVENOUS | Status: DC

## 2021-12-01 SURGICAL SUPPLY — 29 items
BACTOSHIELD CHG 4% 4OZ (MISCELLANEOUS) ×1
CANISTER SUC SOCK COL 7IN (MISCELLANEOUS) ×2 IMPLANT
CNTNR SPEC 2.5X3XGRAD LEK (MISCELLANEOUS) ×1
CONT SPEC 4OZ STER OR WHT (MISCELLANEOUS) ×1
CONT SPEC 4OZ STRL OR WHT (MISCELLANEOUS) ×1
CONTAINER SPEC 2.5X3XGRAD LEK (MISCELLANEOUS) IMPLANT
DEVICE MYOSURE LITE (MISCELLANEOUS) IMPLANT
DRSG TELFA 3X8 NADH (GAUZE/BANDAGES/DRESSINGS) ×2 IMPLANT
ELECT REM PT RETURN 9FT ADLT (ELECTROSURGICAL)
ELECTRODE REM PT RTRN 9FT ADLT (ELECTROSURGICAL) ×1 IMPLANT
GAUZE 4X4 16PLY ~~LOC~~+RFID DBL (SPONGE) ×2 IMPLANT
GLOVE SURG ENC MOIS LTX SZ6.5 (GLOVE) ×2 IMPLANT
GOWN STRL REUS W/ TWL LRG LVL3 (GOWN DISPOSABLE) ×2 IMPLANT
GOWN STRL REUS W/TWL LRG LVL3 (GOWN DISPOSABLE) ×4
IV NS IRRIG 3000ML ARTHROMATIC (IV SOLUTION) ×2 IMPLANT
KIT PROCEDURE FLUENT (KITS) ×1 IMPLANT
KIT TURNOVER CYSTO (KITS) ×2 IMPLANT
MANIFOLD NEPTUNE II (INSTRUMENTS) ×2 IMPLANT
MYOSURE XL FIBROID (MISCELLANEOUS) ×2
PACK DNC HYST (MISCELLANEOUS) ×2 IMPLANT
PAD DRESSING TELFA 3X8 NADH (GAUZE/BANDAGES/DRESSINGS) IMPLANT
PAD OB MATERNITY 4.3X12.25 (PERSONAL CARE ITEMS) ×2 IMPLANT
PAD PREP 24X41 OB/GYN DISP (PERSONAL CARE ITEMS) ×2 IMPLANT
SCRUB CHG 4% DYNA-HEX 4OZ (MISCELLANEOUS) ×1 IMPLANT
SEAL ROD LENS SCOPE MYOSURE (ABLATOR) ×2 IMPLANT
SET CYSTO W/LG BORE CLAMP LF (SET/KITS/TRAYS/PACK) IMPLANT
SYSTEM TISS REMOVAL MYOSURE XL (MISCELLANEOUS) IMPLANT
TUBING CONNECTING 10 (TUBING) IMPLANT
WATER STERILE IRR 500ML POUR (IV SOLUTION) ×2 IMPLANT

## 2021-12-01 NOTE — Op Note (Signed)
Procedure(s): DILATATION & CURETTAGE/HYSTEROSCOPY WITH MYOSURE Procedure Note  Elizabeth Friedman female 81 y.o. 12/01/2021  Indications: The patient is a 81 y.o. G49P2002 female with intrauterine mass (polyp vs other neoplasm), pelvic pain, postmenopausal bleeding. Patient also with long history of pessary use (Gehrung, "permanent pessary") in place.   Pre-operative Diagnosis: Intrauterine mass, pelvic pain, postmenopausal bleeding, vaginal pessary in situ  Post-operative Diagnosis: Same  Surgeon: Rubie Maid, MD  Assistants:  None.  Anesthesia: General endotracheal anesthesia  Procedure Details: The patient was seen in the Holding Room. The risks, benefits, complications, treatment options, and expected outcomes were discussed with the patient.  The patient concurred with the proposed plan, giving informed consent.  The site of surgery properly noted/marked. The patient was taken to the Operating Room, identified as Elizabeth Friedman and the procedure verified as Procedure(s) (LRB): DILATATION & CURETTAGE/HYSTEROSCOPY WITH MYOSURE (N/A). A Time Out was held and the above information confirmed.  She was then placed under general anesthesia without difficulty. She was placed in the dorsal lithotomy position, and was prepped and draped in a sterile manner.  A straight catheterization was performed. A sterile speculum was inserted into the vagina and the cervix was grasped at the anterior lip using a single-toothed tenaculum.  The Gehrung pessary was visualized, and the vaginal tissue was examined to ensure no evidence of tissue erosion or laceration that could otherwise explain her postmenopausal bleeding.  The uterus was sounded to 8.5 cm.  The cervix was dilated manually with Hank dilators to accommodate the diagnostic hysteroscope.  Once the cervix was dilated, the hysteroscope was inserted under direct visualization.  The uterine cavity was carefully examined, unable to visualize the tubal ostia  bilaterally.  Within the endometrial cavity, there was noted to be atrophic endometrium with polypoid fragments projecting from the right uterine wall noted. The Myosure XL device was inserted and the Myosure device was activated to remove the intrauterine mass. The hysteroscope was removed and a curettage was done to obtain endometrial curettings.  The tenaculum was removed from the anterior lip of the cervix, and the vaginal speculum was removed after noting good hemostasis.  The patient tolerated the procedure well and was taken to the recovery area awake, extubated and in stable condition.  The patient will be discharged to home as per PACU criteria.  Routine postoperative instructions given.  She was prescribed Percocet, Ibuprofen and Colace.  She will follow up in the clinic in 2 weeks for postoperative evaluation.  Findings: The uterus was sounded to 8.5 Fallopian tubes and ovaries appeared normal.   Estimated Blood Loss:  10 ml      Drains: straight catheterization prior to procedure with  220 ml of clear urine         Total IV Fluids:   600 ml  Fluid Deficit: 150 ml  Specimens: Hysteroscopic biopsy of intrauterine mass, endometrial curettings         Implants: None         Complications:  None; patient tolerated the procedure well.         Disposition: PACU - hemodynamically stable.         Condition: stable   Rubie Maid, MD [facility]

## 2021-12-01 NOTE — H&P (Signed)
GYNECOLOGY PREOPERATIVE HISTORY AND PHYSICAL   Subjective:  Elizabeth Friedman is a 81 y.o. [phone] here for surgical management of pelvic pain, intrauterine mass, postmenopausal spotting.    Preoperative concern includes Gehrung pessary that has been in  place for many years without being changed ("permanent pessary").    Proposed surgery: Hysteroscopy D&C, polypectomy(?)    Pertinent Gynecological History: Menses: post-menopausal Bleeding: post menopausal spotting x 1 week Last pap:  no longer needed. No prior history of cervical dysplasia.     Past Medical History:  Diagnosis Date   Allergic rhinitis    Anemia    Anginal pain (HCC)    Arthritis    Atypical chest pain    Breast cancer ([place])    Cancer (HCC)    skin   Fibrocystic breast disease    GERD (gastroesophageal reflux disease)    Hearing aid worn    bilateral   History of hiatal hernia    Hyperlipidemia    Lung nodule    Menopause    Migraine    Migraine    optical migraines   Motion sickness    Plantar fasciitis    Prolapse of female pelvic organs    hodge pessary   Raynaud's disease    Sciatic leg pain    Sciatica    Urinary retention with incomplete bladder emptying    Vitreous detachment of both eyes     Past Surgical History:  Procedure Laterality Date   APPENDECTOMY     BREAST LUMPECTOMY WITH RADIOACTIVE SEED LOCALIZATION Right 05/29/2020   Procedure: RIGHT BREAST LUMPECTOMY WITH RADIOACTIVE SEED LOCALIZATION;  Surgeon: Rolm Bookbinder, MD;  Location: [facility];  Service: General;  Laterality: Right;   CARDIAC CATHETERIZATION     CATARACT EXTRACTION W/PHACO Right 02/26/2021   Procedure: CATARACT EXTRACTION PHACO AND INTRAOCULAR LENS PLACEMENT (IOC) RIGHT 5.86 01:19.6 7.4%;  Surgeon: Leandrew Koyanagi, MD;  Location: [facility];  Service: Ophthalmology;  Laterality: Right;   CATARACT EXTRACTION W/PHACO Left 09/03/2021   Procedure: CATARACT EXTRACTION PHACO AND INTRAOCULAR  LENS PLACEMENT ([place]) LEFT;  Surgeon: Leandrew Koyanagi, MD;  Location: [facility];  Service: Ophthalmology;  Laterality: Left;  9.61 01:15.9   COLONOSCOPY WITH PROPOFOL N/A 03/27/2016   Procedure: COLONOSCOPY WITH PROPOFOL;  Surgeon: Manya Silvas, MD;  Location: [facility] ENDOSCOPY;  Service: Endoscopy;  Laterality: N/A;   COLONOSCOPY WITH PROPOFOL N/A 08/14/2020   Procedure: COLONOSCOPY WITH PROPOFOL;  Surgeon: Toledo, Benay Pike, MD;  Location: [facility] ENDOSCOPY;  Service: Gastroenterology;  Laterality: N/A;   ESOPHAGOGASTRODUODENOSCOPY (EGD) WITH PROPOFOL N/A 08/14/2020   Procedure: ESOPHAGOGASTRODUODENOSCOPY (EGD) WITH PROPOFOL;  Surgeon: Toledo, Benay Pike, MD;  Location: [facility] ENDOSCOPY;  Service: Gastroenterology;  Laterality: N/A;   ESOPHAGOGASTRODUODENOSCOPY (EGD) WITH PROPOFOL N/A 03/21/2021   Procedure: ESOPHAGOGASTRODUODENOSCOPY (EGD) WITH PROPOFOL;  Surgeon: Toledo, Benay Pike, MD;  Location: [facility] ENDOSCOPY;  Service: Gastroenterology;  Laterality: N/A;   SKIN CANCER EXCISION     face and neck    OB History  Gravida Para Term Preterm AB Living  2 2 2     2   SAB IAB Ectopic Multiple Live Births          2    # Outcome Date GA Lbr Len/2nd Weight Sex Delivery Anes PTL Lv  2 Term 1964    F Vag-Spont   LIV  1 Term 1962    F Vag-Spont   LIV    Family History  Problem Relation Age of Onset  Breast cancer Mother    Heart disease Father    Colon cancer Brother    Diabetes Neg Hx    Ovarian cancer Neg Hx     Social History   Socioeconomic History   Marital status: Widowed    Spouse name: Not on file   Number of children: Not on file   Years of education: Not on file   Highest education level: Not on file  Occupational History   Not on file  Tobacco Use   Smoking status: Never   Smokeless tobacco: Never  Vaping Use   Vaping Use: Never used  Substance and Sexual Activity   Alcohol use: No   Drug use: No   Sexual activity: Not Currently    Birth  control/protection: Post-menopausal  Other Topics Concern   Not on file  Social History Narrative   Not on file   Social Determinants of Health   Financial Resource Strain: Not on file  Food Insecurity: Not on file  Transportation Needs: Not on file  Physical Activity: Not on file  Stress: Not on file  Social Connections: Not on file  Intimate Partner Violence: Not on file    No current facility-administered medications on file prior to encounter.   Current Outpatient Medications on File Prior to Encounter  Medication Sig Dispense Refill   acetaminophen (TYLENOL) 500 MG tablet Take 1,000 mg by mouth every 6 (six) hours as needed for moderate pain or mild pain.     alum & mag hydroxide-simeth (MAALOX/MYLANTA) 200-200-20 MG/5ML suspension Take 30 mLs by mouth every 6 (six) hours as needed for indigestion or heartburn.     Calcium-Vitamin D (CALTRATE 600 PLUS-VIT D PO) Take 1 tablet by mouth daily.     celecoxib (CELEBREX) 200 MG capsule Take 200 mg by mouth 2 (two) times daily.     Cholecalciferol (VITAMIN D) 125 MCG (5000 UT) CAPS Take 5,000 Units by mouth daily.     Coenzyme Q10 200 MG TABS Take 200 mg by mouth daily.     Cyanocobalamin (VITAMIN B-12 PO) Take 1 tablet by mouth daily.     gabapentin (NEURONTIN) 100 MG capsule Take 100 mg by mouth 4 (four) times daily.     Iron-Vitamin C (VITRON-C) 65-125 MG TABS Take 1 tablet by mouth daily.     Omega-3 Fatty Acids (FISH OIL PO) Take 1,400 mg by mouth daily.     pantoprazole (PROTONIX) 40 MG tablet Take 40 mg by mouth every morning.     RESTASIS 0.05 % ophthalmic emulsion Place 1 drop into both eyes 2 (two) times daily.     simvastatin (ZOCOR) 10 MG tablet Take 10 mg by mouth at bedtime.     topiramate (TOPAMAX) 50 MG tablet Take 50 mg by mouth at bedtime.     Triamcinolone Acetonide (NASACORT ALLERGY 24HR NA) Place 1 spray into the nose daily as needed (sinus).     vitamin E 180 MG (400 UNITS) capsule Take 400 Units by mouth  daily.     No Known Allergies    Review of Systems Constitutional: No recent fever/chills/sweats Respiratory: No recent cough/bronchitis Cardiovascular: No chest pain Gastrointestinal: No recent nausea/vomiting/diarrhea Genitourinary: No UTI symptoms Hematologic/lymphatic:No history of coagulopathy or recent blood thinner use    Objective:   Blood pressure (!) 178/76, pulse 81, temperature 97.8 F (36.6 C), temperature source Oral, resp. rate 16, height 5\' 2"  (1.575 m), SpO2 100 %. CONSTITUTIONAL: Well-developed, well-nourished female in no acute distress.  HENT:  Normocephalic, atraumatic, External right and left ear normal. Oropharynx is clear and moist EYES: Conjunctivae and EOM are normal. Pupils are equal, round, and reactive to light. No scleral icterus.  NECK: Normal range of motion, supple, no masses SKIN: Skin is warm and dry. No rash noted. Not diaphoretic. No erythema. No pallor. NEUROLOGIC: Alert and oriented to person, place, and time. Normal reflexes, muscle tone coordination. No cranial nerve deficit noted. PSYCHIATRIC: Normal mood and affect. Normal behavior. Normal judgment and thought content. CARDIOVASCULAR: Normal heart rate noted, regular rhythm RESPIRATORY: Effort and breath sounds normal, no problems with respiration noted ABDOMEN: Soft, nontender, nondistended. PELVIC: Deferred MUSCULOSKELETAL: Normal range of motion. No edema and no tenderness. 2+ distal pulses.    Labs: Results for orders placed or performed during the hospital encounter of 11/28/21 (from the past 336 hour(s))  CBC   Collection Time: 11/28/21  2:56 PM  Result Value Ref Range   WBC 7.4 4.0 - 10.5 K/uL   RBC 3.07 (L) 3.87 - 5.11 MIL/uL   Hemoglobin 9.3 (L) 12.0 - 15.0 g/dL   HCT 28.3 (L) 36.0 - 46.0 %   MCV 92.2 80.0 - 100.0 fL   MCH 30.3 26.0 - 34.0 pg   MCHC 32.9 30.0 - 36.0 g/dL   RDW 13.6 11.5 - 15.5 %   Platelets 277 150 - 400 K/uL   nRBC 0.0 0.0 - 0.2 %     Imaging  Studies: US PELVIS (TRANSABDOMINAL ONLY)  Result Date: 11/13/2021 Patient Name: CHAYAH MCKEE DOB: [birth year] MRN: [id number] ULTRASOUND REPORT Location: [facility] Date of Service: 11/13/2021 Indications:Pelvic Pain Findings: The uterus is anteverted and measures 9.6 X 7.1 X 5.5 cm. Echo texture is heterogenous with evidence of focal mass in the Endometrium. The Endometrium measures 35 mm.  There is an echogenic,  mixed echogenicity mass in the endometrial canal ,measuring: 3.7 x 4.1 x 3.3 cm; This is hyperemic and has a small amount of fluid surrounding it within the endometrial cavity. Right Ovary is not visualized. Left Ovary measures 2.8 x  2.7 x 1.5 cm. It is normal in appearance. Survey of the adnexae dmonstrates no adnexal masses. There is no free fluid in the cul de sac. Impression: 1. Polyp or other neoplasm of the endometrium. Recommendations: 1.Clinical correlation with the patient's History and Physical Exam. Gaspar Cola I have reviewed this study and agree with documented findings. Rubie Maid, MD [facility]   Assessment:    Pelvic pain Postmenopausal spotting Intrauterine mass (polyp vs other neoplasm)   Plan:   - Counseling: Procedure, risks, reasons, benefits and complications (including injury to bowel, bladder, major blood vessel, ureter, bleeding, possibility of transfusion, infection, or fistula formation) reviewed in detail. Likelihood of success in alleviating the patient's condition was discussed. Routine postoperative instructions will be reviewed with the patient and her family in detail after surgery.  The patient concurred with the proposed plan, giving informed written consent for the surgery.   Preop testing ordered. - Patient has been NPO since midnight.   Rubie Maid, MD [facility]

## 2021-12-01 NOTE — Anesthesia Preprocedure Evaluation (Addendum)
Anesthesia Evaluation  Patient identified by MRN, date of birth, ID band Patient awake    Reviewed: Allergy & Precautions, NPO status , Patient's Chart, lab work & pertinent test results  History of Anesthesia Complications Negative for: history of anesthetic complications  Airway Mallampati: III   Neck ROM: Full    Dental no notable dental hx.    Pulmonary neg pulmonary ROS,    Pulmonary exam normal breath sounds clear to auscultation       Cardiovascular Exercise Tolerance: Good negative cardio ROS Normal cardiovascular exam Rhythm:Regular Rate:Normal  ECG 11/28/21:  Sinus rhythm with Premature supraventricular complexes Nonspecific ST and T wave abnormality   Neuro/Psych  Headaches, HOH    GI/Hepatic GERD  ,  Endo/Other  negative endocrine ROS  Renal/GU negative Renal ROS     Musculoskeletal Raynaud's   Abdominal   Peds  Hematology  (+) Blood dyscrasia, anemia , Breast and skin CA   Anesthesia Other Findings   Reproductive/Obstetrics                            Anesthesia Physical Anesthesia Plan  ASA: 2  Anesthesia Plan: General   Post-op Pain Management:    Induction: Intravenous  PONV Risk Score and Plan: 3 and Propofol infusion, TIVA, Treatment may vary due to age or medical condition and Ondansetron  Airway Management Planned: Natural Airway  Additional Equipment:   Intra-op Plan:   Post-operative Plan:   Informed Consent: I have reviewed the patients History and Physical, chart, labs and discussed the procedure including the risks, benefits and alternatives for the proposed anesthesia with the patient or authorized representative who has indicated his/her understanding and acceptance.       Plan Discussed with: CRNA  Anesthesia Plan Comments: (LMA/GETA backup discussed.  Patient consented for risks of anesthesia including but not limited to:  - adverse  reactions to medications - damage to eyes, teeth, lips or other oral mucosa - nerve damage due to positioning  - sore throat or hoarseness - damage to heart, brain, nerves, lungs, other parts of body or loss of life  Informed patient about role of CRNA in peri- and intra-operative care.  Patient voiced understanding.)        Anesthesia Quick Evaluation

## 2021-12-01 NOTE — Transfer of Care (Signed)
Immediate Anesthesia Transfer of Care Note  Patient: Elizabeth Friedman  Procedure(s) Performed: DILATATION & CURETTAGE/HYSTEROSCOPY WITH MYOSURE  Patient Location: PACU  Anesthesia Type:General  Level of Consciousness: awake, drowsy and patient cooperative  Airway & Oxygen Therapy: Patient Spontanous Breathing  Post-op Assessment: Report given to RN and Post -op Vital signs reviewed and stable  Post vital signs: Reviewed and stable  Last Vitals:  Vitals Value Taken Time  BP 133/67 12/01/21 1418  Temp 36.8 C 12/01/21 1418  Pulse 74 12/01/21 1421  Resp 9 12/01/21 1421  SpO2 98 % 12/01/21 1421  Vitals shown include unvalidated device data.  Last Pain:  Vitals:   12/01/21 1418  TempSrc:   PainSc: Asleep         Complications: No notable events documented.

## 2021-12-01 NOTE — Anesthesia Postprocedure Evaluation (Signed)
Anesthesia Post Note  Patient: Elizabeth Friedman  Procedure(s) Performed: DILATATION & CURETTAGE/HYSTEROSCOPY WITH [place]  Patient location during evaluation: PACU Anesthesia Type: General Level of consciousness: awake and alert, oriented and patient cooperative Pain management: pain level controlled Vital Signs Assessment: post-procedure vital signs reviewed and stable Respiratory status: spontaneous breathing, nonlabored ventilation and respiratory function stable Cardiovascular status: blood pressure returned to baseline and stable Postop Assessment: adequate PO intake Anesthetic complications: no   No notable events documented.   Last Vitals:  Vitals:   12/01/21 1450 12/01/21 1500  BP:  (!) 142/73  Pulse: 68 66  Resp: 14 17  Temp:  (!) 36.2 C  SpO2: 98% 99%    Last Pain:  Vitals:   12/01/21 1500  TempSrc:   PainSc: [place]

## 2021-12-01 NOTE — Discharge Instructions (Signed)
AMBULATORY SURGERY  ?DISCHARGE INSTRUCTIONS ? ? ?The drugs that you were given will stay in your system until tomorrow so for the next 24 hours you should not: ? ?Drive an automobile ?Make any legal decisions ?Drink any alcoholic beverage ? ? ?You may resume regular meals tomorrow.  Today it is better to start with liquids and gradually work up to solid foods. ? ?You may eat anything you prefer, but it is better to start with liquids, then soup and crackers, and gradually work up to solid foods. ? ? ?Please notify your doctor immediately if you have any unusual bleeding, trouble breathing, redness and pain at the surgery site, drainage, fever, or pain not relieved by medication. ? ? ? ?Additional Instructions: ? ? ? ?Please contact your physician with any problems or Same Day Surgery at [phone], Monday through Friday 6 am to 4 pm, or [place] [facility] Main number at [phone].  ?

## 2021-12-01 NOTE — Telephone Encounter (Signed)
Patient can be seen at 11:30 on Feb 22nd.

## 2021-12-02 ENCOUNTER — Encounter: Payer: Self-pay | Admitting: Obstetrics and Gynecology

## 2021-12-05 ENCOUNTER — Telehealth: Payer: Self-pay | Admitting: Obstetrics and Gynecology

## 2021-12-05 ENCOUNTER — Encounter: Payer: Self-pay | Admitting: Obstetrics and Gynecology

## 2021-12-05 NOTE — Telephone Encounter (Signed)
Pt called today asking to speak with Dr.Cherry- states that she has a lot of questions about a cancer dx in her chart, her family has questions and she doesn't know how to answer those questions.

## 2021-12-05 NOTE — Telephone Encounter (Signed)
Patient called and reported that she had some gushing bright red bleeding while using the bathroom. She wanted to get some advice about the bleeding before the weekend. She has been on her feet all day and lifting small grocery bags and she felt gushing blood again. When she stand up she can feel blood gushing out of her. She said that this is not spotting at all.  Please call patient if you can.   CB

## 2021-12-05 NOTE — Telephone Encounter (Signed)
Some light bleeding/spotting can be normal. If she bleeds to the point of filling a pad in 1-2 hours then she needs to follow up in the hospital.  She should also take it easy as excessive activity can also cause bleeding to occur.

## 2021-12-05 NOTE — Telephone Encounter (Signed)
Please have patient scheduled for 3:00 on Monday to discuss her results .

## 2021-12-05 NOTE — Telephone Encounter (Signed)
Scheduled

## 2021-12-08 ENCOUNTER — Other Ambulatory Visit: Payer: Self-pay

## 2021-12-08 ENCOUNTER — Ambulatory Visit (INDEPENDENT_AMBULATORY_CARE_PROVIDER_SITE_OTHER): Payer: PPO | Admitting: Obstetrics and Gynecology

## 2021-12-08 ENCOUNTER — Encounter: Payer: Self-pay | Admitting: Obstetrics and Gynecology

## 2021-12-08 VITALS — BP 168/72 | HR 77 | Resp 16 | Ht 62.0 in | Wt 103.2 lb

## 2021-12-08 DIAGNOSIS — Z9889 Other specified postprocedural states: Secondary | ICD-10-CM

## 2021-12-08 DIAGNOSIS — R918 Other nonspecific abnormal finding of lung field: Secondary | ICD-10-CM

## 2021-12-08 DIAGNOSIS — D638 Anemia in other chronic diseases classified elsewhere: Secondary | ICD-10-CM

## 2021-12-08 DIAGNOSIS — Z86000 Personal history of in-situ neoplasm of breast: Secondary | ICD-10-CM

## 2021-12-08 DIAGNOSIS — C55 Malignant neoplasm of uterus, part unspecified: Secondary | ICD-10-CM

## 2021-12-08 DIAGNOSIS — Z09 Encounter for follow-up examination after completed treatment for conditions other than malignant neoplasm: Secondary | ICD-10-CM

## 2021-12-08 NOTE — Progress Notes (Signed)
OBSTETRICS/GYNECOLOGY POST-OPERATIVE CLINIC VISIT  Subjective:     Elizabeth Friedman is a 81 y.o. G25P2002 female with a PMH of breast cancer (right sided ductal carcinoma, s/p lumpectomy followed by radiation) diagnosed in 2021, who presents to the clinic 7 days status post operative hysteroscopy with removal of endometrial mass and dilation and curettage. Surgery performed for persistent  endometrial mass and new onset postmenopausal bleeding . Eating a regular diet without difficulty. Bowel movements are normal. Pain is controlled with current analgesics. Medications being used: acetaminophen and ibuprofen (OTC).  Patient noted a gush of bright red blood on Friday, however did admit to "overdoing it".  Bleeding has lightened over the weekend, and now down to spotting today.   The following portions of the patient's history were reviewed and updated as appropriate: allergies, current medications, past family history, past medical history, past social history, past surgical history, and problem list.   Review of Systems Pertinent items noted in HPI and remainder of comprehensive ROS otherwise negative.   Objective:   BP (!) 168/72    Pulse 77    Resp 16    Ht 5\' 2"  (1.575 m)    Wt 103 lb 3.2 oz (46.8 kg)    BMI 18.88 kg/m  Body mass index is 18.88 kg/m.  General:  alert and no distress  Abdomen: soft, bowel sounds active, non-tender  Pelvis:   deferred    Labs:  Lab Results  Component Value Date   WBC 7.4 11/28/2021   HGB 9.3 (L) 11/28/2021   HCT 28.3 (L) 11/28/2021   MCV 92.2 11/28/2021   PLT 277 11/28/2021     Pathology:   Results for orders placed or performed during the hospital encounter of 12/01/21  Surgical pathology  Result Value Ref Range   SURGICAL PATHOLOGY      SURGICAL PATHOLOGY CASE: [id number] PATIENT: Elizabeth Friedman Surgical Pathology Report     Specimen Submitted: A. Endometrial curettings B. Endometrial mass  Clinical History: Endometrial  mass      DIAGNOSIS: A. ENDOMETRIUM; CURETTAGE: - CARCINOSARCOMA / MALIGNANT MIXED MULLERIAN TUMOR.  B. ENDOMETRIAL MASS; CURETTAGE: - CARCINOSARCOMA / MALIGNANT MIXED MULLERIAN TUMOR.   GROSS DESCRIPTION: A. Labeled: Endometrial curettings Received: Fresh Collection time: 1:44 PM on 12/01/2021 Placed into formalin time: 2:38 PM on 12/01/2021 Tissue fragment(s): Multiple Size: Aggregate, 2.5 x 2.4 x 0.6 cm Description: Received on multiple Telfa pads are fragments of white-tan soft tissue and blood clot. Entirely submitted in 1 cassette.  B. Labeled: Endometrial mass Received: Fresh Collection time: 1:45 PM on 12/01/2021 Placed into formalin time: 2:38 PM on 12/01/2021 Tissue fragment(s): Multiple Size: Aggregate, 19.5 x 2.5 x 0.7 cm Description: Received in a white me sh collection bag are fragments tan soft tissue and blood clot. Entirely submitted in cassettes 1-9.  RB 12/02/2021  Final Diagnosis performed by Betsy Pries, MD.   Electronically signed 12/04/2021 8:45:59AM The electronic signature indicates that the named Attending Pathologist has evaluated the specimen Technical component performed at [facility], [address], [place], [place] [address] Lab: [phone] Dir: Rush Farmer, MD, MMM  Professional component performed at [facility], [place], [place], [place] [address] Lab: [phone] Dir: Kathi Simpers, MD     Assessment:   Patient s/p Hysteroscopy D&C with removal of endometrial mass Uterine cancer (based on biopsy), with MMMT H/o right breast cancer, s/p lumpectomy and radiation therapy Lung nodules Anemia, chronic  Plan:   1. Continue any current medications as  instructed by provider.  2. Wound care discussed. 3. Operative findings again reviewed. Pathology report discussed in detail. Referral made to GYN Oncology for further discussion of diagnosis and management. Patient concerned regarding if lung nodules  are more concerning with new diagnosis.  Has been followed for several years with no growth of the nodules.  4. Activity restrictions:  no heavy lifting, excessive bending/squatting activity  5. Anticipated return to work: not applicable. 6. Notes that she has an appointment with Oncology for her breast in May (is establishing care at [facility], has previously been seen in [place] but her doctor retired).  7. Anemia, chronic, currently on iron supplementation. Likely new acute anemia secondary to recent PMB episode which prompted further evaluation. Tends to run at Hgb of 11 on supplements. 8. Follow up: with GYN Oncology, referral placed.     Rubie Maid, MD [facility]

## 2021-12-08 NOTE — Patient Instructions (Signed)
Uterine Cancer Uterine cancer is an abnormal growth of cancer tissue in the womb (uterus). Uterine cancer can spread to other parts of the body. Uterine cancer usually occurs after a woman stops having menstrual periods (menopause). However, it may also occur around the time that menopause starts. The wall of the uterus has an inner layer of tissue called the endometrium and an outer layer of muscle tissue called the myometrium. The most common type of uterine cancer is endometrial cancer, and it starts in the endometrium. Cancer that starts in the myometrium (uterine sarcoma) is very rare. What are the causes? The exact cause of this condition is not known. What increases the risk? You are more likely to develop this condition if you: Are older than 50. Have a thicker than normal endometrium (endometrial hyperplasia). Have never been pregnant or cannot have children. Started menstruating when you were younger than 82 years old or are older than 2 and having menstrual periods. Have a history of cancer, including: Cancer of the ovaries, or cancer of the intestines, colon, or rectum (colorectal cancer). A family history of uterine cancer or hereditary nonpolyposis colon cancer. Have a long-term (chronic) condition, such as: Diabetes. High blood pressure. Thyroid disease. Gallbladder disease. Obesity. A personal history of enlarged ovaries with small cysts (polycystic ovarian syndrome). Have been exposed to: Radiation. Cigarette smoke. Hormone therapy or a medicine called tamoxifen. What are the signs or symptoms? Symptoms of this condition include: Abnormal vaginal bleeding or discharge. Bleeding may start as a watery, blood-streaked flow that gradually contains more blood. This is the most common symptom. If you experience abnormal vaginal bleeding, do not assume that it is part of menopause. Vaginal bleeding after menopause. Unexplained weight loss. Bleeding between periods. Urination  that is difficult, painful, or more frequent than usual. A lump in the vagina. Pain, bloating, fullness in the abdomen, or pelvic pain. You may also have pain during sex. How is this diagnosed? This condition may be diagnosed based on your medical history, symptoms, a physical exam, and a pelvic exam. During the pelvic exam, your health care provider will feel your pelvis for any growths, as well as for any enlarged lymph nodes. You may also have tests, including: Blood and urine tests. Imaging tests, such as X-rays, CT scans, ultrasound, or MRIs. Hysteroscopy. This involves inserting a thin, flexible tube with a light and camera on the end through the vagina to look inside the uterus. A Pap test to check for abnormal cells in the lower part of the uterus (cervix) and the upper vagina. Removal of a tissue sample (biopsy) from the uterine lining. The sample will be examined in a lab to check for cancer cells. Dilation and curettage (D&C). This procedure involves stretching the cervix and scraping the lining of the uterus to get a tissue sample to check for cancer cells. The cancer is staged to determine its severity and extent. Staging is an assessment of the size of the tumor and if, or where, the cancer has spread. The stages of uterine cancer are: Stage I. The cancer is only in the uterus. Stage II. The cancer has spread to the cervix. Stage III. The cancer has spread outside the uterus, but not outside the pelvis. The cancer may have spread to the lymph nodes in the pelvis. Lymph nodes are part of your body's disease-fighting (immune) system. Stage IV. The cancer has spread to other parts of the body, such as the bladder, rectum, and other organs. How is this  treated? This condition is often treated with surgery to remove: The uterus, cervix, fallopian tubes, and ovaries (total hysterectomy). The uterus and cervix (simple hysterectomy). The type of hysterectomy you will have depends on the  extent of your cancer. Lymph nodes near the uterus may also be removed. Treatment may include one or more of the following: Chemotherapy. These are medicines to kill cancer cells or to slow their growth. Chemotherapy also kills other normal, healthy cells, including blood-forming cells in the bone marrow, hair follicles, and cells in the mouth, digestive tract, and reproductive system. Radiation therapy. This uses high-energy rays to kill cancer cells and prevent their spread. Chemoradiation. This treatment alternates chemotherapy with radiation treatments to enhance the effects of radiation. Brachytherapy. This involves placing small radioactive capsules inside the body where the cancer was removed. Hormone therapy. This includes taking medicines that lower the levels of estrogen in the body. Immunotherapy. These medicines help your immune system fight cancer. Targeted therapy. These medicines specifically kill cancer cells. Follow these instructions at home: Medicines Take over-the-counter and prescription medicines only as told by your health care provider. Ask your health care provider if the medicine prescribed to you requires you to avoid driving or using machinery. Activity Return to your normal activities as told by your health care provider. Ask your health care provider what activities are safe for you. Get regular exercise. Aim for 30 minutes of moderate-intensity activity 5 times a week. Examples of moderate-intensity activity include walking and yoga. Be sure to talk with your health care provider before starting any exercise routine. Lifestyle Eat a healthy diet. A healthy diet includes a lot of fruits and vegetables, low-fat dairy products, lean meats, and fiber. Do not use any products that contain nicotine or tobacco. These products include cigarettes, chewing tobacco, and vaping devices, such as e-cigarettes. If you need help quitting, ask your health care provider. Consider  joining a support group to help you cope with stress. Your health care provider may be able to recommend a local or online support group. General instructions Drink enough fluid to keep your urine pale yellow. Do not have sex, douche, or place anything in your vagina, such as a tampon or diaphragm, until your health care provider says that it is safe. Work with your health care provider to: Manage any chronic conditions you have. Manage any side effects of your treatment. Keep all follow-up visits. This is important. Where to find more information American Cancer Society: www.cancer.[place]: www.cancer.gov Contact a health care provider if: You have pain in your pelvis or abdomen that gets worse. You cannot urinate. You have abnormal vaginal bleeding. You have a fever. Get help right away if: You develop sudden or new severe symptoms, such as: Heavy bleeding. Severe weakness. Pain that is severe or does not get better with medicine. Summary Uterine cancer is an abnormal growth of cancer tissue in the uterus. The most common type of uterine cancer starts in the endometrium and is called endometrial cancer. This condition is often treated with surgery to remove the uterus, cervix, fallopian tubes, and ovaries (total hysterectomy) or the uterus and cervix (simple hysterectomy). Work with your health care provider to manage any long-term (chronic) conditions you have, such as diabetes, high blood pressure, thyroid disease, or gallbladder disease. Consider joining a support group to help you cope with stress. Your health care provider may be able to recommend a local or online support group. This information is not intended to replace advice  given to you by your health care provider. Make sure you discuss any questions you have with your health care provider. Document Revised: 05/28/2020 Document Reviewed: 05/28/2020 Elsevier Patient Education  [place].

## 2021-12-09 ENCOUNTER — Telehealth: Payer: Self-pay

## 2021-12-09 NOTE — Telephone Encounter (Signed)
Received referral from Dr. Marcelline Mates. Pathology reviewed. Called and arranged appointment with gyn oncology for 2/15 at 1500. Went over what to expect during consult and provided directions to the cancer center.

## 2021-12-10 ENCOUNTER — Inpatient Hospital Stay: Payer: PPO | Attending: Obstetrics and Gynecology | Admitting: Obstetrics and Gynecology

## 2021-12-10 ENCOUNTER — Other Ambulatory Visit: Payer: Self-pay

## 2021-12-10 VITALS — BP 148/66 | HR 79 | Temp 98.0°F | Resp 18 | Wt 102.0 lb

## 2021-12-10 DIAGNOSIS — R630 Anorexia: Secondary | ICD-10-CM | POA: Diagnosis not present

## 2021-12-10 DIAGNOSIS — Z79899 Other long term (current) drug therapy: Secondary | ICD-10-CM | POA: Diagnosis not present

## 2021-12-10 DIAGNOSIS — R1030 Lower abdominal pain, unspecified: Secondary | ICD-10-CM | POA: Insufficient documentation

## 2021-12-10 DIAGNOSIS — Z791 Long term (current) use of non-steroidal anti-inflammatories (NSAID): Secondary | ICD-10-CM | POA: Insufficient documentation

## 2021-12-10 DIAGNOSIS — R9431 Abnormal electrocardiogram [ECG] [EKG]: Secondary | ICD-10-CM | POA: Insufficient documentation

## 2021-12-10 DIAGNOSIS — J8410 Pulmonary fibrosis, unspecified: Secondary | ICD-10-CM | POA: Insufficient documentation

## 2021-12-10 DIAGNOSIS — E538 Deficiency of other specified B group vitamins: Secondary | ICD-10-CM | POA: Diagnosis not present

## 2021-12-10 DIAGNOSIS — Z7189 Other specified counseling: Secondary | ICD-10-CM

## 2021-12-10 DIAGNOSIS — I7300 Raynaud's syndrome without gangrene: Secondary | ICD-10-CM | POA: Insufficient documentation

## 2021-12-10 DIAGNOSIS — E785 Hyperlipidemia, unspecified: Secondary | ICD-10-CM | POA: Insufficient documentation

## 2021-12-10 DIAGNOSIS — K219 Gastro-esophageal reflux disease without esophagitis: Secondary | ICD-10-CM | POA: Diagnosis not present

## 2021-12-10 DIAGNOSIS — D0510 Intraductal carcinoma in situ of unspecified breast: Secondary | ICD-10-CM | POA: Diagnosis not present

## 2021-12-10 DIAGNOSIS — J470 Bronchiectasis with acute lower respiratory infection: Secondary | ICD-10-CM | POA: Insufficient documentation

## 2021-12-10 DIAGNOSIS — N814 Uterovaginal prolapse, unspecified: Secondary | ICD-10-CM | POA: Diagnosis not present

## 2021-12-10 DIAGNOSIS — R54 Age-related physical debility: Secondary | ICD-10-CM | POA: Diagnosis not present

## 2021-12-10 DIAGNOSIS — C541 Malignant neoplasm of endometrium: Secondary | ICD-10-CM | POA: Diagnosis not present

## 2021-12-10 NOTE — Progress Notes (Addendum)
Gynecologic Oncology Consult Visit   Referring Provider: Dr. Marcelline Mates  Chief Complaint: Carcinosarcoma  Subjective:  Elizabeth Friedman is a 81 y.o. female who is seen in consultation from Dr. Marcelline Mates for lower pelvic pain x 1 month. She was initially seen by GI but pain persisted and she was referred to gynecology. She has hx of Gehrung pessary/permanent pessary (20 years).   US Pelvis  The uterus is anteverted and measures 9.6 X 7.1 X 5.5 cm. Echo texture is heterogenous with evidence of focal mass in the Endometrium.   The Endometrium measures 35 mm.  There is an echogenic,  mixed echogenicity mass in the endometrial canal ,measuring: 3.7 x 4.1 x 3.3 cm; This is hyperemic and has a small amount of fluid surrounding it within the endometrial cavity.   Right Ovary is not visualized. Left Ovary measures 2.8 x  2.7 x 1.5 cm. It is normal in appearance. Survey of the adnexae demonstrates no adnexal masses. There is no free fluid in the cul de sac.  She began having vaginal spotting and brown discharge. Abdominal pain increased.   Underwent hysteroscopy with removal of endometrial mass and D&C.   DIAGNOSIS: A. ENDOMETRIUM; CURETTAGE: - CARCINOSARCOMA / MALIGNANT MIXED MULLERIAN TUMOR.   B. ENDOMETRIAL MASS; CURETTAGE: - CARCINOSARCOMA / MALIGNANT MIXED MULLERIAN TUMOR.  PMH significant for DCIS of right breast s/p lumpectomy and radiation in 2021. Patient previously followed by Dr. Jana Hakim at [facility] at [facility] but has transferred care to Dr. Rogue Bussing at [facility] and next appointment in May 2023.   She complains of persistent of GERD-like symptoms and epigastric discomfort since her hysteroscopy. Incomplete emptying of her bladder. She wears a pessary that has not been removed in years.   Problem List: Patient Active Problem List   Diagnosis Date Noted   Lung granuloma ([place]) 12/10/2021   B12 deficiency 09/25/2021   History of ductal carcinoma in situ (DCIS) of breast 09/10/2020   Iron  deficiency anemia 09/10/2020   Ductal carcinoma in situ (DCIS) of right breast 05/17/2020   Bilateral carotid artery stenosis 02/06/2020   Visual changes 01/12/2020   Carotid stenosis 01/11/2020   Migraine aura without headache 03/07/2019   Complete tear of left rotator cuff 03/10/2017   Pessary maintenance 12/18/2016   Rectocele 12/18/2016   Midline cystocele 12/18/2016   Menopausal syndrome on hormone replacement therapy 12/18/2016   Allergic rhinitis 12/17/2015   Atypical chest pain 12/17/2015   Bloodgood disease 12/17/2015   Acid reflux 12/17/2015   HLD (hyperlipidemia) 12/17/2015   Lung mass 12/17/2015   Uterine prolapse 12/17/2015   CA of skin 12/17/2015   Post menopausal syndrome 01/14/2015   Incomplete bladder emptying 01/14/2015   Neuralgia neuritis, sciatic nerve 01/14/2015   H/O malignant neoplasm of skin 09/17/2014    Past Medical History: Past Medical History:  Diagnosis Date   Allergic rhinitis    Anemia    Anginal pain (HCC)    Arthritis    Atypical chest pain    Breast cancer ([place])    Cancer ([place])    skin   Fibrocystic breast disease    GERD (gastroesophageal reflux disease)    Hearing aid worn    bilateral   History of hiatal hernia    Hyperlipidemia    Lung nodule    Menopause    Migraine    Migraine    optical migraines   Motion sickness    Plantar fasciitis    Prolapse of female pelvic organs  hodge pessary   Raynaud's disease    Sciatic leg pain    Sciatica    Urinary retention with incomplete bladder emptying    Vitreous detachment of both eyes     Past Surgical History: Past Surgical History:  Procedure Laterality Date   APPENDECTOMY     BREAST LUMPECTOMY WITH RADIOACTIVE SEED LOCALIZATION Right 05/29/2020   Procedure: RIGHT BREAST LUMPECTOMY WITH RADIOACTIVE SEED LOCALIZATION;  Surgeon: Rolm Bookbinder, MD;  Location: [place];  Service: General;  Laterality: Right;   CARDIAC CATHETERIZATION     CATARACT  EXTRACTION W/PHACO Right 02/26/2021   Procedure: CATARACT EXTRACTION PHACO AND INTRAOCULAR LENS PLACEMENT ([facility]) RIGHT 5.86 01:19.6 7.4%;  Surgeon: Leandrew Koyanagi, MD;  Location: [place];  Service: Ophthalmology;  Laterality: Right;   CATARACT EXTRACTION W/PHACO Left 09/03/2021   Procedure: CATARACT EXTRACTION PHACO AND INTRAOCULAR LENS PLACEMENT ([place]) LEFT;  Surgeon: Leandrew Koyanagi, MD;  Location: [place];  Service: Ophthalmology;  Laterality: Left;  9.61 01:15.9   COLONOSCOPY WITH PROPOFOL N/A 03/27/2016   Procedure: COLONOSCOPY WITH PROPOFOL;  Surgeon: Manya Silvas, MD;  Location: [facility] ENDOSCOPY;  Service: Endoscopy;  Laterality: N/A;   COLONOSCOPY WITH PROPOFOL N/A 08/14/2020   Procedure: COLONOSCOPY WITH PROPOFOL;  Surgeon: Toledo, Benay Pike, MD;  Location: [facility] ENDOSCOPY;  Service: Gastroenterology;  Laterality: N/A;   DILATATION & CURETTAGE/HYSTEROSCOPY WITH MYOSURE N/A 12/01/2021   Procedure: DILATATION & CURETTAGE/HYSTEROSCOPY WITH MYOSURE;  Surgeon: Rubie Maid, MD;  Location: [facility] ORS;  Service: Gynecology;  Laterality: N/A;   ESOPHAGOGASTRODUODENOSCOPY (EGD) WITH PROPOFOL N/A 08/14/2020   Procedure: ESOPHAGOGASTRODUODENOSCOPY (EGD) WITH PROPOFOL;  Surgeon: Toledo, Benay Pike, MD;  Location: [facility] ENDOSCOPY;  Service: Gastroenterology;  Laterality: N/A;   ESOPHAGOGASTRODUODENOSCOPY (EGD) WITH PROPOFOL N/A 03/21/2021   Procedure: ESOPHAGOGASTRODUODENOSCOPY (EGD) WITH PROPOFOL;  Surgeon: Toledo, Benay Pike, MD;  Location: [facility] ENDOSCOPY;  Service: Gastroenterology;  Laterality: N/A;   SKIN CANCER EXCISION     face and neck    Past Gynecologic History:  As per HPI  OB History:  OB History  Gravida Para Term Preterm AB Living  2 2 2     2   SAB IAB Ectopic Multiple Live Births          2    # Outcome Date GA Lbr Len/2nd Weight Sex Delivery Anes PTL Lv  2 Term 1964    F Vag-Spont   LIV  1 Term 1962    F Vag-Spont   LIV    Family History: Family  History  Problem Relation Age of Onset   Breast cancer Mother    Heart disease Father    Colon cancer Brother    Diabetes Neg Hx    Ovarian cancer Neg Hx     Social History: Social History   Socioeconomic History   Marital status: Widowed    Spouse name: Not on file   Number of children: Not on file   Years of education: Not on file   Highest education level: Not on file  Occupational History   Not on file  Tobacco Use   Smoking status: Never   Smokeless tobacco: Never  Vaping Use   Vaping Use: Never used  Substance and Sexual Activity   Alcohol use: No   Drug use: No   Sexual activity: Not Currently    Birth control/protection: Post-menopausal  Other Topics Concern   Not on file  Social History Narrative   Not on file   Social Determinants of Health  Financial Resource Strain: Not on file  Food Insecurity: Not on file  Transportation Needs: Not on file  Physical Activity: Not on file  Stress: Not on file  Social Connections: Not on file  Intimate Partner Violence: Not on file    Allergies: No Known Allergies  Current Medications: Current Outpatient Medications  Medication Sig Dispense Refill   acetaminophen (TYLENOL) 500 MG tablet Take 1,000 mg by mouth every 6 (six) hours as needed for moderate pain or mild pain.     alum & mag hydroxide-simeth (MAALOX/MYLANTA) 200-200-20 MG/5ML suspension Take 30 mLs by mouth every 6 (six) hours as needed for indigestion or heartburn.     Calcium-Vitamin D (CALTRATE 600 PLUS-VIT D PO) Take 1 tablet by mouth daily.     celecoxib (CELEBREX) 200 MG capsule Take 200 mg by mouth 2 (two) times daily.     Cholecalciferol (VITAMIN D) 125 MCG (5000 UT) CAPS Take 5,000 Units by mouth daily.     Coenzyme Q10 200 MG TABS Take 200 mg by mouth daily.     Cyanocobalamin (VITAMIN B-12 PO) Take 1 tablet by mouth daily.     gabapentin (NEURONTIN) 100 MG capsule Take 100 mg by mouth 4 (four) times daily.     ibuprofen (ADVIL) 600 MG  tablet Take 1 tablet (600 mg total) by mouth every 6 (six) hours as needed. 30 tablet 0   ILEVRO 0.3 % ophthalmic suspension      Iron-Vitamin C (VITRON-C) 65-125 MG TABS Take 1 tablet by mouth daily.     Omega-3 Fatty Acids (FISH OIL PO) Take 1,400 mg by mouth daily.     pantoprazole (PROTONIX) 40 MG tablet Take 40 mg by mouth every morning.     RESTASIS 0.05 % ophthalmic emulsion Place 1 drop into both eyes 2 (two) times daily.     simvastatin (ZOCOR) 10 MG tablet Take 10 mg by mouth at bedtime.     topiramate (TOPAMAX) 50 MG tablet Take 50 mg by mouth at bedtime.     traMADol (ULTRAM) 50 MG tablet Take 1-2 tablets (50-100 mg total) by mouth every 6 (six) hours as needed. 20 tablet 0   Triamcinolone Acetonide (NASACORT ALLERGY 24HR NA) Place 1 spray into the nose daily as needed (sinus).     VIGAMOX 0.5 % ophthalmic solution      vitamin E 180 MG (400 UNITS) capsule Take 400 Units by mouth daily.     No current facility-administered medications for this visit.   General: negative for fevers, changes in weight or night sweats Skin: negative for changes in moles or sores or rash Eyes: negative for changes in vision HEENT: hearing aids; negative for change in hearing, tinnitus, voice changes Pulmonary: negative for dyspnea, orthopnea, productive cough, wheezing Cardiac: negative for palpitations, pain Gastrointestinal: abdominal pain and poor appetite, she attributes to a medication she is taking; negative for nausea, vomiting, constipation, diarrhea, hematemesis, hematochezia Genitourinary/Sexual:  urinary incontinence o/w negative for dysuria, retention, hematuria, Ob/Gyn:  negative for abnormal bleeding, or pain Musculoskeletal: joint pain,negative for back pain Hematology: negative for easy bruising, abnormal bleeding Neurologic/Psych: negative for headaches, seizures, paralysis, weakness, numbness  Objective:  Physical Examination:  BP (!) 148/66 (BP Location: Left Arm, Patient  Position: Sitting)    Pulse 79    Temp 98 F (36.7 C) (Tympanic)    Resp 18    Wt 102 lb (46.3 kg)    SpO2 99%    BMI 18.66 kg/m     ECOG  Performance Status: 0  GENERAL: Patient is a well appearing female in no acute distress HEENT:  PERRL, neck supple with midline trachea. Thyroid without masses.  NODES:  No cervical, supraclavicular, axillary, or inguinal lymphadenopathy palpated.  LUNGS:  Clear to auscultation bilaterally.   HEART:  Regular rate and rhythm.  ABDOMEN:  Soft, nontender, nondistended. No masses/ascites/hepatomegaly  MSK:  No focal spinal tenderness to palpation. Full range of motion bilaterally in the upper extremities. EXTREMITIES:  No peripheral edema.   SKIN:  Clear with no obvious rashes or skin changes. No nail dyscrasia. NEURO:  Nonfocal. Well oriented.  Appropriate affect.  Pelvic: Exam chaperoned by Nursing EGBUS: no lesions Cervix: no lesions, nontender, mobile deviated to patient's left Vagina: no lesions, no discharge or bleeding Uterus: above limits normal size ~10 cm globular anteverted, slightly tender, mobile Adnexa: no palpable masses Rectovaginal: confirmatory  Lab Review Labs on site today: Lab Results  Component Value Date   WBC 7.4 11/28/2021   HGB 9.3 (L) 11/28/2021   HCT 28.3 (L) 11/28/2021   MCV 92.2 11/28/2021   PLT 277 11/28/2021     Chemistry      Component Value Date/Time   NA 135 05/22/2020 0804   K 4.0 05/22/2020 0804   CL 103 05/22/2020 0804   CO2 25 05/22/2020 0804   BUN 16 05/22/2020 0804   CREATININE 1.03 (H) 05/22/2020 0804      Component Value Date/Time   CALCIUM 10.0 05/22/2020 0804   ALKPHOS 57 05/22/2020 0804   AST 17 05/22/2020 0804   ALT 11 05/22/2020 0804   BILITOT 0.3 05/22/2020 0804     09/22/2021  Vitamin B12 low 238 Ferritin and iron normal  TSH 3.007 WNL   Radiologic Imaging: As per HPI    Assessment:  Elizabeth Friedman is a 81 y.o. female diagnosed with carcinosarcoma of the uterus.    Abdominal pain and fatigue are concerning.   Pelvic organ prolapse, managed with pessary.   Vitamin B12 deficiency, s/p supplementation   Medical co-morbidities complicating care: Frailty Plan:   Problem List Items Addressed This Visit   None Visit Diagnoses     Endometrial cancer ([place])    -  Primary   Relevant Orders   CT CHEST ABDOMEN PELVIS W CONTRAST   Counseling and coordination of care       Lower abdominal pain           We discussed recommendations. At his point I recommended obtaining a CT scan C/A/P to assess for metastatic disease. She will come back next week to discuss these results and our final recommendations.   Confirm with PCP that she is getting adequate supplementation.   Discuss pessary with Dr. Marcelline Mates; she may benefit from Urogynecologic evaluation before surgery to assess if there are any surgical recommendations and if a combined procedure may be warranted.   Suggested return to clinic in  1 weeks.    The patient's diagnosis, an outline of the further diagnostic and laboratory studies which will be required, the recommendation for surgery, and alternatives were discussed with her and her accompanying family members.  All questions were answered to their satisfaction.  I personally had a face to face interaction and evaluated the patient jointly with the NP, Ms. [name].  I have reviewed her history and available records and have performed the key portions of the physical exam including lymph node survey, abdominal exam, pelvic exam with my findings confirming those documented above by the APP.  I have discussed the case with the APP and the patient.  I agree with the above documentation, assessment and plan which was fully formulated by me.  Counseling was completed by me.   I personally saw the patient and performed a substantive portion of this encounter in conjunction with the listed APP as documented above.  [place] Gaetana Michaelis,  MD      CC:  Dr. Rubie Maid

## 2021-12-11 ENCOUNTER — Ambulatory Visit
Admission: RE | Admit: 2021-12-11 | Discharge: 2021-12-11 | Disposition: A | Discharge status: Home / Self Care | Payer: PPO | Source: Ambulatory Visit | Attending: Nurse Practitioner | Admitting: Nurse Practitioner

## 2021-12-11 ENCOUNTER — Other Ambulatory Visit: Payer: Self-pay

## 2021-12-11 ENCOUNTER — Telehealth: Payer: Self-pay

## 2021-12-11 DIAGNOSIS — N814 Uterovaginal prolapse, unspecified: Secondary | ICD-10-CM

## 2021-12-11 DIAGNOSIS — D7389 Other diseases of spleen: Secondary | ICD-10-CM | POA: Diagnosis not present

## 2021-12-11 DIAGNOSIS — C541 Malignant neoplasm of endometrium: Secondary | ICD-10-CM

## 2021-12-11 DIAGNOSIS — N816 Rectocele: Secondary | ICD-10-CM

## 2021-12-11 DIAGNOSIS — M4316 Spondylolisthesis, lumbar region: Secondary | ICD-10-CM | POA: Diagnosis not present

## 2021-12-11 DIAGNOSIS — C55 Malignant neoplasm of uterus, part unspecified: Secondary | ICD-10-CM | POA: Diagnosis not present

## 2021-12-11 DIAGNOSIS — J479 Bronchiectasis, uncomplicated: Secondary | ICD-10-CM | POA: Diagnosis not present

## 2021-12-11 DIAGNOSIS — K3189 Other diseases of stomach and duodenum: Secondary | ICD-10-CM | POA: Diagnosis not present

## 2021-12-11 DIAGNOSIS — N858 Other specified noninflammatory disorders of uterus: Secondary | ICD-10-CM | POA: Diagnosis not present

## 2021-12-11 DIAGNOSIS — I2510 Atherosclerotic heart disease of native coronary artery without angina pectoris: Secondary | ICD-10-CM | POA: Diagnosis not present

## 2021-12-11 DIAGNOSIS — J8410 Pulmonary fibrosis, unspecified: Secondary | ICD-10-CM | POA: Diagnosis not present

## 2021-12-11 LAB — POCT I-STAT CREATININE: Creatinine, Ser: 0.9 mg/dL (ref 0.44–1.00)

## 2021-12-11 MED ORDER — IOHEXOL 300 MG/ML  SOLN
75.0000 mL | Freq: Once | INTRAMUSCULAR | Status: AC | PRN
  Administered 2021-12-11: 75 mL via INTRAVENOUS

## 2021-12-11 NOTE — Telephone Encounter (Signed)
At request of Dr. Theora Gianotti called and spoke with Elizabeth Friedman regarding a referral to Urogynecology for her pelvic organ prolaspe with the possibility of a combined surgery. She is agreeable to consult and referral sent.

## 2021-12-12 DIAGNOSIS — J479 Bronchiectasis, uncomplicated: Secondary | ICD-10-CM | POA: Insufficient documentation

## 2021-12-17 ENCOUNTER — Inpatient Hospital Stay (HOSPITAL_BASED_OUTPATIENT_CLINIC_OR_DEPARTMENT_OTHER): Payer: PPO | Admitting: Obstetrics and Gynecology

## 2021-12-17 ENCOUNTER — Other Ambulatory Visit: Payer: Self-pay

## 2021-12-17 DIAGNOSIS — C541 Malignant neoplasm of endometrium: Secondary | ICD-10-CM

## 2021-12-17 DIAGNOSIS — Z7189 Other specified counseling: Secondary | ICD-10-CM | POA: Diagnosis not present

## 2021-12-17 NOTE — Patient Instructions (Addendum)
You will be scheduled an appointment with Pre-admit testing. I will call you with this appointment.   DIVISION OF GYNECOLOGIC ONCOLOGY BOWEL PREP   The following instructions are extremely important to prepare for your surgery. Please follow them carefully   Step 1: Liquid Diet Instructions              Clear Liquid Diet for GYN Oncology Patients Day Before Surgery The day before your scheduled surgery DO NOT EAT any solid foods.  We do want you to drink enough liquids, but NO MILK products.  We do not want you to be dehydrated.  Clear liquids are defined as no milk products and no pieces of any solid food. Drink at least 64 oz. of fluid.  The following are all approved for you to drink the day before you surgery. Chicken, Beef or Vegetable Broth (bouillon or consomm) - NO BROTH AFTER MIDNIGHT Plain Jello  (no fruit) Water Strained lemonade or fruit punch Gatorade (any flavor) CLEAR Ensure or Boost Breeze Fruit juices without pulp, such as apple, grape, or cranberry juice Clear sodas - NO SODA AFTER MIDNIGHT Ice Pops without bits of fruit or fruit pulp Honey Tea or coffee without milk or cream                 Any foods not on the above list should be avoided                                                                                             Step 2: Laxatives           The evening before surgery:   Time: around 5pm   Follow these instructions carefully.   Administer 1 Dulcolax suppository according to manufacturer instructions on the box. You will need to purchase this laxative at a pharmacy or grocery store.    Individual responses to laxatives vary; this prep may cause multiple bowel movements. It often works in 30 minutes and may take as long as 3 hours. Stay near an available bathroom.    It is important to stay hydrated. Ensure you are still drinking clear liquids.     IMPORTANT: FOR YOUR SAFETY, WE WILL HAVE TO CANCEL YOUR SURGERY IF YOU DO NOT FOLLOW  THESE INSTRUCTIONS.   Do not eat anything after midnight (including gum or candy) prior to your surgery. Avoid drinking carbonated beverages after midnight. You can have clear liquids up until one hour before you arrive at the hospital. Nothing by mouth means no liquids, gum, candy, etc for one hour before your arrival time.      Laparoscopy Laparoscopy is a procedure to diagnose diseases in the abdomen. During the procedure, a thin, lighted, pencil-sized instrument called a laparoscope is inserted into the abdomen through an incision. The laparoscope allows your health care provider to look at the organs inside your body. LET [facility] CARE PROVIDER KNOW ABOUT: Any allergies you have. All medicines you are taking, including vitamins, herbs, eye drops, creams, and over-the-counter medicines. Previous problems you or members of your family have had with the use of anesthetics. Any blood  disorders you have. Previous surgeries you have had. Medical conditions you have. RISKS AND COMPLICATIONS  Generally, this is a safe procedure. However, problems can occur, which may include: Infection. Bleeding. Damage to other organs. Allergic reaction to the anesthetics used during the procedure. BEFORE THE PROCEDURE Do not eat or drink anything after midnight on the night before the procedure or as directed by your health care provider. Ask your health care provider about: Changing or stopping your regular medicines. Taking medicines such as aspirin and ibuprofen. These medicines can thin your blood. Do not take these medicines before your procedure if your health care provider instructs you not to. Plan to have someone take you home after the procedure. PROCEDURE You may be given a medicine to help you relax (sedative). You will be given a medicine to make you sleep (general anesthetic). Your abdomen will be inflated with a gas. This will make your organs easier to see. Small incisions will be  made in your abdomen. A laparoscope and other small instruments will be inserted into the abdomen through the incisions. A tissue sample may be removed from an organ in the abdomen for examination. The instruments will be removed from the abdomen. The gas will be released. The incisions will be closed with stitches (sutures). AFTER THE PROCEDURE  Your blood pressure, heart rate, breathing rate, and blood oxygen level will be monitored often until the medicines you were given have worn off.   This information is not intended to replace advice given to you by your health care provider. Make sure you discuss any questions you have with your health care provider.                                             Bowel Symptoms After Surgery After gynecologic surgery, women often have temporary changes in bowel function (constipation and gas pain).  Following are tips to help prevent and treat common bowel problems.  It also tells you when to call the doctor.  This is important because some symptoms might be a sign of a more serious bowel problem such as obstruction (bowel blockage).  These problems are rare but can happen after gynecologic surgery.   Besides surgery, what can temporarily affect bowel function? 1. Dietary changes   2. Decreased physical activity   3.Antibiotics   4. Pain medication   How can I prevent constipation (three days or more without a stool)? Include fiber in your diet: whole grains, raw or dried fruits & vegetables, prunes, prune/pear juiceDrink at least 8 glasses of liquid (preferably water) every day Avoid: Gas forming foods such as broccoli, beans, peas, salads, cabbage, sweet potatoes Greasy, fatty, or fried foods Activity helps bowel function return to normal, walk around the house at least 3-4 times each day for 15 minutes or longer, if tolerated.  Rocking in a rocking chair is preferable to sitting still. Stool softeners: these are not laxatives, but serve to soften  the stool to avoid straining.  Take 2-4 times a day until normal bowel function returns         Examples: Colace or generic equivalent (Docusate) Bulk laxatives: provide a concentrated source of fiber.  They do not stimulate the bowel.  Take 1-2 times each day until normal bowel function return.              Examples: Citrucel, Metamucil,  Fiberal, Fibercon   What can I take for Gas Pains? Simethicone (Mylicon, Gas-X, Maalox-Gas, Mylanta-Gas) take 3-4 times a day Maalox Regular - take 3-4 times a day Mylanta Regular - take 3-4 times a day   What can I take if I become constipated? Start with stool softeners and add additional laxatives below as needed to have a bowel movement every 1-2 days  Stool softeners 1-2 tablets, 2 times a day Senokot 1-2 tablets, 1-2 times a day Glycerin suppository can soften hard stool take once a day Bisacodyl suppository once a day  Milk of Magnesia 30 mL 1-2 times a day Fleets or tap water enema    What can I do for nausea?  Limit most solid foods for 24-48 hours Continue eating small frequent amounts of liquids and/or bland soft foods Toast, crackers, cooked cereal (grits, cream of wheat, rice) Benadryl: a mild anti-nausea medicine can be obtained without a prescription. May cause drowsiness, especially if taken with narcotic pain medicines Contact provider for prescription nausea medication     What can I do, or take for diarrhea (more than five loose stools per day)? Drink plenty of clear fluids to prevent dehydration May take Kaopectate, Pepto-Bismol, Imodium, or probiotics for 1-2 days Anusol or Preparation-H can be helpful for hemorrhoids and irritated tissue around anus   When should I call the doctor?             CONSTIPATION:  Not relieved after three days following the above program VOMITING: That contains blood, coffee ground material More the three times/hour and unable to keep down nausea medication for more than eight hours With dry  mouth, dark or strong urine, feeling light-headed, dizzy, or confused With severe abdominal pain or bloating for more than 24 hours DIARRHEA: That continues for more then 24-48 hours despite treatment That contains blood or tarry material With dry mouth, dark or strong urine, feeling light~headed, dizzy, or confused FEVER: 101 F or higher along with nausea, vomiting, gas pain, diarrhea UNABLE TO: Pass gas from rectum for more than 24 hours Tolerate liquids by mouth for more than 24 hours        Laparoscopic Hysterectomy, Care After Refer to this sheet in the next few weeks. These instructions provide you with information on caring for yourself after your procedure. Your health care provider may also give you more specific instructions. Your treatment has been planned according to current medical practices, but problems sometimes occur. Call your health care provider if you have any problems or questions after your procedure. What can I expect after the procedure? Pain and bruising at the incision sites. You will be given pain medicine to control it. Menopausal symptoms such as hot flashes, night sweats, and insomnia if your ovaries were removed. Sore throat from the breathing tube that was inserted during surgery. Follow these instructions at home: Only take over-the-counter or prescription medicines for pain, discomfort, or fever as directed by your health care provider. Do not take aspirin. It can cause bleeding. Do not drive when taking pain medicine. Follow your health care provider's advice regarding diet, exercise, lifting, driving, and general activities. Resume your usual diet as directed and allowed. Get plenty of rest and sleep. Do not douche, use tampons, or have sexual intercourse for at least 6 weeks, or until your health care provider gives you permission. Change your bandages (dressings) as directed by your health care provider. Monitor your temperature and notify your  health care provider of a fever. Take  showers instead of baths for 2-3 weeks. Do not drink alcohol until your health care provider gives you permission. If you develop constipation, you may take a mild laxative with your health care provider's permission. Bran foods may help with constipation problems. Drinking enough fluids to keep your urine clear or pale yellow may help as well. Try to have someone home with you for 1-2 weeks to help around the house. Keep all of your follow-up appointments as directed by your health care provider. Contact a health care provider if: You have swelling, redness, or increasing pain around your incision sites. You have pus coming from your incision. You notice a bad smell coming from your incision. Your incision breaks open. You feel dizzy or lightheaded. You have pain or bleeding when you urinate. You have persistent diarrhea. You have persistent nausea and vomiting. You have abnormal vaginal discharge. You have a rash. You have any type of abnormal reaction or develop an allergy to your medicine. You have poor pain control with your prescribed medicine. Get help right away if: You have chest pain or shortness of breath. You have severe abdominal pain that is not relieved with pain medicine. You have pain or swelling in your legs. This information is not intended to replace advice given to you by your health care provider. Make sure you discuss any questions you have with your health care provider. Document Released: 08/02/2013 Document Revised: 03/19/2016 Document Reviewed: 05/02/2013 [employer] Interactive Patient Education  2017 [employer].

## 2021-12-17 NOTE — Progress Notes (Signed)
Gynecologic Oncology Interval Visit   Referring Provider: Dr. Marcelline Mates  Chief Complaint: Carcinosarcoma  Subjective:  AMBYR QADRI is a 81 y.o. female who is seen in consultation from Dr. Marcelline Mates for lower pelvic pain x 1 month and uterine carcinosarcoma. She was initially seen by GI but pain persisted and she was referred to gynecology. She has hx of Gehrung pessary/permanent pessary (20 years).   She presents to discuss her CT scan which is reassuring. She has an UroGyn appt tomorrow with Dr. Wannetta Sender regarding her pessary.   Her PCP recommended a pulmonary consultation given the CT pulmonary findings.   She had an abnormal EKG on 11/28/2021 that showed PVS, nonspecific ST and T wave abnormalities.   Gynecologic Oncology History  JOURNEI THOMASSEN is a pleasant female who is seen in consultation from Dr. Marcelline Mates for lower pelvic pain x 1 month and uterine carcinosarcoma.   US Pelvis  The uterus is anteverted and measures 9.6 X 7.1 X 5.5 cm. Echo texture is heterogenous with evidence of focal mass in the Endometrium.   The Endometrium measures 35 mm.  There is an echogenic,  mixed echogenicity mass in the endometrial canal ,measuring: 3.7 x 4.1 x 3.3 cm; This is hyperemic and has a small amount of fluid surrounding it within the endometrial cavity.   Right Ovary is not visualized. Left Ovary measures 2.8 x  2.7 x 1.5 cm. It is normal in appearance. Survey of the adnexae demonstrates no adnexal masses. There is no free fluid in the cul de sac.  She began having vaginal spotting and brown discharge. Abdominal pain increased.   Underwent hysteroscopy with removal of endometrial mass and D&C.   DIAGNOSIS: A. ENDOMETRIUM; CURETTAGE: - CARCINOSARCOMA / MALIGNANT MIXED MULLERIAN TUMOR.   B. ENDOMETRIAL MASS; CURETTAGE: - CARCINOSARCOMA / MALIGNANT MIXED MULLERIAN TUMOR.  PMH significant for DCIS of right breast s/p lumpectomy and radiation in 2021. Patient previously followed by Dr. Jana Hakim  at [facility] but has transferred care to Dr. Rogue Bussing at [facility] and next appointment in May 2023.   She complains of persistent of GERD-like symptoms and epigastric discomfort since her hysteroscopy. Incomplete emptying of her bladder. She wears a pessary that has not been removed in years.   Problem List: Patient Active Problem List   Diagnosis Date Noted   Lung granuloma ([place]) 12/10/2021   B12 deficiency 09/25/2021   History of ductal carcinoma in situ (DCIS) of breast 09/10/2020   Iron deficiency anemia 09/10/2020   Ductal carcinoma in situ (DCIS) of right breast 05/17/2020   Bilateral carotid artery stenosis 02/06/2020   Visual changes 01/12/2020   Carotid stenosis 01/11/2020   Migraine aura without headache 03/07/2019   Complete tear of left rotator cuff 03/10/2017   Pessary maintenance 12/18/2016   Rectocele 12/18/2016   Midline cystocele 12/18/2016   Menopausal syndrome on hormone replacement therapy 12/18/2016   Allergic rhinitis 12/17/2015   Atypical chest pain 12/17/2015   Bloodgood disease 12/17/2015   Acid reflux 12/17/2015   HLD (hyperlipidemia) 12/17/2015   Lung mass 12/17/2015   Uterine prolapse 12/17/2015   CA of skin 12/17/2015   Post menopausal syndrome 01/14/2015   Incomplete bladder emptying 01/14/2015   Neuralgia neuritis, sciatic nerve 01/14/2015   H/O malignant neoplasm of skin 09/17/2014    Past Medical History: Past Medical History:  Diagnosis Date   Allergic rhinitis    Anemia    Anginal pain (HCC)    Arthritis    Atypical chest  pain    Breast cancer ([place])    Cancer ([place])    skin   Fibrocystic breast disease    GERD (gastroesophageal reflux disease)    Hearing aid worn    bilateral   History of hiatal hernia    Hyperlipidemia    Lung nodule    Menopause    Migraine    Migraine    optical migraines   Motion sickness    Plantar fasciitis    Prolapse of female pelvic organs    hodge pessary   Raynaud's disease    Sciatic  leg pain    Sciatica    Urinary retention with incomplete bladder emptying    Vitreous detachment of both eyes     Past Surgical History: Past Surgical History:  Procedure Laterality Date   APPENDECTOMY     BREAST LUMPECTOMY WITH RADIOACTIVE SEED LOCALIZATION Right 05/29/2020   Procedure: RIGHT BREAST LUMPECTOMY WITH RADIOACTIVE SEED LOCALIZATION;  Surgeon: Rolm Bookbinder, MD;  Location: [place];  Service: General;  Laterality: Right;   CARDIAC CATHETERIZATION     CATARACT EXTRACTION W/PHACO Right 02/26/2021   Procedure: CATARACT EXTRACTION PHACO AND INTRAOCULAR LENS PLACEMENT (I[facility]) RIGHT 5.86 01:19.6 7.4%;  Surgeon: Leandrew Koyanagi, MD;  Location: [place];  Service: Ophthalmology;  Laterality: Right;   CATARACT EXTRACTION W/PHACO Left 09/03/2021   Procedure: CATARACT EXTRACTION PHACO AND INTRAOCULAR LENS PLACEMENT ([place]) LEFT;  Surgeon: Leandrew Koyanagi, MD;  Location: [place];  Service: Ophthalmology;  Laterality: Left;  9.61 01:15.9   COLONOSCOPY WITH PROPOFOL N/A 03/27/2016   Procedure: COLONOSCOPY WITH PROPOFOL;  Surgeon: Manya Silvas, MD;  Location: [facility] ENDOSCOPY;  Service: Endoscopy;  Laterality: N/A;   COLONOSCOPY WITH PROPOFOL N/A 08/14/2020   Procedure: COLONOSCOPY WITH PROPOFOL;  Surgeon: Toledo, Benay Pike, MD;  Location: [facility] ENDOSCOPY;  Service: Gastroenterology;  Laterality: N/A;   DILATATION & CURETTAGE/HYSTEROSCOPY WITH MYOSURE N/A 12/01/2021   Procedure: DILATATION & CURETTAGE/HYSTEROSCOPY WITH MYOSURE;  Surgeon: Rubie Maid, MD;  Location: [facility];  Service: Gynecology;  Laterality: N/A;   ESOPHAGOGASTRODUODENOSCOPY (EGD) WITH PROPOFOL N/A 08/14/2020   Procedure: ESOPHAGOGASTRODUODENOSCOPY (EGD) WITH PROPOFOL;  Surgeon: Toledo, Benay Pike, MD;  Location: [facility] ENDOSCOPY;  Service: Gastroenterology;  Laterality: N/A;   ESOPHAGOGASTRODUODENOSCOPY (EGD) WITH PROPOFOL N/A 03/21/2021   Procedure: ESOPHAGOGASTRODUODENOSCOPY  (EGD) WITH PROPOFOL;  Surgeon: Toledo, Benay Pike, MD;  Location: [facility] ENDOSCOPY;  Service: Gastroenterology;  Laterality: N/A;   SKIN CANCER EXCISION     face and neck    Past Gynecologic History:  As per HPI  OB History:  OB History  Gravida Para Term Preterm AB Living  2 2 2     2   SAB IAB Ectopic Multiple Live Births          2    # Outcome Date GA Lbr Len/2nd Weight Sex Delivery Anes PTL Lv  2 Term 1964    F Vag-Spont   LIV  1 Term 1962    F Vag-Spont   LIV    Family History: Family History  Problem Relation Age of Onset   Breast cancer Mother    Heart disease Father    Colon cancer Brother    Diabetes Neg Hx    Ovarian cancer Neg Hx     Social History: Social History   Socioeconomic History   Marital status: Widowed    Spouse name: Not on file   Number of children: Not on file   Years of education: Not on file  Highest education level: Not on file  Occupational History   Not on file  Tobacco Use   Smoking status: Never   Smokeless tobacco: Never  Vaping Use   Vaping Use: Never used  Substance and Sexual Activity   Alcohol use: No   Drug use: No   Sexual activity: Not Currently    Birth control/protection: Post-menopausal  Other Topics Concern   Not on file  Social History Narrative   Not on file   Social Determinants of Health   Financial Resource Strain: Not on file  Food Insecurity: Not on file  Transportation Needs: Not on file  Physical Activity: Not on file  Stress: Not on file  Social Connections: Not on file  Intimate Partner Violence: Not on file    Allergies: No Known Allergies  Current Medications: Current Outpatient Medications  Medication Sig Dispense Refill   acetaminophen (TYLENOL) 500 MG tablet Take 1,000 mg by mouth every 6 (six) hours as needed for moderate pain or mild pain.     alum & mag hydroxide-simeth (MAALOX/MYLANTA) 200-200-20 MG/5ML suspension Take 30 mLs by mouth every 6 (six) hours as needed for indigestion or  heartburn.     Calcium-Vitamin D (CALTRATE 600 PLUS-VIT D PO) Take 1 tablet by mouth daily.     celecoxib (CELEBREX) 200 MG capsule Take 200 mg by mouth 2 (two) times daily.     Cholecalciferol (VITAMIN D) 125 MCG (5000 UT) CAPS Take 5,000 Units by mouth daily.     Coenzyme Q10 200 MG TABS Take 200 mg by mouth daily.     Cyanocobalamin (VITAMIN B-12 PO) Take 1 tablet by mouth daily.     gabapentin (NEURONTIN) 100 MG capsule Take 100 mg by mouth 4 (four) times daily.     ibuprofen (ADVIL) 600 MG tablet Take 1 tablet (600 mg total) by mouth every 6 (six) hours as needed. 30 tablet 0   ILEVRO 0.3 % ophthalmic suspension      Iron-Vitamin C (VITRON-C) 65-125 MG TABS Take 1 tablet by mouth daily.     Omega-3 Fatty Acids (FISH OIL PO) Take 1,400 mg by mouth daily.     pantoprazole (PROTONIX) 40 MG tablet Take 40 mg by mouth every morning.     RESTASIS 0.05 % ophthalmic emulsion Place 1 drop into both eyes 2 (two) times daily.     simvastatin (ZOCOR) 10 MG tablet Take 10 mg by mouth at bedtime.     topiramate (TOPAMAX) 50 MG tablet Take 50 mg by mouth at bedtime.     traMADol (ULTRAM) 50 MG tablet Take 1-2 tablets (50-100 mg total) by mouth every 6 (six) hours as needed. 20 tablet 0   Triamcinolone Acetonide (NASACORT ALLERGY 24HR NA) Place 1 spray into the nose daily as needed (sinus).     VIGAMOX 0.5 % ophthalmic solution      vitamin E 180 MG (400 UNITS) capsule Take 400 Units by mouth daily.     No current facility-administered medications for this visit.   Review of Systems General: negative for fevers, changes in weight or night sweats Skin: negative for changes in moles or sores or rash Eyes: negative for changes in vision HEENT: hearing aids; negative for change in hearing, tinnitus, voice changes Pulmonary: negative for dyspnea, orthopnea, productive cough, wheezing Cardiac: negative for palpitations, pain Gastrointestinal: abdominal pain and poor appetite, she attributes to a  medication she is taking; negative for nausea, vomiting, constipation, diarrhea, hematemesis, hematochezia Genitourinary/Sexual:  urinary incontinence o/w negative  for dysuria, retention, hematuria, Ob/Gyn:  negative for abnormal bleeding, or pain Musculoskeletal: joint pain,negative for back pain Hematology: negative for easy bruising, abnormal bleeding Neurologic/Psych: negative for headaches, seizures, paralysis, weakness, numbness  Objective:  Physical Examination:  BP (!) 148/63    Pulse 66    Temp 98.7 F (37.1 C)    Resp 20    Wt 102 lb 1.6 oz (46.3 kg)    SpO2 100%    BMI 18.67 kg/m     ECOG Performance Status: 0 Exam deferred from 12/10/2021  GENERAL: Patient is a well appearing female in no acute distress HEENT:  PERRL, neck supple with midline trachea. Thyroid without masses.  NODES:  No cervical, supraclavicular, axillary, or inguinal lymphadenopathy palpated.  LUNGS:  Clear to auscultation bilaterally.   HEART:  Regular rate and rhythm.  ABDOMEN:  Soft, nontender, nondistended. No masses/ascites/hepatomegaly  MSK:  No focal spinal tenderness to palpation. Full range of motion bilaterally in the upper extremities. EXTREMITIES:  No peripheral edema.   SKIN:  Clear with no obvious rashes or skin changes. No nail dyscrasia. NEURO:  Nonfocal. Well oriented.  Appropriate affect.  Pelvic: Exam chaperoned by Nursing EGBUS: no lesions Cervix: no lesions, nontender, mobile deviated to patient's left Vagina: no lesions, no discharge or bleeding Uterus: above limits normal size ~10 cm globular anteverted, slightly tender, mobile Adnexa: no palpable masses Rectovaginal: confirmatory  Lab Review Labs on site today: Lab Results  Component Value Date   WBC 7.4 11/28/2021   HGB 9.3 (L) 11/28/2021   HCT 28.3 (L) 11/28/2021   MCV 92.2 11/28/2021   PLT 277 11/28/2021     Chemistry      Component Value Date/Time   NA 135 05/22/2020 0804   K 4.0 05/22/2020 0804   CL 103  05/22/2020 0804   CO2 25 05/22/2020 0804   BUN 16 05/22/2020 0804   CREATININE 0.90 12/11/2021 1610   CREATININE 1.03 (H) 05/22/2020 0804      Component Value Date/Time   CALCIUM 10.0 05/22/2020 0804   ALKPHOS 57 05/22/2020 0804   AST 17 05/22/2020 0804   ALT 11 05/22/2020 0804   BILITOT 0.3 05/22/2020 0804     09/22/2021  Vitamin B12 low 238 Ferritin and iron normal  TSH 3.007 WNL   Radiologic Imaging: As per HPI  CT CHEST ABDOMEN PELVIS W CONTRAST CLINICAL DATA:  Carcinosarcoma of the uterus.  EXAM: CT CHEST, ABDOMEN, AND PELVIS WITH CONTRAST  TECHNIQUE: Multidetector CT imaging of the chest, abdomen and pelvis was performed following the standard protocol during bolus administration of intravenous contrast.  RADIATION DOSE REDUCTION: This exam was performed according to the departmental dose-optimization program which includes automated exposure control, adjustment of the mA and/or kV according to patient size and/or use of iterative reconstruction technique.  CONTRAST:  33mL OMNIPAQUE IOHEXOL 300 MG/ML  SOLN  COMPARISON:  None.  FINDINGS: CT CHEST FINDINGS  Cardiovascular: The heart size is normal. No substantial pericardial effusion. Mild atherosclerotic calcification is noted in the wall of the thoracic aorta.  Mediastinum/Nodes: No mediastinal lymphadenopathy. Calcified nodal tissue in the subcarinal station suggests prior granulomatous disease. There is no hilar lymphadenopathy. The esophagus has normal imaging features. There is no axillary lymphadenopathy.  Lungs/Pleura: Biapical pleuroparenchymal scarring evident. Mild bronchiectasis with peripheral airway impaction noted right middle lobe with small geographic area of tree-in-bud opacity noted right lower lobe (image 101/3). Calcified granuloma noted left lower lobe. There are several tiny ground-glass opacities in the left lower lobe,  nonspecific. No pleural effusion.  Musculoskeletal: No  worrisome lytic or sclerotic osseous abnormality.  CT ABDOMEN PELVIS FINDINGS  Hepatobiliary: No suspicious focal abnormality within the liver parenchyma. There is no evidence for gallstones, gallbladder wall thickening, or pericholecystic fluid. Common bile duct measures 5 mm diameter in the head of the pancreas, within normal limits.  Pancreas: No focal mass lesion. No dilatation of the main duct. No intraparenchymal cyst. No peripancreatic edema.  Spleen: No splenomegaly. No focal mass lesion. Calcified granulomata noted.  Adrenals/Urinary Tract: No adrenal nodule or mass. Kidneys unremarkable. No evidence for hydroureter. The urinary bladder appears normal for the degree of distention. Bladder is partially obscured by beam hardening artifact from what appears to be the patient's known pessary although the appearance is somewhat atypical in configuration.  Stomach/Bowel: Stomach is moderately distended with contrast material. Duodenum is normally positioned as is the ligament of Treitz. No small bowel wall thickening. No small bowel dilatation. The terminal ileum is normal. The appendix is not well visualized, but there is no edema or inflammation in the region of the cecum. No gross colonic mass. No colonic wall thickening.  Vascular/Lymphatic: There is moderate atherosclerotic calcification of the abdominal aorta without aneurysm. There is no gastrohepatic or hepatoduodenal ligament lymphadenopathy. No retroperitoneal or mesenteric lymphadenopathy. No pelvic sidewall lymphadenopathy.  Reproductive: Heterogeneously enhancing lesion identified anterior uterine fundus measuring approximately 3.4 x 2.2 x 2.7 cm. Coarse dystrophic calcification is identified between the anterior lower uterine segment in the posterior bladder base and may represent an exophytic fibroid. Uterine and parametrial anatomy is not well evaluated by CT, but beam hardening artifact from the  patient's ring-like metallic pessary essentially obscures the pelvic floor, cervix, and uterine/adnexal anatomy.  Other: No substantial intraperitoneal free fluid.  Musculoskeletal: No worrisome lytic or sclerotic osseous abnormality. Trace anterolisthesis of L4 on 5 evident.  IMPRESSION: 1. 3.4 x 2.2 x 2.7 cm heterogeneously enhancing lesion anterior uterine fundus, likely representing known neoplasm. Uterine and parametrial anatomy is essentially obscured by beam hardening artifact from the patient's ring-like metallic pessary severely limiting assessment of the uterus, cervix, and adnexal anatomy. 2. No evidence for distant metastatic disease in the chest, abdomen, or pelvis. 3. Mild bronchiectasis with peripheral airway impaction noted right middle lobe with small geographic area of tree-in-bud opacity in the right lower lobe. Imaging features are most suggestive of an infectious/inflammatory etiology. Atypical infection (including MAI) could have this appearance. 4. Tiny ground-glass opacities in the left lower lobe, nonspecific but likely infectious/inflammatory. Attention on follow-up recommended. 5. Aortic Atherosclerosis (ICD10-I70.0).  Electronically Signed   By: Misty Stanley M.D.   On: 12/11/2021 17:01      Assessment:  LUELLA GARDENHIRE is a 81 y.o. female diagnosed with carcinosarcoma of the uterus.   Abdominal pain and fatigue are concerning.   Pelvic organ prolapse, managed with pessary.   Vitamin B12 deficiency, s/p supplementation  Mild bronchiectasis, possible Atypical infection (including MAI) and tiny ground-glass opacities in the left lower lobe, nonspecific.   Abnormal EKG  Medical co-morbidities complicating care: Frailty Plan:   Problem List Items Addressed This Visit   None Visit Diagnoses     Endometrial cancer ([place])    -  Primary   Relevant Orders   ECHOCARDIOGRAM COMPLETE   Counseling and coordination of care            We discussed  recommendations. CT findings are reassuring and we reviewed surgical management. We still are awaiting consultation results from Dr. Wannetta Sender which  may alter our surgical plans. I tentatively posted her for surgery next week on 2/29/2023 with Dr. Fransisca Connors and either Dr. Marcelline Mates or Dr. Amalia Hailey. Plan for TLH, BSO, SLN injection/mapping/biopsies, EUA, washings with possible PPALND, possible laparotomy, pessary removal, possible laparotomy. I also added possible robotic approach if we need to change her surgical date to accomodate Dr. Wannetta Sender if [place] surgery is needed.   Risks were discussed in detail. These include infection, anesthesia, bleeding, transfusion, wound separation, vaginal cuff dehiscence, medical issues (blood clots, stroke, heart attack, fluid in the lungs, pneumonia, abnormal heart rhythm, death), possible exploratory surgery with larger incision, lymphedema, lymphocyst, allergic reaction, injury to adjacent organs (bowel, bladder, blood vessels, nerves, ureters, uterus).   [name], NP is to check if she is getting adequate B12 supplementation. She may need a transfusion preop or intraop given anemia.   Urogynecologic evaluation tomorrow.  Suggested return to clinic to be determined.   We will as her PCP and anesthesia to clear her for surgery. I request an ECHO given prior abnormal EKG.    Follow up pulmonary consult ordered by her PCP. I told the patient and her daughter that we typically repeat images in this situation for follow up.    The patient's diagnosis, an outline of the further diagnostic and laboratory studies which will be required, the recommendation for surgery, and alternatives were discussed with her and her accompanying family members.  All questions were answered to their satisfaction.   Briley Sulton Gaetana Michaelis, MD      CC:  Dr. Rubie Maid

## 2021-12-18 ENCOUNTER — Other Ambulatory Visit: Payer: Self-pay | Admitting: Nurse Practitioner

## 2021-12-18 ENCOUNTER — Ambulatory Visit: Payer: PPO | Admitting: Obstetrics and Gynecology

## 2021-12-18 ENCOUNTER — Encounter: Payer: Self-pay | Admitting: Obstetrics and Gynecology

## 2021-12-18 VITALS — BP 166/89 | HR 91 | Ht 61.0 in | Wt 102.0 lb

## 2021-12-18 DIAGNOSIS — C541 Malignant neoplasm of endometrium: Secondary | ICD-10-CM | POA: Diagnosis not present

## 2021-12-18 DIAGNOSIS — N8110 Cystocele, unspecified: Secondary | ICD-10-CM

## 2021-12-18 DIAGNOSIS — R350 Frequency of micturition: Secondary | ICD-10-CM

## 2021-12-18 DIAGNOSIS — R82998 Other abnormal findings in urine: Secondary | ICD-10-CM | POA: Diagnosis not present

## 2021-12-18 DIAGNOSIS — N816 Rectocele: Secondary | ICD-10-CM

## 2021-12-18 DIAGNOSIS — N812 Incomplete uterovaginal prolapse: Secondary | ICD-10-CM | POA: Diagnosis not present

## 2021-12-18 DIAGNOSIS — D519 Vitamin B12 deficiency anemia, unspecified: Secondary | ICD-10-CM

## 2021-12-18 NOTE — Progress Notes (Signed)
Spoke to patient. Recommended b12 injection prior to planned surgery. B12 level was < 500 and she has anemia which may be associated. Patient agreeable to receive injection. Will defer additional management of b12 deficiency to patient's pcp.

## 2021-12-18 NOTE — Patient Instructions (Signed)
Plan for surgery: laparoscopic uterosacral ligament suspension, possible anterior and posterior repair  POST OPERATIVE INSTRUCTIONS  General Instructions Recovery (not bed rest) will last approximately 6 weeks Walking is encouraged, but refrain from strenuous exercise/ housework/ heavy lifting. No lifting >10lbs  Nothing in the vagina- NO intercourse, tampons or douching Bathing:  Do not submerge in water (NO swimming, bath, hot tub, etc) until after your postop visit. You can shower starting the day after surgery.  No driving until you are not taking narcotic pain medicine and until your pain is well enough controlled that you can slam on the breaks or make sudden movements if needed.    Reasons to Call the Nurse (see last page for phone numbers) Heavy Bleeding (changing your pad every 1-2 hours) Persistent nausea/vomiting Fever (100.4 degrees or more) Incision problems (pus or other fluid coming out, redness, warmth, increased pain)  Things to Expect After Surgery Mild to Moderate pain is normal during the first day or two after surgery. If prescribed, take Ibuprofen or Tylenol first and use the stronger medicine for break-through pain. You can overlap these medicines because they work differently.   Constipation   To Prevent Constipation:  Eat a well-balanced diet including protein, grains, fresh fruit and vegetables.  Drink plenty of fluids. Walk regularly.  Depending on specific instructions from your physician: take Miralax daily and additionally you can add a stool softener (colace/ docusate) and fiber supplement. Continue as long as you're on pain medications.   To Treat Constipation:  If you do not have a bowel movement in 2 days after surgery, you can take 2 Tbs of Milk of Magnesia 1-2 times a day until you have a bowel movement. If diarrhea occurs, decrease the amount or stop the laxative. If no results with Milk of Magnesia, you can drink a bottle of magnesium citrate which  you can purchase over the counter.  Fatigue:  This is a normal response to surgery and will improve with time.  Plan frequent rest periods throughout the day.  Gas Pain:  This is very common but can also be very painful! Drink warm liquids such as herbal teas, bouillon or soup. Walking will help you pass more gas.  Mylicon or Gas-X can be taken over the counter.  Leaking Urine:  Varying amounts of leakage may occur after surgery.  This should improve with time. Your bladder needs at least 3 months to recover from surgery. If you leak after surgery, be sure to mention this to your doctor at your post-op visit. If you were taking medications for overactive bladder prior to surgery, be sure to restart the medications immediately after surgery.  Incisions: If you have incisions on your abdomen, the skin glue will dissolve on its own over time. It is ok to gently rinse with soap and water over these incisions but do not scrub.  Catheter Approximately 50% of patients are unable to urinate after surgery and need to go home with a catheter. This allows your bladder to rest so it can return to full function. If you go home with a catheter, the office will call to set up a voiding trial a few days after surgery. For most patients, by this visit, they are able to urinate on their own. Long term catheter use is rare.   Post op concerns  For non-emergent issues, please call the Urogynecology Nurse. Please leave a message and someone will contact you within one business day.  You can also send a message  through [facility].   AFTER HOURS (After 5:00 PM and on weekends):  For urgent matters that cannot wait until the next business day. Call our office [phone] and connect to the doctor on call.  Please reserve this for important issues.   **FOR ANY TRUE EMERGENCY ISSUES CALL 911 OR GO TO THE NEAREST EMERGENCY ROOM.** Please inform our office or the doctor on call of any emergency.     APPOINTMENTS: Call  [phone]

## 2021-12-18 NOTE — Progress Notes (Signed)
[facility] New Patient Evaluation and Consultation  Referring Provider: Gillis Ends* PCP: Adin Hector, MD Date of Service: 12/18/2021  SUBJECTIVE Chief Complaint: New Patient (Initial Visit) Elizabeth Friedman is a 81 y.o. female here for a consult on prolapse./)  History of Present Illness: Elizabeth Friedman is a 81 y.o. White or Caucasian female seen in consultation at the request of Dr. Theora Gianotti for evaluation of prolapse.    Review of records significant for: PMH of breast cancer (right sided ductal carcinoma, s/p lumpectomy followed by radiation) diagnosed in 2021. S/p  hysteroscopy with removal of endometrial mass and dilation and curettage. Surgery performed for persistent  endometrial mass and new onset postmenopausal bleeding . Pathology showed uterine carcinosarcoma. She is scheduled for TLH, BSO, SLN injection/mapping/biopsies, EUA, washings with possible PPALND, possible laparotomy on 12/24/21 with Dr Fransisca Connors.  Currently has gehrung pessary in place.   Urinary Symptoms: Leaks occasionally within the last several months. Unsure when it happens.  Has to wear a thin pad.   Day time voids- every few hours, normal.  Nocturia: 1 times per night to void. Voiding dysfunction: she empties her bladder well.  does not use a catheter to empty bladder.  When urinating, she feels the need to urinate multiple times in a row  UTIs:  0  UTI's in the last year.   Denies history of blood in urine and kidney or bladder stones  Pelvic Organ Prolapse Symptoms:                  Not currently feeling a bulge.  Has a pessary in place- permanent pessary with metal and rubber. Removed a few years ago.   Bowel Symptom: Bowel movements: 1-2 time(s) per day Stool consistency: hard or soft  Straining: yes.  Splinting: yes.  Incomplete evacuation: yes.  She Denies accidental bowel leakage / fecal incontinence Bowel regimen: none   Sexual Function Sexually active: no.    Pelvic Pain Denies chronic pelvic pain   Past Medical History:  Past Medical History:  Diagnosis Date   Allergic rhinitis    Anemia    Anginal pain (HCC)    Arthritis    Atypical chest pain    Breast cancer (HCC)    Cancer (HCC)    skin   Fibrocystic breast disease    GERD (gastroesophageal reflux disease)    Hearing aid worn    bilateral   History of hiatal hernia    Hyperlipidemia    Lung nodule    Menopause    Migraine    Migraine    optical migraines   Motion sickness    Plantar fasciitis    Prolapse of female pelvic organs    hodge pessary   Raynaud's disease    Sciatic leg pain    Sciatica    Urinary retention with incomplete bladder emptying    Vitreous detachment of both eyes      Past Surgical History:   Past Surgical History:  Procedure Laterality Date   APPENDECTOMY     BREAST LUMPECTOMY WITH RADIOACTIVE SEED LOCALIZATION Right 05/29/2020   Procedure: RIGHT BREAST LUMPECTOMY WITH RADIOACTIVE SEED LOCALIZATION;  Surgeon: Rolm Bookbinder, MD;  Location: [facility];  Service: General;  Laterality: Right;   CARDIAC CATHETERIZATION     CATARACT EXTRACTION W/PHACO Right 02/26/2021   Procedure: CATARACT EXTRACTION PHACO AND INTRAOCULAR LENS PLACEMENT ([facility]) RIGHT 5.86 01:19.6 7.4%;  Surgeon: Leandrew Koyanagi, MD;  Location: [facility];  Service: Ophthalmology;  Laterality: Right;   CATARACT EXTRACTION W/PHACO Left 09/03/2021   Procedure: CATARACT EXTRACTION PHACO AND INTRAOCULAR LENS PLACEMENT ([place]) LEFT;  Surgeon: Leandrew Koyanagi, MD;  Location: [facility];  Service: Ophthalmology;  Laterality: Left;  9.61 01:15.9   COLONOSCOPY WITH PROPOFOL N/A 03/27/2016   Procedure: COLONOSCOPY WITH PROPOFOL;  Surgeon: Manya Silvas, MD;  Location: [facility]COPY;  Service: Endoscopy;  Laterality: N/A;   COLONOSCOPY WITH PROPOFOL N/A 08/14/2020   Procedure: COLONOSCOPY WITH PROPOFOL;  Surgeon: Toledo, Benay Pike, MD;  Location:  [facility] ENDOSCOPY;  Service: Gastroenterology;  Laterality: N/A;   DILATATION & CURETTAGE/HYSTEROSCOPY WITH MYOSURE N/A 12/01/2021   Procedure: DILATATION & CURETTAGE/HYSTEROSCOPY WITH MYOSURE;  Surgeon: Rubie Maid, MD;  Location: [facility];  Service: Gynecology;  Laterality: N/A;   ESOPHAGOGASTRODUODENOSCOPY (EGD) WITH PROPOFOL N/A 08/14/2020   Procedure: ESOPHAGOGASTRODUODENOSCOPY (EGD) WITH PROPOFOL;  Surgeon: Toledo, Benay Pike, MD;  Location: [facility] ENDOSCOPY;  Service: Gastroenterology;  Laterality: N/A;   ESOPHAGOGASTRODUODENOSCOPY (EGD) WITH PROPOFOL N/A 03/21/2021   Procedure: ESOPHAGOGASTRODUODENOSCOPY (EGD) WITH PROPOFOL;  Surgeon: Toledo, Benay Pike, MD;  Location: [facility] ENDOSCOPY;  Service: Gastroenterology;  Laterality: N/A;   SKIN CANCER EXCISION     face and neck     Past OB/GYN History: OB History  Gravida Para Term Preterm AB Living  2 2 2     2   SAB IAB Ectopic Multiple Live Births          2    # Outcome Date GA Lbr Len/2nd Weight Sex Delivery Anes PTL Lv  2 Term 1964    F Vag-Spont   LIV  1 Term 1962    F Vag-Spont   LIV   See details above regarding recent carcinosarcoma diagnosis.    Medications: She has a current medication list which includes the following prescription(s): acetaminophen, alum & mag hydroxide-simeth, calcium-vitamin d, celecoxib, vitamin d, coenzyme q10, cyanocobalamin, gabapentin, ilevro, vitron-c, omega-3 fatty acids, pantoprazole, restasis, simvastatin, topiramate, tramadol, triamcinolone acetonide, vigamox, and vitamin e.   Allergies: Patient has No Known Allergies.   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 Regular exercise: Yes: [employer] History of abuse: No  Family History:   Family History  Problem Relation Age of Onset    Breast cancer Mother    Heart disease Father    Colon cancer Brother    Diabetes Neg Hx    Ovarian cancer Neg Hx      Review of Systems: Review of Systems  Constitutional:  Positive for weight loss. Negative for fever and malaise/fatigue.  Respiratory:  Negative for cough, shortness of breath and wheezing.   Cardiovascular:  Negative for chest pain, palpitations and leg swelling.  Gastrointestinal:  Negative for abdominal pain and blood in stool.  Genitourinary:  Negative for dysuria.  Musculoskeletal:  Negative for myalgias.  Skin:  Negative for rash.  Neurological:  Negative for dizziness and headaches.  Endo/Heme/Allergies:  Does not bruise/bleed easily.  Psychiatric/Behavioral:  Negative for depression. The patient is not nervous/anxious.     OBJECTIVE Physical Exam: Vitals:   12/18/21 1325  BP: (!) 166/89  Pulse: 91  Weight: 102 lb (46.3 kg)  Height: 5\' 1"  (1.549 m)    Physical Exam Constitutional:  General: She is not in acute distress. Pulmonary:     Effort: Pulmonary effort is normal.  Abdominal:     General: There is no distension.     Palpations: Abdomen is soft.     Tenderness: There is no abdominal tenderness. There is no rebound.  Musculoskeletal:        General: No swelling. Normal range of motion.  Skin:    General: Skin is warm and dry.     Findings: No rash.  Neurological:     Mental Status: She is alert and oriented to person, place, and time.  Psychiatric:        Mood and Affect: Mood normal.        Behavior: Behavior normal.     GU / Detailed Urogynecologic Evaluation:  Pelvic Exam: Normal external female genitalia; Bartholin's and Skene's glands normal in appearance; urethral meatus normal in appearance, no urethral masses or discharge.   CST: catheter inserted and bladder backfilled until patient felt full (468ml). Cough stress test was negative. Bladder was drained with the catheter.   Speculum exam reveals metal pessary in place.  Unable to fully appreciate condition of vaginal mucosa but no obvious erosions present.  Cervix normal appearance. Uterus normal single, nontender. Adnexa no mass, fullness, tenderness.     Pelvic floor strength IV/V  Pelvic floor musculature: Right levator non-tender, Right obturator non-tender, Left levator non-tender, Left obturator non-tender  POP-Q:  Unable to see full extent of prolapse due to pessary in place.  POP-Q  -1                                            Aa   -1                                           Ba  -5                                              C   2                                            Gh  4                                            Pb  8                                            tvl   -2                                            Ap  -2  Bp  -7.5                                              D     Rectal Exam:  Normal external rectum  Post-Void Residual (PVR) by Bladder Scan: In order to evaluate bladder emptying, we discussed obtaining a postvoid residual and she agreed to this procedure.  Procedure: The ultrasound unit was placed on the patient's abdomen in the suprapubic region after the patient had voided. A PVR of 10 ml was obtained by bladder scan.  Laboratory Results: POC urine: moderate leukocytes, negative nitrites   ASSESSMENT AND PLAN Ms. Crotwell is a 81 y.o. with:  1. Prolapse of anterior vaginal wall   2. Prolapse of posterior vaginal wall   3. Uterovaginal prolapse, incomplete   4. Leukocytes in urine   5. Urinary frequency    Stage II anterior, Stage I posterior, Stage I apical prolapse - unable to fully appreciate prolapse due to "permanent" metal pessary in place which patient declined removal of today - Since she is already having a laparoscopic hysterectomy with Dr Fransisca Connors of Oncology, will also plan a concurrent prolapse repair. We reviewed that she will need  something for apical support but it is unclear what other procedures she will need until the pessary is removed. Plan for surgery is: Laparoscopic uterosacral ligament suspension, possible anterior and posterior repair, cystoscopy - urine sent today for culture due to presence of leukocytes  - she was fully counseled on risks of surgery today as well as post-operative expectations and possible need for a catheter.   Jaquita Folds, MD

## 2021-12-19 ENCOUNTER — Telehealth: Payer: Self-pay

## 2021-12-19 LAB — POCT URINALYSIS DIPSTICK
Appearance: NORMAL
Bilirubin, UA: NEGATIVE
Glucose, UA: NEGATIVE
Ketones, UA: NEGATIVE
Nitrite, UA: NEGATIVE
Protein, UA: NEGATIVE
Spec Grav, UA: 1.015 (ref 1.010–1.025)
Urobilinogen, UA: 0.2 E.U./dL
pH, UA: 6.5 (ref 5.0–8.0)

## 2021-12-19 NOTE — Telephone Encounter (Signed)
Per Harrell Gave [employer]/[phone] No auth required.

## 2021-12-20 LAB — URINE CULTURE: Culture: <10000 — AB

## 2021-12-22 ENCOUNTER — Inpatient Hospital Stay: Payer: PPO

## 2021-12-22 ENCOUNTER — Encounter
Admission: RE | Admit: 2021-12-22 | Discharge: 2021-12-22 | Disposition: A | Discharge status: Home / Self Care | Payer: PPO | Source: Ambulatory Visit | Attending: Obstetrics and Gynecology | Admitting: Obstetrics and Gynecology

## 2021-12-22 ENCOUNTER — Ambulatory Visit
Admission: RE | Admit: 2021-12-22 | Discharge: 2021-12-22 | Disposition: A | Discharge status: Home / Self Care | Payer: PPO | Source: Ambulatory Visit | Attending: Obstetrics and Gynecology | Admitting: Obstetrics and Gynecology

## 2021-12-22 ENCOUNTER — Ambulatory Visit: Payer: PPO

## 2021-12-22 ENCOUNTER — Encounter: Payer: Self-pay | Admitting: Urgent Care

## 2021-12-22 ENCOUNTER — Other Ambulatory Visit: Payer: Self-pay

## 2021-12-22 VITALS — Ht 61.0 in | Wt 102.0 lb

## 2021-12-22 DIAGNOSIS — E785 Hyperlipidemia, unspecified: Secondary | ICD-10-CM

## 2021-12-22 DIAGNOSIS — R0789 Other chest pain: Secondary | ICD-10-CM | POA: Insufficient documentation

## 2021-12-22 DIAGNOSIS — D508 Other iron deficiency anemias: Secondary | ICD-10-CM

## 2021-12-22 DIAGNOSIS — Z01818 Encounter for other preprocedural examination: Secondary | ICD-10-CM

## 2021-12-22 DIAGNOSIS — Z01810 Encounter for preprocedural cardiovascular examination: Secondary | ICD-10-CM | POA: Diagnosis not present

## 2021-12-22 DIAGNOSIS — C541 Malignant neoplasm of endometrium: Secondary | ICD-10-CM | POA: Insufficient documentation

## 2021-12-22 DIAGNOSIS — E538 Deficiency of other specified B group vitamins: Secondary | ICD-10-CM

## 2021-12-22 DIAGNOSIS — Z01812 Encounter for preprocedural laboratory examination: Secondary | ICD-10-CM

## 2021-12-22 DIAGNOSIS — I340 Nonrheumatic mitral (valve) insufficiency: Secondary | ICD-10-CM | POA: Insufficient documentation

## 2021-12-22 DIAGNOSIS — I7300 Raynaud's syndrome without gangrene: Secondary | ICD-10-CM | POA: Insufficient documentation

## 2021-12-22 DIAGNOSIS — I517 Cardiomegaly: Secondary | ICD-10-CM | POA: Insufficient documentation

## 2021-12-22 DIAGNOSIS — Z20822 Contact with and (suspected) exposure to covid-19: Secondary | ICD-10-CM

## 2021-12-22 HISTORY — DX: Intraductal carcinoma in situ of right breast: D05.11

## 2021-12-22 HISTORY — DX: Unspecified malignant neoplasm of skin of unspecified part of face: C44.300

## 2021-12-22 LAB — BASIC METABOLIC PANEL
Anion gap: 8 (ref 5–15)
BUN: 15 mg/dL (ref 8–23)
CO2: 24 mmol/L (ref 22–32)
Calcium: 9 mg/dL (ref 8.9–10.3)
Chloride: 95 mmol/L — ABNORMAL LOW (ref 98–111)
Creatinine, Ser: 0.97 mg/dL (ref 0.44–1.00)
GFR, Estimated: 59 mL/min — ABNORMAL LOW (ref >60)
Glucose, Bld: 143 mg/dL — ABNORMAL HIGH (ref 70–99)
Potassium: 4 mmol/L (ref 3.5–5.1)
Sodium: 127 mmol/L — ABNORMAL LOW (ref 135–145)

## 2021-12-22 LAB — CBC
HCT: 29.4 % — ABNORMAL LOW (ref 36.0–46.0)
Hemoglobin: 9.6 g/dL — ABNORMAL LOW (ref 12.0–15.0)
MCH: 30.1 pg (ref 26.0–34.0)
MCHC: 32.7 g/dL (ref 30.0–36.0)
MCV: 92.2 fL (ref 80.0–100.0)
Platelets: 260 10*3/uL (ref 150–400)
RBC: 3.19 MIL/uL — ABNORMAL LOW (ref 3.87–5.11)
RDW: 13.3 % (ref 11.5–15.5)
WBC: 7.1 10*3/uL (ref 4.0–10.5)
nRBC: 0 % (ref 0.0–0.2)

## 2021-12-22 LAB — TYPE AND SCREEN
ABO/RH(D): O POS
Antibody Screen: NEGATIVE

## 2021-12-22 LAB — ECHOCARDIOGRAM COMPLETE
AR max vel: 2.31 cm2
AV Area VTI: 2.49 cm2
AV Area mean vel: 2.12 cm2
AV Mean grad: 2.5 mmHg
AV Peak grad: 4 mmHg
Ao pk vel: 1 m/s
Area-P 1/2: 2.91 cm2
MV VTI: 2.31 cm2
S' Lateral: 2.4 cm

## 2021-12-22 MED ORDER — CYANOCOBALAMIN 1000 MCG/ML IJ SOLN
1000.0000 ug | Freq: Once | INTRAMUSCULAR | Status: AC
  Administered 2021-12-22: 1000 ug via INTRAMUSCULAR
  Filled 2021-12-22: qty 1

## 2021-12-22 NOTE — Patient Instructions (Addendum)
Your procedure is scheduled on: Wednesday, March 1 Report to the Registration Desk on the 1st floor of the [facility]. To find out your arrival time, please call [phone] between 1PM - 3PM on: Tuesday, February 28  REMEMBER: Instructions that are not followed completely may result in serious medical risk, up to and including death; or upon the discretion of your surgeon and anesthesiologist your surgery may need to be rescheduled.  Do not eat food after midnight the night before surgery.  No gum chewing, lozengers or hard candies.  You may however, drink CLEAR liquids up to 2 hours before you are scheduled to arrive for your surgery. Do not drink anything within 2 hours of your scheduled arrival time.  Clear liquids include: - water  - apple juice without pulp - gatorade (not RED colors) - black coffee or tea (Do NOT add milk or creamers to the coffee or tea) Do NOT drink anything that is not on this list.  In addition, your doctor has ordered for you to drink the provided  Ensure Pre-Surgery Clear Carbohydrate Drink  Drinking this carbohydrate drink up to two hours before surgery helps to reduce insulin resistance and improve patient outcomes. Please complete drinking 2 hours prior to scheduled arrival time.  TAKE THESE MEDICATIONS THE MORNING OF SURGERY WITH A SIP OF WATER:  Gabapentin Pantoprazole - (take one the night before and one on the morning of surgery - helps to prevent nausea after surgery.) Tylenol or tramadol if needed for pain  One week prior to surgery: Stop Anti-inflammatories (NSAIDS) such as Advil, Aleve, Ibuprofen, Motrin, Naproxen, Naprosyn and Aspirin based products such as Excedrin, Goodys Powder, BC Powder. Stop ANY OVER THE COUNTER supplements until after surgery. Stop calcium, vitamin D, coenzyme, vitamin C, fish oil, vitamin E You may however, continue to take Tylenol if needed for pain up until the day of surgery.  No Alcohol for 24 hours before  or after surgery.  No Smoking including e-cigarettes for 24 hours prior to surgery.  No chewable tobacco products for at least 6 hours prior to surgery.  No nicotine patches on the day of surgery.  Do not use any "recreational" drugs for at least a week prior to your surgery.  Please be advised that the combination of cocaine and anesthesia may have negative outcomes, up to and including death. If you test positive for cocaine, your surgery will be cancelled.  On the morning of surgery brush your teeth with toothpaste and water, you may rinse your mouth with mouthwash if you wish. Do not swallow any toothpaste or mouthwash.  Use CHG Soap as directed on instruction sheet.  Do not wear jewelry, make-up, hairpins, clips or nail polish.  Do not wear lotions, powders, or perfumes.   Do not shave body from the neck down 48 hours prior to surgery just in case you cut yourself which could leave a site for infection.  Also, freshly shaved skin may become irritated if using the CHG soap.  Contact lenses, hearing aids and dentures may not be worn into surgery.  Do not bring valuables to the hospital. [facility] is not responsible for any missing/lost belongings or valuables.   Notify your doctor if there is any change in your medical condition (cold, fever, infection).  Wear comfortable clothing (specific to your surgery type) to the hospital.  After surgery, you can help prevent lung complications by doing breathing exercises.  Take deep breaths and cough every 1-2 hours. Your doctor  may order a device called an Incentive Spirometer to help you take deep breaths. When coughing or sneezing, hold a pillow firmly against your incision with both hands. This is called splinting. Doing this helps protect your incision. It also decreases belly discomfort.  If you are being admitted to the hospital overnight, leave your suitcase in the car. After surgery it may be brought to your room.  If you  are being discharged the day of surgery, you will not be allowed to drive home. You will need a responsible adult (18 years or older) to drive you home and stay with you that night.   If you are taking public transportation, you will need to have a responsible adult (18 years or older) with you. Please confirm with your physician that it is acceptable to use public transportation.   Please call the [facility] at [phone] if you have any questions about these instructions.  Surgery Visitation Policy:  Patients undergoing a surgery or procedure may have one family member or support person with them as long as that person is not COVID-19 positive or experiencing its symptoms.  That person may remain in the waiting area during the procedure and may rotate out with other people.  Inpatient Visitation:    Visiting hours are 7 a.m. to 8 p.m. Up to two visitors ages 16+ are allowed at one time in a patient room. The visitors may rotate out with other people during the day. Visitors must check out when they leave, or other visitors will not be allowed. One designated support person may remain overnight. The visitor must pass COVID-19 screenings, use hand sanitizer when entering and exiting the patients room and wear a mask at all times, including in the patients room. Patients must also wear a mask when staff or their visitor are in the room. Masking is required regardless of vaccination status.

## 2021-12-22 NOTE — H&P (Signed)
Gynecologic Oncology Interval Visit    Referring Provider: Dr. Marcelline Mates   Chief Complaint: Carcinosarcoma   Subjective:  Elizabeth Friedman is a 81 y.o. female who is seen in consultation from Dr. Marcelline Mates for lower pelvic pain x 1 month and uterine carcinosarcoma. She was initially seen by GI but pain persisted and she was referred to gynecology. She has hx of Gehrung pessary/permanent pessary (20 years).    She presents to discuss her CT scan which is reassuring. She has an UroGyn appt tomorrow with Dr. Wannetta Sender regarding her pessary.    Her PCP recommended a pulmonary consultation given the CT pulmonary findings.    She had an abnormal EKG on 11/28/2021 that showed PVS, nonspecific ST and T wave abnormalities.    Gynecologic Oncology History  Elizabeth Friedman is a pleasant female who is seen in consultation from Dr. Marcelline Mates for lower pelvic pain x 1 month and uterine carcinosarcoma.    US Pelvis  The uterus is anteverted and measures 9.6 X 7.1 X 5.5 cm. Echo texture is heterogenous with evidence of focal mass in the Endometrium.   The Endometrium measures 35 mm.  There is an echogenic,  mixed echogenicity mass in the endometrial canal ,measuring: 3.7 x 4.1 x 3.3 cm; This is hyperemic and has a small amount of fluid surrounding it within the endometrial cavity.   Right Ovary is not visualized. Left Ovary measures 2.8 x  2.7 x 1.5 cm. It is normal in appearance. Survey of the adnexae demonstrates no adnexal masses. There is no free fluid in the cul de sac.   She began having vaginal spotting and brown discharge. Abdominal pain increased.    Underwent hysteroscopy with removal of endometrial mass and D&C.    DIAGNOSIS: A. ENDOMETRIUM; CURETTAGE: - CARCINOSARCOMA / MALIGNANT MIXED MULLERIAN TUMOR.   B. ENDOMETRIAL MASS; CURETTAGE: - CARCINOSARCOMA / MALIGNANT MIXED MULLERIAN TUMOR.   PMH significant for DCIS of right breast s/p lumpectomy and radiation in 2021. Patient previously followed by  Dr. Jana Hakim at [facility] at [facility] but has transferred care to Dr. Rogue Bussing at [facility] at [facility] and next appointment in May 2023.    She complains of persistent of GERD-like symptoms and epigastric discomfort since her hysteroscopy. Incomplete emptying of her bladder. She wears a pessary that has not been removed in years.    Problem List:     Patient Active Problem List    Diagnosis Date Noted   Lung granuloma ([place]) 12/10/2021   B12 deficiency 09/25/2021   History of ductal carcinoma in situ (DCIS) of breast 09/10/2020   Iron deficiency anemia 09/10/2020   Ductal carcinoma in situ (DCIS) of right breast 05/17/2020   Bilateral carotid artery stenosis 02/06/2020   Visual changes 01/12/2020   Carotid stenosis 01/11/2020   Migraine aura without headache 03/07/2019   Complete tear of left rotator cuff 03/10/2017   Pessary maintenance 12/18/2016   Rectocele 12/18/2016   Midline cystocele 12/18/2016   Menopausal syndrome on hormone replacement therapy 12/18/2016   Allergic rhinitis 12/17/2015   Atypical chest pain 12/17/2015   Bloodgood disease 12/17/2015   Acid reflux 12/17/2015   HLD (hyperlipidemia) 12/17/2015   Lung mass 12/17/2015   Uterine prolapse 12/17/2015   CA of skin 12/17/2015   Post menopausal syndrome 01/14/2015   Incomplete bladder emptying 01/14/2015   Neuralgia neuritis, sciatic nerve 01/14/2015   H/O malignant neoplasm of skin 09/17/2014      Past Medical History:     Past Medical History:  Diagnosis Date   Allergic rhinitis     Anemia     Anginal pain (HCC)     Arthritis     Atypical chest pain     Breast cancer (HCC)     Cancer (HCC)      skin   Fibrocystic breast disease     GERD (gastroesophageal reflux disease)     Hearing aid worn      bilateral   History of hiatal hernia     Hyperlipidemia     Lung nodule     Menopause     Migraine     Migraine      optical migraines   Motion sickness     Plantar fasciitis     Prolapse of female pelvic organs       hodge pessary   Raynaud's disease     Sciatic leg pain     Sciatica     Urinary retention with incomplete bladder emptying     Vitreous detachment of both eyes        Past Surgical History:      Past Surgical History:  Procedure Laterality Date   APPENDECTOMY       BREAST LUMPECTOMY WITH RADIOACTIVE SEED LOCALIZATION Right 05/29/2020    Procedure: RIGHT BREAST LUMPECTOMY WITH RADIOACTIVE SEED LOCALIZATION;  Surgeon: Rolm Bookbinder, MD;  Location: [place];  Service: General;  Laterality: Right;   CARDIAC CATHETERIZATION       CATARACT EXTRACTION W/PHACO Right 02/26/2021    Procedure: CATARACT EXTRACTION PHACO AND INTRAOCULAR LENS PLACEMENT (I[facility]) RIGHT 5.86 01:19.6 7.4%;  Surgeon: Leandrew Koyanagi, MD;  Location: [place];  Service: Ophthalmology;  Laterality: Right;   CATARACT EXTRACTION W/PHACO Left 09/03/2021    Procedure: CATARACT EXTRACTION PHACO AND INTRAOCULAR LENS PLACEMENT ([place]) LEFT;  Surgeon: Leandrew Koyanagi, MD;  Location: [place];  Service: Ophthalmology;  Laterality: Left;  9.61 01:15.9   COLONOSCOPY WITH PROPOFOL N/A 03/27/2016    Procedure: COLONOSCOPY WITH PROPOFOL;  Surgeon: Manya Silvas, MD;  Location: [facility] ENDOSCOPY;  Service: Endoscopy;  Laterality: N/A;   COLONOSCOPY WITH PROPOFOL N/A 08/14/2020    Procedure: COLONOSCOPY WITH PROPOFOL;  Surgeon: Toledo, Benay Pike, MD;  Location: [facility] ENDOSCOPY;  Service: Gastroenterology;  Laterality: N/A;   DILATATION & CURETTAGE/HYSTEROSCOPY WITH MYOSURE N/A 12/01/2021    Procedure: DILATATION & CURETTAGE/HYSTEROSCOPY WITH MYOSURE;  Surgeon: Rubie Maid, MD;  Location: [facility] ORS;  Service: Gynecology;  Laterality: N/A;   ESOPHAGOGASTRODUODENOSCOPY (EGD) WITH PROPOFOL N/A 08/14/2020    Procedure: ESOPHAGOGASTRODUODENOSCOPY (EGD) WITH PROPOFOL;  Surgeon: Toledo, Benay Pike, MD;  Location: [facility] ENDOSCOPY;  Service: Gastroenterology;  Laterality: N/A;   ESOPHAGOGASTRODUODENOSCOPY (EGD)  WITH PROPOFOL N/A 03/21/2021    Procedure: ESOPHAGOGASTRODUODENOSCOPY (EGD) WITH PROPOFOL;  Surgeon: Toledo, Benay Pike, MD;  Location: [facility] ENDOSCOPY;  Service: Gastroenterology;  Laterality: N/A;   SKIN CANCER EXCISION        face and neck      Past Gynecologic History:  As per HPI   OB History:                  OB History  Gravida Para Term Preterm AB Living  2 2 2     2   SAB IAB Ectopic Multiple Live Births             2        # Outcome Date GA Lbr Len/2nd Weight Sex Delivery Anes PTL Lv  2 Term 1964  F Vag-Spont     LIV  1 Term 75       F Vag-Spont     LIV      Family History:      Family History  Problem Relation Age of Onset   Breast cancer Mother     Heart disease Father     Colon cancer Brother     Diabetes Neg Hx     Ovarian cancer Neg Hx        Social History: Social History         Socioeconomic History   Marital status: Widowed      Spouse name: Not on file   Number of children: Not on file   Years of education: Not on file   Highest education level: Not on file  Occupational History   Not on file  Tobacco Use   Smoking status: Never   Smokeless tobacco: Never  Vaping Use   Vaping Use: Never used  Substance and Sexual Activity   Alcohol use: No   Drug use: No   Sexual activity: Not Currently      Birth control/protection: Post-menopausal  Other Topics Concern   Not on file  Social History Narrative   Not on file    Social Determinants of Health    Financial Resource Strain: Not on file  Food Insecurity: Not on file  Transportation Needs: Not on file  Physical Activity: Not on file  Stress: Not on file  Social Connections: Not on file  Intimate Partner Violence: Not on file      Allergies: No Known Allergies   Current Medications:       Current Outpatient Medications  Medication Sig Dispense Refill   acetaminophen (TYLENOL) 500 MG tablet Take 1,000 mg by mouth every 6 (six) hours as needed for moderate pain or mild pain.        alum & mag hydroxide-simeth (MAALOX/MYLANTA) 200-200-20 MG/5ML suspension Take 30 mLs by mouth every 6 (six) hours as needed for indigestion or heartburn.       Calcium-Vitamin D (CALTRATE 600 PLUS-VIT D PO) Take 1 tablet by mouth daily.       celecoxib (CELEBREX) 200 MG capsule Take 200 mg by mouth 2 (two) times daily.       Cholecalciferol (VITAMIN D) 125 MCG (5000 UT) CAPS Take 5,000 Units by mouth daily.       Coenzyme Q10 200 MG TABS Take 200 mg by mouth daily.       Cyanocobalamin (VITAMIN B-12 PO) Take 1 tablet by mouth daily.       gabapentin (NEURONTIN) 100 MG capsule Take 100 mg by mouth 4 (four) times daily.       ibuprofen (ADVIL) 600 MG tablet Take 1 tablet (600 mg total) by mouth every 6 (six) hours as needed. 30 tablet 0   ILEVRO 0.3 % ophthalmic suspension         Iron-Vitamin C (VITRON-C) 65-125 MG TABS Take 1 tablet by mouth daily.       Omega-3 Fatty Acids (FISH OIL PO) Take 1,400 mg by mouth daily.       pantoprazole (PROTONIX) 40 MG tablet Take 40 mg by mouth every morning.       RESTASIS 0.05 % ophthalmic emulsion Place 1 drop into both eyes 2 (two) times daily.       simvastatin (ZOCOR) 10 MG tablet Take 10 mg by mouth at bedtime.       topiramate (TOPAMAX) 50 MG  tablet Take 50 mg by mouth at bedtime.       traMADol (ULTRAM) 50 MG tablet Take 1-2 tablets (50-100 mg total) by mouth every 6 (six) hours as needed. 20 tablet 0   Triamcinolone Acetonide (NASACORT ALLERGY 24HR NA) Place 1 spray into the nose daily as needed (sinus).       VIGAMOX 0.5 % ophthalmic solution         vitamin E 180 MG (400 UNITS) capsule Take 400 Units by mouth daily.        No current facility-administered medications for this visit.    Review of Systems General: negative for fevers, changes in weight or night sweats Skin: negative for changes in moles or sores or rash Eyes: negative for changes in vision HEENT: hearing aids; negative for change in hearing, tinnitus, voice  changes Pulmonary: negative for dyspnea, orthopnea, productive cough, wheezing Cardiac: negative for palpitations, pain Gastrointestinal: abdominal pain and poor appetite, she attributes to a medication she is taking; negative for nausea, vomiting, constipation, diarrhea, hematemesis, hematochezia Genitourinary/Sexual:  urinary incontinence o/w negative for dysuria, retention, hematuria, Ob/Gyn:  negative for abnormal bleeding, or pain Musculoskeletal: joint pain,negative for back pain Hematology: negative for easy bruising, abnormal bleeding Neurologic/Psych: negative for headaches, seizures, paralysis, weakness, numbness   Objective:  Physical Examination:  BP (!) 148/63    Pulse 66    Temp 98.7 F (37.1 C)    Resp 20    Wt 102 lb 1.6 oz (46.3 kg)    SpO2 100%    BMI 18.67 kg/m     ECOG Performance Status: 0 Exam deferred from 12/10/2021   GENERAL: Patient is a well appearing female in no acute distress HEENT:  PERRL, neck supple with midline trachea. Thyroid without masses.  NODES:  No cervical, supraclavicular, axillary, or inguinal lymphadenopathy palpated.  LUNGS:  Clear to auscultation bilaterally.   HEART:  Regular rate and rhythm.  ABDOMEN:  Soft, nontender, nondistended. No masses/ascites/hepatomegaly  MSK:  No focal spinal tenderness to palpation. Full range of motion bilaterally in the upper extremities. EXTREMITIES:  No peripheral edema.   SKIN:  Clear with no obvious rashes or skin changes. No nail dyscrasia. NEURO:  Nonfocal. Well oriented.  Appropriate affect.   Pelvic: Exam chaperoned by Nursing EGBUS: no lesions Cervix: no lesions, nontender, mobile deviated to patient's left Vagina: no lesions, no discharge or bleeding Uterus: above limits normal size ~10 cm globular anteverted, slightly tender, mobile Adnexa: no palpable masses Rectovaginal: confirmatory   Lab Review Labs on site today: Recent Labs       Lab Results  Component Value Date    WBC 7.4  11/28/2021    HGB 9.3 (L) 11/28/2021    HCT 28.3 (L) 11/28/2021    MCV 92.2 11/28/2021    PLT 277 11/28/2021        Chemistry    Labs (Brief)          Component Value Date/Time    NA 135 05/22/2020 0804    K 4.0 05/22/2020 0804    CL 103 05/22/2020 0804    CO2 25 05/22/2020 0804    BUN 16 05/22/2020 0804    CREATININE 0.90 12/11/2021 1610    CREATININE 1.03 (H) 05/22/2020 0804      Labs (Brief)          Component Value Date/Time    CALCIUM 10.0 05/22/2020 0804    ALKPHOS 57 05/22/2020 0804    AST 17 05/22/2020 0804  ALT 11 05/22/2020 0804    BILITOT 0.3 05/22/2020 0804         09/22/2021  Vitamin B12 low 238 Ferritin and iron normal   TSH 3.007 WNL     Radiologic Imaging: As per HPI   CT CHEST ABDOMEN PELVIS W CONTRAST CLINICAL DATA:  Carcinosarcoma of the uterus.   EXAM: CT CHEST, ABDOMEN, AND PELVIS WITH CONTRAST   TECHNIQUE: Multidetector CT imaging of the chest, abdomen and pelvis was performed following the standard protocol during bolus administration of intravenous contrast.   RADIATION DOSE REDUCTION: This exam was performed according to the departmental dose-optimization program which includes automated exposure control, adjustment of the mA and/or kV according to patient size and/or use of iterative reconstruction technique.   CONTRAST:  43mL OMNIPAQUE IOHEXOL 300 MG/ML  SOLN   COMPARISON:  None.   FINDINGS: CT CHEST FINDINGS   Cardiovascular: The heart size is normal. No substantial pericardial effusion. Mild atherosclerotic calcification is noted in the wall of the thoracic aorta.   Mediastinum/Nodes: No mediastinal lymphadenopathy. Calcified nodal tissue in the subcarinal station suggests prior granulomatous disease. There is no hilar lymphadenopathy. The esophagus has normal imaging features. There is no axillary lymphadenopathy.   Lungs/Pleura: Biapical pleuroparenchymal scarring evident. Mild bronchiectasis with peripheral  airway impaction noted right middle lobe with small geographic area of tree-in-bud opacity noted right lower lobe (image 101/3). Calcified granuloma noted left lower lobe. There are several tiny ground-glass opacities in the left lower lobe, nonspecific. No pleural effusion.   Musculoskeletal: No worrisome lytic or sclerotic osseous abnormality.   CT ABDOMEN PELVIS FINDINGS   Hepatobiliary: No suspicious focal abnormality within the liver parenchyma. There is no evidence for gallstones, gallbladder wall thickening, or pericholecystic fluid. Common bile duct measures 5 mm diameter in the head of the pancreas, within normal limits.   Pancreas: No focal mass lesion. No dilatation of the main duct. No intraparenchymal cyst. No peripancreatic edema.   Spleen: No splenomegaly. No focal mass lesion. Calcified granulomata noted.   Adrenals/Urinary Tract: No adrenal nodule or mass. Kidneys unremarkable. No evidence for hydroureter. The urinary bladder appears normal for the degree of distention. Bladder is partially obscured by beam hardening artifact from what appears to be the patient's known pessary although the appearance is somewhat atypical in configuration.   Stomach/Bowel: Stomach is moderately distended with contrast material. Duodenum is normally positioned as is the ligament of Treitz. No small bowel wall thickening. No small bowel dilatation. The terminal ileum is normal. The appendix is not well visualized, but there is no edema or inflammation in the region of the cecum. No gross colonic mass. No colonic wall thickening.   Vascular/Lymphatic: There is moderate atherosclerotic calcification of the abdominal aorta without aneurysm. There is no gastrohepatic or hepatoduodenal ligament lymphadenopathy. No retroperitoneal or mesenteric lymphadenopathy. No pelvic sidewall lymphadenopathy.   Reproductive: Heterogeneously enhancing lesion identified anterior uterine fundus  measuring approximately 3.4 x 2.2 x 2.7 cm. Coarse dystrophic calcification is identified between the anterior lower uterine segment in the posterior bladder base and may represent an exophytic fibroid. Uterine and parametrial anatomy is not well evaluated by CT, but beam hardening artifact from the patient's ring-like metallic pessary essentially obscures the pelvic floor, cervix, and uterine/adnexal anatomy.   Other: No substantial intraperitoneal free fluid.   Musculoskeletal: No worrisome lytic or sclerotic osseous abnormality. Trace anterolisthesis of L4 on 5 evident.   IMPRESSION: 1. 3.4 x 2.2 x 2.7 cm heterogeneously enhancing lesion anterior uterine fundus, likely representing  known neoplasm. Uterine and parametrial anatomy is essentially obscured by beam hardening artifact from the patient's ring-like metallic pessary severely limiting assessment of the uterus, cervix, and adnexal anatomy. 2. No evidence for distant metastatic disease in the chest, abdomen, or pelvis. 3. Mild bronchiectasis with peripheral airway impaction noted right middle lobe with small geographic area of tree-in-bud opacity in the right lower lobe. Imaging features are most suggestive of an infectious/inflammatory etiology. Atypical infection (including MAI) could have this appearance. 4. Tiny ground-glass opacities in the left lower lobe, nonspecific but likely infectious/inflammatory. Attention on follow-up recommended. 5. Aortic Atherosclerosis (ICD10-I70.0).   Electronically Signed   By: Misty Stanley M.D.   On: 12/11/2021 17:01      Assessment:  Elizabeth Friedman is a 81 y.o. female diagnosed with carcinosarcoma of the uterus.    Abdominal pain and fatigue are concerning.    Pelvic organ prolapse, managed with pessary.    Vitamin B12 deficiency, s/p supplementation   Mild bronchiectasis, possible Atypical infection (including MAI) and tiny ground-glass opacities in the left lower lobe,  nonspecific.    Abnormal EKG   Medical co-morbidities complicating care: Frailty Plan:    Problem List Items Addressed This Visit   None Visit Diagnoses       Endometrial cancer ([place])    -  Primary    Relevant Orders    ECHOCARDIOGRAM COMPLETE    Counseling and coordination of care                 We discussed recommendations. CT findings are reassuring and we reviewed surgical management. We still are awaiting consultation results from Dr. Wannetta Sender which may alter our surgical plans. I tentatively posted her for surgery next week on 2/29/2023 with Dr. Fransisca Connors and either Dr. Marcelline Mates or Dr. Amalia Hailey. Plan for TLH, BSO, SLN injection/mapping/biopsies, EUA, washings with possible PPALND, possible laparotomy, pessary removal, possible laparotomy. I also added possible robotic approach if we need to change her surgical date to accomodate Dr. Wannetta Sender if [place] surgery is needed.    Risks were discussed in detail. These include infection, anesthesia, bleeding, transfusion, wound separation, vaginal cuff dehiscence, medical issues (blood clots, stroke, heart attack, fluid in the lungs, pneumonia, abnormal heart rhythm, death), possible exploratory surgery with larger incision, lymphedema, lymphocyst, allergic reaction, injury to adjacent organs (bowel, bladder, blood vessels, nerves, ureters, uterus).    Elizabeth Rutter, NP is to check if she is getting adequate B12 supplementation. She may need a transfusion preop or intraop given anemia.    Urogynecologic evaluation tomorrow.   Suggested return to clinic to be determined.    We will as her PCP and anesthesia to clear her for surgery. I request an ECHO given prior abnormal EKG.     Follow up pulmonary consult ordered by her PCP. I told the patient and her daughter that we typically repeat images in this situation for follow up.     The patient's diagnosis, an outline of the further diagnostic and laboratory studies which will be required, the  recommendation for surgery, and alternatives were discussed with her and her accompanying family members.  All questions were answered to their satisfaction.     Yovanna Cogan Gaetana Michaelis, MD    ADDENDUM: 12/22/2021  She was seen by Elizabeth Folds, MD last week and diagnosed with Stage II anterior, Stage I posterior, Stage I apical prolapse. Dr. Wannetta Sender was unable to fully appreciate prolapse due to "permanent" metal pessary in place. She recommended concurrent prolapse repair.  She reviewed the patient that she will need something for apical support but it is unclear what other procedures she will need until the pessary is removed. Plan for surgery is: Laparoscopic uterosacral ligament suspension, possible anterior and posterior repair, cystoscopy  12/19/2021 Urinalysis positive for 2+ leukocytes; urine culture  <10,000 COLONIES/mL INSIGNIFICANT GROWTH   Echo completed today, 12/22/2021 but results not yet released.   Plan for surgery; follow up ECHO; preop counseling performed by Ernestina Columbia RN; orders placed.   Anjuli Gemmill Gaetana Michaelis, MD           CC:  Dr. Rubie Maid

## 2021-12-22 NOTE — Progress Notes (Signed)
*  PRELIMINARY RESULTS* Echocardiogram 2D Echocardiogram has been performed.  Elizabeth Friedman 12/22/2021, 11:44 AM

## 2021-12-22 NOTE — Progress Notes (Signed)
°  [facility] Perioperative Services: Pre-Admission/Anesthesia Testing  Abnormal Lab Notification   Date: 12/22/21  Name: Elizabeth Friedman MRN:   [id number]  Re: Abnormal labs noted during PAT appointment   Notified:  Provider Name Provider Role Notification Mode  Mellody Drown, MD GYN ONCOLOGY Routed and/or faxed via [place] and Notes:  ABNORMAL LAB VALUE(S): Lab Results  Component Value Date   HGB 9.6 (L) 12/22/2021   HCT 29.4 (L) 12/22/2021   NA 127 (L) 12/22/2021    [name] is scheduled for a TOTAL LAPAROSCOPIC HYSTERECTOMY WITH BILATERAL SALPINGO OOPHORECTOMY, PELVIC NODE BIOPSY, POSSIBLE PELVIC OR PARA-AORTIC NODE DISSECTION, POSSIBLE LAPAROTOMY, POSSIBLE ROBOTIC APPROACH, POSSIBLE PESSARY REMOVAL on 12/24/2021.   This is a Community education officer; no formal response is required.  Honor Loh, MSN, APRN, FNP-C, CEN [facility]  Peri-operative Services Nurse Practitioner Phone: [phone] Fax: [phone] 12/22/21 4:16 PM

## 2021-12-22 NOTE — Addendum Note (Signed)
Addended by: Gillis Ends on: 12/22/2021 11:10 AM   Modules accepted: Orders

## 2021-12-22 NOTE — H&P (View-Only) (Signed)
Gynecologic Oncology Interval Visit    Referring Provider: Dr. Marcelline Mates   Chief Complaint: Carcinosarcoma   Subjective:  Elizabeth Friedman is a 81 y.o. female who is seen in consultation from Dr. Marcelline Mates for lower pelvic pain x 1 month and uterine carcinosarcoma. She was initially seen by GI but pain persisted and she was referred to gynecology. She has hx of Gehrung pessary/permanent pessary (20 years).    She presents to discuss her CT scan which is reassuring. She has an UroGyn appt tomorrow with Dr. Wannetta Sender regarding her pessary.    Her PCP recommended a pulmonary consultation given the CT pulmonary findings.    She had an abnormal EKG on 11/28/2021 that showed PVS, nonspecific ST and T wave abnormalities.    Gynecologic Oncology History  Elizabeth Friedman is a pleasant female who is seen in consultation from Dr. Marcelline Mates for lower pelvic pain x 1 month and uterine carcinosarcoma.    US Pelvis  The uterus is anteverted and measures 9.6 X 7.1 X 5.5 cm. Echo texture is heterogenous with evidence of focal mass in the Endometrium.   The Endometrium measures 35 mm.  There is an echogenic,  mixed echogenicity mass in the endometrial canal ,measuring: 3.7 x 4.1 x 3.3 cm; This is hyperemic and has a small amount of fluid surrounding it within the endometrial cavity.   Right Ovary is not visualized. Left Ovary measures 2.8 x  2.7 x 1.5 cm. It is normal in appearance. Survey of the adnexae demonstrates no adnexal masses. There is no free fluid in the cul de sac.   She began having vaginal spotting and brown discharge. Abdominal pain increased.    Underwent hysteroscopy with removal of endometrial mass and D&C.    DIAGNOSIS: A. ENDOMETRIUM; CURETTAGE: - CARCINOSARCOMA / MALIGNANT MIXED MULLERIAN TUMOR.   B. ENDOMETRIAL MASS; CURETTAGE: - CARCINOSARCOMA / MALIGNANT MIXED MULLERIAN TUMOR.   PMH significant for DCIS of right breast s/p lumpectomy and radiation in 2021. Patient previously followed by  Dr. Jana Hakim at [facility] but has transferred care to Dr. Rogue Bussing at [facility] and next appointment in May 2023.    She complains of persistent of GERD-like symptoms and epigastric discomfort since her hysteroscopy. Incomplete emptying of her bladder. She wears a pessary that has not been removed in years.    Problem List:     Patient Active Problem List    Diagnosis Date Noted   Lung granuloma ([place]) 12/10/2021   B12 deficiency 09/25/2021   History of ductal carcinoma in situ (DCIS) of breast 09/10/2020   Iron deficiency anemia 09/10/2020   Ductal carcinoma in situ (DCIS) of right breast 05/17/2020   Bilateral carotid artery stenosis 02/06/2020   Visual changes 01/12/2020   Carotid stenosis 01/11/2020   Migraine aura without headache 03/07/2019   Complete tear of left rotator cuff 03/10/2017   Pessary maintenance 12/18/2016   Rectocele 12/18/2016   Midline cystocele 12/18/2016   Menopausal syndrome on hormone replacement therapy 12/18/2016   Allergic rhinitis 12/17/2015   Atypical chest pain 12/17/2015   Bloodgood disease 12/17/2015   Acid reflux 12/17/2015   HLD (hyperlipidemia) 12/17/2015   Lung mass 12/17/2015   Uterine prolapse 12/17/2015   CA of skin 12/17/2015   Post menopausal syndrome 01/14/2015   Incomplete bladder emptying 01/14/2015   Neuralgia neuritis, sciatic nerve 01/14/2015   H/O malignant neoplasm of skin 09/17/2014      Past Medical History:     Past Medical History:  Diagnosis Date   Allergic rhinitis     Anemia     Anginal pain (HCC)     Arthritis     Atypical chest pain     Breast cancer (HCC)     Cancer (HCC)      skin   Fibrocystic breast disease     GERD (gastroesophageal reflux disease)     Hearing aid worn      bilateral   History of hiatal hernia     Hyperlipidemia     Lung nodule     Menopause     Migraine     Migraine      optical migraines   Motion sickness     Plantar fasciitis     Prolapse of female pelvic organs       hodge pessary   Raynaud's disease     Sciatic leg pain     Sciatica     Urinary retention with incomplete bladder emptying     Vitreous detachment of both eyes        Past Surgical History:      Past Surgical History:  Procedure Laterality Date   APPENDECTOMY       BREAST LUMPECTOMY WITH RADIOACTIVE SEED LOCALIZATION Right 05/29/2020    Procedure: RIGHT BREAST LUMPECTOMY WITH RADIOACTIVE SEED LOCALIZATION;  Surgeon: Rolm Bookbinder, MD;  Location: [place];  Service: General;  Laterality: Right;   CARDIAC CATHETERIZATION       CATARACT EXTRACTION W/PHACO Right 02/26/2021    Procedure: CATARACT EXTRACTION PHACO AND INTRAOCULAR LENS PLACEMENT (IOC) RIGHT 5.86 01:19.6 7.4%;  Surgeon: Leandrew Koyanagi, MD;  Location: [place];  Service: Ophthalmology;  Laterality: Right;   CATARACT EXTRACTION W/PHACO Left 09/03/2021    Procedure: CATARACT EXTRACTION PHACO AND INTRAOCULAR LENS PLACEMENT ([place]) LEFT;  Surgeon: Leandrew Koyanagi, MD;  Location: [place];  Service: Ophthalmology;  Laterality: Left;  9.61 01:15.9   COLONOSCOPY WITH PROPOFOL N/A 03/27/2016    Procedure: COLONOSCOPY WITH PROPOFOL;  Surgeon: Manya Silvas, MD;  Location: [facility];  Service: Endoscopy;  Laterality: N/A;   COLONOSCOPY WITH PROPOFOL N/A 08/14/2020    Procedure: COLONOSCOPY WITH PROPOFOL;  Surgeon: Toledo, Benay Pike, MD;  Location: [facility];  Service: Gastroenterology;  Laterality: N/A;   DILATATION & CURETTAGE/HYSTEROSCOPY WITH MYOSURE N/A 12/01/2021    Procedure: DILATATION & CURETTAGE/HYSTEROSCOPY WITH MYOSURE;  Surgeon: Rubie Maid, MD;  Location: [facility];  Service: Gynecology;  Laterality: N/A;   ESOPHAGOGASTRODUODENOSCOPY (EGD) WITH PROPOFOL N/A 08/14/2020    Procedure: ESOPHAGOGASTRODUODENOSCOPY (EGD) WITH PROPOFOL;  Surgeon: Toledo, Benay Pike, MD;  Location: [facility];  Service: Gastroenterology;  Laterality: N/A;   ESOPHAGOGASTRODUODENOSCOPY (EGD)  WITH PROPOFOL N/A 03/21/2021    Procedure: ESOPHAGOGASTRODUODENOSCOPY (EGD) WITH PROPOFOL;  Surgeon: Toledo, Benay Pike, MD;  Location: [facility];  Service: Gastroenterology;  Laterality: N/A;   SKIN CANCER EXCISION        face and neck      Past Gynecologic History:  As per HPI   OB History:                  OB History  Gravida Para Term Preterm AB Living  2 2 2     2   SAB IAB Ectopic Multiple Live Births             2        # Outcome Date GA Lbr Len/2nd Weight Sex Delivery Anes PTL Lv  2 Term 1964  F Vag-Spont     LIV  1 Term 20       F Vag-Spont     LIV      Family History:      Family History  Problem Relation Age of Onset   Breast cancer Mother     Heart disease Father     Colon cancer Brother     Diabetes Neg Hx     Ovarian cancer Neg Hx        Social History: Social History         Socioeconomic History   Marital status: Widowed      Spouse name: Not on file   Number of children: Not on file   Years of education: Not on file   Highest education level: Not on file  Occupational History   Not on file  Tobacco Use   Smoking status: Never   Smokeless tobacco: Never  Vaping Use   Vaping Use: Never used  Substance and Sexual Activity   Alcohol use: No   Drug use: No   Sexual activity: Not Currently      Birth control/protection: Post-menopausal  Other Topics Concern   Not on file  Social History Narrative   Not on file    Social Determinants of Health    Financial Resource Strain: Not on file  Food Insecurity: Not on file  Transportation Needs: Not on file  Physical Activity: Not on file  Stress: Not on file  Social Connections: Not on file  Intimate Partner Violence: Not on file      Allergies: No Known Allergies   Current Medications:       Current Outpatient Medications  Medication Sig Dispense Refill   acetaminophen (TYLENOL) 500 MG tablet Take 1,000 mg by mouth every 6 (six) hours as needed for moderate pain or mild pain.        alum & mag hydroxide-simeth (MAALOX/MYLANTA) 200-200-20 MG/5ML suspension Take 30 mLs by mouth every 6 (six) hours as needed for indigestion or heartburn.       Calcium-Vitamin D (CALTRATE 600 PLUS-VIT D PO) Take 1 tablet by mouth daily.       celecoxib (CELEBREX) 200 MG capsule Take 200 mg by mouth 2 (two) times daily.       Cholecalciferol (VITAMIN D) 125 MCG (5000 UT) CAPS Take 5,000 Units by mouth daily.       Coenzyme Q10 200 MG TABS Take 200 mg by mouth daily.       Cyanocobalamin (VITAMIN B-12 PO) Take 1 tablet by mouth daily.       gabapentin (NEURONTIN) 100 MG capsule Take 100 mg by mouth 4 (four) times daily.       ibuprofen (ADVIL) 600 MG tablet Take 1 tablet (600 mg total) by mouth every 6 (six) hours as needed. 30 tablet 0   ILEVRO 0.3 % ophthalmic suspension         Iron-Vitamin C (VITRON-C) 65-125 MG TABS Take 1 tablet by mouth daily.       Omega-3 Fatty Acids (FISH OIL PO) Take 1,400 mg by mouth daily.       pantoprazole (PROTONIX) 40 MG tablet Take 40 mg by mouth every morning.       RESTASIS 0.05 % ophthalmic emulsion Place 1 drop into both eyes 2 (two) times daily.       simvastatin (ZOCOR) 10 MG tablet Take 10 mg by mouth at bedtime.       topiramate (TOPAMAX) 50 MG  tablet Take 50 mg by mouth at bedtime.       traMADol (ULTRAM) 50 MG tablet Take 1-2 tablets (50-100 mg total) by mouth every 6 (six) hours as needed. 20 tablet 0   Triamcinolone Acetonide (NASACORT ALLERGY 24HR NA) Place 1 spray into the nose daily as needed (sinus).       VIGAMOX 0.5 % ophthalmic solution         vitamin E 180 MG (400 UNITS) capsule Take 400 Units by mouth daily.        No current facility-administered medications for this visit.    Review of Systems General: negative for fevers, changes in weight or night sweats Skin: negative for changes in moles or sores or rash Eyes: negative for changes in vision HEENT: hearing aids; negative for change in hearing, tinnitus, voice  changes Pulmonary: negative for dyspnea, orthopnea, productive cough, wheezing Cardiac: negative for palpitations, pain Gastrointestinal: abdominal pain and poor appetite, she attributes to a medication she is taking; negative for nausea, vomiting, constipation, diarrhea, hematemesis, hematochezia Genitourinary/Sexual:  urinary incontinence o/w negative for dysuria, retention, hematuria, Ob/Gyn:  negative for abnormal bleeding, or pain Musculoskeletal: joint pain,negative for back pain Hematology: negative for easy bruising, abnormal bleeding Neurologic/Psych: negative for headaches, seizures, paralysis, weakness, numbness   Objective:  Physical Examination:  BP (!) 148/63    Pulse 66    Temp 98.7 F (37.1 C)    Resp 20    Wt 102 lb 1.6 oz (46.3 kg)    SpO2 100%    BMI 18.67 kg/m     ECOG Performance Status: 0 Exam deferred from 12/10/2021   GENERAL: Patient is a well appearing female in no acute distress HEENT:  PERRL, neck supple with midline trachea. Thyroid without masses.  NODES:  No cervical, supraclavicular, axillary, or inguinal lymphadenopathy palpated.  LUNGS:  Clear to auscultation bilaterally.   HEART:  Regular rate and rhythm.  ABDOMEN:  Soft, nontender, nondistended. No masses/ascites/hepatomegaly  MSK:  No focal spinal tenderness to palpation. Full range of motion bilaterally in the upper extremities. EXTREMITIES:  No peripheral edema.   SKIN:  Clear with no obvious rashes or skin changes. No nail dyscrasia. NEURO:  Nonfocal. Well oriented.  Appropriate affect.   Pelvic: Exam chaperoned by Nursing EGBUS: no lesions Cervix: no lesions, nontender, mobile deviated to patient's left Vagina: no lesions, no discharge or bleeding Uterus: above limits normal size ~10 cm globular anteverted, slightly tender, mobile Adnexa: no palpable masses Rectovaginal: confirmatory   Lab Review Labs on site today: Recent Labs       Lab Results  Component Value Date    WBC 7.4  11/28/2021    HGB 9.3 (L) 11/28/2021    HCT 28.3 (L) 11/28/2021    MCV 92.2 11/28/2021    PLT 277 11/28/2021        Chemistry    Labs (Brief)          Component Value Date/Time    NA 135 05/22/2020 0804    K 4.0 05/22/2020 0804    CL 103 05/22/2020 0804    CO2 25 05/22/2020 0804    BUN 16 05/22/2020 0804    CREATININE 0.90 12/11/2021 1610    CREATININE 1.03 (H) 05/22/2020 0804      Labs (Brief)          Component Value Date/Time    CALCIUM 10.0 05/22/2020 0804    ALKPHOS 57 05/22/2020 0804    AST 17 05/22/2020 0804  ALT 11 05/22/2020 0804    BILITOT 0.3 05/22/2020 0804         09/22/2021  Vitamin B12 low 238 Ferritin and iron normal   TSH 3.007 WNL     Radiologic Imaging: As per HPI   CT CHEST ABDOMEN PELVIS W CONTRAST CLINICAL DATA:  Carcinosarcoma of the uterus.   EXAM: CT CHEST, ABDOMEN, AND PELVIS WITH CONTRAST   TECHNIQUE: Multidetector CT imaging of the chest, abdomen and pelvis was performed following the standard protocol during bolus administration of intravenous contrast.   RADIATION DOSE REDUCTION: This exam was performed according to the departmental dose-optimization program which includes automated exposure control, adjustment of the mA and/or kV according to patient size and/or use of iterative reconstruction technique.   CONTRAST:  37mL OMNIPAQUE IOHEXOL 300 MG/ML  SOLN   COMPARISON:  None.   FINDINGS: CT CHEST FINDINGS   Cardiovascular: The heart size is normal. No substantial pericardial effusion. Mild atherosclerotic calcification is noted in the wall of the thoracic aorta.   Mediastinum/Nodes: No mediastinal lymphadenopathy. Calcified nodal tissue in the subcarinal station suggests prior granulomatous disease. There is no hilar lymphadenopathy. The esophagus has normal imaging features. There is no axillary lymphadenopathy.   Lungs/Pleura: Biapical pleuroparenchymal scarring evident. Mild bronchiectasis with peripheral  airway impaction noted right middle lobe with small geographic area of tree-in-bud opacity noted right lower lobe (image 101/3). Calcified granuloma noted left lower lobe. There are several tiny ground-glass opacities in the left lower lobe, nonspecific. No pleural effusion.   Musculoskeletal: No worrisome lytic or sclerotic osseous abnormality.   CT ABDOMEN PELVIS FINDINGS   Hepatobiliary: No suspicious focal abnormality within the liver parenchyma. There is no evidence for gallstones, gallbladder wall thickening, or pericholecystic fluid. Common bile duct measures 5 mm diameter in the head of the pancreas, within normal limits.   Pancreas: No focal mass lesion. No dilatation of the main duct. No intraparenchymal cyst. No peripancreatic edema.   Spleen: No splenomegaly. No focal mass lesion. Calcified granulomata noted.   Adrenals/Urinary Tract: No adrenal nodule or mass. Kidneys unremarkable. No evidence for hydroureter. The urinary bladder appears normal for the degree of distention. Bladder is partially obscured by beam hardening artifact from what appears to be the patient's known pessary although the appearance is somewhat atypical in configuration.   Stomach/Bowel: Stomach is moderately distended with contrast material. Duodenum is normally positioned as is the ligament of Treitz. No small bowel wall thickening. No small bowel dilatation. The terminal ileum is normal. The appendix is not well visualized, but there is no edema or inflammation in the region of the cecum. No gross colonic mass. No colonic wall thickening.   Vascular/Lymphatic: There is moderate atherosclerotic calcification of the abdominal aorta without aneurysm. There is no gastrohepatic or hepatoduodenal ligament lymphadenopathy. No retroperitoneal or mesenteric lymphadenopathy. No pelvic sidewall lymphadenopathy.   Reproductive: Heterogeneously enhancing lesion identified anterior uterine fundus  measuring approximately 3.4 x 2.2 x 2.7 cm. Coarse dystrophic calcification is identified between the anterior lower uterine segment in the posterior bladder base and may represent an exophytic fibroid. Uterine and parametrial anatomy is not well evaluated by CT, but beam hardening artifact from the patient's ring-like metallic pessary essentially obscures the pelvic floor, cervix, and uterine/adnexal anatomy.   Other: No substantial intraperitoneal free fluid.   Musculoskeletal: No worrisome lytic or sclerotic osseous abnormality. Trace anterolisthesis of L4 on 5 evident.   IMPRESSION: 1. 3.4 x 2.2 x 2.7 cm heterogeneously enhancing lesion anterior uterine fundus, likely representing  known neoplasm. Uterine and parametrial anatomy is essentially obscured by beam hardening artifact from the patient's ring-like metallic pessary severely limiting assessment of the uterus, cervix, and adnexal anatomy. 2. No evidence for distant metastatic disease in the chest, abdomen, or pelvis. 3. Mild bronchiectasis with peripheral airway impaction noted right middle lobe with small geographic area of tree-in-bud opacity in the right lower lobe. Imaging features are most suggestive of an infectious/inflammatory etiology. Atypical infection (including MAI) could have this appearance. 4. Tiny ground-glass opacities in the left lower lobe, nonspecific but likely infectious/inflammatory. Attention on follow-up recommended. 5. Aortic Atherosclerosis (ICD10-I70.0).   Electronically Signed   By: Misty Stanley M.D.   On: 12/11/2021 17:01      Assessment:  JAMAR CASAGRANDE is a 81 y.o. female diagnosed with carcinosarcoma of the uterus.    Abdominal pain and fatigue are concerning.    Pelvic organ prolapse, managed with pessary.    Vitamin B12 deficiency, s/p supplementation   Mild bronchiectasis, possible Atypical infection (including MAI) and tiny ground-glass opacities in the left lower lobe,  nonspecific.    Abnormal EKG   Medical co-morbidities complicating care: Frailty Plan:    Problem List Items Addressed This Visit   None Visit Diagnoses       Endometrial cancer ([place])    -  Primary    Relevant Orders    ECHOCARDIOGRAM COMPLETE    Counseling and coordination of care                 We discussed recommendations. CT findings are reassuring and we reviewed surgical management. We still are awaiting consultation results from Dr. Wannetta Sender which may alter our surgical plans. I tentatively posted her for surgery next week on 2/29/2023 with Dr. Fransisca Connors and either Dr. Marcelline Mates or Dr. Amalia Hailey. Plan for TLH, BSO, SLN injection/mapping/biopsies, EUA, washings with possible PPALND, possible laparotomy, pessary removal, possible laparotomy. I also added possible robotic approach if we need to change her surgical date to accomodate Dr. Wannetta Sender if [place] surgery is needed.    Risks were discussed in detail. These include infection, anesthesia, bleeding, transfusion, wound separation, vaginal cuff dehiscence, medical issues (blood clots, stroke, heart attack, fluid in the lungs, pneumonia, abnormal heart rhythm, death), possible exploratory surgery with larger incision, lymphedema, lymphocyst, allergic reaction, injury to adjacent organs (bowel, bladder, blood vessels, nerves, ureters, uterus).    [name], NP is to check if she is getting adequate B12 supplementation. She may need a transfusion preop or intraop given anemia.    Urogynecologic evaluation tomorrow.   Suggested return to clinic to be determined.    We will as her PCP and anesthesia to clear her for surgery. I request an ECHO given prior abnormal EKG.     Follow up pulmonary consult ordered by her PCP. I told the patient and her daughter that we typically repeat images in this situation for follow up.     The patient's diagnosis, an outline of the further diagnostic and laboratory studies which will be required, the  recommendation for surgery, and alternatives were discussed with her and her accompanying family members.  All questions were answered to their satisfaction.     Cabela Pacifico Gaetana Michaelis, MD    ADDENDUM: 12/22/2021  She was seen by Jaquita Folds, MD last week and diagnosed with Stage II anterior, Stage I posterior, Stage I apical prolapse. Dr. Wannetta Sender was unable to fully appreciate prolapse due to "permanent" metal pessary in place. She recommended concurrent prolapse repair.  She reviewed the patient that she will need something for apical support but it is unclear what other procedures she will need until the pessary is removed. Plan for surgery is: Laparoscopic uterosacral ligament suspension, possible anterior and posterior repair, cystoscopy  12/19/2021 Urinalysis positive for 2+ leukocytes; urine culture  <10,000 COLONIES/mL INSIGNIFICANT GROWTH   Echo completed today, 12/22/2021 but results not yet released.   Plan for surgery; follow up ECHO; preop counseling performed by Ernestina Columbia RN; orders placed.   Bernita Beckstrom Gaetana Michaelis, MD           CC:  Dr. Rubie Maid

## 2021-12-23 NOTE — H&P (Signed)
[facility] Pre-Operative H&P  Subjective Chief Complaint: [name] presents for a preoperative encounter.   History of Present Illness: Elizabeth Friedman is a 81 y.o. female with uterine carcinosarcoma and prolapse. She has had a gehrung metal pessary in place.   Past Medical History:  Diagnosis Date   Allergic rhinitis    Anemia    Anginal pain (HCC)    Arthritis    Atypical chest pain    Breast cancer ([place])    Ductal carcinoma in situ (DCIS) of right breast    Fibrocystic breast disease    GERD (gastroesophageal reflux disease)    Hearing aid worn    bilateral   History of hiatal hernia    Hyperlipidemia    Lung nodule    Menopause    Migraine    Migraine    optical migraines   Motion sickness    Plantar fasciitis    Prolapse of female pelvic organs    hodge pessary   Raynaud's disease    Sciatic leg pain    Sciatica    Skin cancer of face    Urinary retention with incomplete bladder emptying    Uterine carcinosarcoma ([place]) 11/2021   Vitreous detachment of both eyes      Past Surgical History:  Procedure Laterality Date   APPENDECTOMY  1951   BREAST LUMPECTOMY WITH RADIOACTIVE SEED LOCALIZATION Right 05/29/2020   Procedure: RIGHT BREAST LUMPECTOMY WITH RADIOACTIVE SEED LOCALIZATION;  Surgeon: Rolm Bookbinder, MD;  Location: [facility];  Service: General;  Laterality: Right;   CARDIAC CATHETERIZATION  2004   negative   CATARACT EXTRACTION W/PHACO Right 02/26/2021   Procedure: CATARACT EXTRACTION PHACO AND INTRAOCULAR LENS PLACEMENT ([facility]) RIGHT 5.86 01:19.6 7.4%;  Surgeon: Leandrew Koyanagi, MD;  Location: [facility];  Service: Ophthalmology;  Laterality: Right;   CATARACT EXTRACTION W/PHACO Left 09/03/2021   Procedure: CATARACT EXTRACTION PHACO AND INTRAOCULAR LENS PLACEMENT ([place]) LEFT;  Surgeon: Leandrew Koyanagi, MD;  Location: [facility];  Service: Ophthalmology;  Laterality: Left;  9.61 01:15.9    COLONOSCOPY     2000, 2006, 2012   COLONOSCOPY WITH PROPOFOL N/A 03/27/2016   Procedure: COLONOSCOPY WITH PROPOFOL;  Surgeon: Manya Silvas, MD;  Location: [facility]COPY;  Service: Endoscopy;  Laterality: N/A;   COLONOSCOPY WITH PROPOFOL N/A 08/14/2020   Procedure: COLONOSCOPY WITH PROPOFOL;  Surgeon: Toledo, Benay Pike, MD;  Location: [facility] ENDOSCOPY;  Service: Gastroenterology;  Laterality: N/A;   DILATATION & CURETTAGE/HYSTEROSCOPY WITH MYOSURE N/A 12/01/2021   Procedure: DILATATION & CURETTAGE/HYSTEROSCOPY WITH MYOSURE;  Surgeon: Rubie Maid, MD;  Location: [facility];  Service: Gynecology;  Laterality: N/A;   ESOPHAGOGASTRODUODENOSCOPY  08/1999   ESOPHAGOGASTRODUODENOSCOPY (EGD) WITH PROPOFOL N/A 08/14/2020   Procedure: ESOPHAGOGASTRODUODENOSCOPY (EGD) WITH PROPOFOL;  Surgeon: Toledo, Benay Pike, MD;  Location: [facility] ENDOSCOPY;  Service: Gastroenterology;  Laterality: N/A;   ESOPHAGOGASTRODUODENOSCOPY (EGD) WITH PROPOFOL N/A 03/21/2021   Procedure: ESOPHAGOGASTRODUODENOSCOPY (EGD) WITH PROPOFOL;  Surgeon: Toledo, Benay Pike, MD;  Location: [facility] ENDOSCOPY;  Service: Gastroenterology;  Laterality: N/A;   SKIN CANCER EXCISION     face and neck    has No Known Allergies.   Family History  Problem Relation Age of Onset   Breast cancer Mother    Heart disease Father    Colon cancer Brother    Diabetes Neg Hx    Ovarian cancer Neg Hx     Social History   Tobacco Use   Smoking status: Never   Smokeless tobacco:  Never  Vaping Use   Vaping Use: Never used  Substance Use Topics   Alcohol use: No   Drug use: No     Review of Systems was negative for a full 10 system review except as noted in the History of Present Illness.  No current facility-administered medications for this encounter.  Current Outpatient Medications:    acetaminophen (TYLENOL) 500 MG tablet, Take 1,000 mg by mouth every 6 (six) hours as needed for moderate pain or mild pain., Disp: , Rfl:    alum & mag  hydroxide-simeth (MAALOX/MYLANTA) 200-200-20 MG/5ML suspension, Take 30 mLs by mouth every 6 (six) hours as needed for indigestion or heartburn., Disp: , Rfl:    Calcium-Vitamin D (CALTRATE 600 PLUS-VIT D PO), Take 1 tablet by mouth in the morning., Disp: , Rfl:    Carboxymethylcellulose Sod PF (REFRESH CELLUVISC) 1 % GEL, Place 1 drop into both eyes at bedtime., Disp: , Rfl:    celecoxib (CELEBREX) 200 MG capsule, Take 200 mg by mouth 2 (two) times daily., Disp: , Rfl:    Cholecalciferol (VITAMIN D) 125 MCG (5000 UT) CAPS, Take 5,000 Units by mouth in the morning., Disp: , Rfl:    Coenzyme Q10 200 MG TABS, Take 200 mg by mouth in the morning., Disp: , Rfl:    Cyanocobalamin (VITAMIN B-12 PO), Take 1 tablet by mouth in the morning., Disp: , Rfl:    gabapentin (NEURONTIN) 100 MG capsule, Take 100 mg by mouth 4 (four) times daily., Disp: , Rfl:    Iron-Vitamin C (VITRON-C) 65-125 MG TABS, Take 1 tablet by mouth in the morning., Disp: , Rfl:    Omega-3 Fatty Acids (FISH OIL PO), Take 1,400 mg by mouth in the morning., Disp: , Rfl:    omeprazole (PRILOSEC) 40 MG capsule, Take 40 mg by mouth daily in the afternoon., Disp: , Rfl:    pantoprazole (PROTONIX) 40 MG tablet, Take 40 mg by mouth in the morning., Disp: , Rfl:    RESTASIS 0.05 % ophthalmic emulsion, Place 1 drop into both eyes 2 (two) times daily., Disp: , Rfl:    simvastatin (ZOCOR) 10 MG tablet, Take 10 mg by mouth every evening., Disp: , Rfl:    topiramate (TOPAMAX) 50 MG tablet, Take 50 mg by mouth every evening., Disp: , Rfl:    traMADol (ULTRAM) 50 MG tablet, Take 1-2 tablets (50-100 mg total) by mouth every 6 (six) hours as needed., Disp: 20 tablet, Rfl: 0   Triamcinolone Acetonide (NASACORT ALLERGY 24HR NA), Place 1 spray into the nose daily as needed (sinus congestion)., Disp: , Rfl:    vitamin E 180 MG (400 UNITS) capsule, Take 400 Units by mouth in the morning., Disp: , Rfl:    Objective   12/18/21 1325  BP: (!) 166/89  Pulse: 91   Weight: 102 lb (46.3 kg)  Height: 5\' 1"  (1.549 m)      Physical Exam Constitutional:      General: She is not in acute distress. Pulmonary:     Effort: Pulmonary effort is normal.  Abdominal:     General: There is no distension.     Palpations: Abdomen is soft.     Tenderness: There is no abdominal tenderness. There is no rebound.  Musculoskeletal:        General: No swelling. Normal range of motion.  Skin:    General: Skin is warm and dry.     Findings: No rash.  Neurological:     Mental Status: She  is alert and oriented to person, place, and time.  Psychiatric:        Mood and Affect: Mood normal.        Behavior: Behavior normal.   CST: catheter inserted and bladder backfilled until patient felt full (482ml). Cough stress test was negative. Bladder was drained with the catheter.    Speculum exam reveals metal pessary in place. Unable to fully appreciate condition of vaginal mucosa but no obvious erosions present.  Cervix normal appearance. Uterus normal single, nontender. Adnexa no mass, fullness, tenderness.     POP-Q   -1                                            Aa   -1                                           Ba   -5                                              C    2                                            Gh   4                                            Pb   8                                            tvl    -2                                            Ap   -2                                            Bp   -7.5                                              D       Assessment/ Plan   The patient is a 81 y.o. year old with prolapse scheduled to undergo Laparoscopic uterosacral ligament suspension, possible anterior and posterior repair, cystoscopy.      Jaquita Folds, MD

## 2021-12-24 ENCOUNTER — Ambulatory Visit: Payer: PPO | Admitting: Urgent Care

## 2021-12-24 ENCOUNTER — Other Ambulatory Visit: Payer: Self-pay

## 2021-12-24 ENCOUNTER — Encounter
Admission: RE | Disposition: A | Discharge status: Home / Self Care | Payer: Self-pay | Source: Ambulatory Visit | Attending: Obstetrics and Gynecology

## 2021-12-24 ENCOUNTER — Observation Stay
Admission: RE | Admit: 2021-12-24 | Discharge: 2021-12-25 | Disposition: A | Discharge status: Home / Self Care | Payer: PPO | Source: Ambulatory Visit | Attending: Obstetrics and Gynecology | Admitting: Obstetrics and Gynecology

## 2021-12-24 ENCOUNTER — Encounter: Payer: PPO | Admitting: Obstetrics and Gynecology

## 2021-12-24 ENCOUNTER — Encounter: Payer: Self-pay | Admitting: Obstetrics and Gynecology

## 2021-12-24 DIAGNOSIS — Z20822 Contact with and (suspected) exposure to covid-19: Secondary | ICD-10-CM | POA: Insufficient documentation

## 2021-12-24 DIAGNOSIS — D270 Benign neoplasm of right ovary: Secondary | ICD-10-CM | POA: Insufficient documentation

## 2021-12-24 DIAGNOSIS — C541 Malignant neoplasm of endometrium: Principal | ICD-10-CM

## 2021-12-24 DIAGNOSIS — D271 Benign neoplasm of left ovary: Secondary | ICD-10-CM | POA: Insufficient documentation

## 2021-12-24 DIAGNOSIS — Z85828 Personal history of other malignant neoplasm of skin: Secondary | ICD-10-CM | POA: Insufficient documentation

## 2021-12-24 DIAGNOSIS — Z800 Family history of malignant neoplasm of digestive organs: Secondary | ICD-10-CM | POA: Insufficient documentation

## 2021-12-24 DIAGNOSIS — J479 Bronchiectasis, uncomplicated: Secondary | ICD-10-CM | POA: Insufficient documentation

## 2021-12-24 DIAGNOSIS — C55 Malignant neoplasm of uterus, part unspecified: Secondary | ICD-10-CM | POA: Diagnosis not present

## 2021-12-24 DIAGNOSIS — N812 Incomplete uterovaginal prolapse: Secondary | ICD-10-CM

## 2021-12-24 DIAGNOSIS — N814 Uterovaginal prolapse, unspecified: Secondary | ICD-10-CM | POA: Diagnosis not present

## 2021-12-24 DIAGNOSIS — R102 Pelvic and perineal pain: Secondary | ICD-10-CM | POA: Diagnosis present

## 2021-12-24 DIAGNOSIS — Z803 Family history of malignant neoplasm of breast: Secondary | ICD-10-CM | POA: Insufficient documentation

## 2021-12-24 DIAGNOSIS — R9431 Abnormal electrocardiogram [ECG] [EKG]: Secondary | ICD-10-CM | POA: Insufficient documentation

## 2021-12-24 DIAGNOSIS — K219 Gastro-esophageal reflux disease without esophagitis: Secondary | ICD-10-CM | POA: Diagnosis not present

## 2021-12-24 DIAGNOSIS — D259 Leiomyoma of uterus, unspecified: Secondary | ICD-10-CM | POA: Insufficient documentation

## 2021-12-24 DIAGNOSIS — Z9889 Other specified postprocedural states: Secondary | ICD-10-CM

## 2021-12-24 DIAGNOSIS — Z853 Personal history of malignant neoplasm of breast: Secondary | ICD-10-CM | POA: Diagnosis not present

## 2021-12-24 HISTORY — PX: CYSTOSCOPY: SHX5120

## 2021-12-24 HISTORY — PX: TOTAL LAPAROSCOPIC HYSTERECTOMY WITH BILATERAL SALPINGO OOPHORECTOMY: SHX6845

## 2021-12-24 LAB — ABO/RH: ABO/RH(D): O POS

## 2021-12-24 LAB — RESP PANEL BY RT-PCR (FLU A&B, COVID) ARPGX2
Influenza A by PCR: NEGATIVE
Influenza B by PCR: NEGATIVE
SARS Coronavirus 2 by RT PCR: NEGATIVE

## 2021-12-24 SURGERY — HYSTERECTOMY, TOTAL, LAPAROSCOPIC, WITH BILATERAL SALPINGO-OOPHORECTOMY
Anesthesia: General

## 2021-12-24 MED ORDER — FENTANYL CITRATE (PF) 100 MCG/2ML IJ SOLN
INTRAMUSCULAR | Status: DC | PRN
  Administered 2021-12-24 (×2): 50 ug via INTRAVENOUS

## 2021-12-24 MED ORDER — ACETAMINOPHEN 10 MG/ML IV SOLN: 1000.0000 mg | Freq: Once | INTRAVENOUS | Status: DC | PRN

## 2021-12-24 MED ORDER — SIMETHICONE 80 MG PO CHEW
80.0000 mg | CHEWABLE_TABLET | Freq: Four times a day (QID) | ORAL | Status: DC | PRN
  Administered 2021-12-24 – 2021-12-25 (×2): 80 mg via ORAL
  Filled 2021-12-24 (×2): qty 1

## 2021-12-24 MED ORDER — HEPARIN SODIUM (PORCINE) 5000 UNIT/ML IJ SOLN
INTRAMUSCULAR | Status: AC
  Administered 2021-12-24: 5000 [IU] via SUBCUTANEOUS
  Filled 2021-12-24: qty 1

## 2021-12-24 MED ORDER — CHLORHEXIDINE GLUCONATE 0.12 % MT SOLN
OROMUCOSAL | Status: AC
  Administered 2021-12-24: 15 mL via OROMUCOSAL
  Filled 2021-12-24: qty 15

## 2021-12-24 MED ORDER — OXYCODONE HCL 5 MG PO TABS
5.0000 mg | ORAL_TABLET | Freq: Once | ORAL | Status: AC | PRN
  Administered 2021-12-24: 5 mg via ORAL

## 2021-12-24 MED ORDER — DEXAMETHASONE SODIUM PHOSPHATE 10 MG/ML IJ SOLN
INTRAMUSCULAR | Status: DC | PRN
  Administered 2021-12-24: 5 mg via INTRAVENOUS

## 2021-12-24 MED ORDER — DEXAMETHASONE SODIUM PHOSPHATE 10 MG/ML IJ SOLN
INTRAMUSCULAR | Status: AC
  Filled 2021-12-24: qty 1

## 2021-12-24 MED ORDER — HYDROMORPHONE HCL 1 MG/ML IJ SOLN
0.5000 mg | INTRAMUSCULAR | Status: AC | PRN
  Administered 2021-12-24 (×4): 0.5 mg via INTRAVENOUS

## 2021-12-24 MED ORDER — HYDROCODONE-ACETAMINOPHEN 5-325 MG PO TABS: 1.0000 | ORAL_TABLET | Freq: Four times a day (QID) | ORAL | 0 refills | Status: DC | PRN

## 2021-12-24 MED ORDER — LIDOCAINE-EPINEPHRINE 1 %-1:100000 IJ SOLN
INTRAMUSCULAR | Status: AC
  Filled 2021-12-24: qty 1

## 2021-12-24 MED ORDER — ONDANSETRON HCL 4 MG/2ML IJ SOLN
INTRAMUSCULAR | Status: DC | PRN
Start: 2021-12-24 — End: 2021-12-24
  Administered 2021-12-24: 4 mg via INTRAVENOUS

## 2021-12-24 MED ORDER — STERILE WATER FOR INJECTION IJ SOLN
INTRAMUSCULAR | Status: DC | PRN
  Administered 2021-12-24: 6 mL via INTRAMUSCULAR

## 2021-12-24 MED ORDER — FENTANYL CITRATE (PF) 100 MCG/2ML IJ SOLN
25.0000 ug | INTRAMUSCULAR | Status: DC | PRN
  Administered 2021-12-24 (×5): 25 ug via INTRAVENOUS

## 2021-12-24 MED ORDER — DEXMEDETOMIDINE (PRECEDEX) IN NS 20 MCG/5ML (4 MCG/ML) IV SYRINGE
PREFILLED_SYRINGE | INTRAVENOUS | Status: DC | PRN
  Administered 2021-12-24: 8 ug via INTRAVENOUS
  Administered 2021-12-24: 4 ug via INTRAVENOUS

## 2021-12-24 MED ORDER — EPHEDRINE SULFATE (PRESSORS) 50 MG/ML IJ SOLN
INTRAMUSCULAR | Status: DC | PRN
Start: 2021-12-24 — End: 2021-12-24
  Administered 2021-12-24 (×3): 5 mg via INTRAVENOUS

## 2021-12-24 MED ORDER — 0.9 % SODIUM CHLORIDE (POUR BTL) OPTIME
TOPICAL | Status: DC | PRN
  Administered 2021-12-24: 300 mL

## 2021-12-24 MED ORDER — FENTANYL CITRATE (PF) 100 MCG/2ML IJ SOLN
INTRAMUSCULAR | Status: AC
  Filled 2021-12-24: qty 2

## 2021-12-24 MED ORDER — TRAMADOL HCL 50 MG PO TABS
50.0000 mg | ORAL_TABLET | Freq: Four times a day (QID) | ORAL | Status: DC | PRN
  Administered 2021-12-25: 50 mg via ORAL
  Filled 2021-12-24: qty 1

## 2021-12-24 MED ORDER — BUPIVACAINE HCL (PF) 0.5 % IJ SOLN
INTRAMUSCULAR | Status: AC
  Filled 2021-12-24: qty 30

## 2021-12-24 MED ORDER — ACETAMINOPHEN 10 MG/ML IV SOLN
INTRAVENOUS | Status: AC
  Filled 2021-12-24: qty 100

## 2021-12-24 MED ORDER — OXYCODONE HCL 5 MG PO TABS
ORAL_TABLET | ORAL | Status: AC
  Filled 2021-12-24: qty 1

## 2021-12-24 MED ORDER — CHLORHEXIDINE GLUCONATE 0.12 % MT SOLN: 15.0000 mL | Freq: Once | OROMUCOSAL | Status: AC

## 2021-12-24 MED ORDER — IBUPROFEN 600 MG PO TABS: 600.0000 mg | ORAL_TABLET | Freq: Four times a day (QID) | ORAL | 0 refills | Status: DC | PRN

## 2021-12-24 MED ORDER — ONDANSETRON HCL 4 MG/2ML IJ SOLN
INTRAMUSCULAR | Status: AC
  Filled 2021-12-24: qty 2

## 2021-12-24 MED ORDER — CEFAZOLIN SODIUM-DEXTROSE 2-4 GM/100ML-% IV SOLN
2.0000 g | INTRAVENOUS | Status: AC
  Administered 2021-12-24: 2 g via INTRAVENOUS

## 2021-12-24 MED ORDER — POVIDONE-IODINE 10 % EX SWAB
2.0000 "application " | Freq: Once | CUTANEOUS | Status: AC
  Administered 2021-12-24: 2 via TOPICAL

## 2021-12-24 MED ORDER — HEPARIN SODIUM (PORCINE) 5000 UNIT/ML IJ SOLN: 5000.0000 [IU] | INTRAMUSCULAR | Status: AC

## 2021-12-24 MED ORDER — HYDROMORPHONE HCL 1 MG/ML IJ SOLN
INTRAMUSCULAR | Status: AC
  Filled 2021-12-24: qty 1

## 2021-12-24 MED ORDER — LIDOCAINE HCL (CARDIAC) PF 100 MG/5ML IV SOSY
PREFILLED_SYRINGE | INTRAVENOUS | Status: DC | PRN
Start: 2021-12-24 — End: 2021-12-24
  Administered 2021-12-24: 80 mg via INTRAVENOUS

## 2021-12-24 MED ORDER — SUGAMMADEX SODIUM 200 MG/2ML IV SOLN
INTRAVENOUS | Status: DC | PRN
Start: 2021-12-24 — End: 2021-12-24
  Administered 2021-12-24: 200 mg via INTRAVENOUS

## 2021-12-24 MED ORDER — EPHEDRINE 5 MG/ML INJ
INTRAVENOUS | Status: AC
  Filled 2021-12-24: qty 5

## 2021-12-24 MED ORDER — KETOROLAC TROMETHAMINE 30 MG/ML IJ SOLN
30.0000 mg | Freq: Once | INTRAMUSCULAR | Status: AC
  Administered 2021-12-24: 30 mg via INTRAVENOUS
  Filled 2021-12-24: qty 1

## 2021-12-24 MED ORDER — PHENAZOPYRIDINE HCL 200 MG PO TABS
200.0000 mg | ORAL_TABLET | ORAL | Status: AC
  Administered 2021-12-24: 200 mg via ORAL
  Filled 2021-12-24 (×2): qty 1

## 2021-12-24 MED ORDER — ORAL CARE MOUTH RINSE: 15.0000 mL | Freq: Once | OROMUCOSAL | Status: AC

## 2021-12-24 MED ORDER — PROPOFOL 10 MG/ML IV BOLUS
INTRAVENOUS | Status: AC
  Filled 2021-12-24: qty 20

## 2021-12-24 MED ORDER — DEXMEDETOMIDINE HCL IN NACL 200 MCG/50ML IV SOLN
INTRAVENOUS | Status: AC
  Filled 2021-12-24: qty 50

## 2021-12-24 MED ORDER — OXYCODONE-ACETAMINOPHEN 5-325 MG PO TABS
1.0000 | ORAL_TABLET | ORAL | Status: DC | PRN
  Administered 2021-12-25: 1 via ORAL
  Filled 2021-12-24: qty 1

## 2021-12-24 MED ORDER — DEXTROSE IN LACTATED RINGERS 5 % IV SOLN: INTRAVENOUS | Status: DC

## 2021-12-24 MED ORDER — OXYCODONE-ACETAMINOPHEN 5-325 MG PO TABS: 1.0000 | ORAL_TABLET | ORAL | Status: DC | PRN

## 2021-12-24 MED ORDER — OXYCODONE HCL 5 MG/5ML PO SOLN: 5.0000 mg | Freq: Once | ORAL | Status: AC | PRN

## 2021-12-24 MED ORDER — IBUPROFEN 600 MG PO TABS: 600.0000 mg | ORAL_TABLET | Freq: Four times a day (QID) | ORAL | Status: DC

## 2021-12-24 MED ORDER — DROPERIDOL 2.5 MG/ML IJ SOLN
0.6250 mg | Freq: Once | INTRAMUSCULAR | Status: DC | PRN
  Filled 2021-12-24: qty 2

## 2021-12-24 MED ORDER — CEFAZOLIN SODIUM-DEXTROSE 2-4 GM/100ML-% IV SOLN
INTRAVENOUS | Status: AC
  Filled 2021-12-24: qty 100

## 2021-12-24 MED ORDER — ACETAMINOPHEN 10 MG/ML IV SOLN
INTRAVENOUS | Status: DC | PRN
  Administered 2021-12-24: 1000 mg via INTRAVENOUS

## 2021-12-24 MED ORDER — BUPIVACAINE HCL (PF) 0.5 % IJ SOLN
INTRAMUSCULAR | Status: DC | PRN
  Administered 2021-12-24: 2 mL via PERINEURAL

## 2021-12-24 MED ORDER — PROPOFOL 10 MG/ML IV BOLUS
INTRAVENOUS | Status: DC | PRN
  Administered 2021-12-24: 100 mg via INTRAVENOUS

## 2021-12-24 MED ORDER — ONDANSETRON HCL 4 MG/2ML IJ SOLN: 4.0000 mg | Freq: Four times a day (QID) | INTRAMUSCULAR | Status: DC | PRN

## 2021-12-24 MED ORDER — LACTATED RINGERS IV SOLN: INTRAVENOUS | Status: DC

## 2021-12-24 MED ORDER — ROCURONIUM BROMIDE 100 MG/10ML IV SOLN
INTRAVENOUS | Status: DC | PRN
Start: 2021-12-24 — End: 2021-12-24
  Administered 2021-12-24: 30 mg via INTRAVENOUS
  Administered 2021-12-24: 20 mg via INTRAVENOUS
  Administered 2021-12-24: 50 mg via INTRAVENOUS

## 2021-12-24 MED ORDER — ONDANSETRON 4 MG PO TBDP
4.0000 mg | ORAL_TABLET | Freq: Four times a day (QID) | ORAL | Status: DC | PRN
Start: 2021-12-24 — End: 2021-12-25

## 2021-12-24 SURGICAL SUPPLY — 77 items
ADH SKN CLS APL DERMABOND .7 (GAUZE/BANDAGES/DRESSINGS) ×2
AGENT HMST KT MTR STRL THRMB (HEMOSTASIS)
APL ESCP 34 STRL LF DISP (HEMOSTASIS)
APPLICATOR SURGIFLO ENDO (HEMOSTASIS) IMPLANT
BAG DRN RND TRDRP ANRFLXCHMBR (UROLOGICAL SUPPLIES) ×2
BAG URINE DRAIN 2000ML AR STRL (UROLOGICAL SUPPLIES) ×3 IMPLANT
BLADE SURG 15 STRL LF DISP TIS (BLADE) ×2 IMPLANT
BLADE SURG 15 STRL SS (BLADE) ×3
CANNULA DILATOR 5 W/SLV (CANNULA) ×2 IMPLANT
CATH FOLEY 2WAY  5CC 16FR (CATHETERS) ×3
CATH FOLEY 2WAY 5CC 16FR (CATHETERS) ×2
CATH URTH 16FR FL 2W BLN LF (CATHETERS) ×2 IMPLANT
CNTNR SPEC 2.5X3XGRAD LEK (MISCELLANEOUS) ×2
CONT SPEC 4OZ STER OR WHT (MISCELLANEOUS) ×1
CONT SPEC 4OZ STRL OR WHT (MISCELLANEOUS) ×2
CONTAINER SPEC 2.5X3XGRAD LEK (MISCELLANEOUS) ×2 IMPLANT
CORD MONOPOLAR M/FML 12FT (MISCELLANEOUS) ×3 IMPLANT
DERMABOND ADVANCED (GAUZE/BANDAGES/DRESSINGS) ×1
DERMABOND ADVANCED .7 DNX12 (GAUZE/BANDAGES/DRESSINGS) ×2 IMPLANT
DEVICE SUTURE ENDOST 10MM (ENDOMECHANICALS) ×1 IMPLANT
DRAPE STERI POUCH LG 24X46 STR (DRAPES) ×6 IMPLANT
DRSG TELFA 3X8 NADH (GAUZE/BANDAGES/DRESSINGS) ×3 IMPLANT
GAUZE 4X4 16PLY ~~LOC~~+RFID DBL (SPONGE) ×7 IMPLANT
GLOVE SURG ENC MOIS LTX SZ8 (GLOVE) ×15 IMPLANT
GLOVE SURG UNDER LTX SZ8 (GLOVE) ×9 IMPLANT
GOWN STRL REUS W/ TWL LRG LVL3 (GOWN DISPOSABLE) ×2 IMPLANT
GOWN STRL REUS W/ TWL XL LVL3 (GOWN DISPOSABLE) ×6 IMPLANT
GOWN STRL REUS W/TWL LRG LVL3 (GOWN DISPOSABLE) ×9
GOWN STRL REUS W/TWL XL LVL3 (GOWN DISPOSABLE) ×9
GRASPER SUT TROCAR 14GX15 (MISCELLANEOUS) ×3 IMPLANT
IRRIGATION STRYKERFLOW (MISCELLANEOUS) IMPLANT
IRRIGATOR STRYKERFLOW (MISCELLANEOUS)
IV LACTATED RINGERS 1000ML (IV SOLUTION) IMPLANT
KIT IMAGING PINPOINTPAQ (MISCELLANEOUS) ×1 IMPLANT
KIT PINK PAD W/HEAD ARE REST (MISCELLANEOUS) ×3
KIT PINK PAD W/HEAD ARM REST (MISCELLANEOUS) ×2 IMPLANT
LABEL OR SOLS (LABEL) ×3 IMPLANT
LIGASURE VESSEL 5MM BLUNT TIP (ELECTROSURGICAL) ×1 IMPLANT
MANIFOLD NEPTUNE II (INSTRUMENTS) ×3 IMPLANT
MANIPULATOR VCARE LG CRV RETR (MISCELLANEOUS) IMPLANT
MANIPULATOR VCARE SML CRV RETR (MISCELLANEOUS) IMPLANT
MANIPULATOR VCARE STD CRV RETR (MISCELLANEOUS) ×1 IMPLANT
NDL INSUFF ACCESS 14 VERSASTEP (NEEDLE) ×3 IMPLANT
NDL SPNL 22GX5 LNG QUINC BK (NEEDLE) IMPLANT
NEEDLE SPNL 22GX5 LNG QUINC BK (NEEDLE) ×3 IMPLANT
NS IRRIG 500ML POUR BTL (IV SOLUTION) ×3 IMPLANT
OCCLUDER COLPOPNEUMO (BALLOONS) ×3 IMPLANT
PACK BASIN MINOR ARMC (MISCELLANEOUS) ×1 IMPLANT
PACK GYN LAPAROSCOPIC (MISCELLANEOUS) ×3 IMPLANT
PAD DRESSING TELFA 3X8 NADH (GAUZE/BANDAGES/DRESSINGS) IMPLANT
PAD OB MATERNITY 4.3X12.25 (PERSONAL CARE ITEMS) ×3 IMPLANT
PAD PREP 24X41 OB/GYN DISP (PERSONAL CARE ITEMS) ×3 IMPLANT
POUCH ENDO CATCH II 15MM (MISCELLANEOUS) IMPLANT
SET CYSTO W/LG BORE CLAMP LF (SET/KITS/TRAYS/PACK) IMPLANT
SET TUBE SMOKE EVAC HIGH FLOW (TUBING) ×3 IMPLANT
SHEARS ENDO 5MM 31CM (CUTTER) ×1 IMPLANT
SPONGE T-LAP 4X18 ~~LOC~~+RFID (SPONGE) IMPLANT
SURGIFLO W/THROMBIN 8M KIT (HEMOSTASIS) IMPLANT
SUT DVC VLOC 180 0 12IN GS21 (SUTURE) ×6
SUT ENDO VLOC 180-0-8IN (SUTURE) ×1 IMPLANT
SUT MNCRL 4-0 (SUTURE) ×12
SUT MNCRL 4-0 27XMFL (SUTURE) ×8
SUT VIC AB 0 CT1 36 (SUTURE) ×4 IMPLANT
SUT VIC AB 0 CT2 27 (SUTURE) ×2 IMPLANT
SUT VIC AB 2-0 SH 27 (SUTURE) ×6
SUT VIC AB 2-0 SH 27XBRD (SUTURE) IMPLANT
SUT VICRYL 0 AB UR-6 (SUTURE) ×3 IMPLANT
SUTURE DVC VLC 180 0 12IN GS21 (SUTURE) IMPLANT
SUTURE MNCRL 4-0 27XMF (SUTURE) ×4 IMPLANT
SYR 10ML LL (SYRINGE) ×3 IMPLANT
SYR 3ML LL SCALE MARK (SYRINGE) IMPLANT
SYR 50ML LL SCALE MARK (SYRINGE) ×3 IMPLANT
SYR CONTROL 10ML LL (SYRINGE) ×1 IMPLANT
TROCAR BLUNT TIP 12MM OMST12BT (TROCAR) ×4 IMPLANT
TROCAR VERSASTEP PLUS 12MM (TROCAR) ×4 IMPLANT
TROCAR VERSASTEP PLUS 5MM (TROCAR) ×5 IMPLANT
WATER STERILE IRR 500ML POUR (IV SOLUTION) ×3 IMPLANT

## 2021-12-24 NOTE — Op Note (Addendum)
Operative Note ? ?Preoperative Diagnosis: uterovaginal prolapse, incomplete ? ?Postoperative Diagnosis: same ? ?Procedures performed:  ?Laparoscopic uterosacral suspension, cystoscopy, episiotomy and perineal repair.  ? ?Implants: none ? ?Attending Surgeon: Sherlene Shams, MD ? ?Co-surgeons: Dr Jeannie Fend and Dr Mellody Drown ? ? ?Anesthesia: General endotracheal ? ?Findings: 1. On vaginal exam, pessary in place which required episiotomy to remove ? 2. On cystoscopy, normal bladder and urethra without injury, lesion or foreign body, bilateral ureteral efflux noted.  ? ?Specimens: Oncology specimens ?ID Type Source Tests Collected by Time Destination  ?1 : left external iliac sentinel lymph node Tissue PATH Gyn biopsy SURGICAL PATHOLOGY Mellody Drown, MD 12/24/2021 1359   ?2 : right external iliac sentinel lymph node Tissue PATH Gyn biopsy SURGICAL PATHOLOGY Mellody Drown, MD 12/24/2021 1402   ?3 : uterus with cervix, bilateral tubes and ovaries Tissue PATH Gyn biopsy SURGICAL PATHOLOGY Mellody Drown, MD 12/24/2021 1458   ?A : pelvic washings Body Fluid PATH Cytology Pelvic Washing CYTOLOGY - NON PAP Mellody Drown, MD 12/24/2021 1357   ? ? ?Estimated blood loss: 10 mL for this portion of the procedure ? ?IV fluids: 1000 mL ? ?Urine output: see flowsheet  ? ?Complications: episitomy was needed to remove intravaginal pessary ? ?Procedure in Detail: ?After informed consent was obtained the patient was taken to the operating where general anesthesia was induced and found to be adequate.  Placed in dorsolithotomy position and prepped and draped in the usual sterile fashion.  Vaginal exam was performed and the pessary was unable to be removed.  The decision was made to proceed with an episiotomy in order to safely remove the pessary.  The perineal body was injected with 1% lidocaine with epinephrine.  A 15 blade scalpel was used to incise the perineal body in the midline.  The pessary was then removed intact.   No further injury was noted to the vaginal tissues.  Dr Fransisca Connors and Dr. Amalia Hailey then proceeded with their portion of the surgery. ? ?Once the vaginal cuff was closed after hysterectomy, a 0 V-Loc suture was introduced into the abdominal cavity.  The ureters were identified bilaterally and noted to be away from the uterosacral ligaments.  Using a needle driver, the right uterosacral ligament was grasped and the suture was plicated through the uterosacral ligament two times then brought through the vaginal cuff from the top to bottom and back through the loop of the V-Loc in the uterosacral ligament.  The suture was tightened to suspend the right side of the vagina.  This was repeated on the left side to suspend the left side of the vagina. ? ?The Foley catheter was removed.  Cystoscopy was then performed with a 70 degree cystoscope with sterile water.  360 degree view of the bladder was obtained. There was no injury lesion or foreign bodies present.  Efflux was noted from both ureters bilaterally with the assistance of Pyridium.  The cystoscope was then removed and the Foley catheter was replaced.  ? ?Attention was then turned to the perineum.  Vicryl suture was used to close the subcutaneous tissue in an interrupted fashion.  The vaginal mucosa was then run with a 2-0 Vicryl and the perineal body was closed in a subcutaneous and subcuticular fashion.  Hemostasis was noted.  The patient tolerated the procedure well and was taken the recovery room in stable condition needle and sponge counts were correct x2. ? ?Jaquita Folds, MD ? ?

## 2021-12-24 NOTE — Interval H&P Note (Signed)
History and Physical Interval Note: ? ?12/24/2021 ?11:21 AM ? ?SHAUNEE MULKERN  has presented today for surgery, with the diagnosis of Endometrial cancer, Uterovaginal Prolapse, Anterior Prolapse, Posterior Prolapse.  The various methods of treatment have been discussed with the patient and family. After consideration of risks, benefits and other options for treatment, the patient has consented to  Procedure(s): ?TOTAL LAPAROSCOPIC HYSTERECTOMY WITH BILATERAL SALPINGO OOPHORECTOMY, PELVIC NODE BIOPSY, POSSIBLE PELVIC OR PARA-AORTIC NODE DISSECTION, POSSIBLE LAPAROTOMY, POSSIBLE ROBOTIC APPROACH, POSSIBLE PESSARY REMOVAL (Bilateral) ?SENTINEL LYMPH NODE INJECTION AND MAPPING (N/A) ?INDOCYANINE GREEN FLUORESCENCE IMAGING (ICG) (N/A) ?LAPAROSCOPIC UTEROSCACRAL LIGAMENT SUSPENSION (N/A) ?POSSIBLE ANTERIOR (CYSTOCELE) AND POSTERIOR REPAIR (RECTOCELE) (N/A) ?CYSTOSCOPY (N/A) as a surgical intervention.  The patient's history has been reviewed, patient examined, no change in status, stable for surgery.  I have reviewed the patient's chart and labs.  Questions were answered to the patient and family's satisfaction.   ? ?Mellody Drown ? ? ?

## 2021-12-24 NOTE — Anesthesia Procedure Notes (Signed)
Procedure Name: Intubation ?Date/Time: 12/24/2021 11:48 AM ?Performed by: [name], CRNA ?Pre-anesthesia Checklist: Patient identified, Emergency Drugs available, Suction available and Patient being monitored ?Patient Re-evaluated:Patient Re-evaluated prior to induction ?Oxygen Delivery Method: Circle system utilized ?Preoxygenation: Pre-oxygenation with 100% oxygen ?Induction Type: IV induction ?Ventilation: Mask ventilation without difficulty ?Laryngoscope Size: McGraph and 3 ?Grade View: Grade I ?Tube type: Oral ?Tube size: 6.5 mm ?Number of attempts: 1 ?Airway Equipment and Method: Stylet, Oral airway and Video-laryngoscopy ?Placement Confirmation: ETT inserted through vocal cords under direct vision, positive ETCO2 and breath sounds checked- equal and bilateral ?Secured at: 22 cm ?Tube secured with: Tape ?Dental Injury: Teeth and Oropharynx as per pre-operative assessment  ? ? ? ? ?

## 2021-12-24 NOTE — Progress Notes (Signed)
Informed pt's son and daughter pt will be admitted to room 351.   ?

## 2021-12-24 NOTE — Discharge Instructions (Signed)
UROGYNECOLOGY (Dr Wannetta Sender) [place] ? ?General Instructions ?Walking is encouraged, but refrain from strenuous exercise/ housework/ heavy lifting. ?No lifting >10lbs  ?Nothing in the vagina- NO intercourse, tampons or douching ?Bathing:  Do not submerge in water (NO swimming, bath, hot tub, etc) until after your postop visit. You can shower starting the day after surgery.  ?You may place an ice pack on the vaginal area for 20 min at a time if needed for comfort.  ?Take medications as directed.  ? ?Reasons to Call the Nurse (see last page for phone numbers) ?Heavy Bleeding (changing your pad every 1-2 hours) ?Persistent nausea/vomiting ?Fever (100.4 degrees or more) ?Incision problems (pus or other fluid coming out, redness, warmth, increased pain) ? ?Things to Expect After Surgery ?Mild to Moderate pain is normal during the first day or two after surgery. If prescribed, take Ibuprofen or Tylenol first and use the stronger medicine for ?break-through? pain. You can overlap these medicines because they work differently.  ? ?Constipation  ? ?To Prevent Constipation:  Eat a well-balanced diet including protein, grains, fresh fruit and vegetables.  Drink plenty of fluids. Walk regularly.  Depending on specific instructions from your physician: take Miralax daily and additionally you can add a stool softener (colace/ docusate) and fiber supplement. Continue as long as you're on pain medications.  ? ?To Treat Constipation:  If you do not have a bowel movement in 2 days after surgery, you can take 2 Tbs of Milk of Magnesia 1-2 times a day until you have a bowel movement. If diarrhea occurs, decrease the amount or stop the laxative. If no results with Milk of Magnesia, you can drink a bottle of magnesium citrate which you can purchase over the counter. ? ?Catheter ?If you go home with a catheter, the office will call to set up a voiding trial a few days after surgery. For most patients, by this visit,  they are able to urinate on their own. Long term catheter use is rare.  ? ?Post op concerns ? ?For non-emergent issues, please call the Urogynecology Nurse. Please leave a message and someone will contact you within one business day.  You can also send a message through [place].  ? ?AFTER HOURS (After 5:00 PM and on weekends):  For urgent matters that cannot wait until the next business day. Call our office [phone] and connect to the doctor on call.  ?Please reserve this for important issues. ? ?**FOR ANY TRUE EMERGENCY ISSUES CALL 911 OR GO TO THE NEAREST EMERGENCY ROOM.** Please inform our office or the doctor on call of any emergency.   ? ?APPOINTMENTS: Call [phone] ? ?

## 2021-12-24 NOTE — Anesthesia Preprocedure Evaluation (Addendum)
Anesthesia Evaluation  ?Patient identified by MRN, date of birth, ID band ?Patient awake ? ? ? ?Reviewed: ?Allergy & Precautions, NPO status , Patient's Chart, lab work & pertinent test results ? ?History of Anesthesia Complications ?Negative for: history of anesthetic complications ? ?Airway ?Mallampati: III ? ? ?Neck ROM: Full ? ? ? Dental ?no notable dental hx. ? ?  ?Pulmonary ?neg pulmonary ROS,  ?  ?Pulmonary exam normal ?breath sounds clear to auscultation ? ? ? ? ? ? Cardiovascular ?Exercise Tolerance: Good ?Normal cardiovascular exam ?Rhythm:Regular Rate:Normal ? ?ECG 11/28/21:  ?Sinus rhythm with Premature supraventricular complexes ?Nonspecific ST and T wave abnormality ? ?ECHO 2/23: ??1. Left ventricular ejection fraction, by estimation, is 55 to 60%. The  ?left ventricle has normal function. The left ventricle has no regional  ?wall motion abnormalities. There is mild left ventricular hypertrophy.  ?Left ventricular diastolic parameters  ?are consistent with Grade I diastolic dysfunction (impaired relaxation).  ??2. Right ventricular systolic function is normal. The right ventricular  ?size is normal. Tricuspid regurgitation signal is inadequate for assessing  ?PA pressure.  ??3. The mitral valve is normal in structure. Mild mitral valve  ?regurgitation. No evidence of mitral stenosis.  ??4. The aortic valve is normal in structure. Aortic valve regurgitation is  ?not visualized. No aortic stenosis is present.  ??5. The inferior vena cava is dilated in size with >50% respiratory  ?variability, suggesting right atrial pressure of 8 mmHg.  ?  ?Neuro/Psych ? Headaches, HOH ?  ? GI/Hepatic ?hiatal hernia (1 cm hiatal hernia), GERD  Controlled and Medicated,  ?Endo/Other  ?negative endocrine ROS ? Renal/GU ?Renal InsufficiencyRenal disease  ? ?  ?Musculoskeletal ?Raynaud's  ? Abdominal ?Normal abdominal exam  (+)   ?Peds ? Hematology ? ?(+) Blood dyscrasia (IDA, recieving B12),  anemia , Breast and skin CA   ?Anesthesia Other Findings ?Endometrial cancer, Uterovaginal Prolapse, Anterior Prolapse, Posterior Prolapse ? ?Hyponatremia ? Reproductive/Obstetrics ? ?  ? ? ? ? ? ? ? ? ? ? ? ? ? ?  ?  ? ? ? ? ? ? ?Anesthesia Physical ? ?Anesthesia Plan ? ?ASA: 3 ? ?Anesthesia Plan: General  ? ?Post-op Pain Management: Toradol IV (intra-op)* and Regional block*  ? ?Induction: Intravenous ? ?PONV Risk Score and Plan: 3 and Ondansetron, Propofol infusion and Dexamethasone ? ?Airway Management Planned: Oral ETT ? ?Additional Equipment:  ? ?Intra-op Plan:  ? ?Post-operative Plan: Extubation in OR ? ?Informed Consent: I have reviewed the patients History and Physical, chart, labs and discussed the procedure including the risks, benefits and alternatives for the proposed anesthesia with the patient or authorized representative who has indicated his/her understanding and acceptance.  ? ? ? ?Dental advisory given ? ?Plan Discussed with: CRNA ? ?Anesthesia Plan Comments:   ? ? ? ? ? ?Anesthesia Quick Evaluation ? ?

## 2021-12-24 NOTE — Progress Notes (Signed)
Updated son and daughter pt is resting in PACU and we are working on reducing pain.   ?

## 2021-12-24 NOTE — Op Note (Signed)
?Operative Note  ? ?12/24/2021 ?3:40 PM ? ?PRE-OP DIAGNOSIS: Endometrial carcinosarcoma, Uterovaginal Prolapse, Anterior Prolapse, Posterior Prolapse  ?  ?POST-OP DIAGNOSIS: same  ? ?SURGEON: Surgeon(s) and Role: ?Panel 1: ?   Elizabeth Drown, MD - Primary ?   Harlin Heys, MD - Assisting ?Panel 2: ?   Wannetta Sender, Governor Rooks, MD - Primary ? ?ANESTHESIA: General  ? ?PROCEDURE: TOTAL LAPAROSCOPIC HYSTERECTOMY WITH BILATERAL SALPINGO OOPHORECTOMY, BILATERAL PELVIC SENTINEL LYMPH NODE INJECTION AND MAPPING AND BIOPSIES ? ? ?ESTIMATED BLOOD LOSS: Minimal ? ?DRAINS: NONE  ? ?TOTAL IV FLUIDS: D5 LR per Anesth ? ?SPECIMENS: uterus, tubes, ovaries, bilateral pelvic SLNs, washings ? ?COMPLICATIONS: none ? ?DISPOSITION: PACU - hemodynamically stable. ? ?CONDITION: stable ? ?INDICATIONS: uterine carcinosarcoma ? ?FINDINGS: old blood staining of the peritoneum, normal uterus, tubes and ovaries.  SLNs mapped to the external iliac areas bilaterally.   ? ?PROCEDURE IN DETAIL: After informed consent was obtained, the patient was taken to the operating room where anesthesia was obtained without difficulty. The patient was positioned in the dorsal lithotomy position in [place] and her arms were carefully tucked at her sides and the usual precautions were taken.  She was prepped and draped in normal sterile fashion.  Dr Wannetta Sender did  a midline episiotomy to remove the old pessary.  Time-out was performed and a Foley catheter was placed into the bladder and the cervix was infiltrated with 4 ml of ICG fluorescent dye at 3 an 9 o'clock both superficial and deep injections. A standard VCare uterine manipulator was then placed in the uterus without incident.   ? ?An open Hasson technique was used to place an infraumbilical 81-XB baloon trocar under direct visualization. The laparoscope was introduced and CO2 gas was infused for pneumoperitoneum to a pressure of 15 mm Hg.  Right and left lateral 5-mm ports and a 5-12 mm  suprapubic port were placed under direct visualization of the laparoscope using an EndoStep technique.  Cytologic washings were obtained.  The patient was placed in Trendelenburg and the bowel was displaced up into the upper abdomen.  Round ligaments were divided on each side with the EndoShears and the retroperitoneal space was opened bilaterally.  The ureters were identified and preserved.  At this point the retroperitoneal spaces were developed and the lymphatic channels were mapped to each side.  The sentinel node on the right side was then identified, skeletonized and removed taking care not to injure the  obturator nerve, the ureter or the pelvic vasculature.  Similarly on the left side, the retroperitoneal spaces were developed, the lymphatic channels mapped to identify the sentinel node(s) and they were similarly removed with care to preserve the obturator nerve, the ureter and the pelvic vasculature. With hemostasis secured, the infundibulopelvic ligaments were skeletonized, sealed and divided with the LigaSure device.  A bladder flap was created and the bladder was dissected down off the lower uterine segment and cervix using endoshears and electrocautery.  The uterine arteries were skeletonized bilaterally, sealed and divided with the LigaSure device.  A colpotomy was performed circumferentially along the V-Care ring with electrocautery and the cervix was incised from the vagina and the specimen was removed through the vagina.  A pneumo balloon was placed in the vagina and the vaginal cuff was then closed in a running continuous fashion using the EndoStitch technique with 0 V-Lock suture with careful attention to include the vaginal cuff angles and the vaginal mucosa within the closure.   ? ?Dr Wannetta Sender then  did a laparoscopic uterosacral plication and closed the episiotomy.  See her note.  ? ?Hemostasis was observed. The intraperitoneal pressure was dropped, and all planes of dissection, vascular  pedicles and the vaginal cuff were found to be hemostatic.  The suprapubic trocar was removed and the fascia was closed with 0 Vicryl suture using the Endoclose technique. The lateral trocars were removed under visualization and also closed with an interrupted suture.   Before the umbilical trocar was removed the CO2 gas was released.  The fascia there was closed with 0 Vicryl suture in interrupted figure of eight technique.   The skin incisions were closed with subcuticular stitches and glue. The patient tolerated the procedure well.  Sponge, lap and needle counts were correct x2.  The patient was taken to recovery room in excellent condition. ? ?Antibiotics: {Blank single:19197::"Given 1st or 2nd generation cephalosporin, Antibiotics given within 1 hour of the start of the procedure, Antibiotics ordered to be discontinued within 24 hours post procedure" ? ? ?VTE prophylaxis: was ordered perioperatively with SQ heparin and IPCs. ? ?Elizabeth Drown, MD  ?

## 2021-12-24 NOTE — Transfer of Care (Signed)
Immediate Anesthesia Transfer of Care Note ? ?Patient: Elizabeth Friedman ? ?Procedure(s) Performed: TOTAL LAPAROSCOPIC HYSTERECTOMY WITH BILATERAL SALPINGO OOPHORECTOMY, PELVIC NODE BIOPSY, POSSIBLE PELVIC  NODE DISSECTION,  PESSARY REMOVAL (Bilateral) ?SENTINEL LYMPH NODE INJECTION AND MAPPING ?INDOCYANINE GREEN FLUORESCENCE IMAGING (ICG) ?Laparoscopic uterosacral suspension, episiotomy and perineal repair. ?CYSTOSCOPY ? ?Patient Location: PACU ? ?Anesthesia Type:General ? ?Level of Consciousness: unresponsive and drowsy ? ?Airway & Oxygen Therapy: Patient Spontanous Breathing and Patient connected to face mask oxygen ? ?Post-op Assessment: Report given to RN and Post -op Vital signs reviewed and stable ? ?Post vital signs: Reviewed and stable ? ?Last Vitals:  ?Vitals Value Taken Time  ?BP 130/63 12/24/21 1547  ?Temp    ?Pulse 67 12/24/21 1559  ?Resp 9 12/24/21 1559  ?SpO2 100 % 12/24/21 1559  ?Vitals shown include unvalidated device data. ? ?Last Pain:  ?Vitals:  ? 12/24/21 1028  ?TempSrc: Oral  ?PainSc: 0-No pain  ?   ? ?  ? ?Complications: No notable events documented. ?

## 2021-12-25 ENCOUNTER — Encounter: Payer: Self-pay | Admitting: Obstetrics and Gynecology

## 2021-12-25 ENCOUNTER — Other Ambulatory Visit: Payer: Self-pay

## 2021-12-25 ENCOUNTER — Telehealth: Payer: Self-pay | Admitting: Obstetrics and Gynecology

## 2021-12-25 DIAGNOSIS — C541 Malignant neoplasm of endometrium: Secondary | ICD-10-CM | POA: Diagnosis not present

## 2021-12-25 MED ORDER — TRAMADOL HCL 50 MG PO TABS: 50.0000 mg | ORAL_TABLET | Freq: Four times a day (QID) | ORAL | 0 refills | Status: DC | PRN

## 2021-12-25 MED ORDER — OXYCODONE-ACETAMINOPHEN 5-325 MG PO TABS
1.0000 | ORAL_TABLET | ORAL | 0 refills | Status: DC | PRN
Start: 2021-12-25 — End: 2022-01-06

## 2021-12-25 NOTE — Telephone Encounter (Signed)
Elizabeth Friedman underwent laparoscopic uterosacral suspension and perineal reapair on 12/24/21. Also underwent laparoscopic hysterectomy with the oncology team ? ?She passed her voiding trial.  ?336ml was backfilled into the bladder ?Voided 229ml  ?PVR by bladder scan was 164ml.  ? ?She will be discharged without a catheter. Please call her for a routine post op check. ? ?Jaquita Folds, MD ? ?

## 2021-12-25 NOTE — Progress Notes (Signed)
Patient educated on discharge instructions, medications, and follow up appointments. Patient verbalized understanding. Patient escorted out by staff.  ?

## 2021-12-25 NOTE — Discharge Summary (Signed)
? ? ?Discharge Summary ? ?Admit date: 12/24/2021 ? ?Discharge Date and Time:12/25/2021  8:25 AM ? ?Discharge to:  Home ? ?Admission Diagnosis:  ?Endometrial Cancer ( [facility]) ?Post -op                   ? ?Discharge  Diagnoses: Principal Problem: ?  Postoperative state ?Active Problems: ?  Endometrial cancer ([place]) ? ? ?OR Procedures:   ?Procedure(s): ?TOTAL LAPAROSCOPIC HYSTERECTOMY WITH BILATERAL SALPINGO OOPHORECTOMY, PELVIC NODE BIOPSY,  PESSARY REMOVAL (PESSARY REMOVAL BY DR. Wannetta Sender ?SENTINEL LYMPH NODE INJECTION AND MAPPING ?INDOCYANINE GREEN FLUORESCENCE IMAGING (ICG) ?Laparoscopic uterosacral suspension, episiotomy and perineal repair. ?CYSTOSCOPY ?Date ?------------------- ?  ?                           Discharge Day Progress Note: ? ? Subjective: ?  The patient does not have complaints.  She is ambulating well. She is taking PO well. Her pain is well controlled with her current medications. She is urinating without difficulty. ? ? Objective: ? BP (!) 153/69 (BP Location: Right Arm)   Pulse 73   Temp 98 ?F (36.7 ?C) (Oral)   Resp 16   Ht 5\' 1"  (1.549 m)   Wt 46.3 kg   SpO2 100%   BMI 19.27 kg/m?   ?  Abdomen:  ?                       Soft NT ?Wounds clean, dry, no drainage ?  ? Assessment: ?  Doing well.  Normal progress as expected. ?  Pain now well controlled ? Plan: ?       Discharge home. ?                      Medications as directed. ? ?Hospital Course: She was admitted for observation postop for pain relief which was initially not well controlled in recovery room.  In addition, the patient's family was concerned and felt that they could not adequately help her last night. ? After going to [place], patient's pain was better controlled.  She rested through the night.  This morning she has been ambulating, voiding and taking p.o. without difficulty. ? ? ?Condition at Discharge:  good ?Discharge Medications:  ?Allergies as of 12/25/2021   ?No Known Allergies ?  ? ?  ?Medication List  ?  ? ?TAKE these  medications   ? ?acetaminophen 500 MG tablet ?Commonly known as: TYLENOL ?Take 1,000 mg by mouth every 6 (six) hours as needed for moderate pain or mild pain. ?  ?alum & mag hydroxide-simeth 200-200-20 MG/5ML suspension ?Commonly known as: MAALOX/MYLANTA ?Take 30 mLs by mouth every 6 (six) hours as needed for indigestion or heartburn. ?  ?CALTRATE 600 PLUS-VIT D PO ?Take 1 tablet by mouth in the morning. ?  ?Carboxymethylcellulose Sod PF 1 % Gel ?Place 1 drop into both eyes at bedtime. ?  ?celecoxib 200 MG capsule ?Commonly known as: CELEBREX ?Take 200 mg by mouth 2 (two) times daily. ?  ?Coenzyme Q10 200 MG Tabs ?Take 200 mg by mouth in the morning. ?  ?FISH OIL PO ?Take 1,400 mg by mouth in the morning. ?  ?gabapentin 100 MG capsule ?Commonly known as: NEURONTIN ?Take 100 mg by mouth 4 (four) times daily. ?  ?NASACORT ALLERGY 24HR NA ?Place 1 spray into the nose daily as needed (sinus congestion). ?  ?omeprazole 40 MG capsule ?Commonly known  as: PRILOSEC ?Take 40 mg by mouth daily in the afternoon. ?  ?oxyCODONE-acetaminophen 5-325 MG tablet ?Commonly known as: PERCOCET/ROXICET ?Take 1-2 tablets by mouth every 4 (four) hours as needed for moderate pain. ?  ?pantoprazole 40 MG tablet ?Commonly known as: PROTONIX ?Take 40 mg by mouth in the morning. ?  ?Restasis 0.05 % ophthalmic emulsion ?Generic drug: cycloSPORINE ?Place 1 drop into both eyes 2 (two) times daily. ?  ?simvastatin 10 MG tablet ?Commonly known as: ZOCOR ?Take 10 mg by mouth every evening. ?  ?topiramate 50 MG tablet ?Commonly known as: TOPAMAX ?Take 50 mg by mouth every evening. ?  ?traMADol 50 MG tablet ?Commonly known as: ULTRAM ?Take 1 tablet (50 mg total) by mouth every 6 (six) hours as needed (mild pain). ?What changed:  ?how much to take ?reasons to take this ?  ?VITAMIN B-12 PO ?Take 1 tablet by mouth in the morning. ?  ?Vitamin D 125 MCG (5000 UT) Caps ?Take 5,000 Units by mouth in the morning. ?  ?vitamin E 180 MG (400 UNITS) capsule ?Take  400 Units by mouth in the morning. ?  ?Vitron-C 65-125 MG Tabs ?Generic drug: Iron-Vitamin C ?Take 1 tablet by mouth in the morning. ?  ? ?  ? ?  ?  ? ? ?  ?Discharge Care Instructions  ?(From admission, onward)  ?  ? ? ?  ? ?  Start     Ordered  ? 12/25/21 0000  No dressing needed       ?Comments: Keep wound area clean and dry as directed  ? 12/25/21 0820  ? 12/24/21 0000  No dressing needed       ?Comments: Keep wound area clean and dry as directed  ? 12/24/21 1610  ? ?  ?  ? ?  ? ? ? ?Follow Up:   ? Follow-up Information   ? ? Harlin Heys, MD Follow up in 1 week(s).   ?Specialties: Obstetrics and Gynecology, Radiology ?Contact information: ?[address] ?[address] ?[place] [address] ?[phone] ? ? ?  ?  ? ?  ?  ? ?  ? ? ?Finis Bud, M.D. ?12/25/2021 ?8:25 AM ? ? ? ?

## 2021-12-26 LAB — CYTOLOGY - NON PAP

## 2021-12-26 NOTE — Anesthesia Postprocedure Evaluation (Signed)
Anesthesia Post Note ? ?Patient: Elizabeth Friedman ? ?Procedure(s) Performed: TOTAL LAPAROSCOPIC HYSTERECTOMY WITH BILATERAL SALPINGO OOPHORECTOMY, PELVIC NODE BIOPSY,  PESSARY REMOVAL (PESSARY REMOVAL BY DR. Wannetta Sender (Bilateral) ?SENTINEL LYMPH NODE INJECTION AND MAPPING ?INDOCYANINE GREEN FLUORESCENCE IMAGING (ICG) ?Laparoscopic uterosacral suspension, episiotomy and perineal repair. ?CYSTOSCOPY ? ?Patient location during evaluation: PACU ?Anesthesia Type: General ?Level of consciousness: awake and alert ?Pain management: pain level controlled ?Vital Signs Assessment: post-procedure vital signs reviewed and stable ?Respiratory status: spontaneous breathing, nonlabored ventilation and respiratory function stable ?Cardiovascular status: blood pressure returned to baseline and stable ?Postop Assessment: no apparent nausea or vomiting ?Anesthetic complications: no ? ? ?No notable events documented. ? ? ?Last Vitals:  ?Vitals:  ? 12/25/21 0319 12/25/21 0826  ?BP: (!) 153/69 (!) 153/73  ?Pulse: 73 72  ?Resp:  20  ?Temp: 36.7 ?C 36.6 ?C  ?SpO2: 100%   ?  ?Last Pain:  ?Vitals:  ? 12/25/21 0832  ?TempSrc:   ?PainSc: 1   ? ? ?  ?  ?  ?  ?  ?  ? ?Elizabeth Friedman ? ? ? ? ?

## 2021-12-29 ENCOUNTER — Encounter: Payer: Self-pay | Admitting: *Deleted

## 2021-12-30 DIAGNOSIS — C541 Malignant neoplasm of endometrium: Secondary | ICD-10-CM | POA: Diagnosis not present

## 2021-12-31 ENCOUNTER — Other Ambulatory Visit: Payer: Self-pay

## 2021-12-31 ENCOUNTER — Encounter: Payer: Self-pay | Admitting: Nurse Practitioner

## 2021-12-31 ENCOUNTER — Other Ambulatory Visit: Payer: PPO

## 2021-12-31 ENCOUNTER — Encounter: Payer: PPO | Admitting: Obstetrics and Gynecology

## 2021-12-31 DIAGNOSIS — C541 Malignant neoplasm of endometrium: Secondary | ICD-10-CM

## 2021-12-31 DIAGNOSIS — C801 Malignant (primary) neoplasm, unspecified: Secondary | ICD-10-CM

## 2021-12-31 NOTE — Telephone Encounter (Signed)
Post- Op Call ? ?Elizabeth Friedman underwent laparoscopic uterosacral suspension and perineal repair on 12/24/21 with Dr Wannetta Sender. The patient reports that her pain is controlled. She is taking tylenol and has taken a couple tramadol. She denies vaginal bleeding. She has had a bowel movement and is taking stool softener and for a bowel regimen. She was discharged without a catheter. Advised to call with any concerns ? ?[name], RN  ?

## 2021-12-31 NOTE — Progress Notes (Unsigned)
Biopsy- carcinosarcoma or mixed mullerian tumor  Underwent TLH-BSO. Demonstrated carcinosarcoma arising in the endometrium.pn0- figo 1a.  Imaging was negative.  Recommended plan: offer chemotherapy and vaginal brachytherapy  Positive washings concerning with this type of tumor. Recommend 6 cycles.   Will have her discuss recommendations with Dr. Theora Gianotti today to expedite referral to rad-onc and med-onc.

## 2022-01-02 ENCOUNTER — Telehealth: Payer: Self-pay | Admitting: Obstetrics and Gynecology

## 2022-01-02 NOTE — Telephone Encounter (Signed)
Pt called asking if she can drive- she is a post op pt " states that she is doing well"- her next apt is 3-14 - she is not wanting to depend on her family for transportation and is wanting to know if she is clear to drive to her apt. Pleas advise. ?

## 2022-01-02 NOTE — Telephone Encounter (Signed)
Spoke with patient. She states she is feeling well and not having any issues. She states no narcotic use, only tylenol as needed. Per Dr. Amalia Hailey recommendation patient is allowed to drive as long as she is not taking any narcotics. Patient advised and voiced understanding.  ?

## 2022-01-05 LAB — SURGICAL PATHOLOGY

## 2022-01-06 ENCOUNTER — Ambulatory Visit (INDEPENDENT_AMBULATORY_CARE_PROVIDER_SITE_OTHER): Payer: PPO | Admitting: Obstetrics and Gynecology

## 2022-01-06 ENCOUNTER — Encounter: Payer: Self-pay | Admitting: Obstetrics and Gynecology

## 2022-01-06 ENCOUNTER — Other Ambulatory Visit: Payer: Self-pay

## 2022-01-06 VITALS — BP 160/80 | HR 72 | Ht 61.0 in | Wt 102.4 lb

## 2022-01-06 DIAGNOSIS — Z9889 Other specified postprocedural states: Secondary | ICD-10-CM

## 2022-01-06 NOTE — Progress Notes (Signed)
HPI: ?     [name] is a 81 y.o. [id number] who LMP was No LMP recorded. Patient is postmenopausal. ? ?Subjective:  ? ?She presents today approximately 10 days postop from surgery.  She is doing well.  She already has an appointment with medical oncology.  She has noted her pathology report. ?She states that her bowels are now beginning to move appropriately and she generally feels well.  No longer taking pain medications. ? ?  Hx: ?The following portions of the patient's history were reviewed and updated as appropriate: ?            She  has a past medical history of Allergic rhinitis, Anemia, Anginal pain ([place]), Arthritis, Atypical chest pain, Breast cancer ([place]), Ductal carcinoma in situ (DCIS) of right breast, Fibrocystic breast disease, GERD (gastroesophageal reflux disease), Hearing aid worn, History of hiatal hernia, Hyperlipidemia, Lung nodule, Menopause, Migraine, Migraine, Motion sickness, Plantar fasciitis, Prolapse of female pelvic organs, Raynaud's disease, Sciatic leg pain, Sciatica, Skin cancer of face, Urinary retention with incomplete bladder emptying, Uterine carcinosarcoma ([place]) (11/2021), and Vitreous detachment of both eyes. ?She does not have any pertinent problems on file. ?She  has a past surgical history that includes Appendectomy (1952); Cardiac catheterization (2004); Skin cancer excision; Colonoscopy with propofol (N/A, 03/27/2016); Breast lumpectomy with radioactive seed localization (Right, 05/29/2020); Colonoscopy with propofol (N/A, 08/14/2020); Esophagogastroduodenoscopy (egd) with propofol (N/A, 08/14/2020); Cataract extraction w/PHACO (Right, 02/26/2021); Esophagogastroduodenoscopy (egd) with propofol (N/A, 03/21/2021); Cataract extraction w/PHACO (Left, 09/03/2021); Dilatation & curettage/hysteroscopy with myosure (N/A, 12/01/2021); Esophagogastroduodenoscopy (08/1999); Colonoscopy; Total laparoscopic hysterectomy with bilateral salpingo oophorectomy (Bilateral, 12/24/2021);  and Cystoscopy (N/A, 12/24/2021). ?Her family history includes Breast cancer in her mother; Colon cancer in her brother; Heart disease in her father. ?She  reports that she has never smoked. She has never used smokeless tobacco. She reports that she does not drink alcohol and does not use drugs. ?She has a current medication list which includes the following prescription(s): acetaminophen, alum & mag hydroxide-simeth, calcium-vitamin d, carboxymethylcellulose sod pf, celecoxib, vitamin d, coenzyme q10, cyanocobalamin, gabapentin, vitron-c, omega-3 fatty acids, omeprazole, pantoprazole, restasis, simvastatin, topiramate, triamcinolone acetonide, and vitamin e. ?She has No Known Allergies. ?      ?Review of Systems:  ?Review of Systems ? ?Constitutional: Denied constitutional symptoms, night sweats, recent illness, fatigue, fever, insomnia and weight loss.  ?Eyes: Denied eye symptoms, eye pain, photophobia, vision change and visual disturbance.  ?Ears/Nose/Throat/Neck: Denied ear, nose, throat or neck symptoms, hearing loss, nasal discharge, sinus congestion and sore throat.  ?Cardiovascular: Denied cardiovascular symptoms, arrhythmia, chest pain/pressure, edema, exercise intolerance, orthopnea and palpitations.  ?Respiratory: Denied pulmonary symptoms, asthma, pleuritic pain, productive sputum, cough, dyspnea and wheezing.  ?Gastrointestinal: Denied, gastro-esophageal reflux, melena, nausea and vomiting.  ?Genitourinary: Denied genitourinary symptoms including symptomatic vaginal discharge, pelvic relaxation issues, and urinary complaints.  ?Musculoskeletal: Denied musculoskeletal symptoms, stiffness, swelling, muscle weakness and myalgia.  ?Dermatologic: Denied dermatology symptoms, rash and scar.  ?Neurologic: Denied neurology symptoms, dizziness, headache, neck pain and syncope.  ?Psychiatric: Denied psychiatric symptoms, anxiety and depression.  ?Endocrine: Denied endocrine symptoms including hot flashes and night  sweats.  ? ?Meds: ?  ?Current Outpatient Medications on File Prior to Visit  ?Medication Sig Dispense Refill  ? acetaminophen (TYLENOL) 500 MG tablet Take 1,000 mg by mouth every 6 (six) hours as needed for moderate pain or mild pain.    ? alum & mag hydroxide-simeth (MAALOX/MYLANTA) 200-200-20 MG/5ML suspension Take 30 mLs by mouth every 6 (six) hours  as needed for indigestion or heartburn.    ? Calcium-Vitamin D (CALTRATE 600 PLUS-VIT D PO) Take 1 tablet by mouth in the morning.    ? Carboxymethylcellulose Sod PF 1 % GEL Place 1 drop into both eyes at bedtime.    ? celecoxib (CELEBREX) 200 MG capsule Take 200 mg by mouth 2 (two) times daily.    ? Cholecalciferol (VITAMIN D) 125 MCG (5000 UT) CAPS Take 5,000 Units by mouth in the morning.    ? Coenzyme Q10 200 MG TABS Take 200 mg by mouth in the morning.    ? Cyanocobalamin (VITAMIN B-12 PO) Take 1 tablet by mouth in the morning.    ? gabapentin (NEURONTIN) 100 MG capsule Take 100 mg by mouth 4 (four) times daily.    ? Iron-Vitamin C (VITRON-C) 65-125 MG TABS Take 1 tablet by mouth in the morning.    ? Omega-3 Fatty Acids (FISH OIL PO) Take 1,400 mg by mouth in the morning.    ? omeprazole (PRILOSEC) 40 MG capsule Take 40 mg by mouth daily in the afternoon.    ? pantoprazole (PROTONIX) 40 MG tablet Take 40 mg by mouth in the morning.    ? RESTASIS 0.05 % ophthalmic emulsion Place 1 drop into both eyes 2 (two) times daily.    ? simvastatin (ZOCOR) 10 MG tablet Take 10 mg by mouth every evening.    ? topiramate (TOPAMAX) 50 MG tablet Take 50 mg by mouth every evening.    ? Triamcinolone Acetonide (NASACORT ALLERGY 24HR NA) Place 1 spray into the nose daily as needed (sinus congestion).    ? vitamin E 180 MG (400 UNITS) capsule Take 400 Units by mouth in the morning.    ? ?No current facility-administered medications on file prior to visit.  ? ? ? ? ?Objective:  ?  ? ?Vitals:  ? 01/06/22 1100  ?BP: (!) 160/80  ?Pulse: 72  ? ?Filed Weights  ? 01/06/22 1100  ?Weight:  102 lb 6.4 oz (46.4 kg)  ? ?  ?          ?Abdomen: Soft.  Non-tender.  No masses.  No HSM.  ?Incision/s: Intact.  Healing well.  No erythema.  No drainage.  ? ? ?        ? ?Assessment:  ?  ?G2P2002 ?Patient Active Problem List  ? Diagnosis Date Noted  ? Postoperative state 12/24/2021  ? Endometrial cancer ([place])   ? Lung granuloma ([place]) 12/10/2021  ? B12 deficiency 09/25/2021  ? History of ductal carcinoma in situ (DCIS) of breast 09/10/2020  ? Iron deficiency anemia 09/10/2020  ? Ductal carcinoma in situ (DCIS) of right breast 05/17/2020  ? Bilateral carotid artery stenosis 02/06/2020  ? Visual changes 01/12/2020  ? Carotid stenosis 01/11/2020  ? Migraine aura without headache 03/07/2019  ? Complete tear of left rotator cuff 03/10/2017  ? Pessary maintenance 12/18/2016  ? Rectocele 12/18/2016  ? Midline cystocele 12/18/2016  ? Menopausal syndrome on hormone replacement therapy 12/18/2016  ? Allergic rhinitis 12/17/2015  ? Atypical chest pain 12/17/2015  ? Bloodgood disease 12/17/2015  ? Acid reflux 12/17/2015  ? HLD (hyperlipidemia) 12/17/2015  ? Lung mass 12/17/2015  ? Uterine prolapse 12/17/2015  ? CA of skin 12/17/2015  ? Post menopausal syndrome 01/14/2015  ? Incomplete bladder emptying 01/14/2015  ? Neuralgia neuritis, sciatic nerve 01/14/2015  ? H/O malignant neoplasm of skin 09/17/2014  ? ?  ?1. Postoperative state   ? ? Patient doing very well surgically. ? ? ?  Plan:  ?  ?       ? 1.  Pathology results reviewed. ? 2.  She will follow-up with medical and surgical oncology in the near future for chemo/radiotherapy as needed. ?Orders ?No orders of the defined types were placed in this encounter. ? ? No orders of the defined types were placed in this encounter. ?  ?  F/U ? No follow-ups on file. ? ?Finis Bud, M.D. ?01/06/2022 ?11:19 AM ? ? ? ? ?

## 2022-01-06 NOTE — H&P (View-Only) (Signed)
HPI: ?     [name] is a 81 y.o. [id number] who LMP was No LMP recorded. Patient is postmenopausal. ? ?Subjective:  ? ?She presents today approximately 10 days postop from surgery.  She is doing well.  She already has an appointment with medical oncology.  She has noted her pathology report. ?She states that her bowels are now beginning to move appropriately and she generally feels well.  No longer taking pain medications. ? ?  Hx: ?The following portions of the patient's history were reviewed and updated as appropriate: ?            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 ?She does not have any pertinent problems on file. ?She  has a past surgical history that includes Appendectomy (1952); Cardiac catheterization (2004); Skin cancer excision; Colonoscopy with propofol (N/A, 03/27/2016); Breast lumpectomy with radioactive seed localization (Right, 05/29/2020); Colonoscopy with propofol (N/A, 08/14/2020); Esophagogastroduodenoscopy (egd) with propofol (N/A, 08/14/2020); Cataract extraction w/PHACO (Right, 02/26/2021); Esophagogastroduodenoscopy (egd) with propofol (N/A, 03/21/2021); Cataract extraction w/PHACO (Left, 09/03/2021); Dilatation & curettage/hysteroscopy with myosure (N/A, 12/01/2021); Esophagogastroduodenoscopy (08/1999); Colonoscopy; Total laparoscopic hysterectomy with bilateral salpingo oophorectomy (Bilateral, 12/24/2021);  and Cystoscopy (N/A, 12/24/2021). ?Her family history includes Breast cancer in her mother; Colon cancer in her brother; Heart disease in her father. ?She  reports that she has never smoked. She has never used smokeless tobacco. She reports that she does not drink alcohol and does not use drugs. ?She has a current medication list which includes the following prescription(s): acetaminophen, alum & mag hydroxide-simeth, calcium-vitamin d, carboxymethylcellulose sod pf, celecoxib, vitamin d, coenzyme q10, cyanocobalamin, gabapentin, vitron-c, omega-3 fatty acids, omeprazole, pantoprazole, restasis, simvastatin, topiramate, triamcinolone acetonide, and vitamin e. ?She has No Known Allergies. ?      ?Review of Systems:  ?Review of Systems ? ?Constitutional: Denied constitutional symptoms, night sweats, recent illness, fatigue, fever, insomnia and weight loss.  ?Eyes: Denied eye symptoms, eye pain, photophobia, vision change and visual disturbance.  ?Ears/Nose/Throat/Neck: Denied ear, nose, throat or neck symptoms, hearing loss, nasal discharge, sinus congestion and sore throat.  ?Cardiovascular: Denied cardiovascular symptoms, arrhythmia, chest pain/pressure, edema, exercise intolerance, orthopnea and palpitations.  ?Respiratory: Denied pulmonary symptoms, asthma, pleuritic pain, productive sputum, cough, dyspnea and wheezing.  ?Gastrointestinal: Denied, gastro-esophageal reflux, melena, nausea and vomiting.  ?Genitourinary: Denied genitourinary symptoms including symptomatic vaginal discharge, pelvic relaxation issues, and urinary complaints.  ?Musculoskeletal: Denied musculoskeletal symptoms, stiffness, swelling, muscle weakness and myalgia.  ?Dermatologic: Denied dermatology symptoms, rash and scar.  ?Neurologic: Denied neurology symptoms, dizziness, headache, neck pain and syncope.  ?Psychiatric: Denied psychiatric symptoms, anxiety and depression.  ?Endocrine: Denied endocrine symptoms including hot flashes and night  sweats.  ? ?Meds: ?  ?Current Outpatient Medications on File Prior to Visit  ?Medication Sig Dispense Refill  ? acetaminophen (TYLENOL) 500 MG tablet Take 1,000 mg by mouth every 6 (six) hours as needed for moderate pain or mild pain.    ? alum & mag hydroxide-simeth (MAALOX/MYLANTA) 200-200-20 MG/5ML suspension Take 30 mLs by mouth every 6 (six) hours  as needed for indigestion or heartburn.    ? Calcium-Vitamin D (CALTRATE 600 PLUS-VIT D PO) Take 1 tablet by mouth in the morning.    ? Carboxymethylcellulose Sod PF 1 % GEL Place 1 drop into both eyes at bedtime.    ? celecoxib (CELEBREX) 200 MG capsule Take 200 mg by mouth 2 (two) times daily.    ? Cholecalciferol (VITAMIN D) 125 MCG (5000 UT) CAPS Take 5,000 Units by mouth in the morning.    ? Coenzyme Q10 200 MG TABS Take 200 mg by mouth in the morning.    ? Cyanocobalamin (VITAMIN B-12 PO) Take 1 tablet by mouth in the morning.    ? gabapentin (NEURONTIN) 100 MG capsule Take 100 mg by mouth 4 (four) times daily.    ? Iron-Vitamin C (VITRON-C) 65-125 MG TABS Take 1 tablet by mouth in the morning.    ? Omega-3 Fatty Acids (FISH OIL PO) Take 1,400 mg by mouth in the morning.    ? omeprazole (PRILOSEC) 40 MG capsule Take 40 mg by mouth daily in the afternoon.    ? pantoprazole (PROTONIX) 40 MG tablet Take 40 mg by mouth in the morning.    ? RESTASIS 0.05 % ophthalmic emulsion Place 1 drop into both eyes 2 (two) times daily.    ? simvastatin (ZOCOR) 10 MG tablet Take 10 mg by mouth every evening.    ? topiramate (TOPAMAX) 50 MG tablet Take 50 mg by mouth every evening.    ? Triamcinolone Acetonide (NASACORT ALLERGY 24HR NA) Place 1 spray into the nose daily as needed (sinus congestion).    ? vitamin E 180 MG (400 UNITS) capsule Take 400 Units by mouth in the morning.    ? ?No current facility-administered medications on file prior to visit.  ? ? ? ? ?Objective:  ?  ? ?Vitals:  ? 01/06/22 1100  ?BP: (!) 160/80  ?Pulse: 72  ? ?Filed Weights  ? 01/06/22 1100  ?Weight:  102 lb 6.4 oz (46.4 kg)  ? ?  ?          ?Abdomen: Soft.  Non-tender.  No masses.  No HSM.  ?Incision/s: Intact.  Healing well.  No erythema.  No drainage.  ? ? ?        ? ?Assessment:  ?  ?G2P2002 ?Patient Active Problem List  ? Diagnosis Date Noted  ? Postoperative state 12/24/2021  ? Endometrial cancer ([place])   ? Lung granuloma ([place]) 12/10/2021  ? B12 deficiency 09/25/2021  ? History of ductal carcinoma in situ (DCIS) of breast 09/10/2020  ? Iron deficiency anemia 09/10/2020  ? Ductal carcinoma in situ (DCIS) of right breast 05/17/2020  ? Bilateral carotid artery stenosis 02/06/2020  ? Visual changes 01/12/2020  ? Carotid stenosis 01/11/2020  ? Migraine aura without headache 03/07/2019  ? Complete tear of left rotator cuff 03/10/2017  ? Pessary maintenance 12/18/2016  ? Rectocele 12/18/2016  ? Midline cystocele 12/18/2016  ? Menopausal syndrome on hormone replacement therapy 12/18/2016  ? Allergic rhinitis 12/17/2015  ? Atypical chest pain 12/17/2015  ? Bloodgood disease 12/17/2015  ? Acid reflux 12/17/2015  ? HLD (hyperlipidemia) 12/17/2015  ? Lung mass 12/17/2015  ? Uterine prolapse 12/17/2015  ? CA of skin 12/17/2015  ? Post menopausal syndrome 01/14/2015  ? Incomplete bladder emptying 01/14/2015  ? Neuralgia neuritis, sciatic nerve 01/14/2015  ? H/O malignant neoplasm of skin 09/17/2014  ? ?  ?1. Postoperative state   ? ? Patient doing very well surgically. ? ? ?  Plan:  ?  ?       ? 1.  Pathology results reviewed. ? 2.  She will follow-up with medical and surgical oncology in the near future for chemo/radiotherapy as needed. ?Orders ?No orders of the defined types were placed in this encounter. ? ? No orders of the defined types were placed in this encounter. ?  ?  F/U ? No follow-ups on file. ? ?Finis Bud, M.D. ?01/06/2022 ?11:19 AM ? ? ? ? ?

## 2022-01-06 NOTE — Progress Notes (Signed)
Patient presents today for a follow-up from hysterectomy. She states having trouble with bowels now due to being unable to push, using Milk of Mag to help. Patient states her incisions are very tender but healing. She reports only taking extra strength tylenol for pain control. Patient states no other questions at this time.  ?

## 2022-01-09 ENCOUNTER — Encounter: Payer: Self-pay | Admitting: *Deleted

## 2022-01-12 ENCOUNTER — Inpatient Hospital Stay: Payer: PPO

## 2022-01-12 ENCOUNTER — Encounter: Payer: Self-pay | Admitting: Oncology

## 2022-01-12 ENCOUNTER — Other Ambulatory Visit: Payer: Self-pay

## 2022-01-12 ENCOUNTER — Inpatient Hospital Stay: Payer: PPO | Attending: Obstetrics and Gynecology | Admitting: Oncology

## 2022-01-12 ENCOUNTER — Inpatient Hospital Stay: Payer: PPO | Admitting: Internal Medicine

## 2022-01-12 VITALS — BP 171/81 | HR 80 | Temp 98.7°F | Resp 20 | Wt 100.1 lb

## 2022-01-12 DIAGNOSIS — D649 Anemia, unspecified: Secondary | ICD-10-CM

## 2022-01-12 DIAGNOSIS — G8918 Other acute postprocedural pain: Secondary | ICD-10-CM | POA: Diagnosis not present

## 2022-01-12 DIAGNOSIS — Z79899 Other long term (current) drug therapy: Secondary | ICD-10-CM | POA: Insufficient documentation

## 2022-01-12 DIAGNOSIS — C541 Malignant neoplasm of endometrium: Secondary | ICD-10-CM | POA: Insufficient documentation

## 2022-01-12 DIAGNOSIS — E538 Deficiency of other specified B group vitamins: Secondary | ICD-10-CM | POA: Diagnosis not present

## 2022-01-12 DIAGNOSIS — Z5111 Encounter for antineoplastic chemotherapy: Secondary | ICD-10-CM | POA: Diagnosis not present

## 2022-01-12 DIAGNOSIS — C801 Malignant (primary) neoplasm, unspecified: Secondary | ICD-10-CM | POA: Diagnosis not present

## 2022-01-12 DIAGNOSIS — Z5189 Encounter for other specified aftercare: Secondary | ICD-10-CM | POA: Insufficient documentation

## 2022-01-12 DIAGNOSIS — Z7189 Other specified counseling: Secondary | ICD-10-CM | POA: Diagnosis not present

## 2022-01-12 DIAGNOSIS — D509 Iron deficiency anemia, unspecified: Secondary | ICD-10-CM | POA: Diagnosis not present

## 2022-01-13 ENCOUNTER — Telehealth (INDEPENDENT_AMBULATORY_CARE_PROVIDER_SITE_OTHER): Payer: Self-pay

## 2022-01-13 ENCOUNTER — Telehealth: Payer: Self-pay

## 2022-01-13 NOTE — Progress Notes (Signed)
? ?Hematology/Oncology Consult note ?[facility] ?[phone] [phone] ? ?Patient Care Team: ?Adin Hector, MD as PCP - General (Internal Medicine) ?Rockwell Germany, RN as Oncology Nurse Navigator ?Mauro Kaufmann, RN as Oncology Nurse Navigator ?Rolm Bookbinder, MD as Consulting Physician (General Surgery) ?Eppie Gibson, MD as Attending Physician (Radiation Oncology) ?Rubie Maid, MD as Consulting Physician (Obstetrics and Gynecology) ?Clent Jacks, RN as Oncology Nurse Navigator ?Sindy Guadeloupe, MD as Consulting Physician (Oncology)  ? ?Name of the patient: Elizabeth Friedman  ?[id number]  ?Jul 14, 1941  ? ? 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  ?Referring physician-Dr. Theora Gianotti ? ?Date of visit: 01/15/22 ? ? ?History of presenting illness- Patient is a 81 year old female who was having pelvic pain symptoms for a month.  Initially she saw GI but given her history of pessary placement for 20 years she decided to see GYN.  She had an ultrasound pelvis which showed mixed echogenicity mass in the endometrial canal measuring 3.7 x 4.1 x 3.3 cm.  She was also having some vaginal spotting and brown discharge.  She underwent hysteroscopy with removal of endometrial mass and D&C which showed carcinosarcoma/malignant mixed m?llerian tumor. ? ?CT chest abdomen and pelvis showed enhancing lesion in the anterior fundus but no other evidence of distant metastatic disease in chest abdomen and pelvis. ? 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  She was following up with Dr. Jana Hakim at [facility] previously.  She is not on any endocrine therapy for the same. ? ?Patient underwent total laparoscopic hysterectomy with bilateral salpingo-oophorectomy and bilateral pelvic sentinel lymph node injection mapping and biopsies on 12/24/2021.  Final pathology showed a 3.9 cm carcinosarcoma.  Less than 50%  myometrial invasion.  Uterine serosa not identified.  Malignant cells were present in the peritoneal/ascites fluid.  Lymphatic invasion not identified.  Margins negative.  2 sentinel lymph nodes on the right and left iliac were negative for malignancy.  No evidence of malignancy in the fallopian tubes.  Cystadenofibroma noted in the ovaries pT1a pN0 FIGO stage IA ? ?Patient is here with her daughter and son.  She lives alone and is independent of her ADLs.  She reports feeling cold most of the time and reports having mild peripheral neuropathy in her feet.  She is on gabapentin for the same. ? ?ECOG PS- 1 ? ?Pain scale- 0 ? ? ?Review of systems- Review of Systems  ?Constitutional:  Negative for chills, fever, malaise/fatigue and weight loss.  ?HENT:  Negative for congestion, ear discharge and nosebleeds.   ?Eyes:  Negative for blurred vision.  ?Respiratory:  Negative for cough, hemoptysis, sputum production, shortness of breath and wheezing.   ?Cardiovascular:  Negative for chest pain, palpitations, orthopnea and claudication.  ?Gastrointestinal:  Negative for abdominal pain, blood in stool, constipation, diarrhea, heartburn, melena, nausea and vomiting.  ?Genitourinary:  Negative for dysuria, flank pain, frequency, hematuria and urgency.  ?Musculoskeletal:  Negative for back pain, joint pain and myalgias.  ?Skin:  Negative for rash.  ?Neurological:  Positive for sensory change (Peripheral neuropathy). Negative for dizziness, tingling, focal weakness, seizures, weakness and headaches.  ?Endo/Heme/Allergies:  Does not bruise/bleed easily.  ?Psychiatric/Behavioral:  Negative for depression and suicidal ideas. The patient does not have insomnia.   ? ?No Known Allergies ? ?Patient Active Problem List  ? Diagnosis Date Noted  ? Postoperative state 12/24/2021  ? Endometrial cancer ([place])   ? Lung granuloma ([place]) 12/10/2021  ?  B12 deficiency 09/25/2021  ? History of ductal carcinoma in situ (DCIS) of breast 09/10/2020  ?  Iron deficiency anemia 09/10/2020  ? Ductal carcinoma in situ (DCIS) of right breast 05/17/2020  ? Bilateral carotid artery stenosis 02/06/2020  ? Visual changes 01/12/2020  ? Carotid stenosis 01/11/2020  ? Migraine aura without headache 03/07/2019  ? Complete tear of left rotator cuff 03/10/2017  ? Pessary maintenance 12/18/2016  ? Rectocele 12/18/2016  ? Midline cystocele 12/18/2016  ? Menopausal syndrome on hormone replacement therapy 12/18/2016  ? Allergic rhinitis 12/17/2015  ? Atypical chest pain 12/17/2015  ? Bloodgood disease 12/17/2015  ? Acid reflux 12/17/2015  ? HLD (hyperlipidemia) 12/17/2015  ? Lung mass 12/17/2015  ? Uterine prolapse 12/17/2015  ? CA of skin 12/17/2015  ? Post menopausal syndrome 01/14/2015  ? Incomplete bladder emptying 01/14/2015  ? Neuralgia neuritis, sciatic nerve 01/14/2015  ? H/O malignant neoplasm of skin 09/17/2014  ? ? ? ?Past Medical History:  ?Diagnosis Date  ? Allergic rhinitis   ? Anemia   ? Anginal pain ([place])   ? Arthritis   ? Atypical chest pain   ? Breast cancer ([place])   ? 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[place])   ? Ductal carcinoma in situ (DCIS) of right breast   ? Fibrocystic breast disease   ? GERD (gastroesophageal reflux disease)   ? Hearing aid worn   ? bilateral  ? History of hiatal hernia   ? Hyperlipidemia   ? Lung nodule   ? Menopause   ? Migraine   ? Migraine   ? optical migraines  ? Motion sickness   ? Plantar fasciitis   ? Prolapse of female pelvic organs   ? hodge pessary  ? Raynaud's disease   ? Sciatic leg pain   ? Sciatica   ? Skin cancer of face   ? Urinary retention with incomplete bladder emptying   ? Uterine carcinosarcoma ([place]) 11/2021  ? Vitreous detachment of both eyes   ? ? ? ?Past Surgical History:  ?Procedure Laterality Date  ? APPENDECTOMY  1951  ? BREAST LUMPECTOMY WITH RADIOACTIVE SEED LOCALIZATION Right 05/29/2020  ? Procedure: RIGHT BREAST LUMPECTOMY WITH RADIOACTIVE SEED LOCALIZATION;  Surgeon: Rolm Bookbinder, MD;  Location: [facility];   Service: General;  Laterality: Right;  ? CARDIAC CATHETERIZATION  2004  ? negative  ? CATARACT EXTRACTION W/PHACO Right 02/26/2021  ? Procedure: CATARACT EXTRACTION PHACO AND INTRAOCULAR LENS PLACEMENT (I[facility]) RIGHT 5.86 01:19.6 7.4%;  Surgeon: Leandrew Koyanagi, MD;  Location: [facility];  Service: Ophthalmology;  Laterality: Right;  ? CATARACT EXTRACTION W/PHACO Left 09/03/2021  ? Procedure: CATARACT EXTRACTION PHACO AND INTRAOCULAR LENS PLACEMENT ([place]) LEFT;  Surgeon: Leandrew Koyanagi, MD;  Location: [facility];  Service: Ophthalmology;  Laterality: Left;  9.61 ?01:15.9  ? COLONOSCOPY    ? 2000, 2006, 2012  ? COLONOSCOPY WITH PROPOFOL N/A 03/27/2016  ? Procedure: COLONOSCOPY WITH PROPOFOL;  Surgeon: Manya Silvas, MD;  Location: [facility] ENDOSCOPY;  Service: Endoscopy;  Laterality: N/A;  ? COLONOSCOPY WITH PROPOFOL N/A 08/14/2020  ? Procedure: COLONOSCOPY WITH PROPOFOL;  Surgeon: Toledo, Benay Pike, MD;  Location: [facility] ENDOSCOPY;  Service: Gastroenterology;  Laterality: N/A;  ? CYSTOSCOPY N/A 12/24/2021  ? Procedure: CYSTOSCOPY;  Surgeon: Jaquita Folds, MD;  Location: [facility] ORS;  Service: Gynecology;  Laterality: N/A;  ? DILATATION & CURETTAGE/HYSTEROSCOPY WITH MYOSURE N/A 12/01/2021  ? Procedure: [place];  Surgeon: Rubie Maid, MD;  Location: [facility] ORS;  Service: Gynecology;  Laterality: N/A;  ?  ESOPHAGOGASTRODUODENOSCOPY  08/1999  ? ESOPHAGOGASTRODUODENOSCOPY (EGD) WITH PROPOFOL N/A 08/14/2020  ? Procedure: ESOPHAGOGASTRODUODENOSCOPY (EGD) WITH PROPOFOL;  Surgeon: Toledo, Benay Pike, MD;  Location: [facility] ENDOSCOPY;  Service: Gastroenterology;  Laterality: N/A;  ? ESOPHAGOGASTRODUODENOSCOPY (EGD) WITH PROPOFOL N/A 03/21/2021  ? Procedure: ESOPHAGOGASTRODUODENOSCOPY (EGD) WITH PROPOFOL;  Surgeon: Toledo, Benay Pike, MD;  Location: [facility] ENDOSCOPY;  Service: Gastroenterology;  Laterality: N/A;  ? SKIN CANCER EXCISION    ? face and neck  ? TOTAL  LAPAROSCOPIC HYSTERECTOMY WITH BILATERAL SALPINGO OOPHORECTOMY Bilateral 12/24/2021  ? Procedure: TOTAL LAPAROSCOPIC HYSTERECTOMY WITH BILATERAL SALPINGO OOPHORECTOMY, PELVIC NODE BIOPSY,  PESSARY REMOVAL (PESSARY REMO

## 2022-01-13 NOTE — Telephone Encounter (Signed)
Zollie Scale RN: ? ?Gale Journey, I was hoping we could get her a port placed this week. Family would really like Thursday is available. I told her I will call her with all the details tomorrow. ? ?Me: ? ?Steffanie Dunn, I can try as I have an open slot, Just know that prior auths have to be gotten before scheduling patients. Once I have all of that I will send you a message. ? ?Zollie Scale RN: ? ?Thank you ? ?Me: ? ? ?Hi Kristi, I have the patient on for 01/15/22 with a 9:00 am arrival to the MM, NPO after 12:00 midnight, one person with her and can take all meds with small sips of water. ? ? ?Zollie Scale RN: ? ?Thank you! ? ?Me: ? ?You're welcome ? ?

## 2022-01-13 NOTE — Progress Notes (Signed)
Met with [name] and her children following appointment with Dr. Janese Banks. Went over all upcoming appointments and provided printed copy in [facility]. Contacted [facility] and they are attempting to arrange port a cath placement for this Thursday. Pending insurance approval prior to scheduling. ?

## 2022-01-13 NOTE — Telephone Encounter (Signed)
Called and provided Elizabeth Friedman with instructions for port a cath. Scheduled for 01/15/22 with 0900 [facility]. May bring one support person. Do not eat or drink anything after midnight. May take medications that morning with a sip of water. All questions answered. ?

## 2022-01-14 ENCOUNTER — Encounter: Payer: Self-pay | Admitting: Oncology

## 2022-01-14 MED ORDER — PROCHLORPERAZINE MALEATE 10 MG PO TABS: 10.0000 mg | ORAL_TABLET | Freq: Four times a day (QID) | ORAL | 1 refills | Status: DC | PRN

## 2022-01-14 MED ORDER — ONDANSETRON HCL 8 MG PO TABS: 8.0000 mg | ORAL_TABLET | Freq: Two times a day (BID) | ORAL | 1 refills | Status: DC | PRN

## 2022-01-14 MED ORDER — DEXAMETHASONE 4 MG PO TABS: ORAL_TABLET | ORAL | 1 refills | Status: DC

## 2022-01-14 MED ORDER — LIDOCAINE-PRILOCAINE 2.5-2.5 % EX CREA: TOPICAL_CREAM | CUTANEOUS | 3 refills | Status: DC

## 2022-01-14 MED ORDER — LORAZEPAM 0.5 MG PO TABS: 0.5000 mg | ORAL_TABLET | Freq: Four times a day (QID) | ORAL | 0 refills | Status: DC | PRN

## 2022-01-14 NOTE — Progress Notes (Signed)
START ON PATHWAY REGIMEN - Uterine ? ? ?  A cycle is every 21 days: ?    Paclitaxel  ?    Carboplatin  ? ?**Always confirm dose/schedule in your pharmacy ordering system** ? ?Patient Characteristics: ?Carcinosarcoma, Newly Diagnosed, Postoperative (Pathologic Staging), Postoperative ?Histology: Carcinosarcoma ?Therapeutic Status: Newly Diagnosed, Postoperative (Pathologic Staging) ?AJCC M Category: cM0 ?AJCC 8 Stage Grouping: IA ?AJCC T Category: pT1a ?AJCC N Category: pN0 ?Intent of Therapy: ?Curative Intent, Discussed with Patient ?

## 2022-01-15 ENCOUNTER — Encounter: Payer: Self-pay | Admitting: Nurse Practitioner

## 2022-01-15 ENCOUNTER — Encounter
Admission: RE | Disposition: A | Discharge status: Home / Self Care | Payer: Self-pay | Source: Ambulatory Visit | Attending: Vascular Surgery

## 2022-01-15 ENCOUNTER — Encounter: Payer: Self-pay | Admitting: Vascular Surgery

## 2022-01-15 ENCOUNTER — Encounter: Payer: Self-pay | Admitting: Oncology

## 2022-01-15 ENCOUNTER — Ambulatory Visit
Admission: RE | Admit: 2022-01-15 | Discharge: 2022-01-15 | Disposition: A | Discharge status: Home / Self Care | Payer: PPO | Source: Ambulatory Visit | Attending: Vascular Surgery | Admitting: Vascular Surgery

## 2022-01-15 DIAGNOSIS — I7300 Raynaud's syndrome without gangrene: Secondary | ICD-10-CM | POA: Insufficient documentation

## 2022-01-15 DIAGNOSIS — Z79899 Other long term (current) drug therapy: Secondary | ICD-10-CM | POA: Insufficient documentation

## 2022-01-15 DIAGNOSIS — C801 Malignant (primary) neoplasm, unspecified: Secondary | ICD-10-CM | POA: Insufficient documentation

## 2022-01-15 DIAGNOSIS — K219 Gastro-esophageal reflux disease without esophagitis: Secondary | ICD-10-CM | POA: Insufficient documentation

## 2022-01-15 DIAGNOSIS — E785 Hyperlipidemia, unspecified: Secondary | ICD-10-CM | POA: Diagnosis not present

## 2022-01-15 DIAGNOSIS — Z85828 Personal history of other malignant neoplasm of skin: Secondary | ICD-10-CM | POA: Insufficient documentation

## 2022-01-15 HISTORY — PX: PORTA CATH INSERTION: CATH118285

## 2022-01-15 SURGERY — PORTA CATH INSERTION
Anesthesia: Moderate Sedation

## 2022-01-15 MED ORDER — METHYLPREDNISOLONE SODIUM SUCC 125 MG IJ SOLR: 125.0000 mg | Freq: Once | INTRAMUSCULAR | Status: DC | PRN

## 2022-01-15 MED ORDER — CHLORHEXIDINE GLUCONATE CLOTH 2 % EX PADS
6.0000 | MEDICATED_PAD | Freq: Every day | CUTANEOUS | Status: DC
  Administered 2022-01-15: 6 via TOPICAL

## 2022-01-15 MED ORDER — MIDAZOLAM HCL 2 MG/2ML IJ SOLN
INTRAMUSCULAR | Status: AC
  Filled 2022-01-15: qty 2

## 2022-01-15 MED ORDER — FENTANYL CITRATE PF 50 MCG/ML IJ SOSY
PREFILLED_SYRINGE | INTRAMUSCULAR | Status: AC
  Filled 2022-01-15: qty 1

## 2022-01-15 MED ORDER — DIPHENHYDRAMINE HCL 50 MG/ML IJ SOLN: 50.0000 mg | Freq: Once | INTRAMUSCULAR | Status: DC | PRN

## 2022-01-15 MED ORDER — CEFAZOLIN SODIUM-DEXTROSE 1-4 GM/50ML-% IV SOLN
INTRAVENOUS | Status: DC | PRN
  Administered 2022-01-15: 2 g via INTRAVENOUS

## 2022-01-15 MED ORDER — MIDAZOLAM HCL 2 MG/2ML IJ SOLN
INTRAMUSCULAR | Status: DC | PRN
  Administered 2022-01-15: 1 mg via INTRAVENOUS

## 2022-01-15 MED ORDER — FENTANYL CITRATE (PF) 100 MCG/2ML IJ SOLN
INTRAMUSCULAR | Status: DC | PRN
  Administered 2022-01-15: 50 ug via INTRAVENOUS

## 2022-01-15 MED ORDER — HYDROMORPHONE HCL 1 MG/ML IJ SOLN: 1.0000 mg | Freq: Once | INTRAMUSCULAR | Status: DC | PRN

## 2022-01-15 MED ORDER — FAMOTIDINE 20 MG PO TABS: 40.0000 mg | ORAL_TABLET | Freq: Once | ORAL | Status: DC | PRN

## 2022-01-15 MED ORDER — SODIUM CHLORIDE 0.9 % IV SOLN: INTRAVENOUS | Status: DC

## 2022-01-15 MED ORDER — SODIUM CHLORIDE 0.9 % IV SOLN
80.0000 mg | Freq: Once | INTRAVENOUS | Status: AC
  Administered 2022-01-15: 80 mg
  Filled 2022-01-15: qty 2

## 2022-01-15 MED ORDER — CEFAZOLIN SODIUM-DEXTROSE 2-4 GM/100ML-% IV SOLN: 2.0000 g | Freq: Once | INTRAVENOUS | Status: DC

## 2022-01-15 MED ORDER — CEFAZOLIN SODIUM-DEXTROSE 2-4 GM/100ML-% IV SOLN
INTRAVENOUS | Status: AC
  Filled 2022-01-15: qty 100

## 2022-01-15 MED ORDER — MIDAZOLAM HCL 2 MG/ML PO SYRP: 8.0000 mg | ORAL_SOLUTION | Freq: Once | ORAL | Status: DC | PRN

## 2022-01-15 MED ORDER — ONDANSETRON HCL 4 MG/2ML IJ SOLN: 4.0000 mg | Freq: Four times a day (QID) | INTRAMUSCULAR | Status: DC | PRN

## 2022-01-15 SURGICAL SUPPLY — 14 items
ADH SKN CLS APL DERMABOND .7 (GAUZE/BANDAGES/DRESSINGS) ×1
COVER PROBE U/S 5X48 (MISCELLANEOUS) ×1 IMPLANT
COVER SURGICAL LIGHT HANDLE (MISCELLANEOUS) ×1 IMPLANT
DERMABOND ADVANCED (GAUZE/BANDAGES/DRESSINGS) ×1
DERMABOND ADVANCED .7 DNX12 (GAUZE/BANDAGES/DRESSINGS) IMPLANT
HANDLE YANKAUER SUCT BULB TIP (MISCELLANEOUS) ×1 IMPLANT
KIT PORT POWER 8FR ISP CVUE (Port) ×1 IMPLANT
PACK ANGIOGRAPHY (CUSTOM PROCEDURE TRAY) ×2 IMPLANT
PENCIL ELECTRO HAND CTR (MISCELLANEOUS) ×1 IMPLANT
SPONGE XRAY 4X4 16PLY STRL (MISCELLANEOUS) ×2 IMPLANT
SUT MNCRL AB 4-0 PS2 18 (SUTURE) ×1 IMPLANT
SUT VIC AB 3-0 SH 27 (SUTURE) ×2
SUT VIC AB 3-0 SH 27X BRD (SUTURE) IMPLANT
TUBING CONNECTING 10 (TUBING) ×2 IMPLANT

## 2022-01-15 NOTE — Interval H&P Note (Signed)
History and Physical Interval Note: ? ?01/15/2022 ?9:10 AM ? ?Elizabeth Friedman  has presented today for surgery, with the diagnosis of Porta Cath Insertion   Carcinosarcoma.  The various methods of treatment have been discussed with the patient and family. After consideration of risks, benefits and other options for treatment, the patient has consented to  Procedure(s): ?PORTA CATH INSERTION (N/A) as a surgical intervention.  The patient's history has been reviewed, patient examined, no change in status, stable for surgery.  I have reviewed the patient's chart and labs.  Questions were answered to the patient's satisfaction.   ? ? ?Leotis Pain ? ? ?

## 2022-01-15 NOTE — Op Note (Signed)
?       VEIN AND VASCULAR SURGERY ?      Operative Note ? ?Date: 01/15/2022 ? ?Preoperative diagnosis:  1. carcinosarcoma ? ?Postoperative diagnosis:  Same as above ? ?Procedures: ?#1. Ultrasound guidance for vascular access to the right internal jugular vein. ?#2. Fluoroscopic guidance for placement of catheter. ?#3. Placement of CT compatible Port-A-Cath, right internal jugular vein. ? ?Surgeon: Leotis Pain, MD.  ? ?Anesthesia: Local with moderate conscious sedation for approximately 17  minutes using 1 mg of Versed and 50 mcg of Fentanyl ? ?Fluoroscopy time: less than 1 minute ? ?Contrast used: 0 ? ?Estimated blood loss: 5 cc ? ?Indication for the procedure:  The patient is a 81 y.o.female with carcinosarcoma.  The patient needs a Port-A-Cath for durable venous access, chemotherapy, lab draws, and CT scans. We are asked to place this. Risks and benefits were discussed and informed consent was obtained. ? ?Description of procedure: The patient was brought to the vascular and interventional radiology suite.  Moderate conscious sedation was administered throughout the procedure during a face to face encounter with the patient with my supervision of the RN administering medicines and monitoring the patient's vital signs, pulse oximetry, telemetry and mental status throughout from the start of the procedure until the patient was taken to the recovery room. The right neck chest and shoulder were sterilely prepped and draped, and a sterile surgical field was created. Ultrasound was used to help visualize a patent right internal jugular vein. This was then accessed under direct ultrasound guidance without difficulty with the Seldinger needle and a permanent image was recorded. A J-wire was placed. After skin nick and dilatation, the peel-away sheath was then placed over the wire. I then anesthetized an area under the clavicle approximately 1-2 fingerbreadths. A transverse incision was created and an inferior pocket  was created with electrocautery and blunt dissection. The port was then brought onto the field, placed into the pocket and secured to the chest wall with 2 Prolene sutures. The catheter was connected to the port and tunneled from the subclavicular incision to the access site. Fluoroscopic guidance was then used to cut the catheter to an appropriate length. The catheter was then placed through the peel-away sheath and the peel-away sheath was removed. The catheter tip was parked in excellent location under fluorocoscopic guidance in the cavoatrial junction. The pocket was then irrigated with antibiotic impregnated saline and the wound was closed with a running 3-0 Vicryl and a 4-0 Monocryl. The access incision was closed with a single 4-0 Monocryl. The Huber needle was used to withdraw blood and flush the port with heparinized saline. Dermabond was then placed as a dressing. The patient tolerated the procedure well and was taken to the recovery room in stable condition. ? ? ?Leotis Pain ?01/15/2022 ?10:51 AM ? ? ?This note was created with Dragon Medical transcription system. Any errors in dictation are purely unintentional.  ?

## 2022-01-16 ENCOUNTER — Other Ambulatory Visit: Payer: Self-pay

## 2022-01-16 ENCOUNTER — Inpatient Hospital Stay: Payer: PPO

## 2022-01-16 NOTE — Progress Notes (Signed)
Pharmacist Chemotherapy Monitoring - Initial Assessment   ? ?Anticipated start date: 01/19/22  ? ?The following has been reviewed per standard work regarding the patient's treatment regimen: ?The patient's diagnosis, treatment plan and drug doses, and organ/hematologic function ?Lab orders and baseline tests specific to treatment regimen  ?The treatment plan start date, drug sequencing, and pre-medications ?Prior authorization status  ?Patient's documented medication list, including drug-drug interaction screen and prescriptions for anti-emetics and supportive care specific to the treatment regimen ?The drug concentrations, fluid compatibility, administration routes, and timing of the medications to be used ?The patient's access for treatment and lifetime cumulative dose history, if applicable  ?The patient's medication allergies and previous infusion related reactions, if applicable  ? ?Changes made to treatment plan:  ?treatment plan date ? ?Follow up needed:  ?N/A ? ? ?Adelina Mings, [facility], ?01/16/2022  9:26 AM  ?

## 2022-01-19 ENCOUNTER — Telehealth: Payer: Self-pay | Admitting: *Deleted

## 2022-01-19 ENCOUNTER — Encounter: Payer: Self-pay | Admitting: Oncology

## 2022-01-19 ENCOUNTER — Inpatient Hospital Stay (HOSPITAL_BASED_OUTPATIENT_CLINIC_OR_DEPARTMENT_OTHER): Payer: PPO | Admitting: Oncology

## 2022-01-19 ENCOUNTER — Inpatient Hospital Stay: Payer: PPO

## 2022-01-19 ENCOUNTER — Other Ambulatory Visit: Payer: Self-pay

## 2022-01-19 VITALS — BP 148/83 | HR 83 | Temp 98.7°F | Resp 20 | Wt 100.1 lb

## 2022-01-19 VITALS — BP 153/79 | HR 60

## 2022-01-19 DIAGNOSIS — C541 Malignant neoplasm of endometrium: Secondary | ICD-10-CM

## 2022-01-19 DIAGNOSIS — E538 Deficiency of other specified B group vitamins: Secondary | ICD-10-CM

## 2022-01-19 DIAGNOSIS — Z5111 Encounter for antineoplastic chemotherapy: Secondary | ICD-10-CM

## 2022-01-19 LAB — CBC WITH DIFFERENTIAL/PLATELET
Abs Immature Granulocytes: 0.01 10*3/uL (ref 0.00–0.07)
Basophils Absolute: 0.1 10*3/uL (ref 0.0–0.1)
Basophils Relative: 1 %
Eosinophils Absolute: 0.2 10*3/uL (ref 0.0–0.5)
Eosinophils Relative: 4 %
HCT: 31.8 % — ABNORMAL LOW (ref 36.0–46.0)
Hemoglobin: 10.4 g/dL — ABNORMAL LOW (ref 12.0–15.0)
Immature Granulocytes: 0 %
Lymphocytes Relative: 23 %
Lymphs Abs: 1.1 10*3/uL (ref 0.7–4.0)
MCH: 30.6 pg (ref 26.0–34.0)
MCHC: 32.7 g/dL (ref 30.0–36.0)
MCV: 93.5 fL (ref 80.0–100.0)
Monocytes Absolute: 0.5 10*3/uL (ref 0.1–1.0)
Monocytes Relative: 10 %
Neutro Abs: 3.1 10*3/uL (ref 1.7–7.7)
Neutrophils Relative %: 62 %
Platelets: 215 10*3/uL (ref 150–400)
RBC: 3.4 MIL/uL — ABNORMAL LOW (ref 3.87–5.11)
RDW: 13.6 % (ref 11.5–15.5)
WBC: 5 10*3/uL (ref 4.0–10.5)
nRBC: 0 % (ref 0.0–0.2)

## 2022-01-19 LAB — COMPREHENSIVE METABOLIC PANEL
ALT: 12 U/L (ref 0–44)
AST: 24 U/L (ref 15–41)
Albumin: 3.6 g/dL (ref 3.5–5.0)
Alkaline Phosphatase: 82 U/L (ref 38–126)
Anion gap: 7 (ref 5–15)
BUN: 12 mg/dL (ref 8–23)
CO2: 21 mmol/L — ABNORMAL LOW (ref 22–32)
Calcium: 8.8 mg/dL — ABNORMAL LOW (ref 8.9–10.3)
Chloride: 104 mmol/L (ref 98–111)
Creatinine, Ser: 0.94 mg/dL (ref 0.44–1.00)
GFR, Estimated: >60 mL/min (ref >60)
Glucose, Bld: 116 mg/dL — ABNORMAL HIGH (ref 70–99)
Potassium: 4.1 mmol/L (ref 3.5–5.1)
Sodium: 132 mmol/L — ABNORMAL LOW (ref 135–145)
Total Bilirubin: 0.6 mg/dL (ref 0.3–1.2)
Total Protein: 6.2 g/dL — ABNORMAL LOW (ref 6.5–8.1)

## 2022-01-19 MED ORDER — SODIUM CHLORIDE 0.9 % IV SOLN
175.0000 mg/m2 | Freq: Once | INTRAVENOUS | Status: AC
  Administered 2022-01-19: 246 mg via INTRAVENOUS
  Filled 2022-01-19: qty 41

## 2022-01-19 MED ORDER — SODIUM CHLORIDE 0.9 % IV SOLN
150.0000 mg | Freq: Once | INTRAVENOUS | Status: AC
  Administered 2022-01-19: 150 mg via INTRAVENOUS
  Filled 2022-01-19: qty 150

## 2022-01-19 MED ORDER — FAMOTIDINE IN NACL 20-0.9 MG/50ML-% IV SOLN
20.0000 mg | Freq: Once | INTRAVENOUS | Status: AC
  Administered 2022-01-19: 20 mg via INTRAVENOUS
  Filled 2022-01-19: qty 50

## 2022-01-19 MED ORDER — SODIUM CHLORIDE 0.9 % IV SOLN
510.0000 mg | INTRAVENOUS | Status: DC
  Administered 2022-01-19: 510 mg via INTRAVENOUS
  Filled 2022-01-19: qty 17

## 2022-01-19 MED ORDER — SODIUM CHLORIDE 0.9 % IV SOLN
Freq: Once | INTRAVENOUS | Status: AC
  Filled 2022-01-19: qty 250

## 2022-01-19 MED ORDER — PALONOSETRON HCL INJECTION 0.25 MG/5ML
0.2500 mg | Freq: Once | INTRAVENOUS | Status: AC
  Administered 2022-01-19: 0.25 mg via INTRAVENOUS
  Filled 2022-01-19: qty 5

## 2022-01-19 MED ORDER — SODIUM CHLORIDE 0.9 % IV SOLN
10.0000 mg | Freq: Once | INTRAVENOUS | Status: AC
  Administered 2022-01-19: 10 mg via INTRAVENOUS
  Filled 2022-01-19: qty 10

## 2022-01-19 MED ORDER — SODIUM CHLORIDE 0.9 % IV SOLN
288.0000 mg | Freq: Once | INTRAVENOUS | Status: AC
  Administered 2022-01-19: 290 mg via INTRAVENOUS
  Filled 2022-01-19: qty 29

## 2022-01-19 MED ORDER — CYANOCOBALAMIN 1000 MCG/ML IJ SOLN
1000.0000 ug | INTRAMUSCULAR | Status: DC
  Administered 2022-01-19: 1000 ug via INTRAMUSCULAR
  Filled 2022-01-19: qty 1

## 2022-01-19 MED ORDER — DIPHENHYDRAMINE HCL 50 MG/ML IJ SOLN
50.0000 mg | Freq: Once | INTRAMUSCULAR | Status: AC
  Administered 2022-01-19: 50 mg via INTRAVENOUS
  Filled 2022-01-19: qty 1

## 2022-01-19 MED ORDER — HEPARIN SOD (PORK) LOCK FLUSH 100 UNIT/ML IV SOLN
INTRAVENOUS | Status: AC
  Administered 2022-01-19: 500 [IU]
  Filled 2022-01-19: qty 5

## 2022-01-19 NOTE — Progress Notes (Signed)
Escorted to infusion for first chemotherapy infusion. Hand off provided to infusion RN. ?

## 2022-01-19 NOTE — Progress Notes (Signed)
? ? ? ?Hematology/Oncology Consult note ?[place]  ?[phone] [phone] ? ?Patient Care Team: ?Adin Hector, MD as PCP - General (Internal Medicine) ?Rockwell Germany, RN as Oncology Nurse Navigator ?Mauro Kaufmann, RN as Oncology Nurse Navigator ?Rolm Bookbinder, MD as Consulting Physician (General Surgery) ?Eppie Gibson, MD as Attending Physician (Radiation Oncology) ?Rubie Maid, MD as Consulting Physician (Obstetrics and Gynecology) ?Clent Jacks, RN as Oncology Nurse Navigator ?Sindy Guadeloupe, MD as Consulting Physician (Oncology)  ? ?Name of the patient: Elizabeth Friedman  ?[id number]  ?06-07-1941  ? ?Date of visit: 01/19/22 ? 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 ?Chief complaint/ Reason for visit-on treatment assessment prior to cycle 1 of CarboTaxol chemotherapy ? ?Heme/Onc history: Patient is a 82 year old female who was having pelvic pain symptoms for a month.  Initially she saw GI but given her history of pessary placement for 20 years she decided to see GYN.  She had an ultrasound pelvis which showed mixed echogenicity mass in the endometrial canal measuring 3.7 x 4.1 x 3.3 cm.  She was also having some vaginal spotting and brown discharge.  She underwent hysteroscopy with removal of endometrial mass and D&C which showed carcinosarcoma/malignant mixed m?llerian tumor. ?  ?CT chest abdomen and pelvis showed enhancing lesion in the anterior fundus but no other evidence of distant metastatic disease in chest abdomen and pelvis. ?  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  She was following up with Dr. Jana Hakim at [facility] previously.  She is not on any endocrine therapy for the same. ?  ?Patient underwent total laparoscopic hysterectomy with bilateral salpingo-oophorectomy and bilateral pelvic sentinel lymph node injection mapping and biopsies on  12/24/2021.  Final pathology showed a 3.9 cm carcinosarcoma.  Less than 50% myometrial invasion.  Uterine serosa not identified.  Malignant cells were present in the peritoneal/ascites fluid.  Lymphatic invasion not identified.  Margins negative.  2 sentinel lymph nodes on the right and left iliac were negative for malignancy.  No evidence of malignancy in the fallopian tubes.  Cystadenofibroma noted in the ovaries pT1a pN0 FIGO stage IA ?  ?Plan is to proceed with adjuvant CarboTaxol chemotherapy every 3 weeks x6 cycles if patient can tolerate it especially given positive pelvic washings.  Followed by vaginal brachytherapy ? ? ?Interval history-patient is doing well presently.  Bowel movements are regular and she denies any specific complaints at this time.  She has mild baseline neuropathy for which she is on gabapentin 200 mg twice daily ? ?ECOG PS- 1 ?Pain scale- 0 ? ? ?Review of systems- Review of Systems  ?Constitutional:  Negative for chills, fever, malaise/fatigue and weight loss.  ?HENT:  Negative for congestion, ear discharge and nosebleeds.   ?Eyes:  Negative for blurred vision.  ?Respiratory:  Negative for cough, hemoptysis, sputum production, shortness of breath and wheezing.   ?Cardiovascular:  Negative for chest pain, palpitations, orthopnea and claudication.  ?Gastrointestinal:  Negative for abdominal pain, blood in stool, constipation, diarrhea, heartburn, melena, nausea and vomiting.  ?Genitourinary:  Negative for dysuria, flank pain, frequency, hematuria and urgency.  ?Musculoskeletal:  Negative for back pain, joint pain and myalgias.  ?Skin:  Negative for rash.  ?Neurological:  Negative for dizziness, tingling, focal weakness, seizures, weakness and headaches.  ?Endo/Heme/Allergies:  Does not bruise/bleed easily.  ?Psychiatric/Behavioral:  Negative for depression and suicidal ideas. The patient does not have insomnia.    ? ? ?  No Known Allergies ? ? ?Past Medical History:  ?Diagnosis Date  ?  Allergic rhinitis   ? Anemia   ? Anginal pain ([place])   ? Arthritis   ? Atypical chest pain   ? Breast cancer ([place])   ? 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[place])   ? Ductal carcinoma in situ (DCIS) of right breast   ? Fibrocystic breast disease   ? GERD (gastroesophageal reflux disease)   ? Hearing aid worn   ? bilateral  ? History of hiatal hernia   ? Hyperlipidemia   ? Lung nodule   ? Menopause   ? Migraine   ? Migraine   ? optical migraines  ? Motion sickness   ? Plantar fasciitis   ? Prolapse of female pelvic organs   ? hodge pessary  ? Raynaud's disease   ? Sciatic leg pain   ? Sciatica   ? Skin cancer of face   ? Urinary retention with incomplete bladder emptying   ? Uterine carcinosarcoma ([place]) 11/2021  ? Vitreous detachment of both eyes   ? ? ? ?Past Surgical History:  ?Procedure Laterality Date  ? APPENDECTOMY  1951  ? BREAST LUMPECTOMY WITH RADIOACTIVE SEED LOCALIZATION Right 05/29/2020  ? Procedure: RIGHT BREAST LUMPECTOMY WITH RADIOACTIVE SEED LOCALIZATION;  Surgeon: Rolm Bookbinder, MD;  Location: [facility];  Service: General;  Laterality: Right;  ? CARDIAC CATHETERIZATION  2004  ? negative  ? CATARACT EXTRACTION W/PHACO Right 02/26/2021  ? Procedure: CATARACT EXTRACTION PHACO AND INTRAOCULAR LENS PLACEMENT (I[facility]) RIGHT 5.86 01:19.6 7.4%;  Surgeon: Leandrew Koyanagi, MD;  Location: [facility];  Service: Ophthalmology;  Laterality: Right;  ? CATARACT EXTRACTION W/PHACO Left 09/03/2021  ? Procedure: CATARACT EXTRACTION PHACO AND INTRAOCULAR LENS PLACEMENT ([place]) LEFT;  Surgeon: Leandrew Koyanagi, MD;  Location: [facility];  Service: Ophthalmology;  Laterality: Left;  9.61 ?01:15.9  ? COLONOSCOPY    ? 2000, 2006, 2012  ? COLONOSCOPY WITH PROPOFOL N/A 03/27/2016  ? Procedure: COLONOSCOPY WITH PROPOFOL;  Surgeon: Manya Silvas, MD;  Location[facility] ENDOSCOPY;  Service: Endoscopy;  Laterality: N/A;  ? COLONOSCOPY WITH PROPOFOL N/A 08/14/2020  ? Procedure: COLONOSCOPY WITH PROPOFOL;   Surgeon: Toledo, Benay Pike, MD;  Location: [facility] ENDOSCOPY;  Service: Gastroenterology;  Laterality: N/A;  ? CYSTOSCOPY N/A 12/24/2021  ? Procedure: CYSTOSCOPY;  Surgeon: Jaquita Folds, MD;  Location: [facility] ORS;  Service: Gynecology;  Laterality: N/A;  ? DILATATION & CURETTAGE/HYSTEROSCOPY WITH MYOSURE N/A 12/01/2021  ? Procedure: [place];  Surgeon: Rubie Maid, MD;  Location: [facility] ORS;  Service: Gynecology;  Laterality: N/A;  ? ESOPHAGOGASTRODUODENOSCOPY  08/1999  ? ESOPHAGOGASTRODUODENOSCOPY (EGD) WITH PROPOFOL N/A 08/14/2020  ? Procedure: ESOPHAGOGASTRODUODENOSCOPY (EGD) WITH PROPOFOL;  Surgeon: Toledo, Benay Pike, MD;  Location: [facility] ENDOSCOPY;  Service: Gastroenterology;  Laterality: N/A;  ? ESOPHAGOGASTRODUODENOSCOPY (EGD) WITH PROPOFOL N/A 03/21/2021  ? Procedure: ESOPHAGOGASTRODUODENOSCOPY (EGD) WITH PROPOFOL;  Surgeon: Toledo, Benay Pike, MD;  Location: [facility] ENDOSCOPY;  Service: Gastroenterology;  Laterality: N/A;  ? PORTA CATH INSERTION N/A 01/15/2022  ? Procedure: PORTA CATH INSERTION;  Surgeon: Algernon Huxley, MD;  Location: [facility] CV LAB;  Service: Cardiovascular;  Laterality: N/A;  ? SKIN CANCER EXCISION    ? face and neck  ? TOTAL LAPAROSCOPIC HYSTERECTOMY WITH BILATERAL SALPINGO OOPHORECTOMY Bilateral 12/24/2021  ? Procedure: TOTAL LAPAROSCOPIC HYSTERECTOMY WITH BILATERAL SALPINGO OOPHORECTOMY, PELVIC NODE BIOPSY,  PESSARY REMOVAL (PESSARY REMOVAL BY DR. Wannetta Sender;  Surgeon: Mellody Drown, MD;  Location: [facility] ORS;  Service: Gynecology;  Laterality:  Bilateral;  ? ? ?Social History  ? ?Socioeconomic History  ? Marital status: Widowed  ?  Spouse name: Not on file  ? Number of children: Not on file  ? Years of education: Not on file  ? Highest education level: Not on file  ?Occupational History  ? Not on file  ?Tobacco Use  ? Smoking status: Never  ? Smokeless tobacco: Never  ?Vaping Use  ? Vaping Use: Never used  ?Substance and Sexual Activity  ? Alcohol  use: No  ? Drug use: No  ? Sexual activity: Not Currently  ?  Birth control/protection: Post-menopausal  ?Other Topics Concern  ? Not on file  ?Social History Narrative  ? Lives alone  ? ?Social Determinants of Health

## 2022-01-19 NOTE — Patient Instructions (Signed)
[facility] CANCER CTR AT [place]  Discharge Instructions: ?Thank you for choosing [facility] to provide your oncology and hematology care.  ?If you have a lab appointment with the [facility], please go directly to the [facility] and check in at the registration area. ? ?Wear comfortable clothing and clothing appropriate for easy access to any Portacath or PICC line.  ? ?We strive to give you quality time with your provider. You may need to reschedule your appointment if you arrive late (15 or more minutes).  Arriving late affects you and other patients whose appointments are after yours.  Also, if you miss three or more appointments without notifying the office, you may be dismissed from the clinic at the provider?s discretion.    ?  ?For prescription refill requests, have your pharmacy contact our office and allow 72 hours for refills to be completed.   ? ?Today you received the following chemotherapy and/or immunotherapy agents: Taxol, Carboplatin,    ?  ?To help prevent nausea and vomiting after your treatment, we encourage you to take your nausea medication as directed. ? ?BELOW ARE SYMPTOMS THAT SHOULD BE REPORTED IMMEDIATELY: ?*FEVER GREATER THAN 100.4 F (38 ?C) OR HIGHER ?*CHILLS OR SWEATING ?*NAUSEA AND VOMITING THAT IS NOT CONTROLLED WITH YOUR NAUSEA MEDICATION ?*UNUSUAL SHORTNESS OF BREATH ?*UNUSUAL BRUISING OR BLEEDING ?*URINARY PROBLEMS (pain or burning when urinating, or frequent urination) ?*BOWEL PROBLEMS (unusual diarrhea, constipation, pain near the anus) ?TENDERNESS IN MOUTH AND THROAT WITH OR WITHOUT PRESENCE OF ULCERS (sore throat, sores in mouth, or a toothache) ?UNUSUAL RASH, SWELLING OR PAIN  ?UNUSUAL VAGINAL DISCHARGE OR ITCHING  ? ?Items with * indicate a potential emergency and should be followed up as soon as possible or go to the Emergency Department if any problems should occur. ? ?Please show the CHEMOTHERAPY ALERT CARD or IMMUNOTHERAPY ALERT CARD at  check-in to the Emergency Department and triage nurse. ? ?Should you have questions after your visit or need to cancel or reschedule your appointment, please contact [facility] CANCER [place] AT [place]  [phone] and follow the prompts.  Office hours are 8:00 a.m. to 4:30 p.m. Monday - Friday. Please note that voicemails left after 4:00 p.m. may not be returned until the following business day.  We are closed weekends and major holidays. You have access to a nurse at all times for urgent questions. Please call the main number to the clinic [phone] and follow the prompts. ? ?For any non-urgent questions, you may also contact your provider using MyChart. We now offer e-Visits for anyone 49 and older to request care online for non-urgent symptoms. For details visit [email or web address]. ?  ?Also download the MyChart app! Go to the app store, search "MyChart", open the app, select [facility], and log in with your MyChart username and password. ? ?Due to Covid, a mask is required upon entering the hospital/clinic. If you do not have a mask, one will be given to you upon arrival. For doctor visits, patients may have 1 support person aged 79 or older with them. For treatment visits, patients cannot have anyone with them due to current Covid guidelines and our immunocompromised population. Vitamin B12 Injection ?What is this medication? ?Vitamin B12 (VAHY tuh min B12) prevents and treats low vitamin B12 levels in your body. It is used in people who do not get enough vitamin B12 from their diet or when their digestive tract does not absorb enough. Vitamin B12 plays an important role in maintaining the  health of your nervous system and red blood cells. ?This medicine may be used for other purposes; ask your health care provider or pharmacist if you have questions. ?COMMON BRAND NAME(S): B-12 Compliance Kit, B-12 Injection Kit, Cyomin, Dodex, LA-12, Nutri-Twelve, Physicians EZ Use B-12, Primabalt ?What  should I tell my care team before I take this medication? ?They need to know if you have any of these conditions: ?Kidney disease ?Leber's disease ?Megaloblastic anemia ?An unusual or allergic reaction to cyanocobalamin, cobalt, other medications, foods, dyes, or preservatives ?Pregnant or trying to get pregnant ?Breast-feeding ?How should I use this medication? ?This medication is injected into a muscle or deeply under the skin. It is usually given in a clinic or care team's office. However, your care team may teach you how to inject yourself. Follow all instructions. ?Talk to your care team about the use of this medication in children. Special care may be needed. ?Overdosage: If you think you have taken too much of this medicine contact a poison control center or emergency room at once. ?NOTE: This medicine is only for you. Do not share this medicine with others. ?What if I miss a dose? ?If you are given your dose at a clinic or care team's office, call to reschedule your appointment. If you give your own injections, and you miss a dose, take it as soon as you can. If it is almost time for your next dose, take only that dose. Do not take double or extra doses. ?What may interact with this medication? ?Colchicine ?Heavy alcohol intake ?This list may not describe all possible interactions. Give your health care provider a list of all the medicines, herbs, non-prescription drugs, or dietary supplements you use. Also tell them if you smoke, drink alcohol, or use illegal drugs. Some items may interact with your medicine. ?What should I watch for while using this medication? ?Visit your care team regularly. You may need blood work done while you are taking this medication. ?You may need to follow a special diet. Talk to your care team. Limit your alcohol intake and avoid smoking to get the best benefit. ?What side effects may I notice from receiving this medication? ?Side effects that you should report to your care team  as soon as possible: ?Allergic reactions--skin rash, itching, hives, swelling of the face, lips, tongue, or throat ?Swelling of the ankles, hands, or feet ?Trouble breathing ?Side effects that usually do not require medical attention (report to your care team if they continue or are bothersome): ?Diarrhea ?This list may not describe all possible side effects. Call your doctor for medical advice about side effects. You may report side effects to FDA at 1-800-FDA-1088. ?Where should I keep my medication? ?Keep out of the reach of children. ?Store at room temperature between 15 and 30 degrees C (59 and 85 degrees F). Protect from light. Throw away any unused medication after the expiration date. ?NOTE: This sheet is a summary. It may not cover all possible information. If you have questions about this medicine, talk to your doctor, pharmacist, or health care provider. ?? 2022 Elsevier/Gold Standard (2020-12-25 00:00:00) ?Ferumoxytol Injection ?What is this medication? ?FERUMOXYTOL (FER ue MOX i tol) treats low levels of iron in your body (iron deficiency anemia). Iron is a mineral that plays an important role in making red blood cells, which carry oxygen from your lungs to the rest of your body. ?This medicine may be used for other purposes; ask your health care provider or pharmacist if you have  questions. ?COMMON BRAND NAME(S): Feraheme ?What should I tell my care team before I take this medication? ?They need to know if you have any of these conditions: ?Anemia not caused by low iron levels ?High levels of iron in the blood ?Magnetic resonance imaging (MRI) test scheduled ?An unusual or allergic reaction to iron, other medications, foods, dyes, or preservatives ?Pregnant or trying to get pregnant ?Breast-feeding ?How should I use this medication? ?This medication is for injection into a vein. It is given in a hospital or clinic setting. ?Talk to your care team the use of this medication in children. Special care may  be needed. ?Overdosage: If you think you have taken too much of this medicine contact a poison control center or emergency room at once. ?NOTE: This medicine is only for you. Do not share this medicine w

## 2022-01-19 NOTE — Telephone Encounter (Signed)
Esmond Plants Key: [id number] - Rx #: [id number] ?PA SUBMITTED FOR ZOFRAN ?

## 2022-01-21 ENCOUNTER — Inpatient Hospital Stay: Payer: PPO

## 2022-01-21 DIAGNOSIS — C541 Malignant neoplasm of endometrium: Secondary | ICD-10-CM

## 2022-01-21 DIAGNOSIS — Z5111 Encounter for antineoplastic chemotherapy: Secondary | ICD-10-CM | POA: Diagnosis not present

## 2022-01-21 MED ORDER — PEGFILGRASTIM-CBQV 6 MG/0.6ML ~~LOC~~ SOSY
6.0000 mg | PREFILLED_SYRINGE | Freq: Once | SUBCUTANEOUS | Status: AC
  Administered 2022-01-21: 6 mg via SUBCUTANEOUS
  Filled 2022-01-21: qty 0.6

## 2022-01-22 ENCOUNTER — Telehealth: Payer: Self-pay

## 2022-01-22 ENCOUNTER — Telehealth: Payer: Self-pay | Admitting: *Deleted

## 2022-01-22 ENCOUNTER — Inpatient Hospital Stay (HOSPITAL_BASED_OUTPATIENT_CLINIC_OR_DEPARTMENT_OTHER): Payer: PPO | Admitting: Nurse Practitioner

## 2022-01-22 ENCOUNTER — Other Ambulatory Visit: Payer: Self-pay

## 2022-01-22 VITALS — BP 136/67 | HR 79 | Temp 98.5°F | Resp 16 | Wt 100.4 lb

## 2022-01-22 DIAGNOSIS — C541 Malignant neoplasm of endometrium: Secondary | ICD-10-CM

## 2022-01-22 DIAGNOSIS — G8918 Other acute postprocedural pain: Secondary | ICD-10-CM

## 2022-01-22 DIAGNOSIS — Z5111 Encounter for antineoplastic chemotherapy: Secondary | ICD-10-CM | POA: Diagnosis not present

## 2022-01-22 LAB — URINALYSIS, COMPLETE (UACMP) WITH MICROSCOPIC
Bacteria, UA: NONE SEEN
Bilirubin Urine: NEGATIVE
Glucose, UA: NEGATIVE mg/dL
Ketones, ur: NEGATIVE mg/dL
Leukocytes,Ua: NEGATIVE
Nitrite: NEGATIVE
Protein, ur: NEGATIVE mg/dL
Specific Gravity, Urine: 1.006 (ref 1.005–1.030)
Squamous Epithelial / HPF: NONE SEEN (ref 0–5)
pH: 7 (ref 5.0–8.0)

## 2022-01-22 NOTE — Progress Notes (Signed)
? ?Symptom Management Clinic ? ?[place] [facility] [place] ?[address] ?[place], [place] [address] ?[phone] (phone) ?[phone] (fax) ? ?Patient Care Team: ?Adin Hector, MD as PCP - General (Internal Medicine) ?Rockwell Germany, RN as Oncology Nurse Navigator ?Mauro Kaufmann, RN as Oncology Nurse Navigator ?Rolm Bookbinder, MD as Consulting Physician (General Surgery) ?Eppie Gibson, MD as Attending Physician (Radiation Oncology) ?Rubie Maid, MD as Consulting Physician (Obstetrics and Gynecology) ?Clent Jacks, RN as Oncology Nurse Navigator ?Sindy Guadeloupe, MD as Consulting Physician (Oncology)  ? ?Name of the patient: Elizabeth Friedman  ?[id number]  ?[birth year]  ? ?Date of visit: 01/22/22 ? ?Diagnosis-uterine carcinosarcoma ? ?Chief complaint/ Reason for visit-vaginal bleeding and pelvic pain ? ?Heme/Onc history:  ?Oncology History  ?Endometrial cancer ([place])  ?12/24/2021 Initial Diagnosis  ? Endometrial cancer [facility]) ?  ?01/13/2022 Cancer Staging  ? Staging form: Corpus Uteri - Carcinoma and Carcinosarcoma, AJCC 8th Edition ?- Clinical stage from 01/13/2022: FIGO Stage IA (cT1a, cN0, cM0) - Signed by Sindy Guadeloupe, MD on 01/13/2022 ?Histologic grade (G): G3 ?Histologic grading system: 3 grade system ? ?  ?01/19/2022 -  Chemotherapy  ? Patient is on Treatment Plan : UTERINE Carboplatin AUC 5 / Paclitaxel q21d  ?   ? ? ?Interval history-patient is 81 year old female with above history of endometrial carcinosarcoma s/p total laparoscopic hysterectomy with bilateral salpingo-oophorectomy and bilateral pelvic sentinel node injection and mapping with biopsies on 12/24/2021.  At that same time, patient underwent laparoscopic uterosacral suspension and perineal repair with Dr. Lucile Shutters.  Adjuvant treatment was recommended and patient started CarboTaxol on 01/19/2022.  She complains of some vaginal spotting and  abdominal pain since surgery. Localizes to lower pelvis radiates laterally. Not worsening. She has been controlling her postoperative pain with Tylenol but recently had to take tramadol. She was concerned for complication and presents to clinic for evaluation.  Since surgery she is been maintaining a soft, mostly liquid bowel movements by taking milk of magnesia at least nightly.  Declines constipation fullness.  No burning with urination. ? ?Review of systems- Review of Systems  ?Constitutional:  Positive for malaise/fatigue. Negative for chills, fever and weight loss.  ?HENT:  Negative for hearing loss, nosebleeds, sore throat and tinnitus.   ?Eyes:  Negative for blurred vision and double vision.  ?Respiratory:  Negative for cough, hemoptysis, shortness of breath and wheezing.   ?Cardiovascular:  Negative for chest pain, palpitations and leg swelling.  ?Gastrointestinal:  Negative for abdominal pain, blood in stool, constipation, diarrhea, melena, nausea and vomiting.  ?     Pelvic pain  ?Genitourinary:  Negative for dysuria and urgency.  ?Musculoskeletal:  Negative for back pain, falls, joint pain and myalgias.  ?Skin:  Negative for itching and rash.  ?Neurological:  Negative for dizziness, tingling, sensory change, loss of consciousness, weakness and headaches.  ?Endo/Heme/Allergies:  Negative for environmental allergies. Does not bruise/bleed easily.  ?Psychiatric/Behavioral:  Negative for depression. The patient is not nervous/anxious and does not have insomnia.    ? ? ?No Known Allergies ? ?Past Medical History:  ?Diagnosis Date  ? Allergic rhinitis   ? Anemia   ? Anginal pain ([place])   ? Arthritis   ? Atypical chest pain   ? Breast cancer ([place])   ? 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[place])   ? Ductal carcinoma in situ (DCIS) of right breast   ? Fibrocystic breast disease   ? GERD (gastroesophageal reflux  disease)   ? Hearing aid worn   ? bilateral  ? History of hiatal hernia   ? Hyperlipidemia   ? Lung nodule   ? Menopause   ?  Migraine   ? Migraine   ? optical migraines  ? Motion sickness   ? Plantar fasciitis   ? Prolapse of female pelvic organs   ? hodge pessary  ? Raynaud's disease   ? Sciatic leg pain   ? Sciatica   ? Skin cancer of face   ? Urinary retention with incomplete bladder emptying   ? Uterine carcinosarcoma ([place]) 11/2021  ? Vitreous detachment of both eyes   ? ? ?Past Surgical History:  ?Procedure Laterality Date  ? APPENDECTOMY  1951  ? BREAST LUMPECTOMY WITH RADIOACTIVE SEED LOCALIZATION Right 05/29/2020  ? Procedure: RIGHT BREAST LUMPECTOMY WITH RADIOACTIVE SEED LOCALIZATION;  Surgeon: Rolm Bookbinder, MD;  Location: [place];  Service: General;  Laterality: Right;  ? CARDIAC CATHETERIZATION  2004  ? negative  ? CATARACT EXTRACTION W/PHACO Right 02/26/2021  ? Procedure: CATARACT EXTRACTION PHACO AND INTRAOCULAR LENS PLACEMENT (IOC) RIGHT 5.86 01:19.6 7.4%;  Surgeon: Leandrew Koyanagi, MD;  Location: [place];  Service: Ophthalmology;  Laterality: Right;  ? CATARACT EXTRACTION W/PHACO Left 09/03/2021  ? Procedure: CATARACT EXTRACTION PHACO AND INTRAOCULAR LENS PLACEMENT ([place]) LEFT;  Surgeon: Leandrew Koyanagi, MD;  Location: [place];  Service: Ophthalmology;  Laterality: Left;  9.61 ?01:15.9  ? COLONOSCOPY    ? 2000, 2006, 2012  ? COLONOSCOPY WITH PROPOFOL N/A 03/27/2016  ? Procedure: COLONOSCOPY WITH PROPOFOL;  Surgeon: Manya Silvas, MD;  Location: [facility] ENDOSCOPY;  Service: Endoscopy;  Laterality: N/A;  ? COLONOSCOPY WITH PROPOFOL N/A 08/14/2020  ? Procedure: COLONOSCOPY WITH PROPOFOL;  Surgeon: Toledo, Benay Pike, MD;  Location: [facility] ENDOSCOPY;  Service: Gastroenterology;  Laterality: N/A;  ? CYSTOSCOPY N/A 12/24/2021  ? Procedure: CYSTOSCOPY;  Surgeon: Jaquita Folds, MD;  Location: [facility] ORS;  Service: Gynecology;  Laterality: N/A;  ? DILATATION & CURETTAGE/HYSTEROSCOPY WITH MYOSURE N/A 12/01/2021  ? Procedure: [place];   Surgeon: Rubie Maid, MD;  Location: [facility] ORS;  Service: Gynecology;  Laterality: N/A;  ? ESOPHAGOGASTRODUODENOSCOPY  08/1999  ? ESOPHAGOGASTRODUODENOSCOPY (EGD) WITH PROPOFOL N/A 08/14/2020  ? Procedure: ESOPHAGOGASTRODUODENOSCOPY (EGD) WITH PROPOFOL;  Surgeon: Toledo, Benay Pike, MD;  Location: [facility] ENDOSCOPY;  Service: Gastroenterology;  Laterality: N/A;  ? ESOPHAGOGASTRODUODENOSCOPY (EGD) WITH PROPOFOL N/A 03/21/2021  ? Procedure: ESOPHAGOGASTRODUODENOSCOPY (EGD) WITH PROPOFOL;  Surgeon: Toledo, Benay Pike, MD;  Location: [facility] ENDOSCOPY;  Service: Gastroenterology;  Laterality: N/A;  ? PORTA CATH INSERTION N/A 01/15/2022  ? Procedure: PORTA CATH INSERTION;  Surgeon: Algernon Huxley, MD;  Location: [place] CV LAB;  Service: Cardiovascular;  Laterality: N/A;  ? SKIN CANCER EXCISION    ? face and neck  ? TOTAL LAPAROSCOPIC HYSTERECTOMY WITH BILATERAL SALPINGO OOPHORECTOMY Bilateral 12/24/2021  ? Procedure: TOTAL LAPAROSCOPIC HYSTERECTOMY WITH BILATERAL SALPINGO OOPHORECTOMY, PELVIC NODE BIOPSY,  PESSARY REMOVAL (PESSARY REMOVAL BY DR. Wannetta Sender;  Surgeon: Mellody Drown, MD;  Location: [facility] OR[facility];  Service: Gynecology;  Laterality: Bilateral;  ? ? ?Social History  ? ?Socioeconomic History  ? Marital status: Widowed  ?  Spouse name: Not on file  ? Number of children: Not on file  ? Years of education: Not on file  ? Highest education level: Not on file  ?Occupational History  ? Not on file  ?Tobacco Use  ? Smoking status: Never  ? Smokeless tobacco: Never  ?Vaping  Use  ? Vaping Use: Never used  ?Substance and Sexual Activity  ? Alcohol use: No  ? Drug use: No  ? Sexual activity: Not Currently  ?  Birth control/protection: Post-menopausal  ?Other Topics Concern  ? Not on file  ?Social History Narrative  ? Lives alone  ? ?Social Determinants of Health  ? ?Financial Resource Strain: Not on file  ?Food Insecurity: Not on file  ?Transportation Needs: Not on file  ?Physical Activity: Not on file  ?Stress: Not on file   ?Social Connections: Not on file  ?Intimate Partner Violence: Not on file  ? ? ?Family History  ?Problem Relation Age of Onset  ? Breast cancer Mother   ? Heart disease Father   ? Colon cancer Brother   ? Diabete

## 2022-01-22 NOTE — Telephone Encounter (Signed)
Patient called reporting that she had a bladder tacking on 3/1, She has since had chemotherapy and is now having pelvic discomfort and pain. She states she has been using stool softeners to prevent straining with bowel movement since her surgery and reports that she pass some blood yesterday. She called her surgeon who advised that she contact us as her chemotherapy may have caused her symptoms. Please advise ?

## 2022-01-22 NOTE — Telephone Encounter (Signed)
Patient will be seeing [name], NP this afternoon ?

## 2022-01-22 NOTE — Telephone Encounter (Signed)
Received call from Elizabeth Friedman. She is reporting increasing pelvic pain since her start of chemotherapy. She remains with tenderness at her episiotomy site which was made to remove her pessary. She reports some light bleeding. She is unsure if this is urinary, vaginal, or from her episiotomy site. She has been provided an appointment today with symptom management at 1500. ?

## 2022-01-22 NOTE — Telephone Encounter (Signed)
Can you please reach out to her and see if she is constipated?

## 2022-01-23 ENCOUNTER — Encounter: Payer: Self-pay | Admitting: Oncology

## 2022-01-23 ENCOUNTER — Encounter: Payer: Self-pay | Admitting: Nurse Practitioner

## 2022-01-23 DIAGNOSIS — C541 Malignant neoplasm of endometrium: Secondary | ICD-10-CM | POA: Insufficient documentation

## 2022-01-28 ENCOUNTER — Inpatient Hospital Stay: Payer: PPO | Attending: Obstetrics and Gynecology | Admitting: Obstetrics and Gynecology

## 2022-01-28 ENCOUNTER — Inpatient Hospital Stay: Payer: PPO | Admitting: Hospice and Palliative Medicine

## 2022-01-28 ENCOUNTER — Other Ambulatory Visit: Payer: Self-pay | Admitting: *Deleted

## 2022-01-28 VITALS — BP 158/64 | HR 68 | Temp 98.7°F | Resp 20 | Wt 101.1 lb

## 2022-01-28 DIAGNOSIS — Z5189 Encounter for other specified aftercare: Secondary | ICD-10-CM | POA: Insufficient documentation

## 2022-01-28 DIAGNOSIS — E538 Deficiency of other specified B group vitamins: Secondary | ICD-10-CM | POA: Insufficient documentation

## 2022-01-28 DIAGNOSIS — Z5111 Encounter for antineoplastic chemotherapy: Secondary | ICD-10-CM | POA: Insufficient documentation

## 2022-01-28 DIAGNOSIS — C541 Malignant neoplasm of endometrium: Secondary | ICD-10-CM | POA: Insufficient documentation

## 2022-01-28 DIAGNOSIS — D649 Anemia, unspecified: Secondary | ICD-10-CM | POA: Insufficient documentation

## 2022-01-28 MED ORDER — GABAPENTIN 300 MG PO CAPS: 300.0000 mg | ORAL_CAPSULE | Freq: Two times a day (BID) | ORAL | 0 refills | Status: DC

## 2022-01-28 NOTE — Progress Notes (Signed)
Gynecologic Oncology Interval Visit  ? ?Referring Provider: Dr. Marcelline Mates ? ?Chief Complaint: Carcinosarcoma ? ?Subjective:  ?Elizabeth Friedman is a 81 y.o. 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  ? ?She is status post cycle 1 on 01/19/2022.  His been seen in the interim by myself for pelvic pain and spotting. Symptoms thought to be secondary to ongoing post-operativ ehealing. Today she presents for post-op exam.  ? ?Has existing neuropathy in legs and worse after one cycle of chemotherapy.  ? ?Gynecologic Oncology History  ?Elizabeth Friedman is a pleasant female who is seen in consultation from Dr. Marcelline Mates for lower pelvic pain x 1 month and uterine carcinosarcoma.  ? ?US Pelvis  ?The uterus is anteverted and measures 9.6 X 7.1 X 5.5 cm. ?Echo texture is heterogenous with evidence of focal mass in the Endometrium. ?  ?The Endometrium measures 35 mm.  There is an echogenic,  mixed echogenicity mass in the endometrial canal ,measuring: 3.7 x 4.1 x 3.3 cm; This is hyperemic and has a small amount of fluid surrounding it within the endometrial cavity. ?  ?Right Ovary is not visualized. ?Left Ovary measures 2.8 x  2.7 x 1.5 cm. It is normal in appearance. ?Survey of the adnexae demonstrates no adnexal masses. ?There is no free fluid in the cul de sac. ? ?She began having vaginal spotting and brown discharge. Abdominal pain increased.  ? ?Underwent hysteroscopy with removal of endometrial mass and D&C.  ? ?DIAGNOSIS: ?A. ENDOMETRIUM; CURETTAGE: ?- CARCINOSARCOMA / MALIGNANT MIXED MULLERIAN TUMOR. ?  ?B. ENDOMETRIAL MASS; CURETTAGE: ?- CARCINOSARCOMA / MALIGNANT MIXED MULLERIAN TUMOR. ? ?MMRp on IHC ?ER/PR+  ?P53 wild type ? ?She had total laparoscopic hysterectomy with bilateral salpingo-oophorectomy and bilateral pelvic sentinel lymph node injection and mapping with biopsies on 12/24/2021. Additionally, underwent concurrent laproscopic uterosacral suspension and perineal repair with Dr.  Lucile Shutters for hx of Gehrung pessary/permanent pessary (20 years). Case was discussed at tumor board and adjuvant carbo-taxol x 6 cycles was recommended.   ? ?DIAGNOSIS:  ?A. SENTINEL LYMPH NODE, LEFT EXTERNAL ILIAC; EXCISIONAL BIOPSY:  ?- ONE LYMPH NODE, NEGATIVE FOR MALIGNANCY (0/1).  ? ?B. SENTINEL LYMPH NODE, RIGHT EXTERNAL ILIAC; EXCISIONAL BIOPSY:  ?- ONE LYMPH NODE, NEGATIVE FOR MALIGNANCY (0/1).  ? ?C. UTERUS AND CERVIX WITH BILATERAL FALLOPIAN TUBES AND OVARIES; TOTAL  ?HYSTERECTOMY WITH BILATERAL SALPINGO-OOPHORECTOMY:  ?- ENDOMETRIUM:  ?     - CARCINOSARCOMA / MALIGNANT MIXED MULLERIAN TUMOR.  ?     - SEE CANCER SUMMARY BELOW.  ?- MYOMETRIUM:  ?     - LEIOMYOMA.  ?- UTERINE SEROSA:  ?     - ENDOMETRIOSIS.  ?- FALLOPIAN TUBES:  ?     - NO SIGNIFICANT PATHOLOGIC ALTERATION.  ?- OVARIES:  ?     - CYSTADENOFIBROMA (BILATERAL).  ? ?CASE SUMMARY: (ENDOMETRIUM)  ?Standard(s): AJCC-UICC 8, FIGO Cancer Report 2018  ? ?SPECIMEN  ?Procedure: Total hysterectomy with bilateral salpingo-oophorectomy  ? ?TUMOR  ?Tumor Size: Greatest dimension: 3.9 cm  ?Histologic Type: Carcinosarcoma  ?Histologic Grade: Not applicable  ?Myometrial Invasion: Present, less than 50%  ?Uterine Serosa Involvement: Not identified  ?Cervical Stromal Involvement: Not identified  ?Other Tissue/Organ Involvement: Not identified  ?Peritoneal/Ascitic Fluid: Malignant cells present  ?Lymphatic and/or Vascular Invasion: Not identified  ? ?MARGINS  ?Margin Status: All margins negative for invasive carcinoma  ? ?REGIONAL LYMPH NODES  ?Regional Lymph Node Status: Regional lymph nodes present, all regional  ?lymph nodes negative for tumor  ? ?Lymph  nodes examined:  ?     Total number of pelvic nodes examined (sentinel and non-sentinel):  ?2  ?     Number of pelvic sentinel nodes examined: 2  ?     Total number of para-aortic nodes examined (sentinel and  ?non-sentinel): 0  ?     Number of para-aortic sentinel nodes examined: 0  ? ?DISTANT  METASTASIS  ?Distant Site(s) Involved, if applicable: Not applicable  ? ?PATHOLOGIC STAGE CLASSIFICATION (pTNM, AJCC 8th Edition): pT pN / FIGO  ?Modified Classification: Not applicable  ?pT 1a  ?T Suffix: Not applicable  ?Regional Lymph Nodes Modifier: sn  ?pN0  ?pM - Not applicable  ? ?FIGO Stage (2018 FIGO Cancer Report): IA  ? ?DIAGNOSIS:  ?A. PELVIC WASHINGS:  ?- DIAGNOSTIC OF MALIGNANCY.  ?- SEE COMMENT.  ? ? ?PMH significant for DCIS of right breast s/p lumpectomy and radiation in 2021. Patient previously followed by Dr. Jana Hakim at [facility] but has transferred care to Dr. Rogue Bussing at [facility] and next appointment in May 2023.  ? ?She complains of persistent of GERD-like symptoms and epigastric discomfort since her hysteroscopy. Incomplete emptying of her bladder. She wears a pessary that has not been removed in years.  ? ?Her PCP recommended a pulmonary consultation given the CT pulmonary findings.  ? ?She had an abnormal EKG on 11/28/2021 that showed PVS, nonspecific ST and T wave abnormalities.  ? ?Problem List: ?Patient Active Problem List  ? Diagnosis Date Noted  ? 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[place]) 01/23/2022  ? Postoperative state 12/24/2021  ? Endometrial cancer ([place])   ? Lung granuloma ([place]) 12/10/2021  ? B12 deficiency 09/25/2021  ? History of ductal carcinoma in situ (DCIS) of breast 09/10/2020  ? Iron deficiency anemia 09/10/2020  ? Ductal carcinoma in situ (DCIS) of right breast 05/17/2020  ? Bilateral carotid artery stenosis 02/06/2020  ? Visual changes 01/12/2020  ? Carotid stenosis 01/11/2020  ? Migraine aura without headache 03/07/2019  ? Complete tear of left rotator cuff 03/10/2017  ? Pessary maintenance 12/18/2016  ? Rectocele 12/18/2016  ? Midline cystocele 12/18/2016  ? Menopausal syndrome on hormone replacement therapy 12/18/2016  ? Allergic rhinitis 12/17/2015  ? Atypical chest pain 12/17/2015  ? Bloodgood disease 12/17/2015  ? Acid reflux 12/17/2015  ? HLD (hyperlipidemia)  12/17/2015  ? Lung mass 12/17/2015  ? Uterine prolapse 12/17/2015  ? CA of skin 12/17/2015  ? Post menopausal syndrome 01/14/2015  ? Incomplete bladder emptying 01/14/2015  ? Neuralgia neuritis, sciatic nerve 01/14/2015  ? H/O malignant neoplasm of skin 09/17/2014  ? ? ?Past Medical History: ?Past Medical History:  ?Diagnosis Date  ? Allergic rhinitis   ? Anemia   ? Anginal pain ([place])   ? Arthritis   ? Atypical chest pain   ? Breast cancer ([place])   ? 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[place])   ? Ductal carcinoma in situ (DCIS) of right breast   ? Fibrocystic breast disease   ? GERD (gastroesophageal reflux disease)   ? Hearing aid worn   ? bilateral  ? History of hiatal hernia   ? Hyperlipidemia   ? Lung nodule   ? Menopause   ? Migraine   ? Migraine   ? optical migraines  ? Motion sickness   ? Plantar fasciitis   ? Prolapse of female pelvic organs   ? hodge pessary  ? Raynaud's disease   ? Sciatic leg pain   ? Sciatica   ? Skin cancer of face   ?  Urinary retention with incomplete bladder emptying   ? Uterine carcinosarcoma ([place]) 11/2021  ? Vitreous detachment of both eyes   ? ? ?Past Surgical History: ?Past Surgical History:  ?Procedure Laterality Date  ? APPENDECTOMY  1951  ? BREAST LUMPECTOMY WITH RADIOACTIVE SEED LOCALIZATION Right 05/29/2020  ? Procedure: RIGHT BREAST LUMPECTOMY WITH RADIOACTIVE SEED LOCALIZATION;  Surgeon: Rolm Bookbinder, MD;  Location: [place];  Service: General;  Laterality: Right;  ? CARDIAC CATHETERIZATION  2004  ? negative  ? CATARACT EXTRACTION W/PHACO Right 02/26/2021  ? Procedure: CATARACT EXTRACTION PHACO AND INTRAOCULAR LENS PLACEMENT (IOC) RIGHT 5.86 01:19.6 7.4%;  Surgeon: Leandrew Koyanagi, MD;  Location: [place];  Service: Ophthalmology;  Laterality: Right;  ? CATARACT EXTRACTION W/PHACO Left 09/03/2021  ? Procedure: CATARACT EXTRACTION PHACO AND INTRAOCULAR LENS PLACEMENT ([place]) LEFT;  Surgeon: Leandrew Koyanagi, MD;  Location: [place];  Service:  Ophthalmology;  Laterality: Left;  9.61 ?01:15.9  ? COLONOSCOPY    ? 2000, 2006, 2012  ? COLONOSCOPY WITH PROPOFOL N/A 03/27/2016  ? Procedure: COLONOSCOPY WITH PROPOFOL;  Surgeon: Manya Silvas, MD;  Location: [facility]OS[facility]

## 2022-01-28 NOTE — Progress Notes (Signed)
Multidisciplinary Oncology Council Documentation ? ?Elizabeth Friedman was presented by our [facility] on 01/28/2022, which included representatives from:  ?Palliative Care ?Dietitian  ?Physical/Occupational Therapist ?Nurse Navigator ?Genetics ?Speech Therapist ?Social work ?Survivorship RN ?Financial Navigator ?Research RN ? ? ?Elizabeth Friedman currently presents with history of endometrial cancer. ? ?We reviewed previous medical and familial history, history of present illness, and recent lab results along with all available histopathologic and imaging studies. The [place] considered available treatment options and made the following recommendations/referrals: ? ?- Nutrition ?- Genetics ? ?The MOC is a meeting of clinicians from various specialty areas who evaluate and discuss patients for whom a multidisciplinary approach is being considered. Final determinations in the plan of care are those of the provider(s).  ? ?Today's extended care, comprehensive team conference, Elizabeth Friedman was not present for the discussion and was not examined.   ?

## 2022-01-28 NOTE — Progress Notes (Signed)
gab

## 2022-01-29 ENCOUNTER — Inpatient Hospital Stay: Payer: PPO

## 2022-01-29 ENCOUNTER — Inpatient Hospital Stay (HOSPITAL_BASED_OUTPATIENT_CLINIC_OR_DEPARTMENT_OTHER): Payer: PPO | Admitting: Medical Oncology

## 2022-01-29 ENCOUNTER — Encounter: Payer: Self-pay | Admitting: Medical Oncology

## 2022-01-29 VITALS — BP 129/68 | HR 73 | Temp 97.0°F | Resp 16 | Wt 99.2 lb

## 2022-01-29 DIAGNOSIS — Z5189 Encounter for other specified aftercare: Secondary | ICD-10-CM | POA: Diagnosis not present

## 2022-01-29 DIAGNOSIS — C541 Malignant neoplasm of endometrium: Secondary | ICD-10-CM

## 2022-01-29 DIAGNOSIS — E538 Deficiency of other specified B group vitamins: Secondary | ICD-10-CM

## 2022-01-29 DIAGNOSIS — Z5111 Encounter for antineoplastic chemotherapy: Secondary | ICD-10-CM | POA: Diagnosis not present

## 2022-01-29 DIAGNOSIS — D649 Anemia, unspecified: Secondary | ICD-10-CM | POA: Diagnosis not present

## 2022-01-29 LAB — CBC WITH DIFFERENTIAL/PLATELET
Abs Immature Granulocytes: 4.14 10*3/uL — ABNORMAL HIGH (ref 0.00–0.07)
Basophils Absolute: 0 10*3/uL (ref 0.0–0.1)
Basophils Relative: 0 %
Eosinophils Absolute: 0.2 10*3/uL (ref 0.0–0.5)
Eosinophils Relative: 0 %
HCT: 31.8 % — ABNORMAL LOW (ref 36.0–46.0)
Hemoglobin: 10.3 g/dL — ABNORMAL LOW (ref 12.0–15.0)
Immature Granulocytes: 9 %
Lymphocytes Relative: 4 %
Lymphs Abs: 1.9 10*3/uL (ref 0.7–4.0)
MCH: 30.5 pg (ref 26.0–34.0)
MCHC: 32.4 g/dL (ref 30.0–36.0)
MCV: 94.1 fL (ref 80.0–100.0)
Monocytes Absolute: 2.8 10*3/uL — ABNORMAL HIGH (ref 0.1–1.0)
Monocytes Relative: 6 %
Neutro Abs: 35.4 10*3/uL — ABNORMAL HIGH (ref 1.7–7.7)
Neutrophils Relative %: 81 %
Platelets: 169 10*3/uL (ref 150–400)
RBC: 3.38 MIL/uL — ABNORMAL LOW (ref 3.87–5.11)
RDW: 14.2 % (ref 11.5–15.5)
Smear Review: NORMAL
WBC: 44.5 10*3/uL — ABNORMAL HIGH (ref 4.0–10.5)
nRBC: 0 % (ref 0.0–0.2)

## 2022-01-29 LAB — BASIC METABOLIC PANEL
Anion gap: 8 (ref 5–15)
BUN: 9 mg/dL (ref 8–23)
CO2: 21 mmol/L — ABNORMAL LOW (ref 22–32)
Calcium: 8.9 mg/dL (ref 8.9–10.3)
Chloride: 103 mmol/L (ref 98–111)
Creatinine, Ser: 0.95 mg/dL (ref 0.44–1.00)
GFR, Estimated: >60 mL/min (ref >60)
Glucose, Bld: 133 mg/dL — ABNORMAL HIGH (ref 70–99)
Potassium: 4 mmol/L (ref 3.5–5.1)
Sodium: 132 mmol/L — ABNORMAL LOW (ref 135–145)

## 2022-01-29 MED ORDER — SODIUM CHLORIDE 0.9% FLUSH
10.0000 mL | Freq: Once | INTRAVENOUS | Status: AC
  Administered 2022-01-29: 10 mL via INTRAVENOUS
  Filled 2022-01-29: qty 10

## 2022-01-29 MED ORDER — HEPARIN SOD (PORK) LOCK FLUSH 100 UNIT/ML IV SOLN
500.0000 [IU] | Freq: Once | INTRAVENOUS | Status: AC
  Administered 2022-01-29: 500 [IU] via INTRAVENOUS
  Filled 2022-01-29: qty 5

## 2022-01-29 NOTE — Progress Notes (Signed)
Patient noticed a change in taste of food this morning.  Having constipation and needing to take milk of magnesia every night to have a daily bowel movement.  Since her surgery she is having increased bloat and gas.  Has neuropathy in both feet with right more than left and is going to start cryotherapy with treatments as advised by Dr. Janese Banks. ?

## 2022-01-29 NOTE — Progress Notes (Signed)
Gynecologic Oncology Interval Visit  ? ?Referring Provider: Dr. Marcelline Mates ? ?Chief Complaint: Carcinosarcoma ? ?Subjective:  ?ARRIANA LOHMANN is a 81 y.o. 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  ? ?She is status post cycle 1 on 01/19/2022.  His been seen in the interim by myself for pelvic pain and spotting. Symptoms thought to be secondary to ongoing post-operative healing.  ? ?Today she presents for chemotherapy tolerance visits/labs and fluids. She reports that she is doing well. She is eating/drinking well and has a good appetite. She is up and moving around and hopes to be cleared to start exercising again. She states that her only concern since chemotherapy is her slightly worsened neuropathy and that she still has some bloating and gas from her surgery. She has seen her specialists for this and last night increased her gabapentin from 200 mg to 329m. She states that her symptoms would not stop her from continuing current therapy. No night sweats, pain, N/V/D, SOB, rash, bleeding/bruising.    ? ? ?Gynecologic Oncology History  ?EMARCELLINA JONSSONis a pleasant female who is seen in consultation from Dr. CMarcelline Matesfor lower pelvic pain x 1 month and uterine carcinosarcoma.  ? ?[place]elvis  ?The uterus is anteverted and measures 9.6 X 7.1 X 5.5 cm. ?Echo texture is heterogenous with evidence of focal mass in the Endometrium. ?  ?The Endometrium measures 35 mm.  There is an echogenic,  mixed echogenicity mass in the endometrial canal ,measuring: 3.7 x 4.1 x 3.3 cm; This is hyperemic and has a small amount of fluid surrounding it within the endometrial cavity. ?  ?Right Ovary is not visualized. ?Left Ovary measures 2.8 x  2.7 x 1.5 cm. It is normal in appearance. ?Survey of the adnexae demonstrates no adnexal masses. ?There is no free fluid in the cul de sac. ? ?She began having vaginal spotting and brown discharge. Abdominal pain increased.  ? ?Underwent hysteroscopy with removal of  endometrial mass and D&C.  ? ?DIAGNOSIS: ?A. ENDOMETRIUM; CURETTAGE: ?- CARCINOSARCOMA / MALIGNANT MIXED MULLERIAN TUMOR. ?  ?B. ENDOMETRIAL MASS; CURETTAGE: ?- CARCINOSARCOMA / MALIGNANT MIXED MULLERIAN TUMOR. ? ?MMRp on IHC ?ER/PR+  ?P53 wild type ? ?She had total laparoscopic hysterectomy with bilateral salpingo-oophorectomy and bilateral pelvic sentinel lymph node injection and mapping with biopsies on 12/24/2021. Additionally, underwent concurrent laproscopic uterosacral suspension and perineal repair with Dr. SLucile Shuttersfor hx of Gehrung pessary/permanent pessary (20 years). Case was discussed at tumor board and adjuvant carbo-taxol x 6 cycles was recommended.   ? ?DIAGNOSIS:  ?A. SENTINEL LYMPH NODE, LEFT EXTERNAL ILIAC; EXCISIONAL BIOPSY:  ?- ONE LYMPH NODE, NEGATIVE FOR MALIGNANCY (0/1).  ? ?B. SENTINEL LYMPH NODE, RIGHT EXTERNAL ILIAC; EXCISIONAL BIOPSY:  ?- ONE LYMPH NODE, NEGATIVE FOR MALIGNANCY (0/1).  ? ?C. UTERUS AND CERVIX WITH BILATERAL FALLOPIAN TUBES AND OVARIES; TOTAL  ?HYSTERECTOMY WITH BILATERAL SALPINGO-OOPHORECTOMY:  ?- ENDOMETRIUM:  ?     - CARCINOSARCOMA / MALIGNANT MIXED MULLERIAN TUMOR.  ?     - SEE CANCER SUMMARY BELOW.  ?- MYOMETRIUM:  ?     - LEIOMYOMA.  ?- UTERINE SEROSA:  ?     - ENDOMETRIOSIS.  ?- FALLOPIAN TUBES:  ?     - NO SIGNIFICANT PATHOLOGIC ALTERATION.  ?- OVARIES:  ?     - CYSTADENOFIBROMA (BILATERAL).  ? ?CASE SUMMARY: (ENDOMETRIUM)  ?Standard(s): AJCC-UICC 8, FIGO Cancer Report 2018  ? ?SPECIMEN  ?Procedure: Total hysterectomy with bilateral salpingo-oophorectomy  ? ?TUMOR  ?Tumor  Size: Greatest dimension: 3.9 cm  ?Histologic Type: Carcinosarcoma  ?Histologic Grade: Not applicable  ?Myometrial Invasion: Present, less than 50%  ?Uterine Serosa Involvement: Not identified  ?Cervical Stromal Involvement: Not identified  ?Other Tissue/Organ Involvement: Not identified  ?Peritoneal/Ascitic Fluid: Malignant cells present  ?Lymphatic and/or Vascular Invasion: Not identified   ? ?MARGINS  ?Margin Status: All margins negative for invasive carcinoma  ? ?REGIONAL LYMPH NODES  ?Regional Lymph Node Status: Regional lymph nodes present, all regional  ?lymph nodes negative for tumor  ? ?Lymph nodes examined:  ?     Total number of pelvic nodes examined (sentinel and non-sentinel):  ?2  ?     Number of pelvic sentinel nodes examined: 2  ?     Total number of para-aortic nodes examined (sentinel and  ?non-sentinel): 0  ?     Number of para-aortic sentinel nodes examined: 0  ? ?DISTANT METASTASIS  ?Distant Site(s) Involved, if applicable: Not applicable  ? ?PATHOLOGIC STAGE CLASSIFICATION (pTNM, AJCC 8th Edition): pT pN / FIGO  ?Modified Classification: Not applicable  ?pT 1a  ?T Suffix: Not applicable  ?Regional Lymph Nodes Modifier: sn  ?pN0  ?pM - Not applicable  ? ?FIGO Stage (2018 FIGO Cancer Report): IA  ? ?DIAGNOSIS:  ?A. PELVIC WASHINGS:  ?- DIAGNOSTIC OF MALIGNANCY.  ?- SEE COMMENT.  ? ? ?PMH significant for DCIS of right breast s/p lumpectomy and radiation in 2021. Patient previously followed by Dr. Jana Hakim at [facility] but has transferred care to Dr. Rogue Bussing at [facility] at [facility] and next appointment in May 2023.  ? ?She complains of persistent of GERD-like symptoms and epigastric discomfort since her hysteroscopy. Incomplete emptying of her bladder. She wears a pessary that has not been removed in years.  ? ?Her PCP recommended a pulmonary consultation given the CT pulmonary findings.  ? ?She had an abnormal EKG on 11/28/2021 that showed PVS, nonspecific ST and T wave abnormalities.  ? ?Problem List: ?Patient Active Problem List  ? Diagnosis Date Noted  ? 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[place]) 01/23/2022  ? Postoperative state 12/24/2021  ? Endometrial cancer ([place])   ? Lung granuloma ([place]) 12/10/2021  ? B12 deficiency 09/25/2021  ? History of ductal carcinoma in situ (DCIS) of breast 09/10/2020  ? Iron deficiency anemia 09/10/2020  ? Ductal carcinoma in situ (DCIS) of right breast 05/17/2020   ? Bilateral carotid artery stenosis 02/06/2020  ? Visual changes 01/12/2020  ? Carotid stenosis 01/11/2020  ? Migraine aura without headache 03/07/2019  ? Complete tear of left rotator cuff 03/10/2017  ? Pessary maintenance 12/18/2016  ? Rectocele 12/18/2016  ? Midline cystocele 12/18/2016  ? Menopausal syndrome on hormone replacement therapy 12/18/2016  ? Allergic rhinitis 12/17/2015  ? Atypical chest pain 12/17/2015  ? Bloodgood disease 12/17/2015  ? Acid reflux 12/17/2015  ? HLD (hyperlipidemia) 12/17/2015  ? Lung mass 12/17/2015  ? Uterine prolapse 12/17/2015  ? CA of skin 12/17/2015  ? Post menopausal syndrome 01/14/2015  ? Incomplete bladder emptying 01/14/2015  ? Neuralgia neuritis, sciatic nerve 01/14/2015  ? H/O malignant neoplasm of skin 09/17/2014  ? ? ?Past Medical History: ?Past Medical History:  ?Diagnosis Date  ? Allergic rhinitis   ? Anemia   ? Anginal pain ([place])   ? Arthritis   ? Atypical chest pain   ? Breast cancer ([place])   ? 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[place])   ? Ductal carcinoma in situ (DCIS) of right breast   ? Fibrocystic breast disease   ? GERD (gastroesophageal reflux  disease)   ? Hearing aid worn   ? bilateral  ? History of hiatal hernia   ? Hyperlipidemia   ? Lung nodule   ? Menopause   ? Migraine   ? Migraine   ? optical migraines  ? Motion sickness   ? Plantar fasciitis   ? Prolapse of female pelvic organs   ? hodge pessary  ? Raynaud's disease   ? Sciatic leg pain   ? Sciatica   ? Skin cancer of face   ? Urinary retention with incomplete bladder emptying   ? 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  ? Vitreous detachment of both eyes   ? ? ?Past Surgical History: ?Past Surgical History:  ?Procedure Laterality Date  ? APPENDECTOMY  1951  ? BREAST LUMPECTOMY WITH RADIOACTIVE SEED LOCALIZATION Right 05/29/2020  ? Procedure: RIGHT BREAST LUMPECTOMY WITH RADIOACTIVE SEED LOCALIZATION;  Surgeon: Wakefield, Matthew, MD;  Location: [facility];  Service: General;  Laterality: Right;  ? CARDIAC  CATHETERIZATION  2004  ? negative  ? CATARACT EXTRACTION W/PHACO Right 02/26/2021  ? Procedure: CATARACT EXTRACTION PHACO AND INTRAOCULAR LENS PLACEMENT (IOC) RIGHT 5.86 01:19.6 7.4%;  Surgeon: Brasington, Cha

## 2022-02-02 ENCOUNTER — Encounter: Payer: Self-pay | Admitting: Obstetrics and Gynecology

## 2022-02-02 ENCOUNTER — Telehealth (INDEPENDENT_AMBULATORY_CARE_PROVIDER_SITE_OTHER): Payer: Self-pay

## 2022-02-02 LAB — SURGICAL PATHOLOGY

## 2022-02-02 NOTE — Telephone Encounter (Signed)
Pt calls and states that her portacath is really painful and red, she cannot stand for anything to touch it.  I spoke with Arna Medici she said have the oncology team access the site and call us with the accessment. Pt states that she has a mammogram scheduled this week and a 5 hour Chemo treatment schedule for one week from tomorrow, she would like for someone to access this problem before anyone starts pulling on her breast or hooking up that pigtail thing at chem pulling on it.  Pt states she is very concerned and she can come tomorrow for someone to look at this area.  I stated that someone would give her a call back after the NP read this message. ?

## 2022-02-03 ENCOUNTER — Encounter: Payer: Self-pay | Admitting: Nurse Practitioner

## 2022-02-03 ENCOUNTER — Inpatient Hospital Stay (HOSPITAL_BASED_OUTPATIENT_CLINIC_OR_DEPARTMENT_OTHER): Payer: PPO | Admitting: Nurse Practitioner

## 2022-02-03 VITALS — BP 149/76 | HR 76 | Temp 98.7°F | Resp 20 | Wt 100.5 lb

## 2022-02-03 DIAGNOSIS — R208 Other disturbances of skin sensation: Secondary | ICD-10-CM

## 2022-02-03 DIAGNOSIS — Z5111 Encounter for antineoplastic chemotherapy: Secondary | ICD-10-CM | POA: Diagnosis not present

## 2022-02-03 DIAGNOSIS — Z452 Encounter for adjustment and management of vascular access device: Secondary | ICD-10-CM | POA: Diagnosis not present

## 2022-02-03 NOTE — Progress Notes (Signed)
? ?Symptom Management Clinic ? ?[place] [place] ?[address], [address] ?[place], [place] [address] ?[phone] (phone) ?[phone] (fax) ? ?Patient Care Team: ?Adin Hector, MD as PCP - General (Internal Medicine) ?Rockwell Germany, RN as Oncology Nurse Navigator ?Mauro Kaufmann, RN as Oncology Nurse Navigator ?Rolm Bookbinder, MD as Consulting Physician (General Surgery) ?Eppie Gibson, MD as Attending Physician (Radiation Oncology) ?Rubie Maid, MD as Consulting Physician (Obstetrics and Gynecology) ?Clent Jacks, RN as Oncology Nurse Navigator ?Sindy Guadeloupe, MD as Consulting Physician (Oncology)  ? ?Name of the patient: Elizabeth Friedman  ?[id number]  ?[birth year]  ? ?Date of visit: 02/03/22 ? ?Diagnosis- Endometrial Cancer ? ?Chief complaint/ Reason for visit- Pain at [facility] site ? ?Heme/Onc history:  ?Oncology History  ?Endometrial cancer ([place])  ?12/24/2021 Initial Diagnosis  ? Endometrial cancer [facility]) ?  ?01/13/2022 Cancer Staging  ? Staging form: Corpus Uteri - Carcinoma and Carcinosarcoma, AJCC 8th Edition ?- Clinical stage from 01/13/2022: FIGO Stage IA (cT1a, cN0, cM0) - Signed by Sindy Guadeloupe, MD on 01/13/2022 ?Histologic grade (G): G3 ?Histologic grading system: 3 grade system ? ?  ?01/19/2022 -  Chemotherapy  ? Patient is on Treatment Plan : UTERINE Carboplatin AUC 5 / Paclitaxel q21d  ?   ? ? ?Interval history- Patient is 81 year old female s/p cycle 1 of carbo-taxol chemotherapy who presents to [facility] for complaints of pain at her port site. Pain is generalized and worse when overlying adhesive dressing is used. Has noticed some bruising since last blood draw which is gradually fading. No swelling or redness. Port is uncomfortable and worse when she wears seat belt. She has been using an overlying padding but pain continues to bother her. Describes a tight, tingling sensation. Worse  after showering.  ? ?Review of systems- Review of Systems  ?Constitutional:  Positive for malaise/fatigue and weight loss. Negative for chills and fever.  ?HENT:  Negative for hearing loss, nosebleeds, sore throat and tinnitus.   ?Eyes:  Negative for blurred vision and double vision.  ?Respiratory:  Negative for cough, hemoptysis, shortness of breath and wheezing.   ?Cardiovascular:  Negative for chest pain, palpitations and leg swelling.  ?Gastrointestinal:  Negative for abdominal pain, blood in stool, constipation, diarrhea, melena, nausea and vomiting.  ?Genitourinary:  Negative for dysuria and urgency.  ?Musculoskeletal:  Negative for back pain, falls, joint pain and myalgias.  ?Skin:  Negative for itching and rash.  ?Neurological:  Negative for dizziness, tingling, sensory change, loss of consciousness, weakness and headaches.  ?Endo/Heme/Allergies:  Negative for environmental allergies. Does not bruise/bleed easily.  ?Psychiatric/Behavioral:  Negative for depression. The patient is nervous/anxious. The patient does not have insomnia.    ? ?No Known Allergies ? ?Past Medical History:  ?Diagnosis Date  ? Allergic rhinitis   ? Anemia   ? Anginal pain ([place])   ? Arthritis   ? Atypical chest pain   ? Breast cancer ([place])   ? 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[place])   ? Ductal carcinoma in situ (DCIS) of right breast   ? Fibrocystic breast disease   ? GERD (gastroesophageal reflux disease)   ? Hearing aid worn   ? bilateral  ? History of hiatal hernia   ? Hyperlipidemia   ? Lung nodule   ? Menopause   ? Migraine   ? Migraine   ? optical migraines  ? Motion sickness   ? Plantar fasciitis   ?  Prolapse of female pelvic organs   ? hodge pessary  ? Raynaud's disease   ? Sciatic leg pain   ? Sciatica   ? Skin cancer of face   ? Urinary retention with incomplete bladder emptying   ? Uterine carcinosarcoma ([place]) 11/2021  ? Vitreous detachment of both eyes   ? ? ?Past Surgical History:  ?Procedure Laterality Date  ? APPENDECTOMY  1951  ? BREAST  LUMPECTOMY WITH RADIOACTIVE SEED LOCALIZATION Right 05/29/2020  ? Procedure: RIGHT BREAST LUMPECTOMY WITH RADIOACTIVE SEED LOCALIZATION;  Surgeon: Rolm Bookbinder, MD;  Location: [place];  Service: General;  Laterality: Right;  ? CARDIAC CATHETERIZATION  2004  ? negative  ? CATARACT EXTRACTION W/PHACO Right 02/26/2021  ? Procedure: CATARACT EXTRACTION PHACO AND INTRAOCULAR LENS PLACEMENT (IOC) RIGHT 5.86 01:19.6 7.4%;  Surgeon: Leandrew Koyanagi, MD;  Location: [place];  Service: Ophthalmology;  Laterality: Right;  ? CATARACT EXTRACTION W/PHACO Left 09/03/2021  ? Procedure: CATARACT EXTRACTION PHACO AND INTRAOCULAR LENS PLACEMENT ([place]) LEFT;  Surgeon: Leandrew Koyanagi, MD;  Location: [place];  Service: Ophthalmology;  Laterality: Left;  9.61 ?01:15.9  ? COLONOSCOPY    ? 2000, 2006, 2012  ? COLONOSCOPY WITH PROPOFOL N/A 03/27/2016  ? Procedure: COLONOSCOPY WITH PROPOFOL;  Surgeon: Manya Silvas, MD;  Location: [facility];  Service: Endoscopy;  Laterality: N/A;  ? COLONOSCOPY WITH PROPOFOL N/A 08/14/2020  ? Procedure: COLONOSCOPY WITH PROPOFOL;  Surgeon: Toledo, Benay Pike, MD;  Location: [facility]COPY;  Service: Gastroenterology;  Laterality: N/A;  ? CYSTOSCOPY N/A 12/24/2021  ? Procedure: CYSTOSCOPY;  Surgeon: Jaquita Folds, MD;  Location: [facility];  Service: Gynecology;  Laterality: N/A;  ? DILATATION & CURETTAGE/HYSTEROSCOPY WITH MYOSURE N/A 12/01/2021  ? Procedure: [place];  Surgeon: Rubie Maid, MD;  Location: [facility];  Service: Gynecology;  Laterality: N/A;  ? ESOPHAGOGASTRODUODENOSCOPY  08/1999  ? ESOPHAGOGASTRODUODENOSCOPY (EGD) WITH PROPOFOL N/A 08/14/2020  ? Procedure: ESOPHAGOGASTRODUODENOSCOPY (EGD) WITH PROPOFOL;  Surgeon: Toledo, Benay Pike, MD;  Location: [facility];  Service: Gastroenterology;  Laterality: N/A;  ? ESOPHAGOGASTRODUODENOSCOPY (EGD) WITH PROPOFOL N/A 03/21/2021  ? Procedure:  ESOPHAGOGASTRODUODENOSCOPY (EGD) WITH PROPOFOL;  Surgeon: Toledo, Benay Pike, MD;  Location: [facility];  Service: Gastroenterology;  Laterality: N/A;  ? PORTA CATH INSERTION N/A 01/15/2022  ? Procedure: PORTA CATH INSERTION;  Surgeon: Algernon Huxley, MD;  Location: [place] [facility];  Service: Cardiovascular;  Laterality: N/A;  ? SKIN CANCER EXCISION    ? face and neck  ? TOTAL LAPAROSCOPIC HYSTERECTOMY WITH BILATERAL SALPINGO OOPHORECTOMY Bilateral 12/24/2021  ? Procedure: TOTAL LAPAROSCOPIC HYSTERECTOMY WITH BILATERAL SALPINGO OOPHORECTOMY, PELVIC NODE BIOPSY,  PESSARY REMOVAL (PESSARY REMOVAL BY DR. Wannetta Sender;  Surgeon: Mellody Drown, MD;  Location: [facility];  Service: Gynecology;  Laterality: Bilateral;  ? ? ?Social History  ? ?Socioeconomic History  ? Marital status: Widowed  ?  Spouse name: Not on file  ? Number of children: Not on file  ? Years of education: Not on file  ? Highest education level: Not on file  ?Occupational History  ? Not on file  ?Tobacco Use  ? Smoking status: Never  ? Smokeless tobacco: Never  ?Vaping Use  ? Vaping Use: Never used  ?Substance and Sexual Activity  ? Alcohol use: No  ? Drug use: No  ? Sexual activity: Not Currently  ?  Birth control/protection: Post-menopausal  ?Other Topics Concern  ? Not on file  ?Social History Narrative  ? Lives alone  ? ?Social Determinants  of Health  ? ?Financial Resource Strain: Not on file  ?Food Insecurity: Not on file  ?Transportation Needs: Not on file  ?Physical Activity: Not on file  ?Stress: Not on file  ?Social Connections: Not on file  ?Intimate Partner Violence: Not on file  ? ? ?Family History  ?Problem Relation Age of Onset  ? Breast cancer Mother   ? Heart disease Father   ? Colon cancer Brother   ? Diabetes Neg Hx   ? Ovarian cancer Neg Hx   ? ? ?Current Outpatient Medications:  ?  acetaminophen (TYLENOL) 500 MG tablet, Take 1,000 mg by mouth every 6 (six) hours as needed for moderate pain or mild pain., Disp: , Rfl:  ?  alum & mag  hydroxide-simeth (MAALOX/MYLANTA) 200-200-20 MG/5ML suspension, Take 30 mLs by mouth every 6 (six) hours as needed for indigestion or heartburn., Disp: , Rfl:  ?  Calcium-Vitamin D (CALTRATE 600 PLUS-VIT D

## 2022-02-03 NOTE — Telephone Encounter (Signed)
I discussed with patient that we typically do not trouble shoot Port a Caths because don't see or manipulate them daily.  The patient wondered if we could add more glue or an ointment to the irritated area.  I advised that we could not replace the glue and she should discuss with oncology if ointments were appropriate.  I reached to her PA, Ms. Covington regarding her concerns and she advised patient to make a symptom management visit.  This information was relayed to the patient

## 2022-02-04 ENCOUNTER — Encounter: Payer: Self-pay | Admitting: Obstetrics and Gynecology

## 2022-02-04 ENCOUNTER — Ambulatory Visit (INDEPENDENT_AMBULATORY_CARE_PROVIDER_SITE_OTHER): Payer: PPO | Admitting: Obstetrics and Gynecology

## 2022-02-04 VITALS — BP 151/83 | HR 68

## 2022-02-04 DIAGNOSIS — Z9889 Other specified postprocedural states: Secondary | ICD-10-CM

## 2022-02-04 NOTE — Patient Instructions (Signed)
Try Miralax- 1 cap every day or as needed. Dissolve in a glass of water.  ? ?Use coconut oil on the vulva and vagina for moisture. Can also massage the opening of the vagina to help it soften.  ? ?Able to take a bath and lift > 10 lbs.  ?

## 2022-02-04 NOTE — Progress Notes (Signed)
[facility] ? ?Date of Visit: 02/04/2022 ? ?History of Present Illness: [name] is a 81 y.o. female scheduled today for a post-operative visit.  ?? Surgery: s/p Laparoscopic uterosacral suspension, cystoscopy, episiotomy and perineal repair on 12/24/21. Co surgery with Drs Fransisca Connors and Amalia Hailey- TLH, BSO, LND ?? She passed her postoperative void trial.  ? ?Today she reports she is doing well. Started chemotherapy treatments.  ? ?UTI in the last 6 weeks? No  ?Pain? No  ?Vaginal bulge? No  ?Stress incontinence: No  ?Urgency/frequency: No  ?Urge incontinence: No  ?Voiding dysfunction: No  ?Bowel issues: Yes - still has to take milk of magnesia overnight. Also using two stool softeners overnight.  ? ?Subjective Success: Do you usually have a bulge or something falling out that you can see or feel in the vaginal area? No  ?Retreatment Success: Any retreatment with surgery or pessary for any compartment? No  ? ? ?Medications: She has a current medication list which includes the following prescription(s): acetaminophen, alum & mag hydroxide-simeth, calcium-vitamin d, carboxymethylcellulose sod pf, celecoxib, vitamin d, coenzyme q10, cyanocobalamin, dexamethasone, gabapentin, vitron-c, lidocaine-prilocaine, lorazepam, omega-3 fatty acids, omeprazole, ondansetron, pantoprazole, prochlorperazine, restasis, simvastatin, topiramate, triamcinolone acetonide, and vitamin e.  ? ?Allergies: Patient has No Known Allergies.  ? ?Physical Exam: ?BP (!) 151/83   Pulse 68   ?Abdomen: soft, non-tender, without masses or organomegaly ?4 laparoscopic Incisions: healing well.  ?Pelvic Examination: Perineum well healed, tender to palpation. Vagina: Incisions healing well. Sutures are present at the cuff and there is not granulation tissue. No tenderness along the anterior or posterior vagina. No apical tenderness. No pelvic masses.  ? ?POP-Q: ?POP-Q ? ?-3  ?                                          Aa   ?-3 ?                                           Ba  ?-7  ?                                            C  ? ?2  ?                                          Gh  ?4  ?                                          Pb  ?7  ?                                          tvl  ? ?-3  ?  Ap  ?-3  ?                                          Bp  ?   ?                                            D  ? ? ? ?--------------------------------------------------------- ? ?Assessment and Plan:  ?1. Post-operative state   ? ? ?- Healing well.  ?- For vaginal moisture, recommended coconut oil. She can also massage the perineum to help with healing and discomfort.  ?- For bowel movements, recommended starting daily miralax in addition to her stool softeners, and to stop the milk of magnesia.  ?- Can resume normal activity and tub baths. Discussed avoidance of heavy lifting and straining long term to reduce the risk of recurrence.  ? ?Follow up as needed. Has further follow up with GYN/ Onc ? ?Elizabeth Folds, MD ? ?

## 2022-02-06 MED FILL — Ferumoxytol Inj 510 MG/17ML (30 MG/ML) (Elemental Fe): INTRAVENOUS | Qty: 17 | Status: AC

## 2022-02-09 ENCOUNTER — Inpatient Hospital Stay: Payer: PPO

## 2022-02-09 ENCOUNTER — Encounter: Payer: Self-pay | Admitting: Oncology

## 2022-02-09 ENCOUNTER — Inpatient Hospital Stay (HOSPITAL_BASED_OUTPATIENT_CLINIC_OR_DEPARTMENT_OTHER): Payer: PPO | Admitting: Oncology

## 2022-02-09 VITALS — BP 158/80 | HR 76 | Resp 18

## 2022-02-09 VITALS — BP 139/67 | HR 74 | Temp 97.6°F | Resp 14 | Wt 100.7 lb

## 2022-02-09 DIAGNOSIS — E538 Deficiency of other specified B group vitamins: Secondary | ICD-10-CM

## 2022-02-09 DIAGNOSIS — G620 Drug-induced polyneuropathy: Secondary | ICD-10-CM | POA: Diagnosis not present

## 2022-02-09 DIAGNOSIS — T451X5A Adverse effect of antineoplastic and immunosuppressive drugs, initial encounter: Secondary | ICD-10-CM | POA: Diagnosis not present

## 2022-02-09 DIAGNOSIS — C541 Malignant neoplasm of endometrium: Secondary | ICD-10-CM

## 2022-02-09 DIAGNOSIS — Z5111 Encounter for antineoplastic chemotherapy: Secondary | ICD-10-CM

## 2022-02-09 LAB — COMPREHENSIVE METABOLIC PANEL
ALT: 17 U/L (ref 0–44)
AST: 20 U/L (ref 15–41)
Albumin: 3.6 g/dL (ref 3.5–5.0)
Alkaline Phosphatase: 121 U/L (ref 38–126)
Anion gap: 6 (ref 5–15)
BUN: 14 mg/dL (ref 8–23)
CO2: 24 mmol/L (ref 22–32)
Calcium: 9 mg/dL (ref 8.9–10.3)
Chloride: 103 mmol/L (ref 98–111)
Creatinine, Ser: 0.77 mg/dL (ref 0.44–1.00)
GFR, Estimated: >60 mL/min (ref >60)
Glucose, Bld: 112 mg/dL — ABNORMAL HIGH (ref 70–99)
Potassium: 4.5 mmol/L (ref 3.5–5.1)
Sodium: 133 mmol/L — ABNORMAL LOW (ref 135–145)
Total Bilirubin: 0.2 mg/dL — ABNORMAL LOW (ref 0.3–1.2)
Total Protein: 6.3 g/dL — ABNORMAL LOW (ref 6.5–8.1)

## 2022-02-09 LAB — CBC WITH DIFFERENTIAL/PLATELET
Abs Immature Granulocytes: 0.03 10*3/uL (ref 0.00–0.07)
Basophils Absolute: 0.1 10*3/uL (ref 0.0–0.1)
Basophils Relative: 1 %
Eosinophils Absolute: 0 10*3/uL (ref 0.0–0.5)
Eosinophils Relative: 0 %
HCT: 30.6 % — ABNORMAL LOW (ref 36.0–46.0)
Hemoglobin: 10.3 g/dL — ABNORMAL LOW (ref 12.0–15.0)
Immature Granulocytes: 1 %
Lymphocytes Relative: 15 %
Lymphs Abs: 0.9 10*3/uL (ref 0.7–4.0)
MCH: 31.1 pg (ref 26.0–34.0)
MCHC: 33.7 g/dL (ref 30.0–36.0)
MCV: 92.4 fL (ref 80.0–100.0)
Monocytes Absolute: 0.8 10*3/uL (ref 0.1–1.0)
Monocytes Relative: 12 %
Neutro Abs: 4.6 10*3/uL (ref 1.7–7.7)
Neutrophils Relative %: 71 %
Platelets: 263 10*3/uL (ref 150–400)
RBC: 3.31 MIL/uL — ABNORMAL LOW (ref 3.87–5.11)
RDW: 14.6 % (ref 11.5–15.5)
WBC: 6.5 10*3/uL (ref 4.0–10.5)
nRBC: 0 % (ref 0.0–0.2)

## 2022-02-09 MED ORDER — HEPARIN SOD (PORK) LOCK FLUSH 100 UNIT/ML IV SOLN
500.0000 [IU] | Freq: Once | INTRAVENOUS | Status: AC | PRN
  Administered 2022-02-09: 500 [IU]
  Filled 2022-02-09: qty 5

## 2022-02-09 MED ORDER — SODIUM CHLORIDE 0.9 % IV SOLN
150.0000 mg | Freq: Once | INTRAVENOUS | Status: AC
  Administered 2022-02-09: 150 mg via INTRAVENOUS
  Filled 2022-02-09: qty 150

## 2022-02-09 MED ORDER — PALONOSETRON HCL INJECTION 0.25 MG/5ML
0.2500 mg | Freq: Once | INTRAVENOUS | Status: AC
  Administered 2022-02-09: 0.25 mg via INTRAVENOUS
  Filled 2022-02-09: qty 5

## 2022-02-09 MED ORDER — SODIUM CHLORIDE 0.9 % IV SOLN
Freq: Once | INTRAVENOUS | Status: AC
  Filled 2022-02-09: qty 250

## 2022-02-09 MED ORDER — SODIUM CHLORIDE 0.9 % IV SOLN
10.0000 mg | Freq: Once | INTRAVENOUS | Status: AC
  Administered 2022-02-09: 10 mg via INTRAVENOUS
  Filled 2022-02-09: qty 10

## 2022-02-09 MED ORDER — DIPHENHYDRAMINE HCL 50 MG/ML IJ SOLN
50.0000 mg | Freq: Once | INTRAMUSCULAR | Status: AC
  Administered 2022-02-09: 50 mg via INTRAVENOUS
  Filled 2022-02-09: qty 1

## 2022-02-09 MED ORDER — FAMOTIDINE IN NACL 20-0.9 MG/50ML-% IV SOLN
20.0000 mg | Freq: Once | INTRAVENOUS | Status: AC
  Administered 2022-02-09: 20 mg via INTRAVENOUS
  Filled 2022-02-09: qty 50

## 2022-02-09 MED ORDER — SODIUM CHLORIDE 0.9 % IV SOLN
230.4000 mg | Freq: Once | INTRAVENOUS | Status: AC
  Administered 2022-02-09: 230 mg via INTRAVENOUS
  Filled 2022-02-09: qty 23

## 2022-02-09 MED ORDER — SODIUM CHLORIDE 0.9 % IV SOLN
135.0000 mg/m2 | Freq: Once | INTRAVENOUS | Status: AC
  Administered 2022-02-09: 192 mg via INTRAVENOUS
  Filled 2022-02-09: qty 32

## 2022-02-09 MED ORDER — CYANOCOBALAMIN 1000 MCG/ML IJ SOLN
1000.0000 ug | INTRAMUSCULAR | Status: DC
  Administered 2022-02-09: 1000 ug via INTRAMUSCULAR
  Filled 2022-02-09: qty 1

## 2022-02-09 NOTE — Progress Notes (Signed)
Pt states no new concerns for today. ?

## 2022-02-09 NOTE — Progress Notes (Signed)
? ? ? ?Hematology/Oncology Consult note ?[place]  ?[phone] [phone] ? ?Patient Care Team: ?Adin Hector, MD as PCP - General (Internal Medicine) ?Rockwell Germany, RN as Oncology Nurse Navigator ?Mauro Kaufmann, RN as Oncology Nurse Navigator ?Rolm Bookbinder, MD as Consulting Physician (General Surgery) ?Eppie Gibson, MD as Attending Physician (Radiation Oncology) ?Rubie Maid, MD as Consulting Physician (Obstetrics and Gynecology) ?Clent Jacks, RN as Oncology Nurse Navigator ?Sindy Guadeloupe, MD as Consulting Physician (Oncology)  ? ?Name of the patient: Elizabeth Friedman  ?[id number]  ?[birth year]  ? ?Date of visit: 02/09/22 ? 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 ?Chief complaint/ Reason for visit-treatment assessment prior to cycle 2 of CarboTaxol chemotherapy ? ?Heme/Onc history: Patient is a 81 year old female who was having pelvic pain symptoms for a month.  Initially she saw GI but given her history of pessary placement for 20 years she decided to see GYN.  She had an ultrasound pelvis which showed mixed echogenicity mass in the endometrial canal measuring 3.7 x 4.1 x 3.3 cm.  She was also having some vaginal spotting and brown discharge.  She underwent hysteroscopy with removal of endometrial mass and D&C which showed carcinosarcoma/malignant mixed m?llerian tumor. ?  ?CT chest abdomen and pelvis showed enhancing lesion in the anterior fundus but no other evidence of distant metastatic disease in chest abdomen and pelvis. ?  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  She was following up with Dr. Jana Hakim at [facility] previously.  She is not on any endocrine therapy for the same. ?  ?Patient underwent total laparoscopic hysterectomy with bilateral salpingo-oophorectomy and bilateral pelvic sentinel lymph node injection mapping and biopsies on 12/24/2021.   Final pathology showed a 3.9 cm carcinosarcoma.  Less than 50% myometrial invasion.  Uterine serosa not identified.  Malignant cells were present in the peritoneal/ascites fluid.  Lymphatic invasion not identified.  Margins negative.  2 sentinel lymph nodes on the right and left iliac were negative for malignancy.  No evidence of malignancy in the fallopian tubes.  Cystadenofibroma noted in the ovaries pT1a pN0 FIGO stage IA ?  ?Plan is to proceed with adjuvant CarboTaxol chemotherapy every 3 weeks x6 cycles if patient can tolerate it especially given positive pelvic washings.  Followed by vaginal brachytherapy ?  ? ?Interval history-patient has some baseline peripheral neuropathy even prior to start of chemotherapy and states that symptoms have worsened after cycle 1.  She did not have much nausea vomiting.  Reports mild fatigue but denies other complaints ? ?ECOG PS- 1 ?Pain scale- 0 ?Opioid associated constipation- no ? ?Review of systems- Review of Systems  ?Constitutional:  Positive for malaise/fatigue. Negative for chills, fever and weight loss.  ?HENT:  Negative for congestion, ear discharge and nosebleeds.   ?Eyes:  Negative for blurred vision.  ?Respiratory:  Negative for cough, hemoptysis, sputum production, shortness of breath and wheezing.   ?Cardiovascular:  Negative for chest pain, palpitations, orthopnea and claudication.  ?Gastrointestinal:  Negative for abdominal pain, blood in stool, constipation, diarrhea, heartburn, melena, nausea and vomiting.  ?Genitourinary:  Negative for dysuria, flank pain, frequency, hematuria and urgency.  ?Musculoskeletal:  Negative for back pain, joint pain and myalgias.  ?Skin:  Negative for rash.  ?Neurological:  Positive for sensory change (Peripheral neuropathy). Negative for dizziness, tingling, focal weakness, seizures, weakness and headaches.  ?Endo/Heme/Allergies:  Does not bruise/bleed easily.  ?  Psychiatric/Behavioral:  Negative for depression and suicidal  ideas. The patient does not have insomnia.    ? ? ? ?No Known Allergies ? ? ?Past Medical History:  ?Diagnosis Date  ? Allergic rhinitis   ? Anemia   ? Anginal pain ([place])   ? Arthritis   ? Atypical chest pain   ? Breast cancer ([place])   ? 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[place])   ? Ductal carcinoma in situ (DCIS) of right breast   ? Fibrocystic breast disease   ? GERD (gastroesophageal reflux disease)   ? Hearing aid worn   ? bilateral  ? History of hiatal hernia   ? Hyperlipidemia   ? Lung nodule   ? Menopause   ? Migraine   ? Migraine   ? optical migraines  ? Motion sickness   ? Plantar fasciitis   ? Prolapse of female pelvic organs   ? hodge pessary  ? Raynaud's disease   ? Sciatic leg pain   ? Sciatica   ? Skin cancer of face   ? Urinary retention with incomplete bladder emptying   ? Uterine carcinosarcoma ([place]) 11/2021  ? Vitreous detachment of both eyes   ? ? ? ?Past Surgical History:  ?Procedure Laterality Date  ? APPENDECTOMY  1951  ? BREAST LUMPECTOMY WITH RADIOACTIVE SEED LOCALIZATION Right 05/29/2020  ? Procedure: RIGHT BREAST LUMPECTOMY WITH RADIOACTIVE SEED LOCALIZATION;  Surgeon: Rolm Bookbinder, MD;  Location: [facility];  Service: General;  Laterality: Right;  ? CARDIAC CATHETERIZATION  2004  ? negative  ? CATARACT EXTRACTION W/PHACO Right 02/26/2021  ? Procedure: CATARACT EXTRACTION PHACO AND INTRAOCULAR LENS PLACEMENT ([facility]) RIGHT 5.86 01:19.6 7.4%;  Surgeon: Leandrew Koyanagi, MD;  Location: [facility];  Service: Ophthalmology;  Laterality: Right;  ? CATARACT EXTRACTION W/PHACO Left 09/03/2021  ? Procedure: CATARACT EXTRACTION PHACO AND INTRAOCULAR LENS PLACEMENT ([place]) LEFT;  Surgeon: Leandrew Koyanagi, MD;  Location: [facility];  Service: Ophthalmology;  Laterality: Left;  9.61 ?01:15.9  ? COLONOSCOPY    ? 2000, 2006, 2012  ? COLONOSCOPY WITH PROPOFOL N/A 03/27/2016  ? Procedure: COLONOSCOPY WITH PROPOFOL;  Surgeon: Manya Silvas, MD;  Location: [facility] ENDOSCOPY;  Service:  Endoscopy;  Laterality: N/A;  ? COLONOSCOPY WITH PROPOFOL N/A 08/14/2020  ? Procedure: COLONOSCOPY WITH PROPOFOL;  Surgeon: Toledo, Benay Pike, MD;  Location: [facility] ENDOSCOPY;  Service: Gastroenterology;  Laterality: N/A;  ? CYSTOSCOPY N/A 12/24/2021  ? Procedure: CYSTOSCOPY;  Surgeon: Jaquita Folds, MD;  Location: [facility] ORS;  Service: Gynecology;  Laterality: N/A;  ? DILATATION & CURETTAGE/HYSTEROSCOPY WITH MYOSURE N/A 12/01/2021  ? Procedure: [place];  Surgeon: Rubie Maid, MD;  Location: [facility] ORS;  Service: Gynecology;  Laterality: N/A;  ? ESOPHAGOGASTRODUODENOSCOPY  08/1999  ? ESOPHAGOGASTRODUODENOSCOPY (EGD) WITH PROPOFOL N/A 08/14/2020  ? Procedure: ESOPHAGOGASTRODUODENOSCOPY (EGD) WITH PROPOFOL;  Surgeon: Toledo, Benay Pike, MD;  Location: [facility] ENDOSCOPY;  Service: Gastroenterology;  Laterality: N/A;  ? ESOPHAGOGASTRODUODENOSCOPY (EGD) WITH PROPOFOL N/A 03/21/2021  ? Procedure: ESOPHAGOGASTRODUODENOSCOPY (EGD) WITH PROPOFOL;  Surgeon: Toledo, Benay Pike, MD;  Location: [facility] ENDOSCOPY;  Service: Gastroenterology;  Laterality: N/A;  ? PORTA CATH INSERTION N/A 01/15/2022  ? Procedure: PORTA CATH INSERTION;  Surgeon: Algernon Huxley, MD;  Location: [facility];  Service: Cardiovascular;  Laterality: N/A;  ? SKIN CANCER EXCISION    ? face and neck  ? TOTAL LAPAROSCOPIC HYSTERECTOMY WITH BILATERAL SALPINGO OOPHORECTOMY Bilateral 12/24/2021  ? Procedure: TOTAL LAPAROSCOPIC HYSTERECTOMY WITH BILATERAL SALPINGO OOPHORECTOMY, PELVIC NODE BIOPSY,  PESSARY  REMOVAL (PESSARY REMOVAL BY DR. Wannetta Sender;  Surgeon: Mellody Drown, MD;  Location: [facility] ORS;  Service: Gynecology;  Laterality: Bilateral;  ? ? ?Social History  ? ?Socioeconomic History  ? Marital status: Widowed  ?  Spouse name: Not on file  ? Number of children: Not on file  ? Years of education: Not on file  ? Highest education level: Not on file  ?Occupational History  ? Not on file  ?Tobacco Use  ? Smoking status:  Never  ? Smokeless tobacco: Never  ?Vaping Use  ? Vaping Use: Never used  ?Substance and Sexual Activity  ? Alcohol use: No  ? Drug use: No  ? Sexual activity: Not Currently  ?  Birth control/protection: P

## 2022-02-09 NOTE — Patient Instructions (Signed)
[facility] CANCER [facility] AT [place]  Discharge Instructions: ?Thank you for choosing [facility] to provide your oncology and hematology care.  ?If you have a lab appointment with the [facility], please go directly to the [facility] and check in at the registration area. ? ?Wear comfortable clothing and clothing appropriate for easy access to any Portacath or PICC line.  ? ?We strive to give you quality time with your provider. You may need to reschedule your appointment if you arrive late (15 or more minutes).  Arriving late affects you and other patients whose appointments are after yours.  Also, if you miss three or more appointments without notifying the office, you may be dismissed from the clinic at the provider?s discretion.    ?  ?For prescription refill requests, have your pharmacy contact our office and allow 72 hours for refills to be completed.   ? ?Today you received the following chemotherapy and/or immunotherapy agents TAXOL and CARBOPLATIN    ?  ?To help prevent nausea and vomiting after your treatment, we encourage you to take your nausea medication as directed. ? ?BELOW ARE SYMPTOMS THAT SHOULD BE REPORTED IMMEDIATELY: ?*FEVER GREATER THAN 100.4 F (38 ?C) OR HIGHER ?*CHILLS OR SWEATING ?*NAUSEA AND VOMITING THAT IS NOT CONTROLLED WITH YOUR NAUSEA MEDICATION ?*UNUSUAL SHORTNESS OF BREATH ?*UNUSUAL BRUISING OR BLEEDING ?*URINARY PROBLEMS (pain or burning when urinating, or frequent urination) ?*BOWEL PROBLEMS (unusual diarrhea, constipation, pain near the anus) ?TENDERNESS IN MOUTH AND THROAT WITH OR WITHOUT PRESENCE OF ULCERS (sore throat, sores in mouth, or a toothache) ?UNUSUAL RASH, SWELLING OR PAIN  ?UNUSUAL VAGINAL DISCHARGE OR ITCHING  ? ?Items with * indicate a potential emergency and should be followed up as soon as possible or go to the Emergency Department if any problems should occur. ? ?Please show the CHEMOTHERAPY ALERT CARD or IMMUNOTHERAPY ALERT CARD at  check-in to the Emergency Department and triage nurse. ? ?Should you have questions after your visit or need to cancel or reschedule your appointment, please contact [facility] CANCER [place] AT [place]  [phone] and follow the prompts.  Office hours are 8:00 a.m. to 4:30 p.m. Monday - Friday. Please note that voicemails left after 4:00 p.m. may not be returned until the following business day.  We are closed weekends and major holidays. You have access to a nurse at all times for urgent questions. Please call the main number to the clinic [phone] and follow the prompts. ? ?For any non-urgent questions, you may also contact your provider using MyChart. We now offer e-Visits for anyone 13 and older to request care online for non-urgent symptoms. For details visit [email or web address]. ?  ?Also download the MyChart app! Go to the app store, search "MyChart", open the app, select [facility], and log in with your MyChart username and password. ? ?Due to Covid, a mask is required upon entering the hospital/clinic. If you do not have a mask, one will be given to you upon arrival. For doctor visits, patients may have 1 support person aged 61 or older with them. For treatment visits, patients cannot have anyone with them due to current Covid guidelines and our immunocompromised population.  ? ?Paclitaxel injection ?What is this medication? ?PACLITAXEL (PAK li TAX el) is a chemotherapy drug. It targets fast dividing cells, like cancer cells, and causes these cells to die. This medicine is used to treat ovarian cancer, breast cancer, lung cancer, Kaposi's sarcoma, and other cancers. ?This medicine may be used for other  purposes; ask your health care provider or pharmacist if you have questions. ?COMMON BRAND NAME(S): Onxol, Taxol ?What should I tell my care team before I take this medication? ?They need to know if you have any of these conditions: ?history of irregular heartbeat ?liver disease ?low blood  counts, like low white cell, platelet, or red cell counts ?lung or breathing disease, like asthma ?tingling of the fingers or toes, or other nerve disorder ?an unusual or allergic reaction to paclitaxel, alcohol, polyoxyethylated castor oil, other chemotherapy, other medicines, foods, dyes, or preservatives ?pregnant or trying to get pregnant ?breast-feeding ?How should I use this medication? ?This drug is given as an infusion into a vein. It is administered in a hospital or clinic by a specially trained health care professional. ?Talk to your pediatrician regarding the use of this medicine in children. Special care may be needed. ?Overdosage: If you think you have taken too much of this medicine contact a poison control center or emergency room at once. ?NOTE: This medicine is only for you. Do not share this medicine with others. ?What if I miss a dose? ?It is important not to miss your dose. Call your doctor or health care professional if you are unable to keep an appointment. ?What may interact with this medication? ?Do not take this medicine with any of the following medications: ?live virus vaccines ?This medicine may also interact with the following medications: ?antiviral medicines for hepatitis, HIV or AIDS ?certain antibiotics like erythromycin and clarithromycin ?certain medicines for fungal infections like ketoconazole and itraconazole ?certain medicines for seizures like carbamazepine, phenobarbital, phenytoin ?gemfibrozil ?nefazodone ?rifampin ?St. John's wort ?This list may not describe all possible interactions. Give your health care provider a list of all the medicines, herbs, non-prescription drugs, or dietary supplements you use. Also tell them if you smoke, drink alcohol, or use illegal drugs. Some items may interact with your medicine. ?What should I watch for while using this medication? ?Your condition will be monitored carefully while you are receiving this medicine. You will need important  blood work done while you are taking this medicine. ?This medicine can cause serious allergic reactions. To reduce your risk you will need to take other medicine(s) before treatment with this medicine. If you experience allergic reactions like skin rash, itching or hives, swelling of the face, lips, or tongue, tell your doctor or health care professional right away. ?In some cases, you may be given additional medicines to help with side effects. Follow all directions for their use. ?This drug may make you feel generally unwell. This is not uncommon, as chemotherapy can affect healthy cells as well as cancer cells. Report any side effects. Continue your course of treatment even though you feel ill unless your doctor tells you to stop. ?Call your doctor or health care professional for advice if you get a fever, chills or sore throat, or other symptoms of a cold or flu. Do not treat yourself. This drug decreases your body's ability to fight infections. Try to avoid being around people who are sick. ?This medicine may increase your risk to bruise or bleed. Call your doctor or health care professional if you notice any unusual bleeding. ?Be careful brushing and flossing your teeth or using a toothpick because you may get an infection or bleed more easily. If you have any dental work done, tell your dentist you are receiving this medicine. ?Avoid taking products that contain aspirin, acetaminophen, ibuprofen, naproxen, or ketoprofen unless instructed by your doctor. These medicines may  hide a fever. ?Do not become pregnant while taking this medicine. Women should inform their doctor if they wish to become pregnant or think they might be pregnant. There is a potential for serious side effects to an unborn child. Talk to your health care professional or pharmacist for more information. Do not breast-feed an infant while taking this medicine. ?Men are advised not to father a child while receiving this medicine. ?This product  may contain alcohol. Ask your pharmacist or healthcare provider if this medicine contains alcohol. Be sure to tell all healthcare providers you are taking this medicine. Certain medicines, like metronid

## 2022-02-11 ENCOUNTER — Ambulatory Visit: Payer: PPO

## 2022-02-11 ENCOUNTER — Inpatient Hospital Stay: Payer: PPO

## 2022-02-12 ENCOUNTER — Inpatient Hospital Stay: Payer: PPO

## 2022-02-12 VITALS — BP 139/65 | HR 69 | Temp 97.4°F | Resp 18

## 2022-02-12 DIAGNOSIS — C541 Malignant neoplasm of endometrium: Secondary | ICD-10-CM

## 2022-02-12 DIAGNOSIS — Z5111 Encounter for antineoplastic chemotherapy: Secondary | ICD-10-CM | POA: Diagnosis not present

## 2022-02-12 MED ORDER — PEGFILGRASTIM-CBQV 6 MG/0.6ML ~~LOC~~ SOSY
6.0000 mg | PREFILLED_SYRINGE | Freq: Once | SUBCUTANEOUS | Status: AC
  Administered 2022-02-12: 6 mg via SUBCUTANEOUS
  Filled 2022-02-12: qty 0.6

## 2022-02-12 NOTE — Progress Notes (Signed)
Patient scheduled to receive Udenyca and Feraheme today. Patient was not aware of infusion and is hesitant on accessing her port as she did not place EMLA cream on port. Dr. Janese Banks made aware and requested that Feraheme be rescheduled with next chemo appt. Patient made aware and verbalizes understanding.  ?

## 2022-02-19 ENCOUNTER — Other Ambulatory Visit: Payer: PPO

## 2022-02-19 ENCOUNTER — Encounter: Payer: PPO | Admitting: Licensed Clinical Social Worker

## 2022-02-19 DIAGNOSIS — Z853 Personal history of malignant neoplasm of breast: Secondary | ICD-10-CM | POA: Diagnosis not present

## 2022-02-19 LAB — HM MAMMOGRAPHY

## 2022-02-23 ENCOUNTER — Other Ambulatory Visit: Payer: PPO

## 2022-02-23 ENCOUNTER — Ambulatory Visit: Payer: PPO | Admitting: Internal Medicine

## 2022-02-23 ENCOUNTER — Encounter: Payer: Self-pay | Admitting: Oncology

## 2022-03-02 ENCOUNTER — Inpatient Hospital Stay: Payer: PPO | Attending: Obstetrics and Gynecology

## 2022-03-02 ENCOUNTER — Inpatient Hospital Stay (HOSPITAL_BASED_OUTPATIENT_CLINIC_OR_DEPARTMENT_OTHER): Payer: PPO | Admitting: Oncology

## 2022-03-02 ENCOUNTER — Encounter: Payer: Self-pay | Admitting: Oncology

## 2022-03-02 ENCOUNTER — Inpatient Hospital Stay: Payer: PPO

## 2022-03-02 VITALS — BP 132/68 | HR 80 | Temp 97.7°F | Resp 14 | Wt 103.5 lb

## 2022-03-02 DIAGNOSIS — Z5111 Encounter for antineoplastic chemotherapy: Secondary | ICD-10-CM | POA: Insufficient documentation

## 2022-03-02 DIAGNOSIS — C541 Malignant neoplasm of endometrium: Secondary | ICD-10-CM | POA: Diagnosis not present

## 2022-03-02 DIAGNOSIS — E538 Deficiency of other specified B group vitamins: Secondary | ICD-10-CM

## 2022-03-02 DIAGNOSIS — G620 Drug-induced polyneuropathy: Secondary | ICD-10-CM | POA: Diagnosis not present

## 2022-03-02 DIAGNOSIS — D509 Iron deficiency anemia, unspecified: Secondary | ICD-10-CM | POA: Insufficient documentation

## 2022-03-02 DIAGNOSIS — Z79899 Other long term (current) drug therapy: Secondary | ICD-10-CM | POA: Insufficient documentation

## 2022-03-02 DIAGNOSIS — T451X5A Adverse effect of antineoplastic and immunosuppressive drugs, initial encounter: Secondary | ICD-10-CM | POA: Diagnosis not present

## 2022-03-02 DIAGNOSIS — Z5189 Encounter for other specified aftercare: Secondary | ICD-10-CM | POA: Diagnosis not present

## 2022-03-02 LAB — COMPREHENSIVE METABOLIC PANEL
ALT: 18 U/L (ref 0–44)
AST: 23 U/L (ref 15–41)
Albumin: 3.6 g/dL (ref 3.5–5.0)
Alkaline Phosphatase: 132 U/L — ABNORMAL HIGH (ref 38–126)
Anion gap: 8 (ref 5–15)
BUN: 19 mg/dL (ref 8–23)
CO2: 22 mmol/L (ref 22–32)
Calcium: 8.9 mg/dL (ref 8.9–10.3)
Chloride: 105 mmol/L (ref 98–111)
Creatinine, Ser: 0.75 mg/dL (ref 0.44–1.00)
GFR, Estimated: >60 mL/min (ref >60)
Glucose, Bld: 105 mg/dL — ABNORMAL HIGH (ref 70–99)
Potassium: 4.5 mmol/L (ref 3.5–5.1)
Sodium: 135 mmol/L (ref 135–145)
Total Bilirubin: 0.3 mg/dL (ref 0.3–1.2)
Total Protein: 6.3 g/dL — ABNORMAL LOW (ref 6.5–8.1)

## 2022-03-02 LAB — CBC WITH DIFFERENTIAL/PLATELET
Abs Immature Granulocytes: 0.02 10*3/uL (ref 0.00–0.07)
Basophils Absolute: 0.1 10*3/uL (ref 0.0–0.1)
Basophils Relative: 1 %
Eosinophils Absolute: 0 10*3/uL (ref 0.0–0.5)
Eosinophils Relative: 1 %
HCT: 31 % — ABNORMAL LOW (ref 36.0–46.0)
Hemoglobin: 10.1 g/dL — ABNORMAL LOW (ref 12.0–15.0)
Immature Granulocytes: 0 %
Lymphocytes Relative: 16 %
Lymphs Abs: 1.1 10*3/uL (ref 0.7–4.0)
MCH: 30.9 pg (ref 26.0–34.0)
MCHC: 32.6 g/dL (ref 30.0–36.0)
MCV: 94.8 fL (ref 80.0–100.0)
Monocytes Absolute: 0.8 10*3/uL (ref 0.1–1.0)
Monocytes Relative: 12 %
Neutro Abs: 4.7 10*3/uL (ref 1.7–7.7)
Neutrophils Relative %: 70 %
Platelets: 260 10*3/uL (ref 150–400)
RBC: 3.27 MIL/uL — ABNORMAL LOW (ref 3.87–5.11)
RDW: 16.7 % — ABNORMAL HIGH (ref 11.5–15.5)
WBC: 6.7 10*3/uL (ref 4.0–10.5)
nRBC: 0 % (ref 0.0–0.2)

## 2022-03-02 MED ORDER — SODIUM CHLORIDE 0.9 % IV SOLN
10.0000 mg | Freq: Once | INTRAVENOUS | Status: AC
  Administered 2022-03-02: 10 mg via INTRAVENOUS
  Filled 2022-03-02: qty 10

## 2022-03-02 MED ORDER — SODIUM CHLORIDE 0.9 % IV SOLN
230.4000 mg | Freq: Once | INTRAVENOUS | Status: AC
  Administered 2022-03-02: 230 mg via INTRAVENOUS
  Filled 2022-03-02: qty 23

## 2022-03-02 MED ORDER — SODIUM CHLORIDE 0.9 % IV SOLN
135.0000 mg/m2 | Freq: Once | INTRAVENOUS | Status: AC
  Administered 2022-03-02: 192 mg via INTRAVENOUS
  Filled 2022-03-02: qty 32

## 2022-03-02 MED ORDER — PALONOSETRON HCL INJECTION 0.25 MG/5ML
0.2500 mg | Freq: Once | INTRAVENOUS | Status: AC
  Administered 2022-03-02: 0.25 mg via INTRAVENOUS
  Filled 2022-03-02: qty 5

## 2022-03-02 MED ORDER — SODIUM CHLORIDE 0.9 % IV SOLN
510.0000 mg | INTRAVENOUS | Status: DC
  Administered 2022-03-02: 510 mg via INTRAVENOUS
  Filled 2022-03-02: qty 510

## 2022-03-02 MED ORDER — SODIUM CHLORIDE 0.9 % IV SOLN
Freq: Once | INTRAVENOUS | Status: AC
  Filled 2022-03-02: qty 250

## 2022-03-02 MED ORDER — SODIUM CHLORIDE 0.9 % IV SOLN
150.0000 mg | Freq: Once | INTRAVENOUS | Status: AC
  Administered 2022-03-02: 150 mg via INTRAVENOUS
  Filled 2022-03-02: qty 150

## 2022-03-02 MED ORDER — CYANOCOBALAMIN 1000 MCG/ML IJ SOLN
1000.0000 ug | INTRAMUSCULAR | Status: DC
  Administered 2022-03-02: 1000 ug via INTRAMUSCULAR
  Filled 2022-03-02: qty 1

## 2022-03-02 MED ORDER — DIPHENHYDRAMINE HCL 50 MG/ML IJ SOLN
50.0000 mg | Freq: Once | INTRAMUSCULAR | Status: AC
  Administered 2022-03-02: 50 mg via INTRAVENOUS
  Filled 2022-03-02: qty 1

## 2022-03-02 MED ORDER — FAMOTIDINE IN NACL 20-0.9 MG/50ML-% IV SOLN
20.0000 mg | Freq: Once | INTRAVENOUS | Status: AC
  Administered 2022-03-02: 20 mg via INTRAVENOUS
  Filled 2022-03-02: qty 50

## 2022-03-02 MED ORDER — HEPARIN SOD (PORK) LOCK FLUSH 100 UNIT/ML IV SOLN
500.0000 [IU] | Freq: Once | INTRAVENOUS | Status: AC | PRN
  Administered 2022-03-02: 500 [IU]
  Filled 2022-03-02: qty 5

## 2022-03-02 NOTE — Patient Instructions (Signed)
[facility] AT [place]  Discharge Instructions: ?Thank you for choosing [facility] to provide your oncology and hematology care.  ?If you have a lab appointment with the [facility], please go directly to the [facility] and check in at the registration area. ? ?Wear comfortable clothing and clothing appropriate for easy access to any Portacath or PICC line.  ? ?We strive to give you quality time with your provider. You may need to reschedule your appointment if you arrive late (15 or more minutes).  Arriving late affects you and other patients whose appointments are after yours.  Also, if you miss three or more appointments without notifying the office, you may be dismissed from the clinic at the provider?s discretion.    ?  ?For prescription refill requests, have your pharmacy contact our office and allow 72 hours for refills to be completed.   ? ?Today you received the following chemotherapy and/or immunotherapy agents TAXOL, CAARBOPLATIN, FERAHEME, VIT b12    ?  ?To help prevent nausea and vomiting after your treatment, we encourage you to take your nausea medication as directed. ? ?BELOW ARE SYMPTOMS THAT SHOULD BE REPORTED IMMEDIATELY: ?*FEVER GREATER THAN 100.4 F (38 ?C) OR HIGHER ?*CHILLS OR SWEATING ?*NAUSEA AND VOMITING THAT IS NOT CONTROLLED WITH YOUR NAUSEA MEDICATION ?*UNUSUAL SHORTNESS OF BREATH ?*UNUSUAL BRUISING OR BLEEDING ?*URINARY PROBLEMS (pain or burning when urinating, or frequent urination) ?*BOWEL PROBLEMS (unusual diarrhea, constipation, pain near the anus) ?TENDERNESS IN MOUTH AND THROAT WITH OR WITHOUT PRESENCE OF ULCERS (sore throat, sores in mouth, or a toothache) ?UNUSUAL RASH, SWELLING OR PAIN  ?UNUSUAL VAGINAL DISCHARGE OR ITCHING  ? ?Items with * indicate a potential emergency and should be followed up as soon as possible or go to the Emergency Department if any problems should occur. ? ?Please show the CHEMOTHERAPY ALERT CARD or  IMMUNOTHERAPY ALERT CARD at check-in to the Emergency Department and triage nurse. ? ?Should you have questions after your visit or need to cancel or reschedule your appointment, please contact [facility] [place] AT [place]  [phone] and follow the prompts.  Office hours are 8:00 a.m. to 4:30 p.m. Monday - Friday. Please note that voicemails left after 4:00 p.m. may not be returned until the following business day.  We are closed weekends and major holidays. You have access to a nurse at all times for urgent questions. Please call the main number to the clinic [phone] and follow the prompts. ? ?For any non-urgent questions, you may also contact your provider using MyChart. We now offer e-Visits for anyone 74 and older to request care online for non-urgent symptoms. For details visit [email or web address]. ?  ?Also download the MyChart app! Go to the app store, search "MyChart", open the app, select , and log in with your MyChart username and password. ? ?Due to Covid, a mask is required upon entering the hospital/clinic. If you do not have a mask, one will be given to you upon arrival. For doctor visits, patients may have 1 support person aged 32 or older with them. For treatment visits, patients cannot have anyone with them due to current Covid guidelines and our immunocompromised population.  ? ?Paclitaxel injection ?What is this medication? ?PACLITAXEL (PAK li TAX el) is a chemotherapy drug. It targets fast dividing cells, like cancer cells, and causes these cells to die. This medicine is used to treat ovarian cancer, breast cancer, lung cancer, Kaposi's sarcoma, and other cancers. ?This medicine may be used  for other purposes; ask your health care provider or pharmacist if you have questions. ?COMMON BRAND NAME(S): Onxol, Taxol ?What should I tell my care team before I take this medication? ?They need to know if you have any of these conditions: ?history of irregular  heartbeat ?liver disease ?low blood counts, like low white cell, platelet, or red cell counts ?lung or breathing disease, like asthma ?tingling of the fingers or toes, or other nerve disorder ?an unusual or allergic reaction to paclitaxel, alcohol, polyoxyethylated castor oil, other chemotherapy, other medicines, foods, dyes, or preservatives ?pregnant or trying to get pregnant ?breast-feeding ?How should I use this medication? ?This drug is given as an infusion into a vein. It is administered in a hospital or clinic by a specially trained health care professional. ?Talk to your pediatrician regarding the use of this medicine in children. Special care may be needed. ?Overdosage: If you think you have taken too much of this medicine contact a poison control center or emergency room at once. ?NOTE: This medicine is only for you. Do not share this medicine with others. ?What if I miss a dose? ?It is important not to miss your dose. Call your doctor or health care professional if you are unable to keep an appointment. ?What may interact with this medication? ?Do not take this medicine with any of the following medications: ?live virus vaccines ?This medicine may also interact with the following medications: ?antiviral medicines for hepatitis, HIV or AIDS ?certain antibiotics like erythromycin and clarithromycin ?certain medicines for fungal infections like ketoconazole and itraconazole ?certain medicines for seizures like carbamazepine, phenobarbital, phenytoin ?gemfibrozil ?nefazodone ?rifampin ?St. John's wort ?This list may not describe all possible interactions. Give your health care provider a list of all the medicines, herbs, non-prescription drugs, or dietary supplements you use. Also tell them if you smoke, drink alcohol, or use illegal drugs. Some items may interact with your medicine. ?What should I watch for while using this medication? ?Your condition will be monitored carefully while you are receiving this  medicine. You will need important blood work done while you are taking this medicine. ?This medicine can cause serious allergic reactions. To reduce your risk you will need to take other medicine(s) before treatment with this medicine. If you experience allergic reactions like skin rash, itching or hives, swelling of the face, lips, or tongue, tell your doctor or health care professional right away. ?In some cases, you may be given additional medicines to help with side effects. Follow all directions for their use. ?This drug may make you feel generally unwell. This is not uncommon, as chemotherapy can affect healthy cells as well as cancer cells. Report any side effects. Continue your course of treatment even though you feel ill unless your doctor tells you to stop. ?Call your doctor or health care professional for advice if you get a fever, chills or sore throat, or other symptoms of a cold or flu. Do not treat yourself. This drug decreases your body's ability to fight infections. Try to avoid being around people who are sick. ?This medicine may increase your risk to bruise or bleed. Call your doctor or health care professional if you notice any unusual bleeding. ?Be careful brushing and flossing your teeth or using a toothpick because you may get an infection or bleed more easily. If you have any dental work done, tell your dentist you are receiving this medicine. ?Avoid taking products that contain aspirin, acetaminophen, ibuprofen, naproxen, or ketoprofen unless instructed by your doctor. These  medicines may hide a fever. ?Do not become pregnant while taking this medicine. Women should inform their doctor if they wish to become pregnant or think they might be pregnant. There is a potential for serious side effects to an unborn child. Talk to your health care professional or pharmacist for more information. Do not breast-feed an infant while taking this medicine. ?Men are advised not to father a child while  receiving this medicine. ?This product may contain alcohol. Ask your pharmacist or healthcare provider if this medicine contains alcohol. Be sure to tell all healthcare providers you are taking this medicine. Certain medicin

## 2022-03-02 NOTE — Progress Notes (Signed)
? ? ? ?Hematology/Oncology Consult note ?[facility]  ?[phone] [phone] ? ?Patient Care Team: ?Adin Hector, MD as PCP - General (Internal Medicine) ?Rockwell Germany, RN as Oncology Nurse Navigator ?Mauro Kaufmann, RN as Oncology Nurse Navigator ?Rolm Bookbinder, MD as Consulting Physician (General Surgery) ?Eppie Gibson, MD as Attending Physician (Radiation Oncology) ?Rubie Maid, MD as Consulting Physician (Obstetrics and Gynecology) ?Clent Jacks, RN as Oncology Nurse Navigator ?Sindy Guadeloupe, MD as Consulting Physician (Oncology)  ? ?Name of the patient: Elizabeth Friedman  ?[id number]  ?14-Apr-1941  ? ?Date of visit: 03/02/22 ? 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 ?Chief complaint/ Reason for visit-on treatment assessment prior to cycle 3 of CarboTaxol chemotherapy ? ?Heme/Onc history: Patient is a 81 year old female who was having pelvic pain symptoms for a month.  Initially she saw GI but given her history of pessary placement for 20 years she decided to see GYN.  She had an ultrasound pelvis which showed mixed echogenicity mass in the endometrial canal measuring 3.7 x 4.1 x 3.3 cm.  She was also having some vaginal spotting and brown discharge.  She underwent hysteroscopy with removal of endometrial mass and D&C which showed carcinosarcoma/malignant mixed m?llerian tumor. ?  ?CT chest abdomen and pelvis showed enhancing lesion in the anterior fundus but no other evidence of distant metastatic disease in chest abdomen and pelvis. ?  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  She was following up with Dr. Jana Hakim at [facility] previously.  She is not on any endocrine therapy for the same. ?  ?Patient underwent total laparoscopic hysterectomy with bilateral salpingo-oophorectomy and bilateral pelvic sentinel lymph node injection mapping and biopsies on  12/24/2021.  Final pathology showed a 3.9 cm carcinosarcoma.  Less than 50% myometrial invasion.  Uterine serosa not identified.  Malignant cells were present in the peritoneal/ascites fluid.  Lymphatic invasion not identified.  Margins negative.  2 sentinel lymph nodes on the right and left iliac were negative for malignancy.  No evidence of malignancy in the fallopian tubes.  Cystadenofibroma noted in the ovaries pT1a pN0 FIGO stage IA ?  ?Plan is to proceed with adjuvant CarboTaxol chemotherapy every 3 weeks x6 cycles if patient can tolerate it especially given positive pelvic washings.  Followed by vaginal brachytherapy ?  ? ?Interval history-tolerated chemotherapy well so far.  Neuropathy is overall stable after dose reduction.  Denies any worsening tingling numbness in her hands and feet.  She did use ice packs during chemotherapy last time.  Denies any falls.  She does have some aches and pains after receiving Udenyca for which she uses Tylenol and Claritin. ? ?ECOG PS- 1 ?Pain scale- 0 ? ? ?Review of systems- Review of Systems  ?Constitutional:  Positive for malaise/fatigue. Negative for chills, fever and weight loss.  ?HENT:  Negative for congestion, ear discharge and nosebleeds.   ?Eyes:  Negative for blurred vision.  ?Respiratory:  Negative for cough, hemoptysis, sputum production, shortness of breath and wheezing.   ?Cardiovascular:  Negative for chest pain, palpitations, orthopnea and claudication.  ?Gastrointestinal:  Negative for abdominal pain, blood in stool, constipation, diarrhea, heartburn, melena, nausea and vomiting.  ?Genitourinary:  Negative for dysuria, flank pain, frequency, hematuria and urgency.  ?Musculoskeletal:  Negative for back pain, joint pain and myalgias.  ?Skin:  Negative for rash.  ?Neurological:  Positive for sensory change (Peripheral neuropathy). Negative  for dizziness, tingling, focal weakness, seizures, weakness and headaches.  ?Endo/Heme/Allergies:  Does not bruise/bleed  easily.  ?Psychiatric/Behavioral:  Negative for depression and suicidal ideas. The patient does not have insomnia.    ? ? ? ?No Known Allergies ? ? ?Past Medical History:  ?Diagnosis Date  ? Allergic rhinitis   ? Anemia   ? Anginal pain ([place])   ? Arthritis   ? Atypical chest pain   ? Breast cancer ([place])   ? 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[place])   ? Ductal carcinoma in situ (DCIS) of right breast   ? Fibrocystic breast disease   ? GERD (gastroesophageal reflux disease)   ? Hearing aid worn   ? bilateral  ? History of hiatal hernia   ? Hyperlipidemia   ? Lung nodule   ? Menopause   ? Migraine   ? Migraine   ? optical migraines  ? Motion sickness   ? Plantar fasciitis   ? Prolapse of female pelvic organs   ? hodge pessary  ? Raynaud's disease   ? Sciatic leg pain   ? Sciatica   ? Skin cancer of face   ? Urinary retention with incomplete bladder emptying   ? Uterine carcinosarcoma ([place]) 11/2021  ? Vitreous detachment of both eyes   ? ? ? ?Past Surgical History:  ?Procedure Laterality Date  ? APPENDECTOMY  1951  ? BREAST LUMPECTOMY WITH RADIOACTIVE SEED LOCALIZATION Right 05/29/2020  ? Procedure: RIGHT BREAST LUMPECTOMY WITH RADIOACTIVE SEED LOCALIZATION;  Surgeon: Rolm Bookbinder, MD;  Location: [facility];  Service: General;  Laterality: Right;  ? CARDIAC CATHETERIZATION  2004  ? negative  ? CATARACT EXTRACTION W/PHACO Right 02/26/2021  ? Procedure: CATARACT EXTRACTION PHACO AND INTRAOCULAR LENS PLACEMENT (I[facility]) RIGHT 5.86 01:19.6 7.4%;  Surgeon: Leandrew Koyanagi, MD;  Location: [facility];  Service: Ophthalmology;  Laterality: Right;  ? CATARACT EXTRACTION W/PHACO Left 09/03/2021  ? Procedure: CATARACT EXTRACTION PHACO AND INTRAOCULAR LENS PLACEMENT ([place]) LEFT;  Surgeon: Leandrew Koyanagi, MD;  Location: [facility];  Service: Ophthalmology;  Laterality: Left;  9.61 ?01:15.9  ? COLONOSCOPY    ? 2000, 2006, 2012  ? COLONOSCOPY WITH PROPOFOL N/A 03/27/2016  ? Procedure: COLONOSCOPY WITH PROPOFOL;   Surgeon: Manya Silvas, MD;  Location: [facility];  Service: Endoscopy;  Laterality: N/A;  ? COLONOSCOPY WITH PROPOFOL N/A 08/14/2020  ? Procedure: COLONOSCOPY WITH PROPOFOL;  Surgeon: Toledo, Benay Pike, MD;  Location: [facility] ENDOSCOPY;  Service: Gastroenterology;  Laterality: N/A;  ? CYSTOSCOPY N/A 12/24/2021  ? Procedure: CYSTOSCOPY;  Surgeon: Jaquita Folds, MD;  Location: [facility];  Service: Gynecology;  Laterality: N/A;  ? DILATATION & CURETTAGE/HYSTEROSCOPY WITH MYOSURE N/A 12/01/2021  ? Procedure: [place];  Surgeon: Rubie Maid, MD;  Location: [facility];  Service: Gynecology;  Laterality: N/A;  ? ESOPHAGOGASTRODUODENOSCOPY  08/1999  ? ESOPHAGOGASTRODUODENOSCOPY (EGD) WITH PROPOFOL N/A 08/14/2020  ? Procedure: ESOPHAGOGASTRODUODENOSCOPY (EGD) WITH PROPOFOL;  Surgeon: Toledo, Benay Pike, MD;  Location: [facility];  Service: Gastroenterology;  Laterality: N/A;  ? ESOPHAGOGASTRODUODENOSCOPY (EGD) WITH PROPOFOL N/A 03/21/2021  ? Procedure: ESOPHAGOGASTRODUODENOSCOPY (EGD) WITH PROPOFOL;  Surgeon: Toledo, Benay Pike, MD;  Location: [facility];  Service: Gastroenterology;  Laterality: N/A;  ? PORTA CATH INSERTION N/A 01/15/2022  ? Procedure: PORTA CATH INSERTION;  Surgeon: Algernon Huxley, MD;  Location: [facility];  Service: Cardiovascular;  Laterality: N/A;  ? SKIN CANCER EXCISION    ? face and neck  ? TOTAL LAPAROSCOPIC HYSTERECTOMY WITH BILATERAL SALPINGO OOPHORECTOMY  Bilateral 12/24/2021  ? Procedure: TOTAL LAPAROSCOPIC HYSTERECTOMY WITH BILATERAL SALPINGO OOPHORECTOMY, PELVIC NODE BIOPSY,  PESSARY REMOVAL (PESSARY REMOVAL BY DR. Wannetta Sender;  Surgeon: Mellody Drown, MD;  Location: [facility];  Service: Gynecology;  Laterality: Bilateral;  ? ? ?Social History  ? ?Socioeconomic History  ? Marital status: Widowed  ?  Spouse name: Not on file  ? Number of children: Not on file  ? Years of education: Not on file  ? Highest education level: Not on file   ?Occupational History  ? Not on file  ?Tobacco Use  ? Smoking status: Never  ? Smokeless tobacco: Never  ?Vaping Use  ? Vaping Use: Never used  ?Substance and Sexual Activity  ? Alcohol use: No  ? Drug use: No

## 2022-03-02 NOTE — Progress Notes (Signed)
Pt states that she has been dealing with congestion going on three weeks and has been coughing up some yellow phelm; but not having any problems from it.  ? ?

## 2022-03-02 NOTE — Progress Notes (Signed)
Patient noted to have small hole to port incision site upon accessing port. Erythema was present just surrounding the hole and patient reports area is tender but states tenderness has been present since having the port placed. No drainage noted to the area. Dr. Janese Banks and team made aware. Port accessed and functionally appropriately.  ?

## 2022-03-04 ENCOUNTER — Inpatient Hospital Stay: Payer: PPO

## 2022-03-04 DIAGNOSIS — C541 Malignant neoplasm of endometrium: Secondary | ICD-10-CM

## 2022-03-04 DIAGNOSIS — Z5111 Encounter for antineoplastic chemotherapy: Secondary | ICD-10-CM | POA: Diagnosis not present

## 2022-03-04 MED ORDER — PEGFILGRASTIM-CBQV 6 MG/0.6ML ~~LOC~~ SOSY
6.0000 mg | PREFILLED_SYRINGE | Freq: Once | SUBCUTANEOUS | Status: AC
  Administered 2022-03-04: 6 mg via SUBCUTANEOUS
  Filled 2022-03-04: qty 0.6

## 2022-03-19 DIAGNOSIS — E538 Deficiency of other specified B group vitamins: Secondary | ICD-10-CM | POA: Diagnosis not present

## 2022-03-19 DIAGNOSIS — Z0000 Encounter for general adult medical examination without abnormal findings: Secondary | ICD-10-CM | POA: Diagnosis not present

## 2022-03-19 DIAGNOSIS — E785 Hyperlipidemia, unspecified: Secondary | ICD-10-CM | POA: Diagnosis not present

## 2022-03-20 MED FILL — Dexamethasone Sodium Phosphate Inj 100 MG/10ML: INTRAMUSCULAR | Qty: 1 | Status: AC

## 2022-03-20 MED FILL — Fosaprepitant Dimeglumine For IV Infusion 150 MG (Base Eq): INTRAVENOUS | Qty: 5 | Status: AC

## 2022-03-24 ENCOUNTER — Encounter: Payer: Self-pay | Admitting: Oncology

## 2022-03-24 ENCOUNTER — Inpatient Hospital Stay: Payer: PPO

## 2022-03-24 ENCOUNTER — Inpatient Hospital Stay (HOSPITAL_BASED_OUTPATIENT_CLINIC_OR_DEPARTMENT_OTHER): Payer: PPO | Admitting: Oncology

## 2022-03-24 VITALS — BP 138/72 | HR 77 | Temp 97.3°F | Resp 16 | Wt 101.4 lb

## 2022-03-24 DIAGNOSIS — Z5111 Encounter for antineoplastic chemotherapy: Secondary | ICD-10-CM

## 2022-03-24 DIAGNOSIS — C541 Malignant neoplasm of endometrium: Secondary | ICD-10-CM

## 2022-03-24 LAB — CBC WITH DIFFERENTIAL/PLATELET
Abs Immature Granulocytes: 0.02 10*3/uL (ref 0.00–0.07)
Basophils Absolute: 0.1 10*3/uL (ref 0.0–0.1)
Basophils Relative: 1 %
Eosinophils Absolute: 0 10*3/uL (ref 0.0–0.5)
Eosinophils Relative: 0 %
HCT: 30.3 % — ABNORMAL LOW (ref 36.0–46.0)
Hemoglobin: 10.2 g/dL — ABNORMAL LOW (ref 12.0–15.0)
Immature Granulocytes: 0 %
Lymphocytes Relative: 12 %
Lymphs Abs: 0.9 10*3/uL (ref 0.7–4.0)
MCH: 32.1 pg (ref 26.0–34.0)
MCHC: 33.7 g/dL (ref 30.0–36.0)
MCV: 95.3 fL (ref 80.0–100.0)
Monocytes Absolute: 0.9 10*3/uL (ref 0.1–1.0)
Monocytes Relative: 11 %
Neutro Abs: 6.1 10*3/uL (ref 1.7–7.7)
Neutrophils Relative %: 76 %
Platelets: 240 10*3/uL (ref 150–400)
RBC: 3.18 MIL/uL — ABNORMAL LOW (ref 3.87–5.11)
RDW: 17.3 % — ABNORMAL HIGH (ref 11.5–15.5)
WBC: 8 10*3/uL (ref 4.0–10.5)
nRBC: 0 % (ref 0.0–0.2)

## 2022-03-24 LAB — COMPREHENSIVE METABOLIC PANEL
ALT: 26 U/L (ref 0–44)
AST: 27 U/L (ref 15–41)
Albumin: 3.7 g/dL (ref 3.5–5.0)
Alkaline Phosphatase: 120 U/L (ref 38–126)
Anion gap: 7 (ref 5–15)
BUN: 20 mg/dL (ref 8–23)
CO2: 21 mmol/L — ABNORMAL LOW (ref 22–32)
Calcium: 8.8 mg/dL — ABNORMAL LOW (ref 8.9–10.3)
Chloride: 104 mmol/L (ref 98–111)
Creatinine, Ser: 0.86 mg/dL (ref 0.44–1.00)
GFR, Estimated: >60 mL/min (ref >60)
Glucose, Bld: 121 mg/dL — ABNORMAL HIGH (ref 70–99)
Potassium: 4.5 mmol/L (ref 3.5–5.1)
Sodium: 132 mmol/L — ABNORMAL LOW (ref 135–145)
Total Bilirubin: 0.3 mg/dL (ref 0.3–1.2)
Total Protein: 6.3 g/dL — ABNORMAL LOW (ref 6.5–8.1)

## 2022-03-24 MED ORDER — FAMOTIDINE IN NACL 20-0.9 MG/50ML-% IV SOLN
20.0000 mg | Freq: Once | INTRAVENOUS | Status: AC
  Administered 2022-03-24: 20 mg via INTRAVENOUS
  Filled 2022-03-24: qty 50

## 2022-03-24 MED ORDER — PALONOSETRON HCL INJECTION 0.25 MG/5ML
0.2500 mg | Freq: Once | INTRAVENOUS | Status: AC
  Administered 2022-03-24: 0.25 mg via INTRAVENOUS
  Filled 2022-03-24: qty 5

## 2022-03-24 MED ORDER — SODIUM CHLORIDE 0.9 % IV SOLN
Freq: Once | INTRAVENOUS | Status: AC
  Filled 2022-03-24: qty 250

## 2022-03-24 MED ORDER — SODIUM CHLORIDE 0.9 % IV SOLN
135.0000 mg/m2 | Freq: Once | INTRAVENOUS | Status: AC
  Administered 2022-03-24: 192 mg via INTRAVENOUS
  Filled 2022-03-24: qty 32

## 2022-03-24 MED ORDER — HEPARIN SOD (PORK) LOCK FLUSH 100 UNIT/ML IV SOLN
500.0000 [IU] | Freq: Once | INTRAVENOUS | Status: AC | PRN
  Administered 2022-03-24: 500 [IU]
  Filled 2022-03-24: qty 5

## 2022-03-24 MED ORDER — SODIUM CHLORIDE 0.9 % IV SOLN
10.0000 mg | Freq: Once | INTRAVENOUS | Status: AC
  Administered 2022-03-24: 10 mg via INTRAVENOUS
  Filled 2022-03-24: qty 10

## 2022-03-24 MED ORDER — SODIUM CHLORIDE 0.9% FLUSH
10.0000 mL | Freq: Once | INTRAVENOUS | Status: AC
  Administered 2022-03-24: 10 mL via INTRAVENOUS
  Filled 2022-03-24: qty 10

## 2022-03-24 MED ORDER — DIPHENHYDRAMINE HCL 50 MG/ML IJ SOLN
50.0000 mg | Freq: Once | INTRAMUSCULAR | Status: AC
  Administered 2022-03-24: 50 mg via INTRAVENOUS
  Filled 2022-03-24: qty 1

## 2022-03-24 MED ORDER — SODIUM CHLORIDE 0.9 % IV SOLN
150.0000 mg | Freq: Once | INTRAVENOUS | Status: AC
  Administered 2022-03-24: 150 mg via INTRAVENOUS
  Filled 2022-03-24: qty 150

## 2022-03-24 MED ORDER — SODIUM CHLORIDE 0.9 % IV SOLN
230.4000 mg | Freq: Once | INTRAVENOUS | Status: AC
  Administered 2022-03-24: 230 mg via INTRAVENOUS
  Filled 2022-03-24: qty 23

## 2022-03-24 NOTE — Progress Notes (Signed)
Hematology/Oncology Consult note [facility]  [phone] [phone]  Patient Care Team: Adin Hector, MD as PCP - General (Internal Medicine) Rockwell Germany, RN as Oncology Nurse Navigator Mauro Kaufmann, RN as Oncology Nurse Navigator Rolm Bookbinder, MD as Consulting Physician (General Surgery) Eppie Gibson, MD as Attending Physician (Radiation Oncology) Rubie Maid, MD as Consulting Physician (Obstetrics and Gynecology) Clent Jacks, RN as Oncology Nurse Navigator Sindy Guadeloupe, MD as Consulting Physician (Oncology)   Name of the patient: Elizabeth Friedman  [id number]  12-31-1940   Date of visit: 03/24/22  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  Initially she saw GI but given her history of pessary placement for 20 years she decided to see GYN.  She had an ultrasound pelvis which showed mixed echogenicity mass in the endometrial canal measuring 3.7 x 4.1 x 3.3 cm.  She was also having some vaginal spotting and brown discharge.  She underwent hysteroscopy with removal of endometrial mass and D&C which showed carcinosarcoma/malignant mixed mllerian tumor.   CT chest abdomen and pelvis showed enhancing lesion in the anterior fundus but no other evidence of distant metastatic disease in chest abdomen and pelvis.   Patient also has a prior history of right breast DCIS s/p lumpectomy and radiation treatment in 2021.  She was following up with Dr. Jana Hakim at [place] previously.  She is not on any endocrine therapy for the same.   Patient underwent total laparoscopic hysterectomy with bilateral salpingo-oophorectomy and bilateral pelvic sentinel lymph node injection mapping and biopsies on  12/24/2021.  Final pathology showed a 3.9 cm carcinosarcoma.  Less than 50% myometrial invasion.  Uterine serosa not identified.  Malignant cells were present in the peritoneal/ascites fluid.  Lymphatic invasion not identified.  Margins negative.  2 sentinel lymph nodes on the right and left iliac were negative for malignancy.  No evidence of malignancy in the fallopian tubes.  Cystadenofibroma noted in the ovaries pT1a pN0 FIGO stage IA   Plan is to proceed with adjuvant CarboTaxol chemotherapy every 3 weeks x6 cycles if patient can tolerate it especially given positive pelvic washings.  Followed by vaginal brachytherapy      Interval history-she does report some more fatigue with further chemotherapy but is able to maintain her ADLs and IADLs.  Neuropathy has remained stable overall  ECOG PS- 1 Pain scale- 0   Review of systems- Review of Systems  Constitutional:  Positive for malaise/fatigue. Negative for chills, fever and weight loss.  HENT:  Negative for congestion, ear discharge and nosebleeds.   Eyes:  Negative for blurred vision.  Respiratory:  Negative for cough, hemoptysis, sputum production, shortness of breath and wheezing.   Cardiovascular:  Negative for chest pain, palpitations, orthopnea and claudication.  Gastrointestinal:  Negative for abdominal pain, blood in stool, constipation, diarrhea, heartburn, melena, nausea and vomiting.  Genitourinary:  Negative for dysuria, flank pain, frequency, hematuria and urgency.  Musculoskeletal:  Negative for back pain, joint pain and myalgias.  Skin:  Negative for rash.  Neurological:  Positive for sensory change (Peripheral neuropathy). Negative for dizziness, tingling, focal weakness, seizures, weakness and headaches.  Endo/Heme/Allergies:  Does not bruise/bleed easily.  Psychiatric/Behavioral:  Negative for depression and suicidal ideas. The patient does not have  insomnia.     No Known Allergies   Past Medical History:  Diagnosis  Date   Allergic rhinitis    Anemia    Anginal pain (HCC)    Arthritis    Atypical chest pain    Breast cancer ([place])    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[place])    Ductal carcinoma in situ (DCIS) of right breast    Fibrocystic breast disease    GERD (gastroesophageal reflux disease)    Hearing aid worn    bilateral   History of hiatal hernia    Hyperlipidemia    Lung nodule    Menopause    Migraine    Migraine    optical migraines   Motion sickness    Plantar fasciitis    Prolapse of female pelvic organs    hodge pessary   Raynaud's disease    Sciatic leg pain    Sciatica    Skin cancer of face    Urinary retention with incomplete bladder emptying    Uterine carcinosarcoma ([place]) 11/2021   Vitreous detachment of both eyes      Past Surgical History:  Procedure Laterality Date   APPENDECTOMY  1951   BREAST LUMPECTOMY WITH RADIOACTIVE SEED LOCALIZATION Right 05/29/2020   Procedure: RIGHT BREAST LUMPECTOMY WITH RADIOACTIVE SEED LOCALIZATION;  Surgeon: Rolm Bookbinder, MD;  Location: [place];  Service: General;  Laterality: Right;   CARDIAC CATHETERIZATION  2004   negative   CATARACT EXTRACTION W/PHACO Right 02/26/2021   Procedure: CATARACT EXTRACTION PHACO AND INTRAOCULAR LENS PLACEMENT (IOC) RIGHT 5.86 01:19.6 7.4%;  Surgeon: Leandrew Koyanagi, MD;  Location: [place];  Service: Ophthalmology;  Laterality: Right;   CATARACT EXTRACTION W/PHACO Left 09/03/2021   Procedure: CATARACT EXTRACTION PHACO AND INTRAOCULAR LENS PLACEMENT ([place]) LEFT;  Surgeon: Leandrew Koyanagi, MD;  Location: [place];  Service: Ophthalmology;  Laterality: Left;  9.61 01:15.9   COLONOSCOPY     2000, 2006, 2012   COLONOSCOPY WITH PROPOFOL N/A 03/27/2016   Procedure: COLONOSCOPY WITH PROPOFOL;  Surgeon: Manya Silvas, MD;  [facility] ENDOSCOPY;  Service: Endoscopy;  Laterality: N/A;   COLONOSCOPY WITH PROPOFOL N/A 08/14/2020   Procedure: COLONOSCOPY WITH PROPOFOL;   Surgeon: Toledo, Benay Pike, MD;  Location: [facility] ENDOSCOPY;  Service: Gastroenterology;  Laterality: N/A;   CYSTOSCOPY N/A 12/24/2021   Procedure: CYSTOSCOPY;  Surgeon: Jaquita Folds, MD;  Location: [facility] ORS;  Service: Gynecology;  Laterality: N/A;   DILATATION & CURETTAGE/HYSTEROSCOPY WITH MYOSURE N/A 12/01/2021   Procedure: DILATATION & CURETTAGE/HYSTEROSCOPY WITH MYOSURE;  Surgeon: Rubie Maid, MD;  Location: [facility] ORS;  Service: Gynecology;  Laterality: N/A;   ESOPHAGOGASTRODUODENOSCOPY  08/1999   ESOPHAGOGASTRODUODENOSCOPY (EGD) WITH PROPOFOL N/A 08/14/2020   Procedure: ESOPHAGOGASTRODUODENOSCOPY (EGD) WITH PROPOFOL;  Surgeon: Toledo, Benay Pike, MD;  Location: [facility] ENDOSCOPY;  Service: Gastroenterology;  Laterality: N/A;   ESOPHAGOGASTRODUODENOSCOPY (EGD) WITH PROPOFOL N/A 03/21/2021   Procedure: ESOPHAGOGASTRODUODENOSCOPY (EGD) WITH PROPOFOL;  Surgeon: Toledo, Benay Pike, MD;  Location: [facility] ENDOSCOPY;  Service: Gastroenterology;  Laterality: N/A;   PORTA CATH INSERTION N/A 01/15/2022   Procedure: PORTA CATH INSERTION;  Surgeon: Algernon Huxley, MD;  Location: [place] CV LAB;  Service: Cardiovascular;  Laterality: N/A;   SKIN CANCER EXCISION     face and neck   TOTAL LAPAROSCOPIC HYSTERECTOMY WITH BILATERAL SALPINGO OOPHORECTOMY Bilateral 12/24/2021   Procedure: TOTAL LAPAROSCOPIC HYSTERECTOMY WITH BILATERAL SALPINGO OOPHORECTOMY, PELVIC NODE BIOPSY,  PESSARY REMOVAL (PESSARY REMOVAL BY DR. Wannetta Sender;  Surgeon: Mellody Drown, MD;  Location: [facility] ORS;  Service: Gynecology;  Laterality: Bilateral;    Social History   Socioeconomic History   Marital status: Widowed    Spouse name: Not on file   Number of children: Not on file   Years of education: Not on file   Highest education level: Not on file  Occupational History   Not on file  Tobacco Use   Smoking status: Never   Smokeless tobacco: Never  Vaping Use   Vaping Use: Never used  Substance and Sexual Activity    Alcohol use: No   Drug use: No   Sexual activity: Not Currently    Birth control/protection: Post-menopausal  Other Topics Concern   Not on file  Social History Narrative   Lives alone   Social Determinants of Health   Financial Resource Strain: Not on file  Food Insecurity: Not on file  Transportation Needs: Not on file  Physical Activity: Not on file  Stress: Not on file  Social Connections: Not on file  Intimate Partner Violence: Not on file    Family History  Problem Relation Age of Onset   Breast cancer Mother    Heart disease Father    Colon cancer Brother    Diabetes Neg Hx    Ovarian cancer Neg Hx      Current Outpatient Medications:    acetaminophen (TYLENOL) 500 MG tablet, Take 1,000 mg by mouth every 6 (six) hours as needed for moderate pain or mild pain., Disp: , Rfl:    alum & mag hydroxide-simeth (MAALOX/MYLANTA) 200-200-20 MG/5ML suspension, Take 30 mLs by mouth every 6 (six) hours as needed for indigestion or heartburn., Disp: , Rfl:    Calcium-Vitamin D (CALTRATE 600 PLUS-VIT D PO), Take 1 tablet by mouth in the morning., Disp: , Rfl:    Carboxymethylcellulose Sod PF 1 % GEL, Place 1 drop into both eyes at bedtime., Disp: , Rfl:    celecoxib (CELEBREX) 200 MG capsule, Take 200 mg by mouth 2 (two) times daily., Disp: , Rfl:    Cholecalciferol (VITAMIN D) 125 MCG (5000 UT) CAPS, Take 5,000 Units by mouth in the morning., Disp: , Rfl:    Coenzyme Q10 200 MG TABS, Take 200 mg by mouth in the morning., Disp: , Rfl:    Cyanocobalamin (VITAMIN B-12 PO), Take 1 tablet by mouth in the morning., Disp: , Rfl:    dexamethasone (DECADRON) 4 MG tablet, Take 2 tablets by mouth once a day starting the day after chemotherapy. Continue for 3 days total. Take with food., Disp: 30 tablet, Rfl: 1   gabapentin (NEURONTIN) 300 MG capsule, Take 1 capsule (300 mg total) by mouth 2 (two) times daily., Disp: 60 capsule, Rfl: 0   Iron-Vitamin C (VITRON-C) 65-125 MG TABS, Take 1 tablet  by mouth in the morning., Disp: , Rfl:    lidocaine-prilocaine (EMLA) cream, Apply to affected area once, Disp: 30 g, Rfl: 3   Omega-3 Fatty Acids (FISH OIL PO), Take 1,400 mg by mouth in the morning., Disp: , Rfl:    pantoprazole (PROTONIX) 40 MG tablet, Take 40 mg by mouth in the morning., Disp: , Rfl:    RESTASIS 0.05 % ophthalmic emulsion, Place 1 drop into both eyes 2 (two) times daily., Disp: , Rfl:    simvastatin (ZOCOR) 10 MG tablet, Take 10 mg by mouth every evening., Disp: , Rfl:    topiramate (TOPAMAX) 50 MG tablet, Take 50 mg by mouth every evening., Disp: , Rfl:    vitamin E 180 MG (400 UNITS)  capsule, Take 400 Units by mouth in the morning., Disp: , Rfl:    LORazepam (ATIVAN) 0.5 MG tablet, Take 1 tablet (0.5 mg total) by mouth every 6 (six) hours as needed (Nausea or vomiting). (Patient not taking: Reported on 03/02/2022), Disp: 30 tablet, Rfl: 0   omeprazole (PRILOSEC) 40 MG capsule, Take 40 mg by mouth daily in the afternoon. (Patient not taking: Reported on 03/02/2022), Disp: , Rfl:    ondansetron (ZOFRAN) 8 MG tablet, Take 1 tablet (8 mg total) by mouth 2 (two) times daily as needed. Start on the third day after chemotherapy. (Patient not taking: Reported on 02/09/2022), Disp: 30 tablet, Rfl: 1   prochlorperazine (COMPAZINE) 10 MG tablet, Take 1 tablet (10 mg total) by mouth every 6 (six) hours as needed (Nausea or vomiting). (Patient not taking: Reported on 02/09/2022), Disp: 30 tablet, Rfl: 1   Triamcinolone Acetonide (NASACORT ALLERGY 24HR NA), Place 1 spray into the nose daily as needed (sinus congestion). (Patient not taking: Reported on 03/02/2022), Disp: , Rfl:   Physical exam:  Vitals:   03/24/22 0852  BP: 138/72  Pulse: 77  Resp: 16  Temp: (!) 97.3 F (36.3 C)  SpO2: 98%  Weight: 101 lb 6.4 oz (46 kg)   Physical Exam Constitutional:      General: She is not in acute distress. Cardiovascular:     Rate and Rhythm: Normal rate and regular rhythm.     Heart sounds:  Normal heart sounds.  Pulmonary:     Effort: Pulmonary effort is normal.     Breath sounds: Normal breath sounds.  Skin:    General: Skin is warm and dry.  Neurological:     Mental Status: She is alert and oriented to person, place, and time.        Latest Ref Rng & Units 03/24/2022    8:33 AM  CMP  Glucose 70 - 99 mg/dL 121    BUN 8 - 23 mg/dL 20    Creatinine 0.44 - 1.00 mg/dL 0.86    Sodium 135 - 145 mmol/L 132    Potassium 3.5 - 5.1 mmol/L 4.5    Chloride 98 - 111 mmol/L 104    CO2 22 - 32 mmol/L 21    Calcium 8.9 - 10.3 mg/dL 8.8    Total Protein 6.5 - 8.1 g/dL 6.3    Total Bilirubin 0.3 - 1.2 mg/dL 0.3    Alkaline Phos 38 - 126 U/L 120    AST 15 - 41 U/L 27    ALT 0 - 44 U/L 26        Latest Ref Rng & Units 03/24/2022    8:33 AM  CBC  WBC 4.0 - 10.5 K/uL 8.0    Hemoglobin 12.0 - 15.0 g/dL 10.2    Hematocrit 36.0 - 46.0 % 30.3    Platelets 150 - 400 K/uL 240       Assessment and plan- Patient is a 81 y.o. female  with stage I apT1 aN0 M0 carcinosarcoma of the uterus.  She is here for on treatment assessment prior to cycle 4 of CarboTaxol chemotherapy  Patient does report some increasing fatigue but is otherwise tolerating chemotherapy well without any significant side effects.  She was able to mow her lawn and clean her house in the last couple of weeks.  Patient has chemo-induced anemia as well as a component of iron deficiency.  She has received 2 doses of Feraheme so far.  Hemoglobin is stable around 10.  Continue to monitor  Counts otherwise okay to proceed with cycle 4 CarboTaxol chemotherapy today with Udenyca on day 3 and I will see her back in 3 weeks for cycle 5   Visit Diagnosis 1. Encounter for antineoplastic chemotherapy   2. Endometrial cancer [facility])      Dr. Randa Evens, MD, MPH [facility] at [facility] [phone] 03/24/2022 4:42 PM

## 2022-03-24 NOTE — Patient Instructions (Signed)
[facility] CANCER [facility] AT [place]  Discharge Instructions: Thank you for choosing [facility] to provide your oncology and hematology care.  If you have a lab appointment with the [facility], please go directly to the [facility] and check in at the registration area.  Wear comfortable clothing and clothing appropriate for easy access to any Portacath or PICC line.   We strive to give you quality time with your provider. You may need to reschedule your appointment if you arrive late (15 or more minutes).  Arriving late affects you and other patients whose appointments are after yours.  Also, if you miss three or more appointments without notifying the office, you may be dismissed from the clinic at the provider's discretion.      For prescription refill requests, have your pharmacy contact our office and allow 72 hours for refills to be completed.    Today you received the following chemotherapy and/or immunotherapy agents: Carbo/Taxol   To help prevent nausea and vomiting after your treatment, we encourage you to take your nausea medication as directed.  BELOW ARE SYMPTOMS THAT SHOULD BE REPORTED IMMEDIATELY: *FEVER GREATER THAN 100.4 F (38 C) OR HIGHER *CHILLS OR SWEATING *NAUSEA AND VOMITING THAT IS NOT CONTROLLED WITH YOUR NAUSEA MEDICATION *UNUSUAL SHORTNESS OF BREATH *UNUSUAL BRUISING OR BLEEDING *URINARY PROBLEMS (pain or burning when urinating, or frequent urination) *BOWEL PROBLEMS (unusual diarrhea, constipation, pain near the anus) TENDERNESS IN MOUTH AND THROAT WITH OR WITHOUT PRESENCE OF ULCERS (sore throat, sores in mouth, or a toothache) UNUSUAL RASH, SWELLING OR PAIN  UNUSUAL VAGINAL DISCHARGE OR ITCHING   Items with * indicate a potential emergency and should be followed up as soon as possible or go to the Emergency Department if any problems should occur.  Please show the CHEMOTHERAPY ALERT CARD or IMMUNOTHERAPY ALERT CARD at check-in to  the Emergency Department and triage nurse.  Should you have questions after your visit or need to cancel or reschedule your appointment, please contact [facility] CANCER [place] AT [place]  [phone] and follow the prompts.  Office hours are 8:00 a.m. to 4:30 p.m. Monday - Friday. Please note that voicemails left after 4:00 p.m. may not be returned until the following business day.  We are closed weekends and major holidays. You have access to a nurse at all times for urgent questions. Please call the main number to the clinic [phone] and follow the prompts.  For any non-urgent questions, you may also contact your provider using MyChart. We now offer e-Visits for anyone 83 and older to request care online for non-urgent symptoms. For details visit [email or web address].   Also download the MyChart app! Go to the app store, search "MyChart", open the app, select [facility], and log in with your MyChart username and password.  Due to Covid, a mask is required upon entering the hospital/clinic. If you do not have a mask, one will be given to you upon arrival. For doctor visits, patients may have 1 support person aged 19 or older with them. For treatment visits, patients cannot have anyone with them due to current Covid guidelines and our immunocompromised population.

## 2022-03-25 ENCOUNTER — Encounter: Payer: Self-pay | Admitting: Nurse Practitioner

## 2022-03-25 ENCOUNTER — Encounter: Payer: Self-pay | Admitting: Oncology

## 2022-03-26 ENCOUNTER — Ambulatory Visit: Payer: PPO

## 2022-03-26 ENCOUNTER — Inpatient Hospital Stay: Payer: PPO | Attending: Obstetrics and Gynecology

## 2022-03-26 DIAGNOSIS — T451X5A Adverse effect of antineoplastic and immunosuppressive drugs, initial encounter: Secondary | ICD-10-CM | POA: Insufficient documentation

## 2022-03-26 DIAGNOSIS — E785 Hyperlipidemia, unspecified: Secondary | ICD-10-CM | POA: Diagnosis not present

## 2022-03-26 DIAGNOSIS — J479 Bronchiectasis, uncomplicated: Secondary | ICD-10-CM | POA: Diagnosis not present

## 2022-03-26 DIAGNOSIS — G620 Drug-induced polyneuropathy: Secondary | ICD-10-CM | POA: Diagnosis not present

## 2022-03-26 DIAGNOSIS — C541 Malignant neoplasm of endometrium: Secondary | ICD-10-CM | POA: Insufficient documentation

## 2022-03-26 DIAGNOSIS — Z5111 Encounter for antineoplastic chemotherapy: Secondary | ICD-10-CM | POA: Insufficient documentation

## 2022-03-26 DIAGNOSIS — D6481 Anemia due to antineoplastic chemotherapy: Secondary | ICD-10-CM | POA: Insufficient documentation

## 2022-03-26 DIAGNOSIS — Z79899 Other long term (current) drug therapy: Secondary | ICD-10-CM | POA: Diagnosis not present

## 2022-03-26 DIAGNOSIS — J8410 Pulmonary fibrosis, unspecified: Secondary | ICD-10-CM | POA: Diagnosis not present

## 2022-03-26 DIAGNOSIS — E538 Deficiency of other specified B group vitamins: Secondary | ICD-10-CM | POA: Diagnosis not present

## 2022-03-26 DIAGNOSIS — K219 Gastro-esophageal reflux disease without esophagitis: Secondary | ICD-10-CM | POA: Diagnosis not present

## 2022-03-26 MED ORDER — PEGFILGRASTIM-CBQV 6 MG/0.6ML ~~LOC~~ SOSY
6.0000 mg | PREFILLED_SYRINGE | Freq: Once | SUBCUTANEOUS | Status: AC
  Administered 2022-03-26: 6 mg via SUBCUTANEOUS
  Filled 2022-03-26: qty 0.6

## 2022-04-09 DIAGNOSIS — J479 Bronchiectasis, uncomplicated: Secondary | ICD-10-CM | POA: Diagnosis not present

## 2022-04-10 DIAGNOSIS — J479 Bronchiectasis, uncomplicated: Secondary | ICD-10-CM | POA: Diagnosis not present

## 2022-04-13 MED FILL — Fosaprepitant Dimeglumine For IV Infusion 150 MG (Base Eq): INTRAVENOUS | Qty: 5 | Status: AC

## 2022-04-13 MED FILL — Dexamethasone Sodium Phosphate Inj 100 MG/10ML: INTRAMUSCULAR | Qty: 1 | Status: AC

## 2022-04-14 ENCOUNTER — Inpatient Hospital Stay (HOSPITAL_BASED_OUTPATIENT_CLINIC_OR_DEPARTMENT_OTHER): Payer: PPO | Admitting: Oncology

## 2022-04-14 ENCOUNTER — Encounter: Payer: Self-pay | Admitting: Oncology

## 2022-04-14 ENCOUNTER — Other Ambulatory Visit: Payer: Self-pay

## 2022-04-14 ENCOUNTER — Inpatient Hospital Stay: Payer: PPO

## 2022-04-14 VITALS — BP 137/70 | HR 83 | Resp 16

## 2022-04-14 VITALS — BP 139/80 | HR 70 | Temp 97.5°F | Resp 16 | Ht 62.0 in | Wt 101.1 lb

## 2022-04-14 DIAGNOSIS — C541 Malignant neoplasm of endometrium: Secondary | ICD-10-CM | POA: Diagnosis not present

## 2022-04-14 DIAGNOSIS — Z5111 Encounter for antineoplastic chemotherapy: Secondary | ICD-10-CM | POA: Diagnosis not present

## 2022-04-14 DIAGNOSIS — T451X5A Adverse effect of antineoplastic and immunosuppressive drugs, initial encounter: Secondary | ICD-10-CM

## 2022-04-14 DIAGNOSIS — G620 Drug-induced polyneuropathy: Secondary | ICD-10-CM

## 2022-04-14 DIAGNOSIS — D6481 Anemia due to antineoplastic chemotherapy: Secondary | ICD-10-CM | POA: Diagnosis not present

## 2022-04-14 LAB — CBC WITH DIFFERENTIAL/PLATELET
Abs Immature Granulocytes: 0.04 10*3/uL (ref 0.00–0.07)
Basophils Absolute: 0.1 10*3/uL (ref 0.0–0.1)
Basophils Relative: 1 %
Eosinophils Absolute: 0 10*3/uL (ref 0.0–0.5)
Eosinophils Relative: 0 %
HCT: 31.4 % — ABNORMAL LOW (ref 36.0–46.0)
Hemoglobin: 10.4 g/dL — ABNORMAL LOW (ref 12.0–15.0)
Immature Granulocytes: 1 %
Lymphocytes Relative: 22 %
Lymphs Abs: 1.1 10*3/uL (ref 0.7–4.0)
MCH: 33 pg (ref 26.0–34.0)
MCHC: 33.1 g/dL (ref 30.0–36.0)
MCV: 99.7 fL (ref 80.0–100.0)
Monocytes Absolute: 0.7 10*3/uL (ref 0.1–1.0)
Monocytes Relative: 14 %
Neutro Abs: 2.9 10*3/uL (ref 1.7–7.7)
Neutrophils Relative %: 62 %
Platelets: 284 10*3/uL (ref 150–400)
RBC: 3.15 MIL/uL — ABNORMAL LOW (ref 3.87–5.11)
RDW: 16.9 % — ABNORMAL HIGH (ref 11.5–15.5)
WBC: 4.7 10*3/uL (ref 4.0–10.5)
nRBC: 0 % (ref 0.0–0.2)

## 2022-04-14 LAB — COMPREHENSIVE METABOLIC PANEL
ALT: 18 U/L (ref 0–44)
AST: 25 U/L (ref 15–41)
Albumin: 3.7 g/dL (ref 3.5–5.0)
Alkaline Phosphatase: 134 U/L — ABNORMAL HIGH (ref 38–126)
Anion gap: 9 (ref 5–15)
BUN: 17 mg/dL (ref 8–23)
CO2: 21 mmol/L — ABNORMAL LOW (ref 22–32)
Calcium: 8.9 mg/dL (ref 8.9–10.3)
Chloride: 103 mmol/L (ref 98–111)
Creatinine, Ser: 0.9 mg/dL (ref 0.44–1.00)
GFR, Estimated: >60 mL/min (ref >60)
Glucose, Bld: 104 mg/dL — ABNORMAL HIGH (ref 70–99)
Potassium: 4.4 mmol/L (ref 3.5–5.1)
Sodium: 133 mmol/L — ABNORMAL LOW (ref 135–145)
Total Bilirubin: 0.3 mg/dL (ref 0.3–1.2)
Total Protein: 7 g/dL (ref 6.5–8.1)

## 2022-04-14 MED ORDER — FAMOTIDINE IN NACL 20-0.9 MG/50ML-% IV SOLN
20.0000 mg | Freq: Once | INTRAVENOUS | Status: AC
  Administered 2022-04-14: 20 mg via INTRAVENOUS
  Filled 2022-04-14: qty 50

## 2022-04-14 MED ORDER — SODIUM CHLORIDE 0.9 % IV SOLN
230.4000 mg | Freq: Once | INTRAVENOUS | Status: AC
  Administered 2022-04-14: 230 mg via INTRAVENOUS
  Filled 2022-04-14: qty 23

## 2022-04-14 MED ORDER — SODIUM CHLORIDE 0.9 % IV SOLN
10.0000 mg | Freq: Once | INTRAVENOUS | Status: AC
  Administered 2022-04-14: 10 mg via INTRAVENOUS
  Filled 2022-04-14: qty 10

## 2022-04-14 MED ORDER — SODIUM CHLORIDE 0.9 % IV SOLN
150.0000 mg | Freq: Once | INTRAVENOUS | Status: AC
  Administered 2022-04-14: 150 mg via INTRAVENOUS
  Filled 2022-04-14: qty 150

## 2022-04-14 MED ORDER — SODIUM CHLORIDE 0.9% FLUSH
10.0000 mL | INTRAVENOUS | Status: DC | PRN
  Administered 2022-04-14: 10 mL
  Filled 2022-04-14: qty 10

## 2022-04-14 MED ORDER — SODIUM CHLORIDE 0.9 % IV SOLN
Freq: Once | INTRAVENOUS | Status: AC
  Filled 2022-04-14: qty 250

## 2022-04-14 MED ORDER — GABAPENTIN 300 MG PO CAPS: 300.0000 mg | ORAL_CAPSULE | Freq: Two times a day (BID) | ORAL | 0 refills | Status: DC

## 2022-04-14 MED ORDER — HEPARIN SOD (PORK) LOCK FLUSH 100 UNIT/ML IV SOLN
500.0000 [IU] | Freq: Once | INTRAVENOUS | Status: AC | PRN
  Administered 2022-04-14: 500 [IU]
  Filled 2022-04-14: qty 5

## 2022-04-14 MED ORDER — PALONOSETRON HCL INJECTION 0.25 MG/5ML
0.2500 mg | Freq: Once | INTRAVENOUS | Status: AC
  Administered 2022-04-14: 0.25 mg via INTRAVENOUS
  Filled 2022-04-14: qty 5

## 2022-04-14 MED ORDER — SODIUM CHLORIDE 0.9 % IV SOLN
135.0000 mg/m2 | Freq: Once | INTRAVENOUS | Status: AC
  Administered 2022-04-14: 192 mg via INTRAVENOUS
  Filled 2022-04-14: qty 32

## 2022-04-14 MED ORDER — DIPHENHYDRAMINE HCL 50 MG/ML IJ SOLN
50.0000 mg | Freq: Once | INTRAMUSCULAR | Status: AC
  Administered 2022-04-14: 50 mg via INTRAVENOUS
  Filled 2022-04-14: qty 1

## 2022-04-14 NOTE — Progress Notes (Signed)
Hematology/Oncology Consult note [facility]  [phone] [phone]  Patient Care Team: Adin Hector, MD as PCP - General (Internal Medicine) Rockwell Germany, RN as Oncology Nurse Navigator Mauro Kaufmann, RN as Oncology Nurse Navigator Rolm Bookbinder, MD as Consulting Physician (General Surgery) Eppie Gibson, MD as Attending Physician (Radiation Oncology) Rubie Maid, MD as Consulting Physician (Obstetrics and Gynecology) Clent Jacks, RN as Oncology Nurse Navigator Sindy Guadeloupe, MD as Consulting Physician (Oncology)   Name of the patient: Elizabeth Friedman  [id number]  Sep 28, 1941   Date of visit: 04/14/22  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  Initially she saw GI but given her history of pessary placement for 20 years she decided to see GYN.  She had an ultrasound pelvis which showed mixed echogenicity mass in the endometrial canal measuring 3.7 x 4.1 x 3.3 cm.  She was also having some vaginal spotting and brown discharge.  She underwent hysteroscopy with removal of endometrial mass and D&C which showed carcinosarcoma/malignant mixed mllerian tumor.   CT chest abdomen and pelvis showed enhancing lesion in the anterior fundus but no other evidence of distant metastatic disease in chest abdomen and pelvis.   Patient also has a prior history of right breast DCIS s/p lumpectomy and radiation treatment in 2021.  She was following up with Dr. Jana Hakim at [facility] previously.  She is not on any endocrine therapy for the same.   Patient underwent total laparoscopic hysterectomy with bilateral salpingo-oophorectomy and bilateral pelvic sentinel lymph node injection mapping and biopsies on  12/24/2021.  Final pathology showed a 3.9 cm carcinosarcoma.  Less than 50% myometrial invasion.  Uterine serosa not identified.  Malignant cells were present in the peritoneal/ascites fluid.  Lymphatic invasion not identified.  Margins negative.  2 sentinel lymph nodes on the right and left iliac were negative for malignancy.  No evidence of malignancy in the fallopian tubes.  Cystadenofibroma noted in the ovaries pT1a pN0 FIGO stage IA   Plan is to proceed with adjuvant CarboTaxol chemotherapy every 3 weeks x6 cycles if patient can tolerate it especially given positive pelvic washings.  Followed by vaginal brachytherapy    Interval history-she has been more fatigued since the last chemo.  She still lives independently and isIndependent of her ADLs.  She was seen by Dr. Lanney Gins for her findings of bronchiectasis on the CT scan and was prescribed Azithromycin.  Feels that her cough is somewhat better after starting it.  Neuropathy has remained overall stable and she was also seen by Dr. Caryl Comes.  Her gabapentin dose was increased from 300 mg twice daily to 400 mg twice daily which she is tolerating it well.   ECOG PS- 1 Pain scale- 0   Review of systems- Review of Systems  Constitutional:  Negative for chills, fever, malaise/fatigue and weight loss.  HENT:  Negative for congestion, ear discharge and nosebleeds.   Eyes:  Negative for blurred vision.  Respiratory:  Negative for cough, hemoptysis, sputum production, shortness of breath and wheezing.   Cardiovascular:  Negative for chest pain, palpitations, orthopnea and claudication.  Gastrointestinal:  Negative for abdominal pain, blood in stool, constipation, diarrhea, heartburn, melena, nausea and vomiting.  Genitourinary:  Negative for dysuria, flank pain, frequency, hematuria  and urgency.  Musculoskeletal:  Negative for back pain, joint pain and myalgias.  Skin:  Negative for rash.  Neurological:  Negative for dizziness, tingling, focal  weakness, seizures, weakness and headaches.  Endo/Heme/Allergies:  Does not bruise/bleed easily.  Psychiatric/Behavioral:  Negative for depression and suicidal ideas. The patient does not have insomnia.       No Known Allergies   Past Medical History:  Diagnosis Date   Allergic rhinitis    Anemia    Anginal pain (HCC)    Arthritis    Atypical chest pain    Breast cancer ([place])    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[place])    Ductal carcinoma in situ (DCIS) of right breast    Fibrocystic breast disease    GERD (gastroesophageal reflux disease)    Hearing aid worn    bilateral   History of hiatal hernia    Hyperlipidemia    Lung nodule    Menopause    Migraine    Migraine    optical migraines   Motion sickness    Plantar fasciitis    Prolapse of female pelvic organs    hodge pessary   Raynaud's disease    Sciatic leg pain    Sciatica    Skin cancer of face    Urinary retention with incomplete bladder emptying    Uterine carcinosarcoma ([place]) 11/2021   Vitreous detachment of both eyes      Past Surgical History:  Procedure Laterality Date   APPENDECTOMY  1951   BREAST LUMPECTOMY WITH RADIOACTIVE SEED LOCALIZATION Right 05/29/2020   Procedure: RIGHT BREAST LUMPECTOMY WITH RADIOACTIVE SEED LOCALIZATION;  Surgeon: Rolm Bookbinder, MD;  Location: [place];  Service: General;  Laterality: Right;   CARDIAC CATHETERIZATION  2004   negative   CATARACT EXTRACTION W/PHACO Right 02/26/2021   Procedure: CATARACT EXTRACTION PHACO AND INTRAOCULAR LENS PLACEMENT (I[facility]) RIGHT 5.86 01:19.6 7.4%;  Surgeon: Leandrew Koyanagi, MD;  Location: [place];  Service: Ophthalmology;  Laterality: Right;   CATARACT EXTRACTION W/PHACO Left 09/03/2021   Procedure: CATARACT EXTRACTION PHACO AND INTRAOCULAR LENS PLACEMENT ([place]) LEFT;  Surgeon: Leandrew Koyanagi, MD;  Location: [place];  Service: Ophthalmology;  Laterality: Left;  9.61 01:15.9   COLONOSCOPY     2000, 2006,  2012   COLONOSCOPY WITH PROPOFOL N/A 03/27/2016   Procedure: COLONOSCOPY WITH PROPOFOL;  Surgeon: Manya Silvas, MD;  Location[facility] END[facility]COPY;  Service: Endoscopy;  Laterality: N/A;   COLONOSCOPY WITH PROPOFOL N/A 08/14/2020   Procedure: COLONOSCOPY WITH PROPOFOL;  Surgeon: Toledo, Benay Pike, MD;  Location: [facility] ENDOSCOPY;  Service: Gastroenterology;  Laterality: N/A;   CYSTOSCOPY N/A 12/24/2021   Procedure: CYSTOSCOPY;  Surgeon: Jaquita Folds, MD;  Location: [facility];  Service: Gynecology;  Laterality: N/A;   DILATATION & CURETTAGE/HYSTEROSCOPY WITH MYOSURE N/A 12/01/2021   Procedure: DILATATION & CURETTAGE/HYSTEROSCOPY WITH MYOSURE;  Surgeon: Rubie Maid, MD;  Location: [facility];  Service: Gynecology;  Laterality: N/A;   ESOPHAGOGASTRODUODENOSCOPY  08/1999   ESOPHAGOGASTRODUODENOSCOPY (EGD) WITH PROPOFOL N/A 08/14/2020   Procedure: ESOPHAGOGASTRODUODENOSCOPY (EGD) WITH PROPOFOL;  Surgeon: Toledo, Benay Pike, MD;  Location: [facility] ENDOSCOPY;  Service: Gastroenterology;  Laterality: N/A;   ESOPHAGOGASTRODUODENOSCOPY (EGD) WITH PROPOFOL N/A 03/21/2021   Procedure: ESOPHAGOGASTRODUODENOSCOPY (EGD) WITH PROPOFOL;  Surgeon: Toledo, Benay Pike, MD;  Location: [facility] ENDOSCOPY;  Service: Gastroenterology;  Laterality: N/A;   PORTA CATH INSERTION N/A 01/15/2022   Procedure: PORTA CATH INSERTION;  Surgeon: Algernon Huxley, MD;  Location: [place] [facility];  Service: Cardiovascular;  Laterality: N/A;   SKIN CANCER EXCISION     face and neck   TOTAL LAPAROSCOPIC HYSTERECTOMY WITH BILATERAL SALPINGO OOPHORECTOMY Bilateral 12/24/2021   Procedure: TOTAL LAPAROSCOPIC HYSTERECTOMY WITH BILATERAL SALPINGO OOPHORECTOMY, PELVIC NODE BIOPSY,  PESSARY REMOVAL (PESSARY REMOVAL BY DR. Wannetta Sender;  Surgeon: Mellody Drown, MD;  Location: [facility];  Service: Gynecology;  Laterality: Bilateral;    Social History   Socioeconomic History   Marital status: Widowed    Spouse name: Not on file   Number of children:  Not on file   Years of education: Not on file   Highest education level: Not on file  Occupational History   Not on file  Tobacco Use   Smoking status: Never   Smokeless tobacco: Never  Vaping Use   Vaping Use: Never used  Substance and Sexual Activity   Alcohol use: No   Drug use: No   Sexual activity: Not Currently    Birth control/protection: Post-menopausal  Other Topics Concern   Not on file  Social History Narrative   Lives alone   Social Determinants of Health   Financial Resource Strain: Not on file  Food Insecurity: Not on file  Transportation Needs: Not on file  Physical Activity: Sufficiently Active (12/21/2017)   Exercise Vital Sign    Days of Exercise per Week: 3 days    Minutes of Exercise per Session: 60 min  Stress: Not on file  Social Connections: Not on file  Intimate Partner Violence: Not on file    Family History  Problem Relation Age of Onset   Breast cancer Mother    Heart disease Father    Colon cancer Brother    Diabetes Neg Hx    Ovarian cancer Neg Hx      Current Outpatient Medications:    acetaminophen (TYLENOL) 500 MG tablet, Take 1,000 mg by mouth every 6 (six) hours as needed for moderate pain or mild pain., Disp: , Rfl:    alum & mag hydroxide-simeth (MAALOX/MYLANTA) 200-200-20 MG/5ML suspension, Take 30 mLs by mouth every 6 (six) hours as needed for indigestion or heartburn., Disp: , Rfl:    Calcium-Vitamin D (CALTRATE 600 PLUS-VIT D PO), Take 1 tablet by mouth in the morning., Disp: , Rfl:    Carboxymethylcellulose Sod PF 1 % GEL, Place 1 drop into both eyes at bedtime., Disp: , Rfl:    celecoxib (CELEBREX) 200 MG capsule, Take 200 mg by mouth 2 (two) times daily., Disp: , Rfl:    Cholecalciferol (VITAMIN D) 125 MCG (5000 UT) CAPS, Take 5,000 Units by mouth in the morning., Disp: , Rfl:    Coenzyme Q10 200 MG TABS, Take 200 mg by mouth in the morning., Disp: , Rfl:    Cyanocobalamin (VITAMIN B-12 PO), Take 1 tablet by mouth in the  morning., Disp: , Rfl:    dexamethasone (DECADRON) 4 MG tablet, Take 2 tablets by mouth once a day starting the day after chemotherapy. Continue for 3 days total. Take with food., Disp: 30 tablet, Rfl: 1   gabapentin (NEURONTIN) 300 MG capsule, Take 1 capsule (300 mg total) by mouth 2 (two) times daily., Disp: 60 capsule, Rfl: 0   Iron-Vitamin C (VITRON-C) 65-125 MG TABS, Take 1 tablet by mouth in the morning., Disp: , Rfl:    lidocaine-prilocaine (EMLA) cream, Apply to affected area once, Disp: 30 g, Rfl: 3   LORazepam (ATIVAN) 0.5 MG tablet, Take 1 tablet (0.5 mg total) by mouth every 6 (six) hours as needed (Nausea or  vomiting). (Patient not taking: Reported on 03/02/2022), Disp: 30 tablet, Rfl: 0   Omega-3 Fatty Acids (FISH OIL PO), Take 1,400 mg by mouth in the morning., Disp: , Rfl:    omeprazole (PRILOSEC) 40 MG capsule, Take 40 mg by mouth daily in the afternoon. (Patient not taking: Reported on 03/02/2022), Disp: , Rfl:    ondansetron (ZOFRAN) 8 MG tablet, Take 1 tablet (8 mg total) by mouth 2 (two) times daily as needed. Start on the third day after chemotherapy. (Patient not taking: Reported on 02/09/2022), Disp: 30 tablet, Rfl: 1   pantoprazole (PROTONIX) 40 MG tablet, Take 40 mg by mouth in the morning., Disp: , Rfl:    prochlorperazine (COMPAZINE) 10 MG tablet, Take 1 tablet (10 mg total) by mouth every 6 (six) hours as needed (Nausea or vomiting). (Patient not taking: Reported on 02/09/2022), Disp: 30 tablet, Rfl: 1   RESTASIS 0.05 % ophthalmic emulsion, Place 1 drop into both eyes 2 (two) times daily., Disp: , Rfl:    simvastatin (ZOCOR) 10 MG tablet, Take 10 mg by mouth every evening., Disp: , Rfl:    topiramate (TOPAMAX) 50 MG tablet, Take 50 mg by mouth every evening., Disp: , Rfl:    Triamcinolone Acetonide (NASACORT ALLERGY 24HR NA), Place 1 spray into the nose daily as needed (sinus congestion). (Patient not taking: Reported on 03/02/2022), Disp: , Rfl:    vitamin E 180 MG (400 UNITS)  capsule, Take 400 Units by mouth in the morning., Disp: , Rfl:   Physical exam:  Vitals:   04/14/22 0918  BP: 139/80  Pulse: 70  Resp: 16  Temp: (!) 97.5 F (36.4 C)  TempSrc: Oral  Weight: 101 lb 1.6 oz (45.9 kg)  Height: '5\' 2"'$  (1.575 m)   Physical Exam Cardiovascular:     Rate and Rhythm: Normal rate and regular rhythm.     Heart sounds: Normal heart sounds.  Pulmonary:     Effort: Pulmonary effort is normal.     Breath sounds: Normal breath sounds.  Abdominal:     General: Bowel sounds are normal.     Palpations: Abdomen is soft.  Skin:    General: Skin is warm and dry.  Neurological:     Mental Status: She is alert and oriented to person, place, and time.         Latest Ref Rng & Units 04/14/2022    7:55 AM  CMP  Glucose 70 - 99 mg/dL 104   BUN 8 - 23 mg/dL 17   Creatinine 0.44 - 1.00 mg/dL 0.90   Sodium 135 - 145 mmol/L 133   Potassium 3.5 - 5.1 mmol/L 4.4   Chloride 98 - 111 mmol/L 103   CO2 22 - 32 mmol/L 21   Calcium 8.9 - 10.3 mg/dL 8.9   Total Protein 6.5 - 8.1 g/dL 7.0   Total Bilirubin 0.3 - 1.2 mg/dL 0.3   Alkaline Phos 38 - 126 U/L 134   AST 15 - 41 U/L 25   ALT 0 - 44 U/L 18       Latest Ref Rng & Units 04/14/2022    7:55 AM  CBC  WBC 4.0 - 10.5 K/uL 4.7   Hemoglobin 12.0 - 15.0 g/dL 10.4   Hematocrit 36.0 - 46.0 % 31.4   Platelets 150 - 400 K/uL 284     Assessment and plan- Patient is a 81 y.o. female  with stage I apT1 aN0 M0 carcinosarcoma of the uterus.  She is here  for on treatment assessment prior to cycle 5 of CarboTaxol chemotherapy  Counts okay to proceed with cycle 5 of CarboTaxol chemotherapy today.  She has been receiving Taxol at a reduced dose of 135 mg per metered squared due to pre-existing neuropathy.  She is also receiving carboplatin at AUC 4.  She will receive Udenyca on day 3 and I will see her back in 3 weeks for cycle 6 of CarboTaxol chemotherapy which will be her last cycle  Normocytic anemia likely secondary to  chemotherapy and iron deficiency.  She did receive IV iron in the past.  Hemoglobin is presently stable around 10.4.  B12 levels are normal.  Continue to monitor  Chemo-induced peripheral neuropathy: Continue gabapentin 400 mg twice daily and I have renewed her medication today.   Visit Diagnosis 1. Encounter for antineoplastic chemotherapy   2. Chemotherapy-induced peripheral neuropathy ([place])   3. Antineoplastic chemotherapy induced anemia   4. Endometrial cancer [facility])      Dr. Randa Evens, MD, MPH [facility] [phone] 04/14/2022 10:05 AM

## 2022-04-14 NOTE — Patient Instructions (Signed)
[facility]  Discharge Instructions: Thank you for choosing [facility] to provide your oncology and hematology care.  If you have a lab appointment with the Cancer Center, please go directly to the Cancer Center and check in at the registration area.  Wear comfortable clothing and clothing appropriate for easy access to any Portacath or PICC line.   We strive to give you quality time with your provider. You may need to reschedule your appointment if you arrive late (15 or more minutes).  Arriving late affects you and other patients whose appointments are after yours.  Also, if you miss three or more appointments without notifying the office, you may be dismissed from the clinic at the provider's discretion.      For prescription refill requests, have your pharmacy contact our office and allow 72 hours for refills to be completed.    Today you received the following chemotherapy and/or immunotherapy agents Carboplatin & Taxol      To help prevent nausea and vomiting after your treatment, we encourage you to take your nausea medication as directed.  BELOW ARE SYMPTOMS THAT SHOULD BE REPORTED IMMEDIATELY: *FEVER GREATER THAN 100.4 F (38 C) OR HIGHER *CHILLS OR SWEATING *NAUSEA AND VOMITING THAT IS NOT CONTROLLED WITH YOUR NAUSEA MEDICATION *UNUSUAL SHORTNESS OF BREATH *UNUSUAL BRUISING OR BLEEDING *URINARY PROBLEMS (pain or burning when urinating, or frequent urination) *BOWEL PROBLEMS (unusual diarrhea, constipation, pain near the anus) TENDERNESS IN MOUTH AND THROAT WITH OR WITHOUT PRESENCE OF ULCERS (sore throat, sores in mouth, or a toothache) UNUSUAL RASH, SWELLING OR PAIN  UNUSUAL VAGINAL DISCHARGE OR ITCHING   Items with * indicate a potential emergency and should be followed up as soon as possible or go to the Emergency Department if any problems should occur.  Please show the CHEMOTHERAPY ALERT CARD or IMMUNOTHERAPY ALERT CARD at  check-in to the Emergency Department and triage nurse.  Should you have questions after your visit or need to cancel or reschedule your appointment, please contact [facility]  [phone] and follow the prompts.  Office hours are 8:00 a.m. to 4:30 p.m. Monday - Friday. Please note that voicemails left after 4:00 p.m. may not be returned until the following business day.  We are closed weekends and major holidays. You have access to a nurse at all times for urgent questions. Please call the main number to the clinic [phone] and follow the prompts.  For any non-urgent questions, you may also contact your provider using MyChart. We now offer e-Visits for anyone 18 and older to request care online for non-urgent symptoms. For details visit [email or web address].   Also download the MyChart app! Go to the app store, search "MyChart", open the app, select [facility], and log in with your MyChart username and password.  Masks are optional in the cancer centers. If you would like for your care team to wear a mask while they are taking care of you, please let them know. For doctor visits, patients may have with them one support person who is at least 82 years old. At this time, visitors are not allowed in the infusion area.   

## 2022-04-16 ENCOUNTER — Inpatient Hospital Stay: Payer: PPO

## 2022-04-16 DIAGNOSIS — C541 Malignant neoplasm of endometrium: Secondary | ICD-10-CM

## 2022-04-16 DIAGNOSIS — Z5111 Encounter for antineoplastic chemotherapy: Secondary | ICD-10-CM | POA: Diagnosis not present

## 2022-04-16 MED ORDER — PEGFILGRASTIM-CBQV 6 MG/0.6ML ~~LOC~~ SOSY
6.0000 mg | PREFILLED_SYRINGE | Freq: Once | SUBCUTANEOUS | Status: AC
  Administered 2022-04-16: 6 mg via SUBCUTANEOUS
  Filled 2022-04-16: qty 0.6

## 2022-04-30 ENCOUNTER — Ambulatory Visit
Admission: RE | Admit: 2022-04-30 | Discharge: 2022-04-30 | Disposition: A | Discharge status: Home / Self Care | Payer: PPO | Source: Ambulatory Visit | Attending: Radiation Oncology | Admitting: Radiation Oncology

## 2022-04-30 VITALS — BP 165/75 | HR 77 | Wt 103.4 lb

## 2022-04-30 DIAGNOSIS — Z90710 Acquired absence of both cervix and uterus: Secondary | ICD-10-CM | POA: Insufficient documentation

## 2022-04-30 DIAGNOSIS — C541 Malignant neoplasm of endometrium: Secondary | ICD-10-CM | POA: Insufficient documentation

## 2022-04-30 DIAGNOSIS — Z90722 Acquired absence of ovaries, bilateral: Secondary | ICD-10-CM | POA: Insufficient documentation

## 2022-04-30 DIAGNOSIS — I7300 Raynaud's syndrome without gangrene: Secondary | ICD-10-CM | POA: Diagnosis not present

## 2022-04-30 DIAGNOSIS — Z85828 Personal history of other malignant neoplasm of skin: Secondary | ICD-10-CM | POA: Insufficient documentation

## 2022-04-30 DIAGNOSIS — Z79899 Other long term (current) drug therapy: Secondary | ICD-10-CM | POA: Diagnosis not present

## 2022-04-30 DIAGNOSIS — Z803 Family history of malignant neoplasm of breast: Secondary | ICD-10-CM | POA: Diagnosis not present

## 2022-04-30 DIAGNOSIS — E785 Hyperlipidemia, unspecified: Secondary | ICD-10-CM | POA: Insufficient documentation

## 2022-04-30 DIAGNOSIS — R197 Diarrhea, unspecified: Secondary | ICD-10-CM | POA: Insufficient documentation

## 2022-04-30 DIAGNOSIS — Z7952 Long term (current) use of systemic steroids: Secondary | ICD-10-CM | POA: Diagnosis not present

## 2022-04-30 DIAGNOSIS — N6011 Diffuse cystic mastopathy of right breast: Secondary | ICD-10-CM | POA: Insufficient documentation

## 2022-04-30 DIAGNOSIS — K219 Gastro-esophageal reflux disease without esophagitis: Secondary | ICD-10-CM | POA: Diagnosis not present

## 2022-04-30 DIAGNOSIS — Z800 Family history of malignant neoplasm of digestive organs: Secondary | ICD-10-CM | POA: Diagnosis not present

## 2022-04-30 DIAGNOSIS — Z791 Long term (current) use of non-steroidal anti-inflammatories (NSAID): Secondary | ICD-10-CM | POA: Insufficient documentation

## 2022-04-30 NOTE — Consult Note (Signed)
NEW PATIENT EVALUATION  Name: Elizabeth Friedman  MRN: [id number]  Date:   04/30/2022     DOB: [birth year]   This 81 y.o. 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   PREVIOUS INVESTIGATIONS:  Pathology reports reviewed CT scan chest abdomen pelvis reviewed Clinical notes reviewed   HPI: Patient is a 81 year old female who presented with postmenopausal bleeding.  She is also having pelvic pain although had a pessary placed 20 years prior.  Ultrasound at that time showed a 3.7 x 4.1 x 3.3 cm mixed echogenicity mass in the endometrial canal.  She underwent hysteroscopy which showed carcinosarcoma malignant mixed  mllerian tumor.  CT scan of chest abdomen pelvis demonstrated a 3.4 cm heterogeneous enhancing lesion in the anterior uterine fundus representing known neoplasm.  There is no evidence of distant disease in the chest abdomen or pelvis.  She underwent a sentinel node biopsy as well as TAH/BSO showing again carcinosarcoma 3.9 cm in greatest dimension.  There was malignant cells present in her peritoneal fluid.  Margins were negative for invasive carcinoma.  There is less than 50% myometrial invasion.  All regional lymph nodes and sentinel nodes were negative for metastatic disease.  Based on her pathology she is undergone CarboTaxol chemotherapy with 1 cycle remaining which she is tolerated fairly well.  She is now referred to radiation oncology for consideration of treatment.  She has daily loose bowels and takes milk of magnesia every day.  She is having no significant urinary side effects at this time.  PLANNED TREATMENT REGIMEN: Vaginal brachytherapy  PAST  MEDICAL HISTORY:  has a past medical history of Allergic rhinitis, Anemia, Anginal pain ([place]), Arthritis, Atypical chest pain, Breast cancer ([place]), 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[place]), Ductal carcinoma in situ (DCIS) of right breast, Fibrocystic breast disease, GERD (gastroesophageal reflux disease), Hearing aid worn, History of hiatal hernia, Hyperlipidemia, Lung nodule, Menopause, Migraine, Migraine, Motion sickness, Plantar fasciitis, Prolapse of female pelvic organs, Raynaud's disease, Sciatic leg pain, Sciatica, Skin cancer of face, Urinary retention with incomplete bladder emptying, Uterine carcinosarcoma ([place]) (11/2021), and Vitreous detachment of both eyes.    PAST SURGICAL HISTORY:  Past Surgical History:  Procedure Laterality Date   APPENDECTOMY  1951   BREAST LUMPECTOMY WITH RADIOACTIVE SEED LOCALIZATION Right 05/29/2020   Procedure: RIGHT BREAST LUMPECTOMY WITH RADIOACTIVE SEED LOCALIZATION;  Surgeon: Rolm Bookbinder, MD;  Location: [place];  Service: General;  Laterality: Right;   CARDIAC CATHETERIZATION  2004   negative   CATARACT EXTRACTION W/PHACO Right 02/26/2021   Procedure: CATARACT EXTRACTION PHACO AND INTRAOCULAR LENS PLACEMENT (IOC) RIGHT 5.86 01:19.6 7.4%;  Surgeon: Leandrew Koyanagi, MD;  Location: [place];  Service: Ophthalmology;  Laterality: Right;   CATARACT EXTRACTION W/PHACO Left 09/03/2021   Procedure: CATARACT EXTRACTION PHACO AND INTRAOCULAR LENS PLACEMENT ([place]) LEFT;  Surgeon: Leandrew Koyanagi, MD;  Location: [place];  Service: Ophthalmology;  Laterality: Left;  9.61 01:15.9   COLONOSCOPY     2000, 2006, 2012   COLONOSCOPY WITH PROPOFOL N/A 03/27/2016   Procedure: COLONOSCOPY WITH PROPOFOL;  Surgeon: Manya Silvas, MD;  Location: [facility];  Service: Endoscopy;  Laterality: N/A;   COLONOSCOPY WITH  PROPOFOL N/A 08/14/2020   Procedure: COLONOSCOPY WITH PROPOFOL;  Surgeon: Toledo, Benay Pike, MD;  Location: [facility] ENDOSCOPY;   Service: Gastroenterology;  Laterality: N/A;   CYSTOSCOPY N/A 12/24/2021   Procedure: CYSTOSCOPY;  Surgeon: Jaquita Folds, MD;  Location: [facility];  Service: Gynecology;  Laterality: N/A;   DILATATION & CURETTAGE/HYSTEROSCOPY WITH MYOSURE N/A 12/01/2021   Procedure: DILATATION & CURETTAGE/HYSTEROSCOPY WITH MYOSURE;  Surgeon: Rubie Maid, MD;  Location: [facility];  Service: Gynecology;  Laterality: N/A;   ESOPHAGOGASTRODUODENOSCOPY  08/1999   ESOPHAGOGASTRODUODENOSCOPY (EGD) WITH PROPOFOL N/A 08/14/2020   Procedure: ESOPHAGOGASTRODUODENOSCOPY (EGD) WITH PROPOFOL;  Surgeon: Toledo, Benay Pike, MD;  Location: [facility];  Service: Gastroenterology;  Laterality: N/A;   ESOPHAGOGASTRODUODENOSCOPY (EGD) WITH PROPOFOL N/A 03/21/2021   Procedure: ESOPHAGOGASTRODUODENOSCOPY (EGD) WITH PROPOFOL;  Surgeon: Toledo, Benay Pike, MD;  Location: [facility];  Service: Gastroenterology;  Laterality: N/A;   PORTA CATH INSERTION N/A 01/15/2022   Procedure: PORTA CATH INSERTION;  Surgeon: Algernon Huxley, MD;  Location: [place] [facility];  Service: Cardiovascular;  Laterality: N/A;   SKIN CANCER EXCISION     face and neck   TOTAL LAPAROSCOPIC HYSTERECTOMY WITH BILATERAL SALPINGO OOPHORECTOMY Bilateral 12/24/2021   Procedure: TOTAL LAPAROSCOPIC HYSTERECTOMY WITH BILATERAL SALPINGO OOPHORECTOMY, PELVIC NODE BIOPSY,  PESSARY REMOVAL (PESSARY REMOVAL BY DR. Wannetta Sender;  Surgeon: Mellody Drown, MD;  Location: [facility];  Service: Gynecology;  Laterality: Bilateral;    FAMILY HISTORY: family history includes Breast cancer in her mother; Colon cancer in her brother; Heart disease in her father.  SOCIAL HISTORY:  reports that she has never smoked. She has never used smokeless tobacco. She reports that she does not drink alcohol and does not use drugs.  ALLERGIES: Patient has no known allergies.  MEDICATIONS:  Current Outpatient Medications  Medication Sig Dispense Refill   acetaminophen (TYLENOL) 500 MG  tablet Take 1,000 mg by mouth every 6 (six) hours as needed for moderate pain or mild pain.     alum & mag hydroxide-simeth (MAALOX/MYLANTA) 200-200-20 MG/5ML suspension Take 30 mLs by mouth every 6 (six) hours as needed for indigestion or heartburn.     azithromycin (ZITHROMAX) 250 MG tablet Take 250 mg by mouth daily.     Calcium-Vitamin D (CALTRATE 600 PLUS-VIT D PO) Take 1 tablet by mouth in the morning.     Carboxymethylcellulose Sod PF 1 % GEL Place 1 drop into both eyes at bedtime.     celecoxib (CELEBREX) 200 MG capsule Take 200 mg by mouth 2 (two) times daily.     Cholecalciferol (VITAMIN D) 125 MCG (5000 UT) CAPS Take 5,000 Units by mouth in the morning.     Coenzyme Q10 200 MG TABS Take 200 mg by mouth in the morning.     Cyanocobalamin (VITAMIN B-12 PO) Take 1 tablet by mouth in the morning.     dexamethasone (DECADRON) 4 MG tablet Take 2 tablets by mouth once a day starting the day after chemotherapy. Continue for 3 days total. Take with food. 30 tablet 1   gabapentin (NEURONTIN) 100 MG capsule Take 100 mg by mouth 2 (two) times daily.     gabapentin (NEURONTIN) 300 MG capsule Take 1 capsule (300 mg total) by mouth 2 (two) times daily. 60 capsule 0   Iron-Vitamin C (VITRON-C) 65-125 MG TABS Take 1 tablet by mouth in the morning.     lidocaine-prilocaine (EMLA) cream Apply to affected area once 30 g 3   LORazepam (ATIVAN) 0.5 MG tablet Take 1 tablet (0.5  mg total) by mouth every 6 (six) hours as needed (Nausea or vomiting). (Patient not taking: Reported on 04/14/2022) 30 tablet 0   Omega-3 Fatty Acids (FISH OIL PO) Take 1,400 mg by mouth in the morning.     omeprazole (PRILOSEC) 40 MG capsule Take 40 mg by mouth daily in the afternoon. (Patient not taking: Reported on 03/02/2022)     ondansetron (ZOFRAN) 8 MG tablet Take 1 tablet (8 mg total) by mouth 2 (two) times daily as needed. Start on the third day after chemotherapy. (Patient not taking: Reported on 02/09/2022) 30 tablet 1    pantoprazole (PROTONIX) 40 MG tablet Take 40 mg by mouth in the morning.     prochlorperazine (COMPAZINE) 10 MG tablet Take 1 tablet (10 mg total) by mouth every 6 (six) hours as needed (Nausea or vomiting). 30 tablet 1   RESTASIS 0.05 % ophthalmic emulsion Place 1 drop into both eyes 2 (two) times daily.     simvastatin (ZOCOR) 10 MG tablet Take 10 mg by mouth every evening.     topiramate (TOPAMAX) 50 MG tablet Take 50 mg by mouth every evening.     Triamcinolone Acetonide (NASACORT ALLERGY 24HR NA) Place 1 spray into the nose daily as needed (sinus congestion).     vitamin E 180 MG (400 UNITS) capsule Take 400 Units by mouth in the morning.     No current facility-administered medications for this encounter.    ECOG PERFORMANCE STATUS:  0 - Asymptomatic  REVIEW OF SYSTEMS: Patient has a history of DCIS of the breast status post lumpectomy and radiation.  Delayed uterine emptying.  Chronic loose bowel secondary Milk of Magnesia use Patient denies any weight loss, fatigue, weakness, fever, chills or night sweats. Patient denies any loss of vision, blurred vision. Patient denies any ringing  of the ears or hearing loss. No irregular heartbeat. Patient denies heart murmur or history of fainting. Patient denies any chest pain or pain radiating to her upper extremities. Patient denies any shortness of breath, difficulty breathing at night, cough or hemoptysis. Patient denies any swelling in the lower legs. Patient denies any nausea vomiting, vomiting of blood, or coffee ground material in the vomitus. Patient denies any stomach pain. Patient states has had normal bowel movements no significant constipation or diarrhea. Patient denies any dysuria, hematuria or significant nocturia. Patient denies any problems walking, swelling in the joints or loss of balance. Patient denies any skin changes, loss of hair or loss of weight. Patient denies any excessive worrying or anxiety or significant depression. Patient  denies any problems with insomnia. Patient denies excessive thirst, polyuria, polydipsia. Patient denies any swollen glands, patient denies easy bruising or easy bleeding. Patient denies any recent infections, allergies or URI. Patient "s visual fields have not changed significantly in recent time.   PHYSICAL EXAM: BP (!) 165/75   Pulse 77   Wt 103 lb 6.4 oz (46.9 kg)   BMI 18.91 kg/m  Well-developed well-nourished patient in NAD. HEENT reveals PERLA, EOMI, discs not visualized.  Oral cavity is clear. No oral mucosal lesions are identified. Neck is clear without evidence of cervical or supraclavicular adenopathy. Lungs are clear to A&P. Cardiac examination is essentially unremarkable with regular rate and rhythm without murmur rub or thrill. Abdomen is benign with no organomegaly or masses noted. Motor sensory and DTR levels are equal and symmetric in the upper and lower extremities. Cranial nerves II through XII are grossly intact. Proprioception is intact. No peripheral adenopathy or edema  is identified. No motor or sensory levels are noted. Crude visual fields are within normal range.  LABORATORY DATA: Pathology reports reviewed    RADIOLOGY RESULTS: CT scans reviewed compatible with above-stated findings   IMPRESSION: Stage Ia carcinosarcoma of the uterus status post TAH/BSO and lymph node analysis in 81 year old female  PLAN: At this time patient is completing her CarboTaxol chemotherapy which she is tolerated fairly well.  Based onEvidence of the clinical significance of vaginal brachytherapy in patients with uterine carcinosarcoma In multicenter retrospective study KR [id number] I believe it is reasonable to add vaginal brachytherapy.  Would plan on delivering 23 Gray in 5 fractions using high-dose-rate remote afterloading.  Risks and benefits of treatment occluding possible increased lower urinary tract symptoms diarrhea fatigue vaginal stenosis all were described in detail to the patient and  her son and daughter.  They all seem to comprehend my treatment plan well.  I have put off her simulation till she completes her chemotherapy in about 3 weeks.  We will follow-up with Dr. Fransisca Connors and go over my treatment plan in the near future.  I would like to take this opportunity to thank you for allowing me to participate in the care of your patient.Noreene Filbert, MD

## 2022-05-04 ENCOUNTER — Other Ambulatory Visit: Payer: Self-pay | Admitting: *Deleted

## 2022-05-04 DIAGNOSIS — D509 Iron deficiency anemia, unspecified: Secondary | ICD-10-CM

## 2022-05-04 DIAGNOSIS — E538 Deficiency of other specified B group vitamins: Secondary | ICD-10-CM

## 2022-05-04 DIAGNOSIS — C541 Malignant neoplasm of endometrium: Secondary | ICD-10-CM

## 2022-05-04 NOTE — Progress Notes (Signed)
Orders for lab

## 2022-05-05 ENCOUNTER — Inpatient Hospital Stay (HOSPITAL_BASED_OUTPATIENT_CLINIC_OR_DEPARTMENT_OTHER): Payer: PPO | Admitting: Oncology

## 2022-05-05 ENCOUNTER — Inpatient Hospital Stay: Payer: PPO

## 2022-05-05 ENCOUNTER — Inpatient Hospital Stay: Payer: PPO | Attending: Obstetrics and Gynecology

## 2022-05-05 ENCOUNTER — Encounter: Payer: Self-pay | Admitting: Oncology

## 2022-05-05 VITALS — BP 142/75 | HR 73 | Temp 97.4°F | Resp 16 | Wt 102.9 lb

## 2022-05-05 DIAGNOSIS — D6481 Anemia due to antineoplastic chemotherapy: Secondary | ICD-10-CM

## 2022-05-05 DIAGNOSIS — C541 Malignant neoplasm of endometrium: Secondary | ICD-10-CM | POA: Insufficient documentation

## 2022-05-05 DIAGNOSIS — Z5111 Encounter for antineoplastic chemotherapy: Secondary | ICD-10-CM

## 2022-05-05 DIAGNOSIS — T451X5A Adverse effect of antineoplastic and immunosuppressive drugs, initial encounter: Secondary | ICD-10-CM | POA: Diagnosis not present

## 2022-05-05 DIAGNOSIS — Z79899 Other long term (current) drug therapy: Secondary | ICD-10-CM | POA: Insufficient documentation

## 2022-05-05 DIAGNOSIS — D509 Iron deficiency anemia, unspecified: Secondary | ICD-10-CM

## 2022-05-05 DIAGNOSIS — G620 Drug-induced polyneuropathy: Secondary | ICD-10-CM | POA: Diagnosis not present

## 2022-05-05 DIAGNOSIS — E538 Deficiency of other specified B group vitamins: Secondary | ICD-10-CM

## 2022-05-05 LAB — CBC WITH DIFFERENTIAL/PLATELET
Abs Immature Granulocytes: 0.02 10*3/uL (ref 0.00–0.07)
Basophils Absolute: 0 10*3/uL (ref 0.0–0.1)
Basophils Relative: 1 %
Eosinophils Absolute: 0 10*3/uL (ref 0.0–0.5)
Eosinophils Relative: 0 %
HCT: 29.8 % — ABNORMAL LOW (ref 36.0–46.0)
Hemoglobin: 10.1 g/dL — ABNORMAL LOW (ref 12.0–15.0)
Immature Granulocytes: 0 %
Lymphocytes Relative: 17 %
Lymphs Abs: 0.8 10*3/uL (ref 0.7–4.0)
MCH: 34.2 pg — ABNORMAL HIGH (ref 26.0–34.0)
MCHC: 33.9 g/dL (ref 30.0–36.0)
MCV: 101 fL — ABNORMAL HIGH (ref 80.0–100.0)
Monocytes Absolute: 0.8 10*3/uL (ref 0.1–1.0)
Monocytes Relative: 17 %
Neutro Abs: 2.9 10*3/uL (ref 1.7–7.7)
Neutrophils Relative %: 65 %
Platelets: 227 10*3/uL (ref 150–400)
RBC: 2.95 MIL/uL — ABNORMAL LOW (ref 3.87–5.11)
RDW: 15.8 % — ABNORMAL HIGH (ref 11.5–15.5)
WBC: 4.5 10*3/uL (ref 4.0–10.5)
nRBC: 0 % (ref 0.0–0.2)

## 2022-05-05 LAB — COMPREHENSIVE METABOLIC PANEL
ALT: 27 U/L (ref 0–44)
AST: 29 U/L (ref 15–41)
Albumin: 3.8 g/dL (ref 3.5–5.0)
Alkaline Phosphatase: 133 U/L — ABNORMAL HIGH (ref 38–126)
Anion gap: 8 (ref 5–15)
BUN: 15 mg/dL (ref 8–23)
CO2: 21 mmol/L — ABNORMAL LOW (ref 22–32)
Calcium: 8.8 mg/dL — ABNORMAL LOW (ref 8.9–10.3)
Chloride: 101 mmol/L (ref 98–111)
Creatinine, Ser: 0.83 mg/dL (ref 0.44–1.00)
GFR, Estimated: >60 mL/min (ref >60)
Glucose, Bld: 102 mg/dL — ABNORMAL HIGH (ref 70–99)
Potassium: 4.2 mmol/L (ref 3.5–5.1)
Sodium: 130 mmol/L — ABNORMAL LOW (ref 135–145)
Total Bilirubin: 0.6 mg/dL (ref 0.3–1.2)
Total Protein: 6.3 g/dL — ABNORMAL LOW (ref 6.5–8.1)

## 2022-05-05 LAB — IRON AND TIBC
Iron: 106 ug/dL (ref 28–170)
Saturation Ratios: 45 % — ABNORMAL HIGH (ref 10.4–31.8)
TIBC: 234 ug/dL — ABNORMAL LOW (ref 250–450)
UIBC: 128 ug/dL

## 2022-05-05 LAB — FERRITIN: Ferritin: 458 ng/mL — ABNORMAL HIGH (ref 11–307)

## 2022-05-05 MED ORDER — PALONOSETRON HCL INJECTION 0.25 MG/5ML
0.2500 mg | Freq: Once | INTRAVENOUS | Status: AC
  Administered 2022-05-05: 0.25 mg via INTRAVENOUS
  Filled 2022-05-05: qty 5

## 2022-05-05 MED ORDER — SODIUM CHLORIDE 0.9 % IV SOLN
230.4000 mg | Freq: Once | INTRAVENOUS | Status: AC
  Administered 2022-05-05: 230 mg via INTRAVENOUS
  Filled 2022-05-05: qty 23

## 2022-05-05 MED ORDER — SODIUM CHLORIDE 0.9 % IV SOLN
150.0000 mg | Freq: Once | INTRAVENOUS | Status: AC
  Administered 2022-05-05: 150 mg via INTRAVENOUS
  Filled 2022-05-05: qty 5

## 2022-05-05 MED ORDER — SODIUM CHLORIDE 0.9 % IV SOLN
135.0000 mg/m2 | Freq: Once | INTRAVENOUS | Status: AC
  Administered 2022-05-05: 192 mg via INTRAVENOUS
  Filled 2022-05-05: qty 32

## 2022-05-05 MED ORDER — FAMOTIDINE IN NACL 20-0.9 MG/50ML-% IV SOLN
20.0000 mg | Freq: Once | INTRAVENOUS | Status: AC
  Administered 2022-05-05: 20 mg via INTRAVENOUS
  Filled 2022-05-05: qty 50

## 2022-05-05 MED ORDER — DIPHENHYDRAMINE HCL 50 MG/ML IJ SOLN
50.0000 mg | Freq: Once | INTRAMUSCULAR | Status: AC
  Administered 2022-05-05: 50 mg via INTRAVENOUS
  Filled 2022-05-05: qty 1

## 2022-05-05 MED ORDER — HEPARIN SOD (PORK) LOCK FLUSH 100 UNIT/ML IV SOLN
INTRAVENOUS | Status: AC
  Administered 2022-05-05: 500 [IU]
  Filled 2022-05-05: qty 5

## 2022-05-05 MED ORDER — HEPARIN SOD (PORK) LOCK FLUSH 100 UNIT/ML IV SOLN
500.0000 [IU] | Freq: Once | INTRAVENOUS | Status: AC | PRN
  Filled 2022-05-05: qty 5

## 2022-05-05 MED ORDER — SODIUM CHLORIDE 0.9 % IV SOLN
Freq: Once | INTRAVENOUS | Status: AC
  Filled 2022-05-05: qty 250

## 2022-05-05 MED ORDER — SODIUM CHLORIDE 0.9 % IV SOLN
10.0000 mg | Freq: Once | INTRAVENOUS | Status: AC
  Administered 2022-05-05: 10 mg via INTRAVENOUS
  Filled 2022-05-05: qty 1

## 2022-05-05 NOTE — Patient Instructions (Signed)
[facility]  Discharge Instructions: Thank you for choosing [facility] to provide your oncology and hematology care.  If you have a lab appointment with the Cancer Center, please go directly to the Cancer Center and check in at the registration area.  Wear comfortable clothing and clothing appropriate for easy access to any Portacath or PICC line.   We strive to give you quality time with your provider. You may need to reschedule your appointment if you arrive late (15 or more minutes).  Arriving late affects you and other patients whose appointments are after yours.  Also, if you miss three or more appointments without notifying the office, you may be dismissed from the clinic at the provider's discretion.      For prescription refill requests, have your pharmacy contact our office and allow 72 hours for refills to be completed.    Today you received the following chemotherapy and/or immunotherapy agents Taxol and Carboplatin       To help prevent nausea and vomiting after your treatment, we encourage you to take your nausea medication as directed.  BELOW ARE SYMPTOMS THAT SHOULD BE REPORTED IMMEDIATELY: *FEVER GREATER THAN 100.4 F (38 C) OR HIGHER *CHILLS OR SWEATING *NAUSEA AND VOMITING THAT IS NOT CONTROLLED WITH YOUR NAUSEA MEDICATION *UNUSUAL SHORTNESS OF BREATH *UNUSUAL BRUISING OR BLEEDING *URINARY PROBLEMS (pain or burning when urinating, or frequent urination) *BOWEL PROBLEMS (unusual diarrhea, constipation, pain near the anus) TENDERNESS IN MOUTH AND THROAT WITH OR WITHOUT PRESENCE OF ULCERS (sore throat, sores in mouth, or a toothache) UNUSUAL RASH, SWELLING OR PAIN  UNUSUAL VAGINAL DISCHARGE OR ITCHING   Items with * indicate a potential emergency and should be followed up as soon as possible or go to the Emergency Department if any problems should occur.  Please show the CHEMOTHERAPY ALERT CARD or IMMUNOTHERAPY ALERT CARD at  check-in to the Emergency Department and triage nurse.  Should you have questions after your visit or need to cancel or reschedule your appointment, please contact [facility]  [phone] and follow the prompts.  Office hours are 8:00 a.m. to 4:30 p.m. Monday - Friday. Please note that voicemails left after 4:00 p.m. may not be returned until the following business day.  We are closed weekends and major holidays. You have access to a nurse at all times for urgent questions. Please call the main number to the clinic [phone] and follow the prompts.  For any non-urgent questions, you may also contact your provider using MyChart. We now offer e-Visits for anyone 18 and older to request care online for non-urgent symptoms. For details visit [email or web address].   Also download the MyChart app! Go to the app store, search "MyChart", open the app, select [facility], and log in with your MyChart username and password.  Masks are optional in the cancer centers. If you would like for your care team to wear a mask while they are taking care of you, please let them know. For doctor visits, patients may have with them one support person who is at least 82 years old. At this time, visitors are not allowed in the infusion area.  

## 2022-05-05 NOTE — Progress Notes (Signed)
Hematology/Oncology Consult note [facility]  [phone] [phone]  Patient Care Team: Adin Hector, MD as PCP - General (Internal Medicine) Rockwell Germany, RN as Oncology Nurse Navigator Mauro Kaufmann, RN as Oncology Nurse Navigator Rolm Bookbinder, MD as Consulting Physician (General Surgery) Eppie Gibson, MD as Attending Physician (Radiation Oncology) Rubie Maid, MD as Consulting Physician (Obstetrics and Gynecology) Clent Jacks, RN as Oncology Nurse Navigator Sindy Guadeloupe, MD as Consulting Physician (Oncology)   Name of the patient: Elizabeth Friedman  [id number]  06/18/1941   Date of visit: 05/05/22  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  Initially she saw GI but given her history of pessary placement for 20 years she decided to see GYN.  She had an ultrasound pelvis which showed mixed echogenicity mass in the endometrial canal measuring 3.7 x 4.1 x 3.3 cm.  She was also having some vaginal spotting and brown discharge.  She underwent hysteroscopy with removal of endometrial mass and D&C which showed carcinosarcoma/malignant mixed mllerian tumor.   CT chest abdomen and pelvis showed enhancing lesion in the anterior fundus but no other evidence of distant metastatic disease in chest abdomen and pelvis.   Patient also has a prior history of right breast DCIS s/p lumpectomy and radiation treatment in 2021.  She was following up with Dr. Jana Hakim at [place] previously.  She is not on any endocrine therapy for the same.   Patient underwent total laparoscopic hysterectomy with bilateral salpingo-oophorectomy and bilateral pelvic sentinel lymph node injection mapping and biopsies on  12/24/2021.  Final pathology showed a 3.9 cm carcinosarcoma.  Less than 50% myometrial invasion.  Uterine serosa not identified.  Malignant cells were present in the peritoneal/ascites fluid.  Lymphatic invasion not identified.  Margins negative.  2 sentinel lymph nodes on the right and left iliac were negative for malignancy.  No evidence of malignancy in the fallopian tubes.  Cystadenofibroma noted in the ovaries pT1a pN0 FIGO stage IA   Plan is to proceed with adjuvant CarboTaxol chemotherapy every 3 weeks x6 cycles if patient can tolerate it especially given positive pelvic washings.  Followed by vaginal brachytherapy     Interval history-patient has tolerated chemotherapy well so far.  Neuropathy has remained stable in the last couple of cycles after lowering the dose of chemotherapy.  Has ongoing fatigue.  She is on gabapentin twice a day for her neuropathy.  ECOG PS- 1 Pain scale- 0   Review of systems- Review of Systems  Constitutional:  Positive for malaise/fatigue. Negative for chills, fever and weight loss.  HENT:  Negative for congestion, ear discharge and nosebleeds.   Eyes:  Negative for blurred vision.  Respiratory:  Negative for cough, hemoptysis, sputum production, shortness of breath and wheezing.   Cardiovascular:  Negative for chest pain, palpitations, orthopnea and claudication.  Gastrointestinal:  Negative for abdominal pain, blood in stool, constipation, diarrhea, heartburn, melena, nausea and vomiting.  Genitourinary:  Negative for dysuria, flank pain, frequency, hematuria and urgency.  Musculoskeletal:  Negative for back pain, joint pain and myalgias.  Skin:  Negative for rash.  Neurological:  Positive for sensory change (Peripheral neuropathy). Negative for dizziness, tingling, focal weakness, seizures, weakness and headaches.  Endo/Heme/Allergies:  Does not bruise/bleed easily.  Psychiatric/Behavioral:  Negative for depression and suicidal ideas. The patient does not  have insomnia.       No Known Allergies   Past Medical History:  Diagnosis Date   Allergic rhinitis    Anemia    Anginal pain (HCC)    Arthritis    Atypical chest pain    Breast cancer ([place])    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[place])    Ductal carcinoma in situ (DCIS) of right breast    Fibrocystic breast disease    GERD (gastroesophageal reflux disease)    Hearing aid worn    bilateral   History of hiatal hernia    Hyperlipidemia    Lung nodule    Menopause    Migraine    Migraine    optical migraines   Motion sickness    Plantar fasciitis    Prolapse of female pelvic organs    hodge pessary   Raynaud's disease    Sciatic leg pain    Sciatica    Skin cancer of face    Urinary retention with incomplete bladder emptying    Uterine carcinosarcoma ([place]) 11/2021   Vitreous detachment of both eyes      Past Surgical History:  Procedure Laterality Date   APPENDECTOMY  1951   BREAST LUMPECTOMY WITH RADIOACTIVE SEED LOCALIZATION Right 05/29/2020   Procedure: RIGHT BREAST LUMPECTOMY WITH RADIOACTIVE SEED LOCALIZATION;  Surgeon: Rolm Bookbinder, MD;  Location: [place];  Service: General;  Laterality: Right;   CARDIAC CATHETERIZATION  2004   negative   CATARACT EXTRACTION W/PHACO Right 02/26/2021   Procedure: CATARACT EXTRACTION PHACO AND INTRAOCULAR LENS PLACEMENT (I[facility]) RIGHT 5.86 01:19.6 7.4%;  Surgeon: Leandrew Koyanagi, MD;  Location: [place];  Service: Ophthalmology;  Laterality: Right;   CATARACT EXTRACTION W/PHACO Left 09/03/2021   Procedure: CATARACT EXTRACTION PHACO AND INTRAOCULAR LENS PLACEMENT ([place]) LEFT;  Surgeon: Leandrew Koyanagi, MD;  Location: [place];  Service: Ophthalmology;  Laterality: Left;  9.61 01:15.9   COLONOSCOPY     2000, 2006, 2012   COLONOSCOPY WITH PROPOFOL N/A 03/27/2016   Procedure: COLONOSCOPY WITH PROPOFOL;  Surgeon: Manya Silvas, MD;  Location: [facility] ENDOSCOPY;  Service: Endoscopy;  Laterality: N/A;    COLONOSCOPY WITH PROPOFOL N/A 08/14/2020   Procedure: COLONOSCOPY WITH PROPOFOL;  Surgeon: Toledo, Benay Pike, MD;  Location: [facility] ENDOSCOPY;  Service: Gastroenterology;  Laterality: N/A;   CYSTOSCOPY N/A 12/24/2021   Procedure: CYSTOSCOPY;  Surgeon: Jaquita Folds, MD;  Location: [facility];  Service: Gynecology;  Laterality: N/A;   DILATATION & CURETTAGE/HYSTEROSCOPY WITH MYOSURE N/A 12/01/2021   Procedure: DILATATION & CURETTAGE/HYSTEROSCOPY WITH MYOSURE;  Surgeon: Rubie Maid, MD;  Location: [facility];  Service: Gynecology;  Laterality: N/A;   ESOPHAGOGASTRODUODENOSCOPY  08/1999   ESOPHAGOGASTRODUODENOSCOPY (EGD) WITH PROPOFOL N/A 08/14/2020   Procedure: ESOPHAGOGASTRODUODENOSCOPY (EGD) WITH PROPOFOL;  Surgeon: Toledo, Benay Pike, MD;  Location: [facility] ENDOSCOPY;  Service: Gastroenterology;  Laterality: N/A;   ESOPHAGOGASTRODUODENOSCOPY (EGD) WITH PROPOFOL N/A 03/21/2021   Procedure: ESOPHAGOGASTRODUODENOSCOPY (EGD) WITH PROPOFOL;  Surgeon: Toledo, Benay Pike, MD;  Location: [facility] ENDOSCOPY;  Service: Gastroenterology;  Laterality: N/A;   PORTA CATH INSERTION N/A 01/15/2022   Procedure: PORTA CATH INSERTION;  Surgeon: Algernon Huxley, MD;  Location: [place] [facility];  Service: Cardiovascular;  Laterality: N/A;   SKIN CANCER EXCISION     face and neck   TOTAL LAPAROSCOPIC HYSTERECTOMY WITH BILATERAL SALPINGO OOPHORECTOMY Bilateral 12/24/2021   Procedure: TOTAL LAPAROSCOPIC HYSTERECTOMY WITH BILATERAL SALPINGO OOPHORECTOMY, PELVIC NODE BIOPSY,  PESSARY  REMOVAL (PESSARY REMOVAL BY DR. Wannetta Sender;  Surgeon: Mellody Drown, MD;  Location: [facility];  Service: Gynecology;  Laterality: Bilateral;    Social History   Socioeconomic History   Marital status: Widowed    Spouse name: Not on file   Number of children: Not on file   Years of education: Not on file   Highest education level: Not on file  Occupational History   Not on file  Tobacco Use   Smoking status: Never   Smokeless tobacco:  Never  Vaping Use   Vaping Use: Never used  Substance and Sexual Activity   Alcohol use: No   Drug use: No   Sexual activity: Not Currently    Birth control/protection: Post-menopausal  Other Topics Concern   Not on file  Social History Narrative   Lives alone   Social Determinants of Health   Financial Resource Strain: Not on file  Food Insecurity: Not on file  Transportation Needs: Not on file  Physical Activity: Sufficiently Active (12/21/2017)   Exercise Vital Sign    Days of Exercise per Week: 3 days    Minutes of Exercise per Session: 60 min  Stress: Not on file  Social Connections: Not on file  Intimate Partner Violence: Not on file    Family History  Problem Relation Age of Onset   Breast cancer Mother    Heart disease Father    Colon cancer Brother    Diabetes Neg Hx    Ovarian cancer Neg Hx      Current Outpatient Medications:    acetaminophen (TYLENOL) 500 MG tablet, Take 1,000 mg by mouth every 6 (six) hours as needed for moderate pain or mild pain., Disp: , Rfl:    alum & mag hydroxide-simeth (MAALOX/MYLANTA) 200-200-20 MG/5ML suspension, Take 30 mLs by mouth every 6 (six) hours as needed for indigestion or heartburn., Disp: , Rfl:    azithromycin (ZITHROMAX) 250 MG tablet, Take 250 mg by mouth daily., Disp: , Rfl:    Calcium-Vitamin D (CALTRATE 600 PLUS-VIT D PO), Take 1 tablet by mouth in the morning., Disp: , Rfl:    Carboxymethylcellulose Sod PF 1 % GEL, Place 1 drop into both eyes at bedtime., Disp: , Rfl:    celecoxib (CELEBREX) 200 MG capsule, Take 200 mg by mouth 2 (two) times daily., Disp: , Rfl:    Cholecalciferol (VITAMIN D) 125 MCG (5000 UT) CAPS, Take 5,000 Units by mouth in the morning., Disp: , Rfl:    Coenzyme Q10 200 MG TABS, Take 200 mg by mouth in the morning., Disp: , Rfl:    Cyanocobalamin (VITAMIN B-12 PO), Take 1 tablet by mouth in the morning., Disp: , Rfl:    dexamethasone (DECADRON) 4 MG tablet, Take 2 tablets by mouth once a day  starting the day after chemotherapy. Continue for 3 days total. Take with food., Disp: 30 tablet, Rfl: 1   gabapentin (NEURONTIN) 100 MG capsule, Take 100 mg by mouth 2 (two) times daily., Disp: , Rfl:    gabapentin (NEURONTIN) 300 MG capsule, Take 1 capsule (300 mg total) by mouth 2 (two) times daily., Disp: 60 capsule, Rfl: 0   Iron-Vitamin C (VITRON-C) 65-125 MG TABS, Take 1 tablet by mouth in the morning., Disp: , Rfl:    lidocaine-prilocaine (EMLA) cream, Apply to affected area once, Disp: 30 g, Rfl: 3   LORazepam (ATIVAN) 0.5 MG tablet, Take 1 tablet (0.5 mg total) by mouth every 6 (six) hours as needed (Nausea or vomiting). (Patient not  taking: Reported on 04/14/2022), Disp: 30 tablet, Rfl: 0   Omega-3 Fatty Acids (FISH OIL PO), Take 1,400 mg by mouth in the morning., Disp: , Rfl:    omeprazole (PRILOSEC) 40 MG capsule, Take 40 mg by mouth daily in the afternoon. (Patient not taking: Reported on 03/02/2022), Disp: , Rfl:    ondansetron (ZOFRAN) 8 MG tablet, Take 1 tablet (8 mg total) by mouth 2 (two) times daily as needed. Start on the third day after chemotherapy. (Patient not taking: Reported on 02/09/2022), Disp: 30 tablet, Rfl: 1   pantoprazole (PROTONIX) 40 MG tablet, Take 40 mg by mouth in the morning., Disp: , Rfl:    prochlorperazine (COMPAZINE) 10 MG tablet, Take 1 tablet (10 mg total) by mouth every 6 (six) hours as needed (Nausea or vomiting)., Disp: 30 tablet, Rfl: 1   RESTASIS 0.05 % ophthalmic emulsion, Place 1 drop into both eyes 2 (two) times daily., Disp: , Rfl:    simvastatin (ZOCOR) 10 MG tablet, Take 10 mg by mouth every evening., Disp: , Rfl:    topiramate (TOPAMAX) 50 MG tablet, Take 50 mg by mouth every evening., Disp: , Rfl:    Triamcinolone Acetonide (NASACORT ALLERGY 24HR NA), Place 1 spray into the nose daily as needed (sinus congestion)., Disp: , Rfl:    vitamin E 180 MG (400 UNITS) capsule, Take 400 Units by mouth in the morning., Disp: , Rfl:   Physical exam:   Vitals:   05/05/22 0831  BP: (!) 142/75  Pulse: 73  Resp: 16  Temp: (!) 97.4 F (36.3 C)  SpO2: 100%  Weight: 102 lb 14.4 oz (46.7 kg)   Physical Exam Constitutional:      General: She is not in acute distress. Cardiovascular:     Rate and Rhythm: Normal rate and regular rhythm.     Heart sounds: Normal heart sounds.  Pulmonary:     Effort: Pulmonary effort is normal.  Skin:    General: Skin is warm and dry.  Neurological:     Mental Status: She is alert and oriented to person, place, and time.         Latest Ref Rng & Units 04/14/2022    7:55 AM  CMP  Glucose 70 - 99 mg/dL 104   BUN 8 - 23 mg/dL 17   Creatinine 0.44 - 1.00 mg/dL 0.90   Sodium 135 - 145 mmol/L 133   Potassium 3.5 - 5.1 mmol/L 4.4   Chloride 98 - 111 mmol/L 103   CO2 22 - 32 mmol/L 21   Calcium 8.9 - 10.3 mg/dL 8.9   Total Protein 6.5 - 8.1 g/dL 7.0   Total Bilirubin 0.3 - 1.2 mg/dL 0.3   Alkaline Phos 38 - 126 U/L 134   AST 15 - 41 U/L 25   ALT 0 - 44 U/L 18       Latest Ref Rng & Units 04/14/2022    7:55 AM  CBC  WBC 4.0 - 10.5 K/uL 4.7   Hemoglobin 12.0 - 15.0 g/dL 10.4   Hematocrit 36.0 - 46.0 % 31.4   Platelets 150 - 400 K/uL 284      Assessment and plan- Patient is a 81 y.o. female  with stage I apT1 aN0 M0 carcinosarcoma of the uterus.  She is here for on treatment assessment prior to cycle 6 of CarboTaxol chemotherapy  Counts okay to proceed with cycle 6 of CarboTaxol chemotherapy today which will be her last cycle.  She does not require  Udenyca with this cycle.  She has been receiving carboplatin and AUC 4 and Taxol at 135 mg per metered squared given her existing neuropathy.  Normocytic anemia: Secondary to chemotherapy.  She has received IV iron in the past as well.  Continue to monitor  Chemo-induced peripheral neuropathy: On gabapentin twice daily.  I will refer her to acupuncture at this time  She has been seen by radiation oncology to start vaginal brachytherapy after  completion of this cycle.  I will plan to see her in 3 months from now with CBC with differential CMP and CA125.  She will also follow-up with GYN oncology.  Patient would like to keep her port in place and we will get it flushed every 3 months   Visit Diagnosis 1. Encounter for antineoplastic chemotherapy   2. Chemotherapy-induced peripheral neuropathy ([place])   3. Antineoplastic chemotherapy induced anemia   4. Endometrial cancer [facility])      Dr. Randa Evens, MD, MPH [facility] [phone] 05/05/2022 8:54 AM

## 2022-05-06 LAB — CA 125: Cancer Antigen (CA) 125: 12.6 U/mL (ref 0.0–38.1)

## 2022-05-07 ENCOUNTER — Inpatient Hospital Stay: Payer: PPO

## 2022-05-13 ENCOUNTER — Inpatient Hospital Stay (HOSPITAL_BASED_OUTPATIENT_CLINIC_OR_DEPARTMENT_OTHER): Payer: PPO | Admitting: Obstetrics and Gynecology

## 2022-05-13 ENCOUNTER — Encounter: Payer: Self-pay | Admitting: Obstetrics and Gynecology

## 2022-05-13 VITALS — BP 139/76 | HR 74 | Temp 98.1°F | Resp 18 | Ht 62.0 in | Wt 104.0 lb

## 2022-05-13 DIAGNOSIS — C541 Malignant neoplasm of endometrium: Secondary | ICD-10-CM

## 2022-05-13 DIAGNOSIS — Z5111 Encounter for antineoplastic chemotherapy: Secondary | ICD-10-CM | POA: Diagnosis not present

## 2022-05-13 NOTE — Progress Notes (Signed)
Gynecologic Oncology Interval Visit   Referring Provider: Dr. Marcelline Mates  Chief Complaint: Carcinosarcoma  Subjective:  Elizabeth Friedman is a 81 y.o. 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   She has completed 6 cycles of adjuvant carboplatin/taxol chemotherapy last week and tolerated well.  Will be starting vaginal brachytherapy with Dr Baruch Gouty.  Neuropathy has remained stable in the last couple of cycles after lowering the dose of chemotherapy.  Has ongoing fatigue.  She is on gabapentin twice a day for her neuropathy. Has some diarrhea and face rash.   Gynecologic Oncology History  Elizabeth Friedman is a pleasant female who is seen in consultation from Dr. Marcelline Mates for lower pelvic pain x 1 month and uterine carcinosarcoma.   US Pelvis  The uterus is anteverted and measures 9.6 X 7.1 X 5.5 cm. Echo texture is heterogenous with evidence of focal mass in the Endometrium.   The Endometrium measures 35 mm.  There is an echogenic,  mixed echogenicity mass in the endometrial canal ,measuring: 3.7 x 4.1 x 3.3 cm; This is hyperemic and has a small amount of fluid surrounding it within the endometrial cavity.   Right Ovary is not visualized. Left Ovary measures 2.8 x  2.7 x 1.5 cm. It is normal in appearance. Survey of the adnexae demonstrates no adnexal masses. There is no free fluid in the cul de sac.  She began having vaginal spotting and brown discharge. Abdominal pain increased.   Underwent hysteroscopy with removal of endometrial mass and D&C.   DIAGNOSIS: A. ENDOMETRIUM; CURETTAGE: - CARCINOSARCOMA / MALIGNANT MIXED MULLERIAN TUMOR.   B. ENDOMETRIAL MASS; CURETTAGE: - CARCINOSARCOMA / MALIGNANT MIXED MULLERIAN TUMOR.  MMRp on IHC ER/PR+  P53 wild type  She had total laparoscopic hysterectomy with bilateral salpingo-oophorectomy and bilateral pelvic sentinel lymph node injection and mapping with biopsies on 12/24/2021. Additionally, underwent concurrent  laproscopic uterosacral suspension and perineal repair with Dr. Lucile Shutters for hx of Gehrung pessary/permanent pessary (20 years). Case was discussed at tumor board and adjuvant carbo-taxol x 6 cycles was recommended.    DIAGNOSIS:  A. SENTINEL LYMPH NODE, LEFT EXTERNAL ILIAC; EXCISIONAL BIOPSY:  - ONE LYMPH NODE, NEGATIVE FOR MALIGNANCY (0/1).   B. SENTINEL LYMPH NODE, RIGHT EXTERNAL ILIAC; EXCISIONAL BIOPSY:  - ONE LYMPH NODE, NEGATIVE FOR MALIGNANCY (0/1).   C. UTERUS AND CERVIX WITH BILATERAL FALLOPIAN TUBES AND OVARIES; TOTAL  HYSTERECTOMY WITH BILATERAL SALPINGO-OOPHORECTOMY:  - ENDOMETRIUM:       - CARCINOSARCOMA / MALIGNANT MIXED MULLERIAN TUMOR.       - SEE CANCER SUMMARY BELOW.  - MYOMETRIUM:       - LEIOMYOMA.  - UTERINE SEROSA:       - ENDOMETRIOSIS.  - FALLOPIAN TUBES:       - NO SIGNIFICANT PATHOLOGIC ALTERATION.  - OVARIES:       - CYSTADENOFIBROMA (BILATERAL).   CASE SUMMARY: (ENDOMETRIUM)  Standard(s): AJCC-UICC 8, FIGO Cancer Report 2018   SPECIMEN  Procedure: Total hysterectomy with bilateral salpingo-oophorectomy   TUMOR  Tumor Size: Greatest dimension: 3.9 cm  Histologic Type: Carcinosarcoma  Histologic Grade: Not applicable  Myometrial Invasion: Present, less than 50%  Uterine Serosa Involvement: Not identified  Cervical Stromal Involvement: Not identified  Other Tissue/Organ Involvement: Not identified  Peritoneal/Ascitic Fluid: Malignant cells present  Lymphatic and/or Vascular Invasion: Not identified   MARGINS  Margin Status: All margins negative for invasive carcinoma   REGIONAL LYMPH NODES  Regional Lymph Node Status: Regional lymph nodes present, all  regional  lymph nodes negative for tumor   Lymph nodes examined:       Total number of pelvic nodes examined (sentinel and non-sentinel):  2       Number of pelvic sentinel nodes examined: 2       Total number of para-aortic nodes examined (sentinel and  non-sentinel): 0        Number of para-aortic sentinel nodes examined: 0   DISTANT METASTASIS  Distant Site(s) Involved, if applicable: Not applicable   PATHOLOGIC STAGE CLASSIFICATION (pTNM, AJCC 8th Edition): pT pN / FIGO  Modified Classification: Not applicable  pT 1a  T Suffix: Not applicable  Regional Lymph Nodes Modifier: sn  pN0  pM - Not applicable   FIGO Stage (2018 FIGO Cancer Report): IA   DIAGNOSIS:  A. PELVIC WASHINGS:  - DIAGNOSTIC OF MALIGNANCY.  - SEE COMMENT.    PMH significant for DCIS of right breast s/p lumpectomy and radiation in 2021. Patient previously followed by Dr. Magrinat at [facility] but has transferred care to Dr. Brahmanday at [facility] at [facility] and next appointment in May 2023.   She complains of persistent of GERD-like symptoms and epigastric discomfort since her hysteroscopy. Incomplete emptying of her bladder. She wears a pessary that has not been removed in years.   Her PCP recommended a pulmonary consultation given the CT pulmonary findings.   She had an abnormal EKG on 11/28/2021 that showed PVS, nonspecific ST and T wave abnormalities.   Problem List: Patient Active Problem List   Diagnosis Date Noted   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HCC) 01/23/2022   Postoperative state 12/24/2021   Endometrial cancer (HCC)    Bronchiectasis without complication (HCC) 12/12/2021   Lung granuloma (HCC) 12/10/2021   B12 deficiency 09/25/2021   History of ductal carcinoma in situ (DCIS) of breast 09/10/2020   Iron deficiency anemia 09/10/2020   Ductal carcinoma in situ (DCIS) of right breast 05/17/2020   Bilateral carotid artery stenosis 02/06/2020   Visual changes 01/12/2020   Carotid stenosis 01/11/2020   Migraine aura without headache 03/07/2019   Complete tear of left rotator cuff 03/10/2017   Pessary maintenance 12/18/2016   Rectocele 12/18/2016   Midline cystocele 12/18/2016   Menopausal syndrome on hormone replacement therapy 12/18/2016   Allergic rhinitis 12/17/2015    Atypical chest pain 12/17/2015   Bloodgood disease 12/17/2015   Acid reflux 12/17/2015   HLD (hyperlipidemia) 12/17/2015   Lung mass 12/17/2015   Uterine prolapse 12/17/2015   CA of skin 12/17/2015   Post menopausal syndrome 01/14/2015   Incomplete bladder emptying 01/14/2015   Neuralgia neuritis, sciatic nerve 01/14/2015   H/O malignant neoplasm of skin 09/17/2014    Past Medical History: 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HCC)    Ductal carcinoma in situ (DCIS) of right breast    Fibrocystic breast disease    GERD (gastroesophageal reflux disease)    Hearing aid worn    bilateral   History of hiatal hernia    Hyperlipidemia    Lung nodule    Menopause    Migraine    Migraine    optical migraines   Motion sickness    Plantar fasciitis    Prolapse of female pelvic organs    hodge pessary   Raynaud's disease      Sciatic leg pain    Sciatica    Skin cancer of face    Urinary retention with incomplete bladder emptying    Uterine carcinosarcoma ([place]) 11/2021   Vitreous detachment of both eyes     Past Surgical History: Past Surgical History:  Procedure Laterality Date   APPENDECTOMY  1951   BREAST LUMPECTOMY WITH RADIOACTIVE SEED LOCALIZATION Right 05/29/2020   Procedure: RIGHT BREAST LUMPECTOMY WITH RADIOACTIVE SEED LOCALIZATION;  Surgeon: Rolm Bookbinder, MD;  Location: [place];  Service: General;  Laterality: Right;   CARDIAC CATHETERIZATION  2004   negative   CATARACT EXTRACTION W/PHACO Right 02/26/2021   Procedure: CATARACT EXTRACTION PHACO AND INTRAOCULAR LENS PLACEMENT (I[facility]) RIGHT 5.86 01:19.6 7.4%;  Surgeon: Leandrew Koyanagi, MD;  Location: [place];  Service: Ophthalmology;  Laterality: Right;   CATARACT EXTRACTION W/PHACO Left 09/03/2021   Procedure: CATARACT EXTRACTION PHACO AND  INTRAOCULAR LENS PLACEMENT ([place]) LEFT;  Surgeon: Leandrew Koyanagi, MD;  Location: [facility];  Service: Ophthalmology;  Laterality: Left;  9.61 01:15.9   COLONOSCOPY     2000, 2006, 2012   COLONOSCOPY WITH PROPOFOL N/A 03/27/2016   Procedure: COLONOSCOPY WITH PROPOFOL;  Surgeon: Manya Silvas, MD;  Location: [facility] ENDOSCOPY;  Service: Endoscopy;  Laterality: N/A;   COLONOSCOPY WITH PROPOFOL N/A 08/14/2020   Procedure: COLONOSCOPY WITH PROPOFOL;  Surgeon: Toledo, Benay Pike, MD;  Location: [facility] ENDOSCOPY;  Service: Gastroenterology;  Laterality: N/A;   CYSTOSCOPY N/A 12/24/2021   Procedure: CYSTOSCOPY;  Surgeon: Jaquita Folds, MD;  Location: [facility];  Service: Gynecology;  Laterality: N/A;   DILATATION & CURETTAGE/HYSTEROSCOPY WITH MYOSURE N/A 12/01/2021   Procedure: DILATATION & CURETTAGE/HYSTEROSCOPY WITH MYOSURE;  Surgeon: Rubie Maid, MD;  Location: [facility];  Service: Gynecology;  Laterality: N/A;   ESOPHAGOGASTRODUODENOSCOPY  08/1999   ESOPHAGOGASTRODUODENOSCOPY (EGD) WITH PROPOFOL N/A 08/14/2020   Procedure: ESOPHAGOGASTRODUODENOSCOPY (EGD) WITH PROPOFOL;  Surgeon: Toledo, Benay Pike, MD;  Location: [facility] ENDOSCOPY;  Service: Gastroenterology;  Laterality: N/A;   ESOPHAGOGASTRODUODENOSCOPY (EGD) WITH PROPOFOL N/A 03/21/2021   Procedure: ESOPHAGOGASTRODUODENOSCOPY (EGD) WITH PROPOFOL;  Surgeon: Toledo, Benay Pike, MD;  Location: [facility];  Service: Gastroenterology;  Laterality: N/A;   PORTA CATH INSERTION N/A 01/15/2022   Procedure: PORTA CATH INSERTION;  Surgeon: Algernon Huxley, MD;  Location: [facility];  Service: Cardiovascular;  Laterality: N/A;   SKIN CANCER EXCISION     face and neck   TOTAL LAPAROSCOPIC HYSTERECTOMY WITH BILATERAL SALPINGO OOPHORECTOMY Bilateral 12/24/2021   Procedure: TOTAL LAPAROSCOPIC HYSTERECTOMY WITH BILATERAL SALPINGO OOPHORECTOMY, PELVIC NODE BIOPSY,  PESSARY REMOVAL (PESSARY REMOVAL BY DR. Wannetta Sender;  Surgeon: Mellody Drown, MD;  Location: [facility];  Service: Gynecology;  Laterality: Bilateral;    Past Gynecologic History:  As per HPI  OB History:  OB History  Gravida Para Term Preterm AB Living  _0 SAB IAB Ectopic Multiple Live Births          2    # Outcome Date GA Lbr Len/2nd Weight Sex Delivery Anes PTL Lv  2 Term 1964    F Vag-Spont   LIV  1 Term 1962    F Vag-Spont   LIV    Family History: Family History  Problem Relation Age of Onset   Breast cancer Mother    Heart disease Father    Colon cancer Brother    Diabetes Neg Hx    Ovarian cancer Neg Hx     Social History: Social  History   Socioeconomic History   Marital status: Widowed    Spouse name: Not on file   Number of children: Not on file   Years of education: Not on file   Highest education level: Not on file  Occupational History   Not on file  Tobacco Use   Smoking status: Never   Smokeless tobacco: Never  Vaping Use   Vaping Use: Never used  Substance and Sexual Activity   Alcohol use: No   Drug use: No   Sexual activity: Not Currently    Birth control/protection: Post-menopausal  Other Topics Concern   Not on file  Social History Narrative   Lives alone   Social Determinants of Health   Financial Resource Strain: Not on file  Food Insecurity: Not on file  Transportation Needs: Not on file  Physical Activity: Sufficiently Active (12/21/2017)   Exercise Vital Sign    Days of Exercise per Week: 3 days    Minutes of Exercise per Session: 60 min  Stress: Not on file  Social Connections: Not on file  Intimate Partner Violence: Not on file    Allergies: No Known Allergies  Current Medications: Current Outpatient Medications  Medication Sig Dispense Refill   acetaminophen (TYLENOL) 500 MG tablet Take 1,000 mg by mouth every 6 (six) hours as needed for moderate pain or mild pain.     alum & mag hydroxide-simeth (MAALOX/MYLANTA) 200-200-20 MG/5ML suspension Take 30 mLs by mouth every 6 (six)  hours as needed for indigestion or heartburn.     azithromycin (ZITHROMAX) 250 MG tablet Take 250 mg by mouth daily.     Calcium-Vitamin D (CALTRATE 600 PLUS-VIT D PO) Take 1 tablet by mouth in the morning.     Carboxymethylcellulose Sod PF 1 % GEL Place 1 drop into both eyes at bedtime.     celecoxib (CELEBREX) 200 MG capsule Take 200 mg by mouth 2 (two) times daily.     Cholecalciferol (VITAMIN D) 125 MCG (5000 UT) CAPS Take 5,000 Units by mouth in the morning.     Coenzyme Q10 200 MG TABS Take 200 mg by mouth in the morning.     Cyanocobalamin (VITAMIN B-12 PO) Take 1 tablet by mouth in the morning.     dexamethasone (DECADRON) 4 MG tablet Take 2 tablets by mouth once a day starting the day after chemotherapy. Continue for 3 days total. Take with food. 30 tablet 1   gabapentin (NEURONTIN) 100 MG capsule Take 100 mg by mouth 2 (two) times daily.     gabapentin (NEURONTIN) 300 MG capsule Take 1 capsule (300 mg total) by mouth 2 (two) times daily. 60 capsule 0   Iron-Vitamin C (VITRON-C) 65-125 MG TABS Take 1 tablet by mouth in the morning.     lidocaine-prilocaine (EMLA) cream Apply to affected area once 30 g 3   LORazepam (ATIVAN) 0.5 MG tablet Take 1 tablet (0.5 mg total) by mouth every 6 (six) hours as needed (Nausea or vomiting). (Patient not taking: Reported on 04/14/2022) 30 tablet 0   Omega-3 Fatty Acids (FISH OIL PO) Take 1,400 mg by mouth in the morning.     omeprazole (PRILOSEC) 40 MG capsule Take 40 mg by mouth daily in the afternoon. (Patient not taking: Reported on 03/02/2022)     ondansetron (ZOFRAN) 8 MG tablet Take 1 tablet (8 mg total) by mouth 2 (two) times daily as needed. Start on the third day after chemotherapy. (Patient not taking: Reported on 02/09/2022) 30 tablet 1  pantoprazole (PROTONIX) 40 MG tablet Take 40 mg by mouth in the morning.     prochlorperazine (COMPAZINE) 10 MG tablet Take 1 tablet (10 mg total) by mouth every 6 (six) hours as needed (Nausea or vomiting).  (Patient not taking: Reported on 05/05/2022) 30 tablet 1   RESTASIS 0.05 % ophthalmic emulsion Place 1 drop into both eyes 2 (two) times daily.     simvastatin (ZOCOR) 10 MG tablet Take 10 mg by mouth every evening.     topiramate (TOPAMAX) 50 MG tablet Take 50 mg by mouth every evening.     Triamcinolone Acetonide (NASACORT ALLERGY 24HR NA) Place 1 spray into the nose daily as needed (sinus congestion).     vitamin E 180 MG (400 UNITS) capsule Take 400 Units by mouth in the morning.     No current facility-administered medications for this visit.   Review of Systems General:  no complaints Skin: no complaints Eyes: no complaints Breasts: no complaints Pulmonary: no complaints Cardiac: no complaints Gastrointestinal: some diarrhea Genitourinary/Sexual: no complaints Ob/Gyn: no complaints Musculoskeletal: no complaints Hematology: no complaints Neurologic/Psych: no complaints  Objective:  Physical Examination:  BP 139/76 (BP Location: Left Arm, Patient Position: Sitting)   Pulse 74   Temp 98.1 F (36.7 C) (Tympanic)   Resp 18   Ht 5' 2" (1.575 m)   Wt 104 lb (47.2 kg)   SpO2 100%   BMI 19.02 kg/m     ECOG Performance Status: 0  GENERAL: Patient is a well appearing female in no acute distress LUNGS:  Clear to auscultation bilaterally.   HEART:  Regular rate and rhythm.  ABDOMEN:  Soft, nontender.  No hernias, incisions well healed. No masses or ascites EXTREMITIES:  No peripheral edema. Atraumatic. No cyanosis SKIN:  Clear with no obvious rashes or skin changes.  NEURO:  Nonfocal. Well oriented.  Appropriate affect.  Pelvic: Exam chaperoned by Nursing EGBUS: no lesions, Vagina: no lesions, atrophic. Cervix: Uterus: ovaries: surgically absent. Bimanual: no masses. Rectovaginal: deferred.   Lab Review Labs on site today: Lab Results  Component Value Date   WBC 4.5 05/05/2022   HGB 10.1 (L) 05/05/2022   HCT 29.8 (L) 05/05/2022   MCV 101.0 (H) 05/05/2022   PLT 227  05/05/2022     Chemistry      Component Value Date/Time   NA 130 (L) 05/05/2022 0802   K 4.2 05/05/2022 0802   CL 101 05/05/2022 0802   CO2 21 (L) 05/05/2022 0802   BUN 15 05/05/2022 0802   CREATININE 0.83 05/05/2022 0802   CREATININE 1.03 (H) 05/22/2020 0804      Component Value Date/Time   CALCIUM 8.8 (L) 05/05/2022 0802   ALKPHOS 133 (H) 05/05/2022 0802   AST 29 05/05/2022 0802   AST 17 05/22/2020 0804   ALT 27 05/05/2022 0802   ALT 11 05/22/2020 0804   BILITOT 0.6 05/05/2022 0802   BILITOT 0.3 05/22/2020 0804     09/22/2021  Vitamin B12 low 238 Ferritin and iron normal  TSH 3.007 WNL  Radiologic Imaging: As per HPI     Assessment:  Nishika H Everton is a 80 y.o. female diagnosed with FIGO stage Ia pT1 N0 M0 carcinosarcoma of uterus. She had total laparoscopic hysterectomy with bilateral salpingo-oophorectomy and bilateral pelvic sentinel lymph node injection and mapping with node biopsies on 12/24/2021. Additionally, underwent concurrent laproscopic uterosacral suspension and perineal repair with Dr. Schroeder/uro-gyn for hx of Gehrung pessary/permanent pessary (20 years). Cancer invading less than 50%, no   LVSI, negative SLNs.  Pelvic washings were positive for malignancy and adjuvant chemotherapy recommended.    She completed 6 cycles of adjuvant carboplatin/taxol chemotherapy 7/23 and tolerated well.  Will be starting vaginal brachytherapy with Dr Baruch Gouty.  Neuropathy has remained stable in the last couple of cycles after lowering the dose of chemotherapy.  Has ongoing fatigue.  She is on gabapentin twice a day for her neuropathy.   Endometrial biopsy. MSS, TP53 WT, ER/PR+.   Hysterectomy.  p53: Wild-type expression (non-mutated), HER2 IHC (Hercept Test): Negative (1+)   Pelvic organ prolapse, managed with Gehrung/permanent pessary- now s/p removal and repair.   Vitamin B12 deficiency, s/p supplementation pre-op.   Mild bronchiectasis, possible Atypical infection  (including MAI) and tiny ground-glass opacities in the left lower lobe, nonspecific.   Abnormal EKG  Medical co-morbidities complicating care: Frailty Plan:   Problem List Items Addressed This Visit       Genitourinary   Endometrial cancer ([place]) - Primary   No evidence of disease presently.  Will complete vaginal brachytherapy the next few weeks.  RTC with Dr Janese Banks in 3 months and to see [place] in 6 months, or sooner if concerning symptoms.   Defer additional b12 supplementation to Dr. Rao/medical oncology.   The patient's diagnosis, an outline of the further diagnostic and laboratory studies which will be required, the recommendation for surgery, and alternatives were discussed with her and her accompanying family members.  All questions were answered to their satisfaction.   Mellody Drown, MD   CC:  Dr. Rubie Maid

## 2022-05-18 ENCOUNTER — Other Ambulatory Visit: Payer: Self-pay

## 2022-05-18 ENCOUNTER — Other Ambulatory Visit: Payer: Self-pay | Admitting: *Deleted

## 2022-05-18 MED ORDER — GABAPENTIN 300 MG PO CAPS: 300.0000 mg | ORAL_CAPSULE | Freq: Two times a day (BID) | ORAL | 2 refills | Status: DC

## 2022-05-19 ENCOUNTER — Ambulatory Visit
Admission: RE | Admit: 2022-05-19 | Discharge: 2022-05-19 | Disposition: A | Discharge status: Home / Self Care | Payer: PPO | Source: Ambulatory Visit | Attending: Radiation Oncology | Admitting: Radiation Oncology

## 2022-05-19 DIAGNOSIS — C541 Malignant neoplasm of endometrium: Secondary | ICD-10-CM | POA: Diagnosis not present

## 2022-05-25 ENCOUNTER — Other Ambulatory Visit: Payer: Self-pay | Admitting: *Deleted

## 2022-05-25 DIAGNOSIS — C541 Malignant neoplasm of endometrium: Secondary | ICD-10-CM | POA: Diagnosis not present

## 2022-05-26 ENCOUNTER — Ambulatory Visit
Admission: RE | Admit: 2022-05-26 | Discharge: 2022-05-26 | Disposition: A | Discharge status: Home / Self Care | Payer: PPO | Source: Ambulatory Visit | Attending: Radiation Oncology | Admitting: Radiation Oncology

## 2022-05-26 ENCOUNTER — Telehealth (INDEPENDENT_AMBULATORY_CARE_PROVIDER_SITE_OTHER): Payer: Self-pay

## 2022-05-26 ENCOUNTER — Other Ambulatory Visit: Payer: Self-pay

## 2022-05-26 DIAGNOSIS — Z803 Family history of malignant neoplasm of breast: Secondary | ICD-10-CM | POA: Insufficient documentation

## 2022-05-26 DIAGNOSIS — N6011 Diffuse cystic mastopathy of right breast: Secondary | ICD-10-CM | POA: Diagnosis not present

## 2022-05-26 DIAGNOSIS — Z800 Family history of malignant neoplasm of digestive organs: Secondary | ICD-10-CM | POA: Insufficient documentation

## 2022-05-26 DIAGNOSIS — C541 Malignant neoplasm of endometrium: Secondary | ICD-10-CM | POA: Insufficient documentation

## 2022-05-26 DIAGNOSIS — Z90710 Acquired absence of both cervix and uterus: Secondary | ICD-10-CM | POA: Diagnosis not present

## 2022-05-26 DIAGNOSIS — Z79899 Other long term (current) drug therapy: Secondary | ICD-10-CM | POA: Insufficient documentation

## 2022-05-26 DIAGNOSIS — Z90722 Acquired absence of ovaries, bilateral: Secondary | ICD-10-CM | POA: Diagnosis not present

## 2022-05-26 DIAGNOSIS — Z791 Long term (current) use of non-steroidal anti-inflammatories (NSAID): Secondary | ICD-10-CM | POA: Diagnosis not present

## 2022-05-26 DIAGNOSIS — I7300 Raynaud's syndrome without gangrene: Secondary | ICD-10-CM | POA: Insufficient documentation

## 2022-05-26 DIAGNOSIS — Z7952 Long term (current) use of systemic steroids: Secondary | ICD-10-CM | POA: Diagnosis not present

## 2022-05-26 DIAGNOSIS — Z85828 Personal history of other malignant neoplasm of skin: Secondary | ICD-10-CM | POA: Diagnosis not present

## 2022-05-26 DIAGNOSIS — E785 Hyperlipidemia, unspecified: Secondary | ICD-10-CM | POA: Diagnosis not present

## 2022-05-26 DIAGNOSIS — K219 Gastro-esophageal reflux disease without esophagitis: Secondary | ICD-10-CM | POA: Insufficient documentation

## 2022-05-26 DIAGNOSIS — Z510 Encounter for antineoplastic radiation therapy: Secondary | ICD-10-CM | POA: Diagnosis not present

## 2022-05-26 DIAGNOSIS — R197 Diarrhea, unspecified: Secondary | ICD-10-CM | POA: Diagnosis not present

## 2022-05-26 LAB — RAD ONC ARIA SESSION SUMMARY
Course Elapsed Days: 0
Plan Fractions Treated to Date: 1
Plan Prescribed Dose Per Fraction: 4.7 Gy
Plan Total Fractions Prescribed: 5
Plan Total Prescribed Dose: 23.5 Gy
Reference Point Dosage Given to Date: 4.5636 Gy
Reference Point Dosage Given to Date: 4.7 Gy
Reference Point Dosage Given to Date: 5.9316 Gy
Reference Point Session Dosage Given: 4.5636 Gy
Reference Point Session Dosage Given: 4.7 Gy
Reference Point Session Dosage Given: 5.9316 Gy
Session Number: 1

## 2022-05-26 NOTE — Telephone Encounter (Signed)
I attempted to contact the patient to schedule a port removal. A message was left for a return call.

## 2022-05-28 ENCOUNTER — Ambulatory Visit
Admission: RE | Admit: 2022-05-28 | Discharge: 2022-05-28 | Disposition: A | Discharge status: Home / Self Care | Payer: PPO | Source: Ambulatory Visit | Attending: Radiation Oncology | Admitting: Radiation Oncology

## 2022-05-28 ENCOUNTER — Other Ambulatory Visit: Payer: Self-pay

## 2022-05-28 DIAGNOSIS — Z510 Encounter for antineoplastic radiation therapy: Secondary | ICD-10-CM | POA: Diagnosis not present

## 2022-05-28 DIAGNOSIS — C541 Malignant neoplasm of endometrium: Secondary | ICD-10-CM | POA: Diagnosis not present

## 2022-05-28 LAB — RAD ONC ARIA SESSION SUMMARY
Course Elapsed Days: 2
Plan Fractions Treated to Date: 2
Plan Prescribed Dose Per Fraction: 4.7 Gy
Plan Total Fractions Prescribed: 5
Plan Total Prescribed Dose: 23.5 Gy
Reference Point Dosage Given to Date: 11.8633 Gy
Reference Point Dosage Given to Date: 9.1273 Gy
Reference Point Dosage Given to Date: 9.4 Gy
Reference Point Session Dosage Given: 4.5636 Gy
Reference Point Session Dosage Given: 4.7 Gy
Reference Point Session Dosage Given: 5.9316 Gy
Session Number: 2

## 2022-05-29 ENCOUNTER — Ambulatory Visit: Payer: PPO

## 2022-06-02 ENCOUNTER — Ambulatory Visit
Admission: RE | Admit: 2022-06-02 | Discharge: 2022-06-02 | Disposition: A | Discharge status: Home / Self Care | Payer: PPO | Source: Ambulatory Visit | Attending: Radiation Oncology | Admitting: Radiation Oncology

## 2022-06-02 ENCOUNTER — Other Ambulatory Visit: Payer: Self-pay

## 2022-06-02 DIAGNOSIS — C541 Malignant neoplasm of endometrium: Secondary | ICD-10-CM | POA: Diagnosis not present

## 2022-06-02 DIAGNOSIS — Z510 Encounter for antineoplastic radiation therapy: Secondary | ICD-10-CM | POA: Diagnosis not present

## 2022-06-02 LAB — RAD ONC ARIA SESSION SUMMARY
Course Elapsed Days: 7
Plan Fractions Treated to Date: 3
Plan Prescribed Dose Per Fraction: 4.7 Gy
Plan Total Fractions Prescribed: 5
Plan Total Prescribed Dose: 23.5 Gy
Reference Point Dosage Given to Date: 13.6909 Gy
Reference Point Dosage Given to Date: 14.1 Gy
Reference Point Dosage Given to Date: 17.7949 Gy
Reference Point Session Dosage Given: 4.5636 Gy
Reference Point Session Dosage Given: 4.7 Gy
Reference Point Session Dosage Given: 5.9316 Gy
Session Number: 3

## 2022-06-02 NOTE — Telephone Encounter (Signed)
I spoke with the patient to schedule a port removal with Dr. Lucky Cowboy. The patient stated she would have to call me back as her son will be the person taking her to the appt.

## 2022-06-03 ENCOUNTER — Telehealth (INDEPENDENT_AMBULATORY_CARE_PROVIDER_SITE_OTHER): Payer: Self-pay

## 2022-06-03 NOTE — Telephone Encounter (Unsigned)
I returned the call to the patient to schedule her for a port removal with Dr. Lucky Cowboy. Patient is scheduled on 06/15/22 with a 8:00 am arrival time to the [facility]. Pre-procedure instructions were discussed and will be mailed.

## 2022-06-04 ENCOUNTER — Other Ambulatory Visit: Payer: Self-pay

## 2022-06-04 ENCOUNTER — Ambulatory Visit
Admission: RE | Admit: 2022-06-04 | Discharge: 2022-06-04 | Disposition: A | Discharge status: Home / Self Care | Payer: PPO | Source: Ambulatory Visit | Attending: Radiation Oncology | Admitting: Radiation Oncology

## 2022-06-04 DIAGNOSIS — Z510 Encounter for antineoplastic radiation therapy: Secondary | ICD-10-CM | POA: Diagnosis not present

## 2022-06-04 DIAGNOSIS — C541 Malignant neoplasm of endometrium: Secondary | ICD-10-CM | POA: Diagnosis not present

## 2022-06-04 LAB — RAD ONC ARIA SESSION SUMMARY
Course Elapsed Days: 9
Plan Fractions Treated to Date: 4
Plan Prescribed Dose Per Fraction: 4.7 Gy
Plan Total Fractions Prescribed: 5
Plan Total Prescribed Dose: 23.5 Gy
Reference Point Dosage Given to Date: 18.2546 Gy
Reference Point Dosage Given to Date: 18.8 Gy
Reference Point Dosage Given to Date: 23.7265 Gy
Reference Point Session Dosage Given: 4.5636 Gy
Reference Point Session Dosage Given: 4.7 Gy
Reference Point Session Dosage Given: 5.9316 Gy
Session Number: 4

## 2022-06-08 ENCOUNTER — Ambulatory Visit
Admission: RE | Admit: 2022-06-08 | Discharge: 2022-06-08 | Disposition: A | Discharge status: Home / Self Care | Payer: PPO | Source: Ambulatory Visit | Attending: Radiation Oncology | Admitting: Radiation Oncology

## 2022-06-08 ENCOUNTER — Other Ambulatory Visit: Payer: Self-pay

## 2022-06-08 DIAGNOSIS — Z510 Encounter for antineoplastic radiation therapy: Secondary | ICD-10-CM | POA: Diagnosis not present

## 2022-06-08 DIAGNOSIS — C541 Malignant neoplasm of endometrium: Secondary | ICD-10-CM | POA: Diagnosis not present

## 2022-06-08 LAB — RAD ONC ARIA SESSION SUMMARY
Course Elapsed Days: 13
Plan Fractions Treated to Date: 5
Plan Prescribed Dose Per Fraction: 4.7 Gy
Plan Total Fractions Prescribed: 5
Plan Total Prescribed Dose: 23.5 Gy
Reference Point Dosage Given to Date: 22.8182 Gy
Reference Point Dosage Given to Date: 23.5 Gy
Reference Point Dosage Given to Date: 29.6582 Gy
Reference Point Session Dosage Given: 4.5636 Gy
Reference Point Session Dosage Given: 4.7 Gy
Reference Point Session Dosage Given: 5.9316 Gy
Session Number: 5

## 2022-06-11 ENCOUNTER — Encounter: Payer: Self-pay | Admitting: *Deleted

## 2022-06-15 ENCOUNTER — Encounter: Payer: Self-pay | Admitting: Vascular Surgery

## 2022-06-15 ENCOUNTER — Other Ambulatory Visit: Payer: Self-pay

## 2022-06-15 ENCOUNTER — Ambulatory Visit
Admission: RE | Admit: 2022-06-15 | Discharge: 2022-06-15 | Disposition: A | Discharge status: Home / Self Care | Payer: PPO | Attending: Vascular Surgery | Admitting: Vascular Surgery

## 2022-06-15 ENCOUNTER — Encounter
Admission: RE | Disposition: A | Discharge status: Home / Self Care | Payer: Self-pay | Source: Home / Self Care | Attending: Vascular Surgery

## 2022-06-15 DIAGNOSIS — C541 Malignant neoplasm of endometrium: Secondary | ICD-10-CM | POA: Insufficient documentation

## 2022-06-15 DIAGNOSIS — Z9221 Personal history of antineoplastic chemotherapy: Secondary | ICD-10-CM

## 2022-06-15 DIAGNOSIS — Z452 Encounter for adjustment and management of vascular access device: Secondary | ICD-10-CM | POA: Insufficient documentation

## 2022-06-15 HISTORY — PX: PORTA CATH REMOVAL: CATH118286

## 2022-06-15 SURGERY — PORTA CATH REMOVAL
Anesthesia: Moderate Sedation

## 2022-06-15 MED ORDER — FAMOTIDINE 20 MG PO TABS: 40.0000 mg | ORAL_TABLET | Freq: Once | ORAL | Status: DC | PRN

## 2022-06-15 MED ORDER — CEFAZOLIN SODIUM-DEXTROSE 2-4 GM/100ML-% IV SOLN
INTRAVENOUS | Status: AC
  Administered 2022-06-15: 2 g via INTRAVENOUS
  Filled 2022-06-15: qty 100

## 2022-06-15 MED ORDER — MIDAZOLAM HCL 2 MG/2ML IJ SOLN
INTRAMUSCULAR | Status: AC
  Filled 2022-06-15: qty 4

## 2022-06-15 MED ORDER — FENTANYL CITRATE PF 50 MCG/ML IJ SOSY
PREFILLED_SYRINGE | INTRAMUSCULAR | Status: AC
  Administered 2022-06-15: 50 ug
  Filled 2022-06-15: qty 2

## 2022-06-15 MED ORDER — DIPHENHYDRAMINE HCL 50 MG/ML IJ SOLN: 50.0000 mg | Freq: Once | INTRAMUSCULAR | Status: DC | PRN

## 2022-06-15 MED ORDER — MIDAZOLAM HCL 2 MG/ML PO SYRP: 8.0000 mg | ORAL_SOLUTION | Freq: Once | ORAL | Status: DC | PRN

## 2022-06-15 MED ORDER — MIDAZOLAM HCL 2 MG/2ML IJ SOLN
INTRAMUSCULAR | Status: DC | PRN
  Administered 2022-06-15 (×2): 1 mg via INTRAVENOUS

## 2022-06-15 MED ORDER — HYDROMORPHONE HCL 1 MG/ML IJ SOLN: 1.0000 mg | Freq: Once | INTRAMUSCULAR | Status: DC | PRN

## 2022-06-15 MED ORDER — CEFAZOLIN SODIUM-DEXTROSE 2-4 GM/100ML-% IV SOLN: 2.0000 g | INTRAVENOUS | Status: AC

## 2022-06-15 MED ORDER — SODIUM CHLORIDE 0.9 % IV SOLN: INTRAVENOUS | Status: DC

## 2022-06-15 MED ORDER — METHYLPREDNISOLONE SODIUM SUCC 125 MG IJ SOLR: 125.0000 mg | Freq: Once | INTRAMUSCULAR | Status: DC | PRN

## 2022-06-15 MED ORDER — ONDANSETRON HCL 4 MG/2ML IJ SOLN
4.0000 mg | Freq: Four times a day (QID) | INTRAMUSCULAR | Status: DC | PRN
Start: 2022-06-15 — End: 2022-06-15

## 2022-06-15 SURGICAL SUPPLY — 9 items
ADH SKN CLS APL DERMABOND .7 (GAUZE/BANDAGES/DRESSINGS) ×1
COVER SURGICAL LIGHT HANDLE (MISCELLANEOUS) IMPLANT
DERMABOND ADVANCED (GAUZE/BANDAGES/DRESSINGS) ×1
DERMABOND ADVANCED .7 DNX12 (GAUZE/BANDAGES/DRESSINGS) IMPLANT
PENCIL ELECTRO HAND CTR (MISCELLANEOUS) IMPLANT
SPONGE XRAY 4X4 16PLY STRL (MISCELLANEOUS) IMPLANT
SUT MNCRL AB 4-0 PS2 18 (SUTURE) IMPLANT
SUT VIC AB 3-0 SH 27 (SUTURE) ×1
SUT VIC AB 3-0 SH 27X BRD (SUTURE) IMPLANT

## 2022-06-15 NOTE — Op Note (Signed)
[facility]       Operative Note  Date: 06/15/2022  Preoperative diagnosis:  1. Carcinosarcoma, no longer using port  Postoperative diagnosis:  Same as above  Procedures: #1. Removal of left jugular port a cath   Surgeon: Leotis Pain, MD  Anesthesia: Local with moderate conscious sedation for 17 minutes using 2 mg of Versed and 50 mcg of Fentanyl  Fluoroscopy time: none  Contrast used: 0  Estimated blood loss: Minimal  Indication for the procedure:  The patient is a 81 y.o. female who has done her chemotherapy and no longer needs their Port-A-Cath. The patient desires to have this removed. Risks and benefits including need for potential replacement with recurrent disease were discussed and patient is agreeable to proceed.  Description of procedure: The patient was brought to the vascular and interventional radiology suite. Moderate conscious sedation was administered during a face to face encounter with the patient throughout the procedure with my supervision of the RN administering medicines and monitoring the patient's vital signs, pulse oximetry, telemetry and mental status throughout from the start of the procedure until the patient was taken to the recovery room.  The left neck chest and shoulder were sterilely prepped and draped, and a sterile surgical field was created. The area was then anesthetized with 1% lidocaine copiously. The previous incision was reopened and electrocautery used to dissected down to the port and the catheter. These were dissected free and the catheter was gently removed from the vein in its entirety. The port was dissected out from the fibrous connective tissue and the Prolene sutures were removed. The port was then removed in its entirety including the catheter. The wound was then closed with a 3-0 Vicryl and a 4-0 Monocryl and Dermabond was placed as a dressing. The patient was then taken to the  recovery room in stable condition having tolerated the procedure well.  Complications: none  Condition: stable   Leotis Pain, MD 06/15/2022 10:28 AM   This note was created with Dragon Medical transcription system. Any errors in dictation are purely unintentional.

## 2022-06-15 NOTE — H&P (Signed)
[facility] Admission History & Physical  MRN : [id number]  Elizabeth Friedman is a 81 y.o. ([birth year]) female who presents with chief complaint of No chief complaint on file. Marland Kitchen  History of Present Illness: Patient presents today for removal of her Port-A-Cath.  She has carcinosarcoma this was placed earlier this year.  She no longer needs the port.  Current Facility-Administered Medications  Medication Dose Route Frequency Provider Last Rate Last Admin   0.9 %  sodium chloride infusion   Intravenous Continuous Kris Hartmann, NP       ceFAZolin (ANCEF) IVPB 2g/100 mL premix  2 g Intravenous 30 min Pre-Op Kris Hartmann, NP       diphenhydrAMINE (BENADRYL) injection 50 mg  50 mg Intravenous Once PRN Kris Hartmann, NP       famotidine (PEPCID) tablet 40 mg  40 mg Oral Once PRN Kris Hartmann, NP       HYDROmorphone (DILAUDID) injection 1 mg  1 mg Intravenous Once PRN Kris Hartmann, NP       methylPREDNISolone sodium succinate (SOLU-MEDROL) 125 mg/2 mL injection 125 mg  125 mg Intravenous Once PRN Kris Hartmann, NP       midazolam (VERSED) 2 MG/ML syrup 8 mg  8 mg Oral Once PRN Kris Hartmann, NP       ondansetron [facility]) injection 4 mg  4 mg Intravenous Q6H PRN Kris Hartmann, NP        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[place])    Ductal carcinoma in situ (DCIS) of right breast    Fibrocystic breast disease    GERD (gastroesophageal reflux disease)    Hearing aid worn    bilateral   History of hiatal hernia    Hyperlipidemia    Lung nodule    Menopause    Migraine    Migraine    optical migraines   Motion sickness    Plantar fasciitis    Prolapse of female pelvic organs    hodge pessary   Raynaud's disease    Sciatic leg pain    Sciatica    Skin cancer of face    Urinary retention with incomplete bladder  emptying    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   Vitreous detachment of both eyes     Past Surgical History:  Procedure Laterality Date   APPENDECTOMY  1951   BREAST LUMPECTOMY WITH RADIOACTIVE SEED LOCALIZATION Right 05/29/2020   Procedure: RIGHT BREAST LUMPECTOMY WITH RADIOACTIVE SEED LOCALIZATION;  Surgeon: Rolm Bookbinder, MD;  Location: [place];  Service: General;  Laterality: Right;   CARDIAC CATHETERIZATION  2004   negative   CATARACT EXTRACTION W/PHACO Right 02/26/2021   Procedure: CATARACT EXTRACTION PHACO AND INTRAOCULAR LENS PLACEMENT ([facility]) RIGHT 5.86 01:19.6 7.4%;  Surgeon: Leandrew Koyanagi, MD;  Location: [place];  Service: Ophthalmology;  Laterality: Right;   CATARACT EXTRACTION W/PHACO Left 09/03/2021   Procedure: CATARACT EXTRACTION PHACO AND INTRAOCULAR LENS PLACEMENT ([place]) LEFT;  Surgeon: Leandrew Koyanagi, MD;  Location: [place];  Service: Ophthalmology;  Laterality: Left;  9.61 01:15.9   COLONOSCOPY     2000, 2006, 2012   COLONOSCOPY WITH PROPOFOL N/A 03/27/2016   Procedure: COLONOSCOPY WITH PROPOFOL;  Surgeon: Manya Silvas, MD;  Location: [facility];  Service: Endoscopy;  Laterality: N/A;   COLONOSCOPY WITH PROPOFOL N/A 08/14/2020   Procedure: COLONOSCOPY WITH PROPOFOL;  Surgeon: Toledo, Benay Pike, MD;  Location: [facility];  Service: Gastroenterology;  Laterality: N/A;   CYSTOSCOPY N/A 12/24/2021   Procedure: CYSTOSCOPY;  Surgeon: Jaquita Folds, MD;  Location: [facility];  Service: Gynecology;  Laterality: N/A;   DILATATION & CURETTAGE/HYSTEROSCOPY WITH MYOSURE N/A 12/01/2021   Procedure: DILATATION & CURETTAGE/HYSTEROSCOPY WITH MYOSURE;  Surgeon: Rubie Maid, MD;  Location: [facility];  Service: Gynecology;  Laterality: N/A;   ESOPHAGOGASTRODUODENOSCOPY  08/1999   ESOPHAGOGASTRODUODENOSCOPY (EGD) WITH PROPOFOL N/A 08/14/2020   Procedure: ESOPHAGOGASTRODUODENOSCOPY (EGD) WITH PROPOFOL;  Surgeon:  Toledo, Benay Pike, MD;  Location: [facility];  Service: Gastroenterology;  Laterality: N/A;   ESOPHAGOGASTRODUODENOSCOPY (EGD) WITH PROPOFOL N/A 03/21/2021   Procedure: ESOPHAGOGASTRODUODENOSCOPY (EGD) WITH PROPOFOL;  Surgeon: Toledo, Benay Pike, MD;  Location: [facility];  Service: Gastroenterology;  Laterality: N/A;   PORTA CATH INSERTION N/A 01/15/2022   Procedure: PORTA CATH INSERTION;  Surgeon: Algernon Huxley, MD;  Location: [facility];  Service: Cardiovascular;  Laterality: N/A;   SKIN CANCER EXCISION     face and neck   TOTAL LAPAROSCOPIC HYSTERECTOMY WITH BILATERAL SALPINGO OOPHORECTOMY Bilateral 12/24/2021   Procedure: TOTAL LAPAROSCOPIC HYSTERECTOMY WITH BILATERAL SALPINGO OOPHORECTOMY, PELVIC NODE BIOPSY,  PESSARY REMOVAL (PESSARY REMOVAL BY DR. Wannetta Sender;  Surgeon: Mellody Drown, MD;  Location: [facility];  Service: Gynecology;  Laterality: Bilateral;     Social History   Tobacco Use   Smoking status: Never   Smokeless tobacco: Never  Vaping Use   Vaping Use: Never used  Substance Use Topics   Alcohol use: No   Drug use: No     Family History  Problem Relation Age of Onset   Breast cancer Mother    Heart disease Father    Colon cancer Brother    Diabetes Neg Hx    Ovarian cancer Neg Hx     No Known Allergies   REVIEW OF SYSTEMS (Negative unless checked)  Constitutional: '[x]'$ Weight loss  '[]'$ Fever  '[]'$ Chills Cardiac: '[]'$ Chest pain   '[]'$ Chest pressure   '[]'$ Palpitations   '[]'$ Shortness of breath when laying flat   '[]'$ Shortness of breath at rest   '[x]'$ Shortness of breath with exertion. Vascular:  '[]'$ Pain in legs with walking   '[]'$ Pain in legs at rest   '[]'$ Pain in legs when laying flat   '[]'$ Claudication   '[]'$ Pain in feet when walking  '[]'$ Pain in feet at rest  '[]'$ Pain in feet when laying flat   '[]'$ History of DVT   '[]'$ Phlebitis   '[]'$ Swelling in legs   '[]'$ Varicose veins   '[]'$ Non-healing ulcers Pulmonary:   '[]'$ Uses home oxygen   '[]'$ Productive cough   '[]'$ Hemoptysis   '[]'$ Wheeze  '[]'$ COPD    '[]'$ Asthma Neurologic:  '[]'$ Dizziness  '[]'$ Blackouts   '[]'$ Seizures   '[]'$ History of stroke   '[]'$ History of TIA  '[]'$ Aphasia   '[]'$ Temporary blindness   '[]'$ Dysphagia   '[]'$ Weakness or numbness in arms   '[]'$ Weakness or numbness in legs Musculoskeletal:  '[x]'$ Arthritis   '[]'$ Joint swelling   '[x]'$ Joint pain   '[]'$ Low back pain Hematologic:  '[]'$ Easy bruising  '[]'$ Easy bleeding   '[]'$ Hypercoagulable state   '[]'$ Anemic  '[]'$ Hepatitis Gastrointestinal:  '[]'$ Blood in stool   '[]'$ Vomiting blood  '[x]'$ Gastroesophageal reflux/heartburn   '[]'$ Difficulty swallowing. Genitourinary:  '[]'$ Chronic kidney disease   '[]'$ Difficult urination  '[]'$ Frequent urination  '[]'$ Burning with urination   '[]'$ Blood in urine Skin:  '[]'$ Rashes   '[]'$ Ulcers   '[]'$ Wounds  Psychological:  '[x]'$ History of anxiety   '[]'$  History of major depression.  Physical Examination  There were no vitals filed for this visit. There is no height or weight on file to calculate BMI. Gen: WD/WN, NAD Head: [place]/AT, No temporalis wasting.  Ear/Nose/Throat: Hearing grossly intact, nares w/o erythema or drainage, oropharynx w/o Erythema/Exudate,  Eyes: Conjunctiva clear, sclera non-icteric Neck: Trachea midline.  No JVD.  Pulmonary:  Good air movement, respirations not labored, no use of accessory muscles.  Cardiac: RRR, normal S1, S2. Vascular:  Vessel Right Left  Radial Palpable Palpable                   Musculoskeletal: M/S 5/5 throughout.  Extremities without ischemic changes.  No deformity or atrophy.  Neurologic: Sensation grossly intact in extremities.  Symmetrical.  Speech is fluent. Motor exam as listed above. Psychiatric: Judgment intact, Mood & affect appropriate for pt's clinical situation. Dermatologic: No rashes or ulcers noted.  No cellulitis or open wounds.      CBC Lab Results  Component Value Date   WBC 4.5 05/05/2022   HGB 10.1 (L) 05/05/2022   HCT 29.8 (L) 05/05/2022   MCV 101.0 (H) 05/05/2022   PLT 227 05/05/2022    BMET    Component Value Date/Time   NA 130 (L)  05/05/2022 0802   K 4.2 05/05/2022 0802   CL 101 05/05/2022 0802   CO2 21 (L) 05/05/2022 0802   GLUCOSE 102 (H) 05/05/2022 0802   BUN 15 05/05/2022 0802   CREATININE 0.83 05/05/2022 0802   CREATININE 1.03 (H) 05/22/2020 0804   CALCIUM 8.8 (L) 05/05/2022 0802   GFRNONAA >60 05/05/2022 0802   GFRNONAA 52 (L) 05/22/2020 0804   GFRAA 60 (L) 05/22/2020 0804   CrCl cannot be calculated (Patient's most recent lab result is older than the maximum 21 days allowed.).  COAG No results found for: "INR", "PROTIME"  Radiology No results found.   Assessment/Plan 1. Carcinosarcoma. No longer needs port.  Will remove today.    Leotis Pain, MD  06/15/2022 8:19 AM

## 2022-06-15 NOTE — Progress Notes (Signed)
Pt eating and drinking, family at bedside

## 2022-07-09 DIAGNOSIS — J479 Bronchiectasis, uncomplicated: Secondary | ICD-10-CM | POA: Diagnosis not present

## 2022-07-14 ENCOUNTER — Encounter: Payer: Self-pay | Admitting: Nurse Practitioner

## 2022-07-23 ENCOUNTER — Ambulatory Visit
Admission: RE | Admit: 2022-07-23 | Discharge: 2022-07-23 | Disposition: A | Discharge status: Home / Self Care | Payer: PPO | Source: Ambulatory Visit | Attending: Radiation Oncology | Admitting: Radiation Oncology

## 2022-07-23 VITALS — BP 137/67 | HR 76 | Temp 98.0°F | Resp 16 | Ht 62.0 in | Wt 101.6 lb

## 2022-07-23 DIAGNOSIS — C541 Malignant neoplasm of endometrium: Secondary | ICD-10-CM | POA: Insufficient documentation

## 2022-07-23 NOTE — Progress Notes (Signed)
Radiation Oncology Follow up Note  Name: Elizabeth Friedman   Date:   07/23/2022 MRN:  [id number] DOB: [birth year]    This 81 y.o. 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  REFERRING PROVIDER: Adin Hector, MD  HPI: Patient is an 81 year old female now out 1 month having completed vaginal HDR in addition to CarboTaxol chemotherapy for stage Ia carcinosarcoma of the uterus.  Seen today in routine follow-up she is doing fairly well.  She takes mid milk of magnesia every day does occasionally have some loose bowel movements.  I have asked her why she does not discontinue that and she says she will get too constipated.  She is having no increased lower urinary tract symptoms.  She does have some pain in the pelvic area but although this precedes her treatments going back to actually preceding her surgery.  She has no vaginal discharge or bleeding..  She has had her port removed.  COMPLICATIONS OF TREATMENT: none  FOLLOW UP COMPLIANCE: keeps appointments   PHYSICAL EXAM:  BP 137/67   Pulse 76   Temp 98 F (36.7 C)   Resp 16   Ht '5\' 2"'$  (1.575 m)   Wt 101 lb 9.6 oz (46.1 kg)   BMI 18.58 kg/m  Well-developed well-nourished patient in NAD. HEENT reveals PERLA, EOMI, discs not visualized.  Oral cavity is clear. No oral mucosal lesions are identified. Neck is clear without evidence of cervical or supraclavicular adenopathy. Lungs are clear to A&P. Cardiac examination is essentially unremarkable with regular rate and rhythm without murmur rub or thrill. Abdomen is benign with no organomegaly or masses noted. Motor sensory and DTR levels are equal and symmetric in the upper and lower extremities. Cranial nerves II through XII are grossly intact. Proprioception is intact. No peripheral adenopathy or edema is identified. No motor or sensory levels are noted. Crude  visual fields are within normal range.  RADIOLOGY RESULTS: No current films for review  PLAN: Present time patient is doing well very low side effect profile status post chemotherapy as well as vaginal brachytherapy.  And pleased with her overall progress and assess throughout in 4 to 5 months for follow-up.  Sure has follow-up appointments with GYN oncology.  Patient is to call with any concerns.  I would like to take this opportunity to thank you for allowing me to participate in the care of your patient.Noreene Filbert, MD

## 2022-07-27 NOTE — Progress Notes (Signed)
ANNUAL PREVENTATIVE CARE GYNECOLOGY  ENCOUNTER NOTE  Subjective:       Elizabeth Friedman is a 81 y.o. G71P2002 female here for a routine annual gynecologic exam and cancer surveillance.  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  She is also s/p CarboTaxol chemotherapy. Currenlty undergoing radiation.  She has The patient is not sexually active. The patient is not taking hormone replacement therapy. Patient denies post-menopausal vaginal bleeding. The patient wears seatbelts: yes. The patient participates in regular exercise: yes. Has the patient ever been transfused or tattooed?: no. The patient reports that there is not domestic violence in her life.  Current complaints: 1.  Complains of bowel issues since her hysterectomy surgery and pessary removal in February (performed for endometrial cancer).  Notes that  she now takes Milk of Magnesia daily to keep stools regular. Also takes 2 stool softeners. Also has been noting some gas issues (however had problems prior to her surgery as well). Lastly complaining of burning sensation in her abdomen, currently on PPI (Protonix for several years).     Gynecologic History No LMP recorded. Patient is postmenopausal. Contraception: post menopausal status Last Pap: No further paps needed. Denies h/o abnormal pap smears Last mammogram: 02/19/2022. Results were: normal Last Colonoscopy:  03/27/2016. Results were: Internal hemorrhoids. Repeat in 5 years:  Last Dexa Scan: 08/15/2019   Obstetric History OB History  Gravida Para Term Preterm AB Living  '2 2 2     2  '$ SAB IAB Ectopic Multiple Live Births          2    # Outcome Date GA Lbr Len/2nd Weight Sex Delivery Anes PTL Lv  2 Term 1964    F Vag-Spont   LIV  1 Term 1962    F Vag-Spont   LIV    Past Medical History:  Diagnosis Date   Allergic rhinitis    Anemia     Anginal pain (HCC)    Arthritis    Atypical chest pain    Breast cancer ([place])    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[place])    Ductal carcinoma in situ (DCIS) of right breast    Fibrocystic breast disease    GERD (gastroesophageal reflux disease)    Hearing aid worn    bilateral   History of hiatal hernia    Hyperlipidemia    Lung nodule    Menopause    Migraine    Migraine    optical migraines   Motion sickness    Plantar fasciitis    Prolapse of female pelvic organs    hodge pessary   Raynaud's disease    Sciatic leg pain    Sciatica    Skin cancer of face    Urinary retention with incomplete bladder emptying    Uterine carcinosarcoma ([place]) 11/2021   Vitreous detachment of both eyes     Family History  Problem Relation Age of Onset   Breast cancer Mother    Heart disease Father    Colon cancer Brother    Diabetes Neg Hx    Ovarian cancer Neg Hx     Past Surgical History:  Procedure Laterality Date   APPENDECTOMY  1951   BREAST LUMPECTOMY WITH RADIOACTIVE SEED LOCALIZATION Right 05/29/2020   Procedure: RIGHT BREAST LUMPECTOMY WITH RADIOACTIVE SEED LOCALIZATION;  Surgeon: Rolm Bookbinder, MD;  Location: [place];  Service: General;  Laterality: Right;  CARDIAC CATHETERIZATION  2004   negative   CATARACT EXTRACTION W/PHACO Right 02/26/2021   Procedure: CATARACT EXTRACTION PHACO AND INTRAOCULAR LENS PLACEMENT ([facility]) RIGHT 5.86 01:19.6 7.4%;  Surgeon: Leandrew Koyanagi, MD;  Location: [place];  Service: Ophthalmology;  Laterality: Right;   CATARACT EXTRACTION W/PHACO Left 09/03/2021   Procedure: CATARACT EXTRACTION PHACO AND INTRAOCULAR LENS PLACEMENT ([place]) LEFT;  Surgeon: Leandrew Koyanagi, MD;  Location: [place];  Service: Ophthalmology;  Laterality: Left;  9.61 01:15.9   COLONOSCOPY     2000, 2006, 2012   COLONOSCOPY WITH PROPOFOL N/A 03/27/2016   Procedure: COLONOSCOPY WITH PROPOFOL;  Surgeon: Manya Silvas, MD;  Location[facility]  ENDOSCOPY;  Service: Endoscopy;  Laterality: N/A;   COLONOSCOPY WITH PROPOFOL N/A 08/14/2020   Procedure: COLONOSCOPY WITH PROPOFOL;  Surgeon: Toledo, Benay Pike, MD;  Location: [facility] ENDOSCOPY;  Service: Gastroenterology;  Laterality: N/A;   CYSTOSCOPY N/A 12/24/2021   Procedure: CYSTOSCOPY;  Surgeon: Jaquita Folds, MD;  Location: [facility];  Service: Gynecology;  Laterality: N/A;   DILATATION & CURETTAGE/HYSTEROSCOPY WITH MYOSURE N/A 12/01/2021   Procedure: DILATATION & CURETTAGE/HYSTEROSCOPY WITH MYOSURE;  Surgeon: Rubie Maid, MD;  Location: [facility];  Service: Gynecology;  Laterality: N/A;   ESOPHAGOGASTRODUODENOSCOPY  08/1999   ESOPHAGOGASTRODUODENOSCOPY (EGD) WITH PROPOFOL N/A 08/14/2020   Procedure: ESOPHAGOGASTRODUODENOSCOPY (EGD) WITH PROPOFOL;  Surgeon: Toledo, Benay Pike, MD;  Location: [facility];  Service: Gastroenterology;  Laterality: N/A;   ESOPHAGOGASTRODUODENOSCOPY (EGD) WITH PROPOFOL N/A 03/21/2021   Procedure: ESOPHAGOGASTRODUODENOSCOPY (EGD) WITH PROPOFOL;  Surgeon: Toledo, Benay Pike, MD;  Location: [facility];  Service: Gastroenterology;  Laterality: N/A;   PORTA CATH INSERTION N/A 01/15/2022   Procedure: PORTA CATH INSERTION;  Surgeon: Algernon Huxley, MD;  Location: [place] [facility];  Service: Cardiovascular;  Laterality: N/A;   PORTA CATH REMOVAL N/A 06/15/2022   Procedure: PORTA CATH REMOVAL;  Surgeon: Algernon Huxley, MD;  Location: [place] [facility];  Service: Cardiovascular;  Laterality: N/A;   SKIN CANCER EXCISION     face and neck   TOTAL LAPAROSCOPIC HYSTERECTOMY WITH BILATERAL SALPINGO OOPHORECTOMY Bilateral 12/24/2021   Procedure: TOTAL LAPAROSCOPIC HYSTERECTOMY WITH BILATERAL SALPINGO OOPHORECTOMY, PELVIC NODE BIOPSY,  PESSARY REMOVAL (PESSARY REMOVAL BY DR. Wannetta Sender;  Surgeon: Mellody Drown, MD;  Location: [facility];  Service: Gynecology;  Laterality: Bilateral;    Social History   Socioeconomic History   Marital status: Widowed    Spouse  name: Not on file   Number of children: Not on file   Years of education: Not on file   Highest education level: Not on file  Occupational History   Not on file  Tobacco Use   Smoking status: Never   Smokeless tobacco: Never  Vaping Use   Vaping Use: Never used  Substance and Sexual Activity   Alcohol use: No   Drug use: No   Sexual activity: Not Currently    Birth control/protection: Post-menopausal  Other Topics Concern   Not on file  Social History Narrative   Lives alone   Social Determinants of Health   Financial Resource Strain: Not on file  Food Insecurity: Not on file  Transportation Needs: Not on file  Physical Activity: Sufficiently Active (12/21/2017)   Exercise Vital Sign    Days of Exercise per Week: 3 days    Minutes of Exercise per Session: 60 min  Stress: Not on file  Social Connections: Not on file  Intimate Partner Violence: Not on file    Current Outpatient Medications on  File Prior to Visit  Medication Sig Dispense Refill   acetaminophen (TYLENOL) 500 MG tablet Take 1,000 mg by mouth every 6 (six) hours as needed for moderate pain or mild pain.     alum & mag hydroxide-simeth (MAALOX/MYLANTA) 200-200-20 MG/5ML suspension Take 30 mLs by mouth every 6 (six) hours as needed for indigestion or heartburn.     azithromycin (ZITHROMAX) 250 MG tablet Take 250 mg by mouth daily.     Calcium-Vitamin D (CALTRATE 600 PLUS-VIT D PO) Take 1 tablet by mouth in the morning.     Carboxymethylcellulose Sod PF 1 % GEL Place 1 drop into both eyes at bedtime.     celecoxib (CELEBREX) 200 MG capsule Take 200 mg by mouth 2 (two) times daily.     Cholecalciferol (VITAMIN D) 125 MCG (5000 UT) CAPS Take 5,000 Units by mouth in the morning.     Coenzyme Q10 200 MG TABS Take 200 mg by mouth in the morning.     Cyanocobalamin (VITAMIN B-12 PO) Take 1 tablet by mouth in the morning.     dexamethasone (DECADRON) 4 MG tablet Take 2 tablets by mouth once a day starting the day after  chemotherapy. Continue for 3 days total. Take with food. 30 tablet 1   gabapentin (NEURONTIN) 300 MG capsule Take 1 capsule (300 mg total) by mouth 2 (two) times daily. (Patient taking differently: Take 400 mg by mouth 2 (two) times daily.) 60 capsule 2   Iron-Vitamin C (VITRON-C) 65-125 MG TABS Take 1 tablet by mouth in the morning.     lidocaine-prilocaine (EMLA) cream Apply to affected area once 30 g 3   LORazepam (ATIVAN) 0.5 MG tablet Take 1 tablet (0.5 mg total) by mouth every 6 (six) hours as needed (Nausea or vomiting). 30 tablet 0   Omega-3 Fatty Acids (FISH OIL PO) Take 1,400 mg by mouth in the morning.     omeprazole (PRILOSEC) 40 MG capsule Take 40 mg by mouth daily in the afternoon. (Patient not taking: Reported on 03/02/2022)     ondansetron (ZOFRAN) 8 MG tablet Take 1 tablet (8 mg total) by mouth 2 (two) times daily as needed. Start on the third day after chemotherapy. (Patient not taking: Reported on 02/09/2022) 30 tablet 1   pantoprazole (PROTONIX) 40 MG tablet Take 40 mg by mouth in the morning.     prochlorperazine (COMPAZINE) 10 MG tablet Take 1 tablet (10 mg total) by mouth every 6 (six) hours as needed (Nausea or vomiting). (Patient not taking: Reported on 05/05/2022) 30 tablet 1   RESTASIS 0.05 % ophthalmic emulsion Place 1 drop into both eyes 2 (two) times daily.     simvastatin (ZOCOR) 10 MG tablet Take 10 mg by mouth every evening.     topiramate (TOPAMAX) 50 MG tablet Take 50 mg by mouth every evening.     Triamcinolone Acetonide (NASACORT ALLERGY 24HR NA) Place 1 spray into the nose daily as needed (sinus congestion).     vitamin E 180 MG (400 UNITS) capsule Take 400 Units by mouth in the morning.     No current facility-administered medications on file prior to visit.    No Known Allergies    Review of Systems ROS Review of Systems - General ROS: negative for - chills, fatigue, fever, hot flashes, night sweats, weight gain or weight loss Psychological ROS: negative  for - anxiety, decreased libido, depression, mood swings, physical abuse or sexual abuse Ophthalmic ROS: negative for - blurry vision, eye pain  or loss of vision ENT ROS: negative for - headaches, hearing change, visual changes or vocal changes Allergy and Immunology ROS: negative for - hives, itchy/watery eyes or seasonal allergies Hematological and Lymphatic ROS: negative for - bleeding problems, bruising, swollen lymph nodes or weight loss Endocrine ROS: negative for - galactorrhea, hair pattern changes, hot flashes, malaise/lethargy, mood swings, palpitations, polydipsia/polyuria, skin changes, temperature intolerance or unexpected weight changes Breast ROS: negative for - new or changing breast lumps or nipple discharge Respiratory ROS: negative for - cough or shortness of breath Cardiovascular ROS: negative for - chest pain, irregular heartbeat, palpitations or shortness of breath Gastrointestinal ROS: no abdominal pain, change in bowel habits, or black or bloody stools Genito-Urinary ROS: no dysuria, trouble voiding, or hematuria Musculoskeletal ROS: negative for - joint pain or joint stiffness Neurological ROS: negative for - bowel and bladder control changes Dermatological ROS: negative for rash and skin lesion changes   Objective:   BP (!) 156/84   Pulse 67   Resp 16   Ht '5\' 2"'$  (1.575 m)   Wt 101 lb 9.6 oz (46.1 kg)   BMI 18.58 kg/m  CONSTITUTIONAL: Well-developed, well-nourished female in no acute distress.  PSYCHIATRIC: Normal mood and affect. Normal behavior. Normal judgment and thought content. [place]: Alert and oriented to person, place, and time. Normal muscle tone coordination. No cranial nerve deficit noted. HENT:  Normocephalic, atraumatic, External right and left ear normal. Oropharynx is clear and moist EYES: Conjunctivae and EOM are normal. Pupils are equal, round, and reactive to light. No scleral icterus.  NECK: Normal range of motion, supple, no masses.   Normal thyroid.  SKIN: Skin is warm and dry. No rash noted. Not diaphoretic. No erythema. No pallor. CARDIOVASCULAR: Normal heart rate noted, regular rhythm, no murmur. RESPIRATORY: Clear to auscultation bilaterally. Effort and breath sounds normal, no problems with respiration noted. BREASTS: Symmetric in size. No masses, skin changes, nipple drainage, or lymphadenopathy. ABDOMEN: Soft, normal bowel sounds, no distention noted.  No tenderness, rebound or guarding.  BLADDER: Normal PELVIC:  Bladder no bladder distension noted  Urethra: normal appearing urethra with no masses, tenderness or lesions  Vulva: normal appearing vulva with no masses, tenderness or lesions  Vagina: atrophic, vaginal cuff well-healed.   Cervix: surgically absent  Uterus: surgically absent, vaginal cuff well healed  Adnexa: surgically absent bilateral  RV: External Exam NormaI, No Rectal Masses, and Normal Sphincter tone  MUSCULOSKELETAL: Normal range of motion. No tenderness.  No cyanosis, clubbing, or edema.  2+ distal pulses. LYMPHATIC: No Axillary, Supraclavicular, or Inguinal Adenopathy.   Labs: Lab Results  Component Value Date   WBC 4.5 05/05/2022   HGB 10.1 (L) 05/05/2022   HCT 29.8 (L) 05/05/2022   MCV 101.0 (H) 05/05/2022   PLT 227 05/05/2022    Lab Results  Component Value Date   CREATININE 0.83 05/05/2022   BUN 15 05/05/2022   NA 130 (L) 05/05/2022   K 4.2 05/05/2022   CL 101 05/05/2022   CO2 21 (L) 05/05/2022    Lab Results  Component Value Date   ALT 27 05/05/2022   AST 29 05/05/2022   ALKPHOS 133 (H) 05/05/2022   BILITOT 0.6 05/05/2022    No results found for: "CHOL", "HDL", "LDLCALC", "LDLDIRECT", "TRIG", "CHOLHDL"  No results found for: "TSH"  No results found for: "HGBA1C"   Assessment:   No diagnosis found.   Plan:  Pap: Not needed Mammogram:  UTD Colon Screening:  Labs: None ordered. Up to date with  PCP.  Routine preventative health maintenance measures  emphasized: Exercise/Diet/Weight control, Tobacco Warnings, Alcohol/Substance use risks, Stress Management, Immunizations.  Discussed f/u with GI regarding her issues. Also is due for another colonoscopy.  H/o breast cancer, follows regularly with Oncology.  Endometrial cancer, continue 6 months visits alternating with GYN Onc. Next visit in January.  Return to [place] months   Rubie Maid, MD [facility]

## 2022-07-28 ENCOUNTER — Encounter: Payer: Self-pay | Admitting: Obstetrics and Gynecology

## 2022-07-28 ENCOUNTER — Ambulatory Visit (INDEPENDENT_AMBULATORY_CARE_PROVIDER_SITE_OTHER): Payer: PPO | Admitting: Obstetrics and Gynecology

## 2022-07-28 VITALS — BP 156/84 | HR 67 | Resp 16 | Ht 62.0 in | Wt 101.6 lb

## 2022-07-28 DIAGNOSIS — Z8542 Personal history of malignant neoplasm of other parts of uterus: Secondary | ICD-10-CM | POA: Diagnosis not present

## 2022-07-28 DIAGNOSIS — Z01419 Encounter for gynecological examination (general) (routine) without abnormal findings: Secondary | ICD-10-CM

## 2022-07-28 DIAGNOSIS — Z86000 Personal history of in-situ neoplasm of breast: Secondary | ICD-10-CM | POA: Diagnosis not present

## 2022-07-28 DIAGNOSIS — Z08 Encounter for follow-up examination after completed treatment for malignant neoplasm: Secondary | ICD-10-CM

## 2022-07-31 DIAGNOSIS — M5431 Sciatica, right side: Secondary | ICD-10-CM | POA: Diagnosis not present

## 2022-07-31 DIAGNOSIS — M9903 Segmental and somatic dysfunction of lumbar region: Secondary | ICD-10-CM | POA: Diagnosis not present

## 2022-07-31 DIAGNOSIS — R202 Paresthesia of skin: Secondary | ICD-10-CM | POA: Diagnosis not present

## 2022-07-31 DIAGNOSIS — R200 Anesthesia of skin: Secondary | ICD-10-CM | POA: Diagnosis not present

## 2022-08-05 ENCOUNTER — Ambulatory Visit: Payer: PPO | Admitting: Oncology

## 2022-08-05 ENCOUNTER — Other Ambulatory Visit: Payer: PPO

## 2022-08-05 ENCOUNTER — Inpatient Hospital Stay: Payer: PPO | Attending: Obstetrics and Gynecology

## 2022-08-05 ENCOUNTER — Inpatient Hospital Stay (HOSPITAL_BASED_OUTPATIENT_CLINIC_OR_DEPARTMENT_OTHER): Payer: PPO | Admitting: Oncology

## 2022-08-05 ENCOUNTER — Encounter: Payer: Self-pay | Admitting: Oncology

## 2022-08-05 VITALS — BP 167/82 | HR 57 | Resp 16 | Wt 101.3 lb

## 2022-08-05 DIAGNOSIS — R5383 Other fatigue: Secondary | ICD-10-CM | POA: Diagnosis not present

## 2022-08-05 DIAGNOSIS — C55 Malignant neoplasm of uterus, part unspecified: Secondary | ICD-10-CM | POA: Diagnosis not present

## 2022-08-05 DIAGNOSIS — R42 Dizziness and giddiness: Secondary | ICD-10-CM

## 2022-08-05 DIAGNOSIS — C775 Secondary and unspecified malignant neoplasm of intrapelvic lymph nodes: Secondary | ICD-10-CM | POA: Insufficient documentation

## 2022-08-05 DIAGNOSIS — R519 Headache, unspecified: Secondary | ICD-10-CM

## 2022-08-05 DIAGNOSIS — R971 Elevated cancer antigen 125 [CA 125]: Secondary | ICD-10-CM | POA: Insufficient documentation

## 2022-08-05 DIAGNOSIS — Z86000 Personal history of in-situ neoplasm of breast: Secondary | ICD-10-CM | POA: Insufficient documentation

## 2022-08-05 DIAGNOSIS — C541 Malignant neoplasm of endometrium: Secondary | ICD-10-CM | POA: Diagnosis not present

## 2022-08-05 DIAGNOSIS — D6481 Anemia due to antineoplastic chemotherapy: Secondary | ICD-10-CM

## 2022-08-05 LAB — CBC WITH DIFFERENTIAL/PLATELET
Abs Immature Granulocytes: 0.02 10*3/uL (ref 0.00–0.07)
Basophils Absolute: 0 10*3/uL (ref 0.0–0.1)
Basophils Relative: 1 %
Eosinophils Absolute: 0.1 10*3/uL (ref 0.0–0.5)
Eosinophils Relative: 2 %
HCT: 34.5 % — ABNORMAL LOW (ref 36.0–46.0)
Hemoglobin: 12.2 g/dL (ref 12.0–15.0)
Immature Granulocytes: 0 %
Lymphocytes Relative: 17 %
Lymphs Abs: 0.8 10*3/uL (ref 0.7–4.0)
MCH: 34.2 pg — ABNORMAL HIGH (ref 26.0–34.0)
MCHC: 35.4 g/dL (ref 30.0–36.0)
MCV: 96.6 fL (ref 80.0–100.0)
Monocytes Absolute: 0.5 10*3/uL (ref 0.1–1.0)
Monocytes Relative: 10 %
Neutro Abs: 3.3 10*3/uL (ref 1.7–7.7)
Neutrophils Relative %: 70 %
Platelets: 109 10*3/uL — ABNORMAL LOW (ref 150–400)
RBC: 3.57 MIL/uL — ABNORMAL LOW (ref 3.87–5.11)
RDW: 11.4 % — ABNORMAL LOW (ref 11.5–15.5)
WBC: 4.7 10*3/uL (ref 4.0–10.5)
nRBC: 0 % (ref 0.0–0.2)

## 2022-08-05 LAB — COMPREHENSIVE METABOLIC PANEL
ALT: 20 U/L (ref 0–44)
AST: 27 U/L (ref 15–41)
Albumin: 4.2 g/dL (ref 3.5–5.0)
Alkaline Phosphatase: 99 U/L (ref 38–126)
Anion gap: 6 (ref 5–15)
BUN: 14 mg/dL (ref 8–23)
CO2: 25 mmol/L (ref 22–32)
Calcium: 8.9 mg/dL (ref 8.9–10.3)
Chloride: 99 mmol/L (ref 98–111)
Creatinine, Ser: 0.92 mg/dL (ref 0.44–1.00)
GFR, Estimated: >60 mL/min (ref >60)
Glucose, Bld: 96 mg/dL (ref 70–99)
Potassium: 4.3 mmol/L (ref 3.5–5.1)
Sodium: 130 mmol/L — ABNORMAL LOW (ref 135–145)
Total Bilirubin: 0.4 mg/dL (ref 0.3–1.2)
Total Protein: 7 g/dL (ref 6.5–8.1)

## 2022-08-05 LAB — FERRITIN: Ferritin: 345 ng/mL — ABNORMAL HIGH (ref 11–307)

## 2022-08-05 LAB — IRON AND TIBC
Iron: 94 ug/dL (ref 28–170)
Saturation Ratios: 42 % — ABNORMAL HIGH (ref 10.4–31.8)
TIBC: 224 ug/dL — ABNORMAL LOW (ref 250–450)
UIBC: 130 ug/dL

## 2022-08-05 LAB — VITAMIN B12: Vitamin B-12: 890 pg/mL (ref 180–914)

## 2022-08-05 NOTE — Progress Notes (Signed)
Hematology/Oncology Consult note [facility]  [phone] [phone]  Patient Care Team: Adin Hector, MD as PCP - General (Internal Medicine) Rockwell Germany, RN as Oncology Nurse Navigator Mauro Kaufmann, RN as Oncology Nurse Navigator Rolm Bookbinder, MD as Consulting Physician (General Surgery) Eppie Gibson, MD as Attending Physician (Radiation Oncology) Rubie Maid, MD as Consulting Physician (Obstetrics and Gynecology) Clent Jacks, RN as Oncology Nurse Navigator Sindy Guadeloupe, MD as Consulting Physician (Oncology)   Name of the patient: Elizabeth Friedman  [id number]  06-28-1941   Date of visit: 08/05/22  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  Initially she saw GI but given her history of pessary placement for 20 years she decided to see GYN.  She had an ultrasound pelvis which showed mixed echogenicity mass in the endometrial canal measuring 3.7 x 4.1 x 3.3 cm.  She was also having some vaginal spotting and brown discharge.  She underwent hysteroscopy with removal of endometrial mass and D&C which showed carcinosarcoma/malignant mixed mllerian tumor.   CT chest abdomen and pelvis showed enhancing lesion in the anterior fundus but no other evidence of distant metastatic disease in chest abdomen and pelvis.   Patient also has a prior history of right breast DCIS s/p lumpectomy and radiation treatment in 2021.  She was following up with Dr. Jana Hakim at [place] previously.  She is not on any endocrine therapy for the same.   Patient underwent total laparoscopic hysterectomy with bilateral salpingo-oophorectomy and bilateral pelvic sentinel lymph node injection mapping and biopsies on 12/24/2021.  Final  pathology showed a 3.9 cm carcinosarcoma.  Less than 50% myometrial invasion.  Uterine serosa not identified.  Malignant cells were present in the peritoneal/ascites fluid.  Lymphatic invasion not identified.  Margins negative.  2 sentinel lymph nodes on the right and left iliac were negative for malignancy.  No evidence of malignancy in the fallopian tubes.  Cystadenofibroma noted in the ovaries pT1a pN0 FIGO stage IA   Plan is to proceed with adjuvant CarboTaxol chemotherapy every 3 weeks x6 cycles if patient can tolerate it especially given positive pelvic washings.  Followed by vaginal brachytherapy      Interval history-patient reports that she has not been feeling well over the last week.  She has experienced more fatigue.  On 3 separate occasions she had symptoms of vertigo which resolved after a few minutes.  Denies any fever or cough.  Denies any urinary complaints.  Occasional nausea.  ECOG PS- 1 Pain scale- 0   Review of systems- Review of Systems  Constitutional:  Negative for chills, fever, malaise/fatigue and weight loss.  HENT:  Negative for congestion, ear discharge and nosebleeds.   Eyes:  Negative for blurred vision.  Respiratory:  Negative for cough, hemoptysis, sputum production, shortness of breath and wheezing.   Cardiovascular:  Negative for chest pain, palpitations, orthopnea and claudication.  Gastrointestinal:  Negative for abdominal pain, blood in stool, constipation, diarrhea, heartburn, melena, nausea and vomiting.  Genitourinary:  Negative for dysuria, flank pain, frequency, hematuria and urgency.  Musculoskeletal:  Negative for back pain, joint pain and myalgias.  Skin:  Negative for rash.  Neurological:  Positive for dizziness. Negative for tingling, focal weakness, seizures, weakness and headaches.  Endo/Heme/Allergies:  Does not  bruise/bleed easily.  Psychiatric/Behavioral:  Negative for depression and suicidal ideas. The patient does not have insomnia.        No Known Allergies   Past Medical History:  Diagnosis Date   Allergic rhinitis    Anemia    Anginal pain (HCC)    Arthritis    Atypical chest pain    Breast cancer ([place])    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[place])    Ductal carcinoma in situ (DCIS) of right breast    Fibrocystic breast disease    GERD (gastroesophageal reflux disease)    Hearing aid worn    bilateral   History of hiatal hernia    Hyperlipidemia    Lung nodule    Menopause    Migraine    Migraine    optical migraines   Motion sickness    Plantar fasciitis    Prolapse of female pelvic organs    hodge pessary   Raynaud's disease    Sciatic leg pain    Sciatica    Skin cancer of face    Urinary retention with incomplete bladder emptying    Uterine carcinosarcoma ([place]) 11/2021   Vitreous detachment of both eyes      Past Surgical History:  Procedure Laterality Date   APPENDECTOMY  1951   BREAST LUMPECTOMY WITH RADIOACTIVE SEED LOCALIZATION Right 05/29/2020   Procedure: RIGHT BREAST LUMPECTOMY WITH RADIOACTIVE SEED LOCALIZATION;  Surgeon: Rolm Bookbinder, MD;  Location: [place];  Service: General;  Laterality: Right;   CARDIAC CATHETERIZATION  2004   negative   CATARACT EXTRACTION W/PHACO Right 02/26/2021   Procedure: CATARACT EXTRACTION PHACO AND INTRAOCULAR LENS PLACEMENT (IOC) RIGHT 5.86 01:19.6 7.4%;  Surgeon: Leandrew Koyanagi, MD;  Location: [place];  Service: Ophthalmology;  Laterality: Right;   CATARACT EXTRACTION W/PHACO Left 09/03/2021   Procedure: CATARACT EXTRACTION PHACO AND INTRAOCULAR LENS PLACEMENT ([place]) LEFT;  Surgeon: Leandrew Koyanagi, MD;  Location: [place];  Service: Ophthalmology;  Laterality: Left;  9.61 01:15.9   COLONOSCOPY     2000, 2006, 2012   COLONOSCOPY WITH PROPOFOL N/A 03/27/2016   Procedure: COLONOSCOPY WITH PROPOFOL;  Surgeon: Manya Silvas, MD;  Location: [facility];  Service: Endoscopy;  Laterality: N/A;   COLONOSCOPY WITH  PROPOFOL N/A 08/14/2020   Procedure: COLONOSCOPY WITH PROPOFOL;  Surgeon: Toledo, Benay Pike, MD;  Location: [facility] ENDOSCOPY;  Service: Gastroenterology;  Laterality: N/A;   CYSTOSCOPY N/A 12/24/2021   Procedure: CYSTOSCOPY;  Surgeon: Jaquita Folds, MD;  Location: [facility];  Service: Gynecology;  Laterality: N/A;   DILATATION & CURETTAGE/HYSTEROSCOPY WITH MYOSURE N/A 12/01/2021   Procedure: DILATATION & CURETTAGE/HYSTEROSCOPY WITH MYOSURE;  Surgeon: Rubie Maid, MD;  Location: [facility];  Service: Gynecology;  Laterality: N/A;   ESOPHAGOGASTRODUODENOSCOPY  08/1999   ESOPHAGOGASTRODUODENOSCOPY (EGD) WITH PROPOFOL N/A 08/14/2020   Procedure: ESOPHAGOGASTRODUODENOSCOPY (EGD) WITH PROPOFOL;  Surgeon: Toledo, Benay Pike, MD;  Location: [facility];  Service: Gastroenterology;  Laterality: N/A;   ESOPHAGOGASTRODUODENOSCOPY (EGD) WITH PROPOFOL N/A 03/21/2021   Procedure: ESOPHAGOGASTRODUODENOSCOPY (EGD) WITH PROPOFOL;  Surgeon: Toledo, Benay Pike, MD;  Location: [facility];  Service: Gastroenterology;  Laterality: N/A;   PORTA CATH INSERTION N/A 01/15/2022   Procedure: PORTA CATH INSERTION;  Surgeon: Algernon Huxley, MD;  Location: [place] [facility];  Service: Cardiovascular;  Laterality: N/A;   PORTA CATH REMOVAL N/A 06/15/2022   Procedure: PORTA CATH REMOVAL;  Surgeon: Algernon Huxley, MD;  Location: [place] [facility];  Service: Cardiovascular;  Laterality: N/A;   SKIN CANCER  EXCISION     face and neck   TOTAL LAPAROSCOPIC HYSTERECTOMY WITH BILATERAL SALPINGO OOPHORECTOMY Bilateral 12/24/2021   Procedure: TOTAL LAPAROSCOPIC HYSTERECTOMY WITH BILATERAL SALPINGO OOPHORECTOMY, PELVIC NODE BIOPSY,  PESSARY REMOVAL (PESSARY REMOVAL BY DR. Wannetta Sender;  Surgeon: Mellody Drown, MD;  Location: [facility];  Service: Gynecology;  Laterality: Bilateral;    Social History   Socioeconomic History   Marital status: Widowed    Spouse name: Not on file   Number of children: Not on file   Years of  education: Not on file   Highest education level: Not on file  Occupational History   Not on file  Tobacco Use   Smoking status: Never   Smokeless tobacco: Never  Vaping Use   Vaping Use: Never used  Substance and Sexual Activity   Alcohol use: No   Drug use: No   Sexual activity: Not Currently    Birth control/protection: Post-menopausal  Other Topics Concern   Not on file  Social History Narrative   Lives alone   Social Determinants of Health   Financial Resource Strain: Not on file  Food Insecurity: Not on file  Transportation Needs: Not on file  Physical Activity: Sufficiently Active (12/21/2017)   Exercise Vital Sign    Days of Exercise per Week: 3 days    Minutes of Exercise per Session: 60 min  Stress: Not on file  Social Connections: Not on file  Intimate Partner Violence: Not on file    Family History  Problem Relation Age of Onset   Breast cancer Mother    Heart disease Father    Colon cancer Brother    Diabetes Neg Hx    Ovarian cancer Neg Hx      Current Outpatient Medications:    acetaminophen (TYLENOL) 500 MG tablet, Take 1,000 mg by mouth every 6 (six) hours as needed for moderate pain or mild pain., Disp: , Rfl:    Calcium-Vitamin D (CALTRATE 600 PLUS-VIT D PO), Take 1 tablet by mouth in the morning., Disp: , Rfl:    Carboxymethylcellulose Sod PF 1 % GEL, Place 1 drop into both eyes at bedtime., Disp: , Rfl:    celecoxib (CELEBREX) 200 MG capsule, Take 200 mg by mouth 2 (two) times daily., Disp: , Rfl:    Cholecalciferol (VITAMIN D) 125 MCG (5000 UT) CAPS, Take 5,000 Units by mouth in the morning., Disp: , Rfl:    Coenzyme Q10 200 MG TABS, Take 200 mg by mouth in the morning., Disp: , Rfl:    Cyanocobalamin (VITAMIN B-12 PO), Take 1 tablet by mouth in the morning., Disp: , Rfl:    gabapentin (NEURONTIN) 300 MG capsule, Take 1 capsule (300 mg total) by mouth 2 (two) times daily. (Patient taking differently: Take 400 mg by mouth 2 (two) times daily.),  Disp: 60 capsule, Rfl: 2   Iron-Vitamin C (VITRON-C) 65-125 MG TABS, Take 1 tablet by mouth in the morning., Disp: , Rfl:    magnesium hydroxide (MILK OF MAGNESIA) 400 MG/5ML suspension, Take by mouth daily as needed for mild constipation., Disp: , Rfl:    pantoprazole (PROTONIX) 40 MG tablet, Take 40 mg by mouth in the morning., Disp: , Rfl:    RESTASIS 0.05 % ophthalmic emulsion, Place 1 drop into both eyes 2 (two) times daily., Disp: , Rfl:    simvastatin (ZOCOR) 10 MG tablet, Take 10 mg by mouth every evening., Disp: , Rfl:    topiramate (TOPAMAX) 50 MG tablet, Take 50 mg by mouth every  evening., Disp: , Rfl:    Triamcinolone Acetonide (NASACORT ALLERGY 24HR NA), Place 1 spray into the nose daily as needed (sinus congestion)., Disp: , Rfl:    vitamin E 180 MG (400 UNITS) capsule, Take 400 Units by mouth in the morning., Disp: , Rfl:    alum & mag hydroxide-simeth (MAALOX/MYLANTA) 852-778-24 MG/5ML suspension, Take 30 mLs by mouth every 6 (six) hours as needed for indigestion or heartburn. (Patient not taking: Reported on 08/05/2022), Disp: , Rfl:   Physical exam:  Vitals:   08/05/22 1117  BP: (!) 167/82  Pulse: (!) 57  Resp: 16  Weight: 101 lb 4.8 oz (45.9 kg)   Physical Exam Constitutional:      General: She is not in acute distress. Cardiovascular:     Rate and Rhythm: Normal rate and regular rhythm.     Heart sounds: Normal heart sounds.  Pulmonary:     Effort: Pulmonary effort is normal.     Breath sounds: Normal breath sounds.  Abdominal:     General: Bowel sounds are normal.     Palpations: Abdomen is soft.  Skin:    General: Skin is warm and dry.  Neurological:     Mental Status: She is alert and oriented to person, place, and time.     Sensory: No sensory deficit.     Motor: No weakness.     Coordination: Coordination normal.     Gait: Gait normal.         Latest Ref Rng & Units 08/05/2022   10:42 AM  CMP  Glucose 70 - 99 mg/dL 96   BUN 8 - 23 mg/dL 14    Creatinine 0.44 - 1.00 mg/dL 0.92   Sodium 135 - 145 mmol/L 130   Potassium 3.5 - 5.1 mmol/L 4.3   Chloride 98 - 111 mmol/L 99   CO2 22 - 32 mmol/L 25   Calcium 8.9 - 10.3 mg/dL 8.9   Total Protein 6.5 - 8.1 g/dL 7.0   Total Bilirubin 0.3 - 1.2 mg/dL 0.4   Alkaline Phos 38 - 126 U/L 99   AST 15 - 41 U/L 27   ALT 0 - 44 U/L 20       Latest Ref Rng & Units 08/05/2022   10:42 AM  CBC  WBC 4.0 - 10.5 K/uL 4.7   Hemoglobin 12.0 - 15.0 g/dL 12.2   Hematocrit 36.0 - 46.0 % 34.5   Platelets 150 - 400 K/uL 109      Assessment and plan- Patient is a 81 y.o. female with stage I apT1 aN0 M0 carcinosarcoma of the uterus.  She is here for routine follow-up of uterine cancer  Clinically patient is doing well with no concerning signs and symptoms of recurrence based on today's exam.A1 25 from today is pending.  She will be seeing GYN oncology in January 2023 and I will see her back in 6 months with labs.  Vertigo: No evidence of dysdiadochokinesia on my exam today.  Given her 3 episodes of vertigo over the last 1 week I am getting an MRI brain with and without contrast ASAP.  She is also seeing ENT Dr. Yvone Neu tomorrow.  Clinically there appears to be no signs and symptoms of infection.  I will call her after the results of MRI are back.   Visit Diagnosis 1. Endometrial cancer ([place])   2. Headache in front of head   3. Dizziness and giddiness      Dr. Randa Evens, MD, MPH  [facility] [phone] 08/05/2022 1:13 PM

## 2022-08-05 NOTE — Progress Notes (Signed)
Pt states she has been having GI issues for the past two weeks. Pt states she believes she has TIA on Monday of last wek art rt arm was very weak and tired in the muscle; sxs lasted until yesterday. On Wednesday evening her vision would not focus but did not last a long period of time; did not feel well after it happened wasn't doing anything physical. Thursday night; had a vertigo episode (came and went very quick). Yesterday, while out with friends pt became very nauseous; slept okay last night.

## 2022-08-07 ENCOUNTER — Telehealth: Payer: Self-pay

## 2022-08-07 ENCOUNTER — Emergency Department: Payer: PPO

## 2022-08-07 ENCOUNTER — Ambulatory Visit
Admission: RE | Admit: 2022-08-07 | Discharge: 2022-08-07 | Disposition: A | Discharge status: Home / Self Care | Payer: PPO | Source: Ambulatory Visit | Attending: Oncology | Admitting: Oncology

## 2022-08-07 ENCOUNTER — Inpatient Hospital Stay
Admission: EM | Admit: 2022-08-07 | Discharge: 2022-08-10 | DRG: 064 | Disposition: A | Discharge status: Home Health | Payer: PPO | Attending: Osteopathic Medicine | Admitting: Osteopathic Medicine

## 2022-08-07 ENCOUNTER — Encounter: Payer: Self-pay | Admitting: Emergency Medicine

## 2022-08-07 ENCOUNTER — Other Ambulatory Visit: Payer: Self-pay

## 2022-08-07 DIAGNOSIS — R42 Dizziness and giddiness: Secondary | ICD-10-CM

## 2022-08-07 DIAGNOSIS — Z8542 Personal history of malignant neoplasm of other parts of uterus: Secondary | ICD-10-CM | POA: Diagnosis not present

## 2022-08-07 DIAGNOSIS — Z791 Long term (current) use of non-steroidal anti-inflammatories (NSAID): Secondary | ICD-10-CM | POA: Diagnosis not present

## 2022-08-07 DIAGNOSIS — D7389 Other diseases of spleen: Secondary | ICD-10-CM | POA: Diagnosis not present

## 2022-08-07 DIAGNOSIS — Z8719 Personal history of other diseases of the digestive system: Secondary | ICD-10-CM

## 2022-08-07 DIAGNOSIS — I6340 Cerebral infarction due to embolism of unspecified cerebral artery: Secondary | ICD-10-CM | POA: Diagnosis not present

## 2022-08-07 DIAGNOSIS — Z01818 Encounter for other preprocedural examination: Secondary | ICD-10-CM | POA: Diagnosis not present

## 2022-08-07 DIAGNOSIS — K753 Granulomatous hepatitis, not elsewhere classified: Secondary | ICD-10-CM | POA: Diagnosis not present

## 2022-08-07 DIAGNOSIS — I6359 Cerebral infarction due to unspecified occlusion or stenosis of other cerebral artery: Secondary | ICD-10-CM | POA: Diagnosis not present

## 2022-08-07 DIAGNOSIS — E785 Hyperlipidemia, unspecified: Secondary | ICD-10-CM | POA: Diagnosis not present

## 2022-08-07 DIAGNOSIS — K219 Gastro-esophageal reflux disease without esophagitis: Secondary | ICD-10-CM | POA: Diagnosis not present

## 2022-08-07 DIAGNOSIS — R519 Headache, unspecified: Secondary | ICD-10-CM

## 2022-08-07 DIAGNOSIS — R918 Other nonspecific abnormal finding of lung field: Secondary | ICD-10-CM | POA: Diagnosis not present

## 2022-08-07 DIAGNOSIS — C541 Malignant neoplasm of endometrium: Secondary | ICD-10-CM | POA: Insufficient documentation

## 2022-08-07 DIAGNOSIS — Z803 Family history of malignant neoplasm of breast: Secondary | ICD-10-CM

## 2022-08-07 DIAGNOSIS — I63532 Cerebral infarction due to unspecified occlusion or stenosis of left posterior cerebral artery: Secondary | ICD-10-CM | POA: Diagnosis not present

## 2022-08-07 DIAGNOSIS — R29700 NIHSS score 0: Secondary | ICD-10-CM | POA: Diagnosis present

## 2022-08-07 DIAGNOSIS — Z66 Do not resuscitate: Secondary | ICD-10-CM | POA: Diagnosis not present

## 2022-08-07 DIAGNOSIS — I6523 Occlusion and stenosis of bilateral carotid arteries: Secondary | ICD-10-CM | POA: Diagnosis not present

## 2022-08-07 DIAGNOSIS — K661 Hemoperitoneum: Secondary | ICD-10-CM | POA: Diagnosis not present

## 2022-08-07 DIAGNOSIS — R971 Elevated cancer antigen 125 [CA 125]: Secondary | ICD-10-CM

## 2022-08-07 DIAGNOSIS — Z1152 Encounter for screening for COVID-19: Secondary | ICD-10-CM

## 2022-08-07 DIAGNOSIS — Z923 Personal history of irradiation: Secondary | ICD-10-CM

## 2022-08-07 DIAGNOSIS — Z8673 Personal history of transient ischemic attack (TIA), and cerebral infarction without residual deficits: Secondary | ICD-10-CM

## 2022-08-07 DIAGNOSIS — Z79899 Other long term (current) drug therapy: Secondary | ICD-10-CM | POA: Diagnosis not present

## 2022-08-07 DIAGNOSIS — Z9221 Personal history of antineoplastic chemotherapy: Secondary | ICD-10-CM

## 2022-08-07 DIAGNOSIS — Z85828 Personal history of other malignant neoplasm of skin: Secondary | ICD-10-CM | POA: Diagnosis not present

## 2022-08-07 DIAGNOSIS — G459 Transient cerebral ischemic attack, unspecified: Principal | ICD-10-CM

## 2022-08-07 DIAGNOSIS — D509 Iron deficiency anemia, unspecified: Secondary | ICD-10-CM | POA: Diagnosis present

## 2022-08-07 DIAGNOSIS — Z853 Personal history of malignant neoplasm of breast: Secondary | ICD-10-CM

## 2022-08-07 DIAGNOSIS — R0600 Dyspnea, unspecified: Secondary | ICD-10-CM | POA: Diagnosis not present

## 2022-08-07 DIAGNOSIS — Z0389 Encounter for observation for other suspected diseases and conditions ruled out: Secondary | ICD-10-CM | POA: Diagnosis not present

## 2022-08-07 DIAGNOSIS — R7989 Other specified abnormal findings of blood chemistry: Secondary | ICD-10-CM | POA: Diagnosis present

## 2022-08-07 DIAGNOSIS — Z8249 Family history of ischemic heart disease and other diseases of the circulatory system: Secondary | ICD-10-CM | POA: Diagnosis not present

## 2022-08-07 DIAGNOSIS — I651 Occlusion and stenosis of basilar artery: Secondary | ICD-10-CM | POA: Diagnosis not present

## 2022-08-07 DIAGNOSIS — J479 Bronchiectasis, uncomplicated: Secondary | ICD-10-CM | POA: Diagnosis not present

## 2022-08-07 DIAGNOSIS — R29818 Other symptoms and signs involving the nervous system: Secondary | ICD-10-CM | POA: Diagnosis not present

## 2022-08-07 DIAGNOSIS — I214 Non-ST elevation (NSTEMI) myocardial infarction: Secondary | ICD-10-CM | POA: Diagnosis not present

## 2022-08-07 DIAGNOSIS — I6381 Other cerebral infarction due to occlusion or stenosis of small artery: Secondary | ICD-10-CM | POA: Diagnosis not present

## 2022-08-07 DIAGNOSIS — R4182 Altered mental status, unspecified: Secondary | ICD-10-CM | POA: Diagnosis not present

## 2022-08-07 DIAGNOSIS — I10 Essential (primary) hypertension: Secondary | ICD-10-CM | POA: Diagnosis not present

## 2022-08-07 DIAGNOSIS — I639 Cerebral infarction, unspecified: Secondary | ICD-10-CM | POA: Diagnosis not present

## 2022-08-07 DIAGNOSIS — I6310 Cerebral infarction due to embolism of unspecified precerebral artery: Secondary | ICD-10-CM | POA: Diagnosis not present

## 2022-08-07 DIAGNOSIS — I700 Atherosclerosis of aorta: Secondary | ICD-10-CM | POA: Diagnosis not present

## 2022-08-07 LAB — ETHANOL: Alcohol, Ethyl (B): <10 mg/dL (ref <10)

## 2022-08-07 LAB — COMPREHENSIVE METABOLIC PANEL
ALT: 18 U/L (ref 0–44)
AST: 25 U/L (ref 15–41)
Albumin: 4.3 g/dL (ref 3.5–5.0)
Alkaline Phosphatase: 92 U/L (ref 38–126)
Anion gap: 8 (ref 5–15)
BUN: 15 mg/dL (ref 8–23)
CO2: 24 mmol/L (ref 22–32)
Calcium: 9.5 mg/dL (ref 8.9–10.3)
Chloride: 103 mmol/L (ref 98–111)
Creatinine, Ser: 0.79 mg/dL (ref 0.44–1.00)
GFR, Estimated: >60 mL/min (ref >60)
Glucose, Bld: 117 mg/dL — ABNORMAL HIGH (ref 70–99)
Potassium: 4 mmol/L (ref 3.5–5.1)
Sodium: 135 mmol/L (ref 135–145)
Total Bilirubin: 0.6 mg/dL (ref 0.3–1.2)
Total Protein: 7.2 g/dL (ref 6.5–8.1)

## 2022-08-07 LAB — RESP PANEL BY RT-PCR (FLU A&B, COVID) ARPGX2
Influenza A by PCR: NEGATIVE
Influenza B by PCR: NEGATIVE
SARS Coronavirus 2 by RT PCR: NEGATIVE

## 2022-08-07 LAB — DIFFERENTIAL
Abs Immature Granulocytes: 0.03 10*3/uL (ref 0.00–0.07)
Basophils Absolute: 0 10*3/uL (ref 0.0–0.1)
Basophils Relative: 0 %
Eosinophils Absolute: 0 10*3/uL (ref 0.0–0.5)
Eosinophils Relative: 1 %
Immature Granulocytes: 1 %
Lymphocytes Relative: 13 %
Lymphs Abs: 0.7 10*3/uL (ref 0.7–4.0)
Monocytes Absolute: 0.4 10*3/uL (ref 0.1–1.0)
Monocytes Relative: 7 %
Neutro Abs: 4.3 10*3/uL (ref 1.7–7.7)
Neutrophils Relative %: 78 %

## 2022-08-07 LAB — CBC
HCT: 35.6 % — ABNORMAL LOW (ref 36.0–46.0)
Hemoglobin: 12 g/dL (ref 12.0–15.0)
MCH: 33.2 pg (ref 26.0–34.0)
MCHC: 33.7 g/dL (ref 30.0–36.0)
MCV: 98.6 fL (ref 80.0–100.0)
Platelets: 121 10*3/uL — ABNORMAL LOW (ref 150–400)
RBC: 3.61 MIL/uL — ABNORMAL LOW (ref 3.87–5.11)
RDW: 11.6 % (ref 11.5–15.5)
WBC: 5.5 10*3/uL (ref 4.0–10.5)
nRBC: 0 % (ref 0.0–0.2)

## 2022-08-07 LAB — PROTIME-INR
INR: 1.2 (ref 0.8–1.2)
Prothrombin Time: 15.1 seconds (ref 11.4–15.2)

## 2022-08-07 LAB — CA 125: Cancer Antigen (CA) 125: 47.6 U/mL — ABNORMAL HIGH (ref 0.0–38.1)

## 2022-08-07 LAB — APTT: aPTT: 23 seconds — ABNORMAL LOW (ref 24–36)

## 2022-08-07 MED ORDER — VITAMIN D 25 MCG (1000 UNIT) PO TABS
5000.0000 [IU] | ORAL_TABLET | Freq: Every morning | ORAL | Status: DC
  Administered 2022-08-08 – 2022-08-10 (×3): 5000 [IU] via ORAL
  Filled 2022-08-07 (×4): qty 5

## 2022-08-07 MED ORDER — TOPIRAMATE 25 MG PO TABS
50.0000 mg | ORAL_TABLET | Freq: Every evening | ORAL | Status: DC
  Administered 2022-08-08 – 2022-08-09 (×3): 50 mg via ORAL
  Filled 2022-08-07 (×4): qty 2

## 2022-08-07 MED ORDER — IOHEXOL 300 MG/ML  SOLN
80.0000 mL | Freq: Once | INTRAMUSCULAR | Status: AC | PRN
  Administered 2022-08-07: 80 mL via INTRAVENOUS

## 2022-08-07 MED ORDER — ALUM & MAG HYDROXIDE-SIMETH 200-200-20 MG/5ML PO SUSP: 30.0000 mL | Freq: Four times a day (QID) | ORAL | Status: DC | PRN

## 2022-08-07 MED ORDER — LACTATED RINGERS IV SOLN: INTRAVENOUS | Status: AC

## 2022-08-07 MED ORDER — GABAPENTIN 300 MG PO CAPS
300.0000 mg | ORAL_CAPSULE | Freq: Two times a day (BID) | ORAL | Status: DC
  Administered 2022-08-08 – 2022-08-10 (×6): 300 mg via ORAL
  Filled 2022-08-07 (×6): qty 1

## 2022-08-07 MED ORDER — STROKE: EARLY STAGES OF RECOVERY BOOK: Freq: Once | Status: AC

## 2022-08-07 MED ORDER — GADOBUTROL 1 MMOL/ML IV SOLN
4.0000 mL | Freq: Once | INTRAVENOUS | Status: AC | PRN
  Administered 2022-08-07: 4 mL via INTRAVENOUS

## 2022-08-07 MED ORDER — SODIUM CHLORIDE 0.9% FLUSH: 3.0000 mL | Freq: Once | INTRAVENOUS | Status: DC

## 2022-08-07 MED ORDER — COENZYME Q10 200 MG PO TABS: 200.0000 mg | ORAL_TABLET | Freq: Every morning | ORAL | Status: DC

## 2022-08-07 MED ORDER — CELECOXIB 200 MG PO CAPS: 200.0000 mg | ORAL_CAPSULE | Freq: Two times a day (BID) | ORAL | Status: DC

## 2022-08-07 MED ORDER — FE FUMARATE-B12-VIT C-FA-IFC PO CAPS
1.0000 | ORAL_CAPSULE | Freq: Every morning | ORAL | Status: DC
  Filled 2022-08-07 (×3): qty 1

## 2022-08-07 MED ORDER — ACETAMINOPHEN 500 MG PO TABS
1000.0000 mg | ORAL_TABLET | Freq: Four times a day (QID) | ORAL | Status: DC | PRN
  Administered 2022-08-09: 1000 mg via ORAL
  Filled 2022-08-07: qty 2

## 2022-08-07 MED ORDER — PANTOPRAZOLE SODIUM 40 MG PO TBEC
40.0000 mg | DELAYED_RELEASE_TABLET | Freq: Every morning | ORAL | Status: DC
  Administered 2022-08-08 – 2022-08-10 (×3): 40 mg via ORAL
  Filled 2022-08-07 (×4): qty 1

## 2022-08-07 NOTE — ED Provider Notes (Signed)
[facility] Provider Note    Event Date/Time   First MD Initiated Contact with Patient 08/07/22 2001     (approximate)   History   Dizziness   HPI  MELADY CHOW is a 81 y.o. female   Past medical history of breast and uterine cancer with chemotherapy over 1 month ago, finished radiation therapy recently who presents to the emergency department today with several discrete episodes of sudden onset dizziness that spontaneously resolves after a few seconds.  This is dating back several weeks increasing in frequency.  She had a large episode of severe dizziness earlier today.  She also had a transient episode of right arm weakness on Wednesday that has now completely resolved.  In the emergency department at this moment, all symptoms is resolved and she feels well.    Denies chest pain, abdominal pain nausea or vomiting, and denies falls or head strike.  Denies recent illnesses including respiratory infectious symptoms, dysuria, and no other medical complaints.  No history of strokes or TIA.  She is not on blood thinners.   History was obtained via the patient and review of external medical notes including her oncology note from 08/05/2022 noting episodes of vertigo and an order for an outpatient MRI at that time.      Physical Exam   Triage Vital Signs: ED Triage Vitals  Enc Vitals Group     BP 08/07/22 1449 (!) 181/91     Pulse Rate 08/07/22 1449 70     Resp 08/07/22 1449 15     Temp 08/07/22 1449 97.7 F (36.5 C)     Temp Source 08/07/22 1449 Oral     SpO2 08/07/22 1449 100 %     Weight 08/07/22 1441 101 lb 3.1 oz (45.9 kg)     Height 08/07/22 1441 '5\' 2"'$  (1.575 m)     Head Circumference --      Peak Flow --      Pain Score 08/07/22 1441 0     Pain Loc --      Pain Edu? --      Excl. in [place]? --     Most recent vital signs: Vitals:   08/07/22 1449 08/07/22 2134  BP: (!) 181/91 (!) 183/76  Pulse: 70 79  Resp: 15 16  Temp: 97.7 F (36.5 C)    SpO2: 100% 98%    General: Awake, no distress. Htn  CV:  Good peripheral perfusion.  Resp:  Normal effort.  Abd:  No distention.  Soft and nontender Other:  Alert and oriented, motor or sensory intact, finger-to-nose normal, extraocular movements intact, pupils equal round and reactive,   ED Results / Procedures / Treatments   Labs (all labs ordered are listed, but only abnormal results are displayed) Labs Reviewed  APTT - Abnormal; Notable for the following components:      Result Value   aPTT 23 (*)    All other components within normal limits  CBC - Abnormal; Notable for the following components:   RBC 3.61 (*)    HCT 35.6 (*)    Platelets 121 (*)    All other components within normal limits  COMPREHENSIVE METABOLIC PANEL - Abnormal; Notable for the following components:   Glucose, Bld 117 (*)    All other components within normal limits  RESP PANEL BY RT-PCR (FLU A&B, COVID) [id number]  PROTIME-INR  DIFFERENTIAL  ETHANOL  I-STAT CREATININE, ED  CBG MONITORING, ED     I reviewed  labs and they are notable for glucose 117  EKG  ED ECG REPORT I, Lucillie Garfinkel, the attending physician, personally viewed and interpreted this ECG.   Date: 08/07/2022  EKG Time: 2133  Rate: 75  Rhythm: normal EKG, normal sinus rhythm  Axis: normal  Intervals:no ischemic changes   RADIOLOGY I independently reviewed and interpreted CT scan of the head and see no obvious hemorrhage or midline shift.   PROCEDURES:  Critical Care performed: No  Procedures   MEDICATIONS ORDERED IN ED: Medications  sodium chloride flush (NS) 0.9 % injection 3 mL (has no administration in time range)  gadobutrol (GADAVIST) 1 MMOL/ML injection 4 mL (4 mLs Intravenous Contrast Given 08/07/22 2057)    Consultants:  I spoke with hospitalist  regarding care plan for this patient.   IMPRESSION / MDM / ASSESSMENT AND PLAN / ED COURSE  I reviewed the triage vital signs and the nursing notes.                               Differential diagnosis includes, but is not limited to, TIA, stroke, dysrhythmia, ACS, electrolyte derangement    MDM: Patient with discrete episodes of dizziness and also arm weakness, now completely resolved concerning for TIA.  Ordered for outpatient MRI that is scheduled for tomorrow night but increasing duration and frequency of these episodes will expedite work-up for TIA today including MRI brain with and without contrast following CT scan of the head.  Basic labs, EKG.  Admit for TIA work-up.  MRI w scattered foci of acute ischemia   Patient's presentation is most consistent with acute presentation with potential threat to life or bodily function.       FINAL CLINICAL IMPRESSION(S) / ED DIAGNOSES   Final diagnoses:  TIA (transient ischemic attack)     Rx / DC Orders   ED Discharge Orders     None        Note:  This document was prepared using Dragon voice recognition software and may include unintentional dictation errors.    Lucillie Garfinkel, MD 08/07/22 2136

## 2022-08-07 NOTE — Progress Notes (Signed)

## 2022-08-07 NOTE — H&P (Signed)
History and Physical    Cardiology:   Chief Complaint: Dizziness.   HISTORY OF PRESENT ILLNESS: Elizabeth Friedman is an 81 y.o. female for dizziness   LKN is unknown pt having trainsietn recurrent episode for long time. Breast and uterin cancer chemo oen motnh ago and fisnhwed raditaion therapy.  Dizziness/ vision loss / left arm weakness.  MRI pending. Profoudn dizziness.  MRI shows infarcts.  EKG does not  shows a.fib.  ? GIB.      Pt has PMH as below: 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HCC)    Ductal carcinoma in situ (DCIS) of right breast    Fibrocystic breast disease    GERD (gastroesophageal reflux disease)    Hearing aid worn    bilateral   History of hiatal hernia    Hyperlipidemia    Lung nodule    Menopause    Migraine    Migraine    optical migraines   Motion sickness    Plantar fasciitis    Prolapse of female pelvic organs    hodge pessary   Raynaud's disease    Sciatic leg pain    Sciatica    Skin cancer of face    Urinary retention with incomplete bladder emptying    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 The left ventricle has normal function. The left ventricle has no regional wall motion abnormalities. There is mild left ventricular hypertrophy. Left ventricular diastolic parameters are consistent with Grade I diastolic dysfunction (impaired relaxation). 1. Right ventricular systolic function is normal. The right ventricular size is normal. Tricuspid regurgitation signal is inadequate for assessing PA pressure. 2. The mitral valve is normal in structure. Mild mitral valve regurgitation. No evidence of mitral stenosis. 3. The aortic valve is normal in structure. Aortic valve  regurgitation is not visualized. No aortic stenosis is present. 4. The inferior vena cava is dilated in size with >50% respiratory variability, suggesting right atrial pressure of 8 mmHg.   Review of Systems  Eyes:  Positive for visual disturbance.  Neurological:  Positive for dizziness, weakness and numbness.      No Known Allergies   Past Surgical History:  Procedure Laterality Date   APPENDECTOMY  1951   BREAST LUMPECTOMY WITH RADIOACTIVE SEED LOCALIZATION Right 05/29/2020   Procedure: RIGHT BREAST LUMPECTOMY WITH RADIOACTIVE SEED LOCALIZATION;  Surgeon: Rolm Bookbinder, MD;  Location: [facility];  Service: General;  Laterality: Right;   CARDIAC CATHETERIZATION  2004   negative   CATARACT EXTRACTION W/PHACO Right 02/26/2021   Procedure: CATARACT EXTRACTION PHACO AND INTRAOCULAR LENS PLACEMENT ([facility]) RIGHT 5.86 01:19.6 7.4%;  Surgeon: Leandrew Koyanagi, MD;  Location: [facility];  Service: Ophthalmology;  Laterality: Right;   CATARACT EXTRACTION W/PHACO Left 09/03/2021   Procedure: CATARACT EXTRACTION PHACO AND INTRAOCULAR LENS PLACEMENT ([place]) LEFT;  Surgeon: Leandrew Koyanagi, MD;  Location: [facility];  Service: Ophthalmology;  Laterality: Left;  9.61 01:15.9   COLONOSCOPY     2000, 2006, 2012   COLONOSCOPY WITH PROPOFOL N/A 03/27/2016   Procedure: COLONOSCOPY WITH PROPOFOL;  Surgeon: Manya Silvas, MD;  Location: [facility];  Service: Endoscopy;  Laterality: N/A;   COLONOSCOPY WITH  PROPOFOL N/A 08/14/2020   Procedure: COLONOSCOPY WITH PROPOFOL;  Surgeon: Toledo, Benay Pike, MD;  Location: [facility] ENDOSCOPY;  Service: Gastroenterology;  Laterality: N/A;   CYSTOSCOPY N/A 12/24/2021   Procedure: CYSTOSCOPY;  Surgeon: Jaquita Folds, MD;  Location: [facility];  Service: Gynecology;  Laterality: N/A;   DILATATION & CURETTAGE/HYSTEROSCOPY WITH MYOSURE N/A 12/01/2021   Procedure: DILATATION & CURETTAGE/HYSTEROSCOPY WITH MYOSURE;   Surgeon: Rubie Maid, MD;  Location: [facility];  Service: Gynecology;  Laterality: N/A;   ESOPHAGOGASTRODUODENOSCOPY  08/1999   ESOPHAGOGASTRODUODENOSCOPY (EGD) WITH PROPOFOL N/A 08/14/2020   Procedure: ESOPHAGOGASTRODUODENOSCOPY (EGD) WITH PROPOFOL;  Surgeon: Toledo, Benay Pike, MD;  Location: [facility] ENDOSCOPY;  Service: Gastroenterology;  Laterality: N/A;   ESOPHAGOGASTRODUODENOSCOPY (EGD) WITH PROPOFOL N/A 03/21/2021   Procedure: ESOPHAGOGASTRODUODENOSCOPY (EGD) WITH PROPOFOL;  Surgeon: Toledo, Benay Pike, MD;  Location: [facility] END[facility]COPY;  Service: Gastroenterology;  Laterality: N/A;   PORTA CATH INSERTION N/A 01/15/2022   Procedure: PORTA CATH INSERTION;  Surgeon: Algernon Huxley, MD;  Location: [facility];  Service: Cardiovascular;  Laterality: N/A;   PORTA CATH REMOVAL N/A 06/15/2022   Procedure: PORTA CATH REMOVAL;  Surgeon: Algernon Huxley, MD;  Location: [facility];  Service: Cardiovascular;  Laterality: N/A;   SKIN CANCER EXCISION     face and neck   TOTAL LAPAROSCOPIC HYSTERECTOMY WITH BILATERAL SALPINGO OOPHORECTOMY Bilateral 12/24/2021   Procedure: TOTAL LAPAROSCOPIC HYSTERECTOMY WITH BILATERAL SALPINGO OOPHORECTOMY, PELVIC NODE BIOPSY,  PESSARY REMOVAL (PESSARY REMOVAL BY DR. Wannetta Sender;  Surgeon: Mellody Drown, MD;  Location: [facility];  Service: Gynecology;  Laterality: Bilateral;      Social History   Socioeconomic History   Marital status: Widowed    Spouse name: Not on file   Number of children: Not on file   Years of education: Not on file   Highest education level: Not on file  Occupational History   Not on file  Tobacco Use   Smoking status: Never   Smokeless tobacco: Never  Vaping Use   Vaping Use: Never used  Substance and Sexual Activity   Alcohol use: No   Drug use: No   Sexual activity: Not Currently    Birth control/protection: Post-menopausal  Other Topics Concern   Not on file  Social History Narrative   Lives alone   Social Determinants  of Health   Financial Resource Strain: Not on file  Food Insecurity: Not on file  Transportation Needs: Not on file  Physical Activity: Sufficiently Active (12/21/2017)   Exercise Vital Sign    Days of Exercise per Week: 3 days    Minutes of Exercise per Session: 60 min  Stress: Not on file  Social Connections: Not on file      CURRENT MEDS:     Current Outpatient Medications (Cardiovascular):    simvastatin (ZOCOR) 10 MG tablet, Take 10 mg by mouth every evening.   Current Outpatient Medications (Respiratory):    Triamcinolone Acetonide (NASACORT ALLERGY 24HR NA), Place 1 spray into the nose daily as needed (sinus congestion).   Current Outpatient Medications (Analgesics):    acetaminophen (TYLENOL) 500 MG tablet, Take 1,000 mg by mouth every 6 (six) hours as needed for moderate pain or mild pain.   celecoxib (CELEBREX) 200 MG capsule, Take 200 mg by mouth 2 (two) times daily.   Current Outpatient Medications (Hematological):    Cyanocobalamin (VITAMIN B-12 PO), Take 1 tablet by mouth in the morning.   Iron-Vitamin C (VITRON-C) 65-125 MG TABS, Take 1 tablet by mouth in  the morning.  Current Facility-Administered Medications (Other):    sodium chloride flush (NS) 0.9 % injection 3 mL  Current Outpatient Medications (Other):    alum & mag hydroxide-simeth (MAALOX/MYLANTA) 200-200-20 MG/5ML suspension, Take 30 mLs by mouth every 6 (six) hours as needed for indigestion or heartburn. (Patient not taking: Reported on 08/05/2022)   Calcium-Vitamin D (CALTRATE 600 PLUS-VIT D PO), Take 1 tablet by mouth in the morning.   Carboxymethylcellulose Sod PF 1 % GEL, Place 1 drop into both eyes at bedtime.   Cholecalciferol (VITAMIN D) 125 MCG (5000 UT) CAPS, Take 5,000 Units by mouth in the morning.   Coenzyme Q10 200 MG TABS, Take 200 mg by mouth in the morning.   gabapentin (NEURONTIN) 300 MG capsule, Take 1 capsule (300 mg total) by mouth 2 (two) times daily. (Patient taking  differently: Take 400 mg by mouth 2 (two) times daily.)   magnesium hydroxide (MILK OF MAGNESIA) 400 MG/5ML suspension, Take by mouth daily as needed for mild constipation.   pantoprazole (PROTONIX) 40 MG tablet, Take 40 mg by mouth in the morning.   RESTASIS 0.05 % ophthalmic emulsion, Place 1 drop into both eyes 2 (two) times daily.   topiramate (TOPAMAX) 50 MG tablet, Take 50 mg by mouth every evening.   vitamin E 180 MG (400 UNITS) capsule, Take 400 Units by mouth in the morning.    ED Course: Pt in Ed *** Vitals:   08/07/22 1441 08/07/22 1449 08/07/22 2134  BP:  (!) 181/91 (!) 183/76  Pulse:  70 79  Resp:  15 16  Temp:  97.7 F (36.5 C)   TempSrc:  Oral   SpO2:  100% 98%  Weight: 45.9 kg    Height: '5\' 2"'$  (1.575 m)     No intake/output data recorded. SpO2: 98 % Blood work in ed shows: Results for orders placed or performed during the hospital encounter of 08/07/22 (from the past 48 hour(s))  Protime-INR     Status: None   Collection Time: 08/07/22  2:58 PM  Result Value Ref Range   Prothrombin Time 15.1 11.4 - 15.2 seconds   INR 1.2 0.8 - 1.2    Comment: (NOTE) INR goal varies based on device and disease states. Performed at [facility], [address] [place], [place] [address]   APTT     Status: Abnormal   Collection Time: 08/07/22  2:58 PM  Result Value Ref Range   aPTT 23 (L) 24 - 36 seconds    Comment: Performed at [facility], [address] [place], [place] [address]  CBC     Status: Abnormal   Collection Time: 08/07/22  2:58 PM  Result Value Ref Range   WBC 5.5 4.0 - 10.5 K/uL   RBC 3.61 (L) 3.87 - 5.11 MIL/uL   Hemoglobin 12.0 12.0 - 15.0 g/dL   HCT 35.6 (L) 36.0 - 46.0 %   MCV 98.6 80.0 - 100.0 fL   MCH 33.2 26.0 - 34.0 pg   MCHC 33.7 30.0 - 36.0 g/dL   RDW 11.6 11.5 - 15.5 %   Platelets 121 (L) 150 - 400 K/uL   nRBC 0.0 0.0 - 0.2 %    Comment: Performed at [facility], [address] [place], [place] [address]   Differential     Status: None   Collection Time: 08/07/22  2:58 PM  Result Value Ref Range   Neutrophils Relative % 78 %   Neutro Abs 4.3 1.7 -  7.7 K/uL   Lymphocytes Relative 13 %   Lymphs Abs 0.7 0.7 - 4.0 K/uL   Monocytes Relative 7 %   Monocytes Absolute 0.4 0.1 - 1.0 K/uL   Eosinophils Relative 1 %   Eosinophils Absolute 0.0 0.0 - 0.5 K/uL   Basophils Relative 0 %   Basophils Absolute 0.0 0.0 - 0.1 K/uL   Immature Granulocytes 1 %   Abs Immature Granulocytes 0.03 0.00 - 0.07 K/uL    Comment: Performed at [facility], [address] [place], [place] [address]  Comprehensive metabolic panel     Status: Abnormal   Collection Time: 08/07/22  2:58 PM  Result Value Ref Range   Sodium 135 135 - 145 mmol/L   Potassium 4.0 3.5 - 5.1 mmol/L   Chloride 103 98 - 111 mmol/L   CO2 24 22 - 32 mmol/L   Glucose, Bld 117 (H) 70 - 99 mg/dL    Comment: Glucose reference range applies only to samples taken after fasting for at least 8 hours.   BUN 15 8 - 23 mg/dL   Creatinine, Ser 0.79 0.44 - 1.00 mg/dL   Calcium 9.5 8.9 - 10.3 mg/dL   Total Protein 7.2 6.5 - 8.1 g/dL   Albumin 4.3 3.5 - 5.0 g/dL   AST 25 15 - 41 U/L   ALT 18 0 - 44 U/L   Alkaline Phosphatase 92 38 - 126 U/L   Total Bilirubin 0.6 0.3 - 1.2 mg/dL   GFR, Estimated >60 >60 mL/min    Comment: (NOTE) Calculated using the CKD-EPI Creatinine Equation (2021)    Anion gap 8 5 - 15    Comment: Performed at [facility], [address], [place], [place] [address]  Ethanol     Status: None   Collection Time: 08/07/22  2:58 PM  Result Value Ref Range   Alcohol, Ethyl (B) <10 <10 mg/dL    Comment: (NOTE) Lowest detectable limit for serum alcohol is 10 mg/dL.  For medical purposes only. Performed at [facility], [address] [place], [place] [address]   Resp Panel by RT-PCR (Flu A&B, Covid) Anterior Nasal Swab     Status: None   Collection Time: 08/07/22  2:58 PM   Specimen: Anterior Nasal  Swab  Result Value Ref Range   SARS Coronavirus 2 by RT PCR NEGATIVE NEGATIVE    Comment: (NOTE) SARS-CoV-2 target nucleic acids are NOT DETECTED.  The SARS-CoV-2 RNA is generally detectable in upper respiratory specimens during the acute phase of infection. The lowest concentration of SARS-CoV-2 viral copies this assay can detect is 138 copies/mL. A negative result does not preclude SARS-Cov-2 infection and should not be used as the sole basis for treatment or other patient management decisions. A negative result may occur with  improper specimen collection/handling, submission of specimen other than nasopharyngeal swab, presence of viral mutation(s) within the areas targeted by this assay, and inadequate number of viral copies(<138 copies/mL). A negative result must be combined with clinical observations, patient history, and epidemiological information. The expected result is Negative.  Fact Sheet for Patients:  EntrepreneurPulse.com.au  Fact Sheet for Healthcare Providers:  IncredibleEmployment.be  This test is no t yet approved or cleared by the [place] FDA and  has been authorized for detection and/or diagnosis of SARS-CoV-2 by FDA under an Emergency Use Authorization (EUA). This EUA will remain  in effect (meaning this test can be used) for the duration of the COVID-19 declaration under Section 564(b)(1) of  the Act, 21 U.S.C.section 360bbb-3(b)(1), unless the authorization is terminated  or revoked sooner.       Influenza A by PCR NEGATIVE NEGATIVE   Influenza B by PCR NEGATIVE NEGATIVE    Comment: (NOTE) The Xpert Xpress SARS-CoV-2/FLU/RSV plus assay is intended as an aid in the diagnosis of influenza from Nasopharyngeal swab specimens and should not be used as a sole basis for treatment. Nasal washings and aspirates are unacceptable for Xpert Xpress SARS-CoV-2/FLU/RSV testing.  Fact Sheet for  Patients: EntrepreneurPulse.com.au  Fact Sheet for Healthcare Providers: IncredibleEmployment.be  This test is not yet approved or cleared by the [place] FDA and has been authorized for detection and/or diagnosis of SARS-CoV-2 by FDA under an Emergency Use Authorization (EUA). This EUA will remain in effect (meaning this test can be used) for the duration of the COVID-19 declaration under Section 564(b)(1) of the Act, 21 U.S.C. section 360bbb-3(b)(1), unless the authorization is terminated or revoked.  Performed at [facility], [address] [place], [place] [address]     In Ed pt received  Meds ordered this encounter  Medications   sodium chloride flush (NS) 0.9 % injection 3 mL   gadobutrol (GADAVIST) 1 MMOL/ML injection 4 mL    Unresulted Labs (From admission, onward)    None        Admission Imaging : MR Brain W and Wo Contrast  Result Date: 08/07/2022 CLINICAL DATA:  Dizziness EXAM: MRI HEAD WITHOUT AND WITH CONTRAST TECHNIQUE: Multiplanar, multiecho pulse sequences of the brain and surrounding structures were obtained without and with intravenous contrast. CONTRAST:  2m GADAVIST GADOBUTROL 1 MMOL/ML IV SOLN COMPARISON:  08/25/2019 FINDINGS: Brain: Scattered punctate foci of acute ischemia within both cerebellar hemispheres, left basal ganglia and both frontal lobes. Pattern is most consistent with emboli. No acute or chronic hemorrhage. There is multifocal hyperintense T2-weighted signal within the white matter. Parenchymal volume and CSF spaces are normal. The midline structures are normal. There is no abnormal contrast enhancement. Vascular: Major flow voids are preserved. Skull and upper cervical spine: Normal calvarium and skull base. Visualized upper cervical spine and soft tissues are normal. Sinuses/Orbits:No paranasal sinus fluid levels or advanced mucosal thickening. No mastoid or middle ear effusion. Normal  orbits. IMPRESSION: Scattered punctate foci of acute ischemia within both cerebellar hemispheres, left basal ganglia and both frontal lobes. Pattern is most consistent with emboli, most likely from a cardiac or proximal aortic source. No hemorrhage or mass effect. Electronically Signed   By: KUlyses JarredM.D.   On: 08/07/2022 21:22   CT HEAD WO CONTRAST  Result Date: 08/07/2022 CLINICAL DATA:  Dizziness on Monday followed by vision changes on Wednesday EXAM: CT HEAD WITHOUT CONTRAST TECHNIQUE: Contiguous axial images were obtained from the base of the skull through the vertex without intravenous contrast. RADIATION DOSE REDUCTION: This exam was performed according to the departmental dose-optimization program which includes automated exposure control, adjustment of the mA and/or kV according to patient size and/or use of iterative reconstruction technique. COMPARISON:  Brain MRI 08/25/2019 FINDINGS: Brain: There is no acute intracranial hemorrhage, extra-axial fluid collection, or acute territorial infarct. Parenchymal volume is normal for age. The ventricles are normal in size. Gray-white differentiation is preserved. There is a small remote lacunar infarct in the left caudate head, new since the prior MRI from 2020. There is no mass lesion.  There is no mass effect or midline shift. Vascular: No hyperdense vessel or unexpected calcification. Skull: Normal. Negative for fracture or focal lesion.  Sinuses/Orbits: The imaged paranasal sinuses are clear. Bilateral lens implants are in place. The globes and orbits are otherwise unremarkable. Other: None. IMPRESSION: 1. No acute intracranial pathology. 2. Small infarct in the left caudate head is new since the brain MRI from 2020 but is remote in appearance. Otherwise, unremarkable for age head CT. Electronically Signed   By: Valetta Mole M.D.   On: 08/07/2022 16:52   CT CHEST ABDOMEN PELVIS W CONTRAST  Result Date: 08/07/2022 CLINICAL DATA:  History of  endometrial cancer, rising CA 125. * Tracking Code: BO * EXAM: CT CHEST, ABDOMEN, AND PELVIS WITH CONTRAST TECHNIQUE: Multidetector CT imaging of the chest, abdomen and pelvis was performed following the standard protocol during bolus administration of intravenous contrast. RADIATION DOSE REDUCTION: This exam was performed according to the departmental dose-optimization program which includes automated exposure control, adjustment of the mA and/or kV according to patient size and/or use of iterative reconstruction technique. CONTRAST:  68m OMNIPAQUE IOHEXOL 300 MG/ML  SOLN COMPARISON:  CT December 11, 2021. FINDINGS: CT CHEST FINDINGS Cardiovascular: Aortic atherosclerosis. Normal caliber thoracic aorta. No central pulmonary embolus on this nondedicated study. Coronary artery calcifications. Normal size heart. No significant pericardial effusion/thickening. Mediastinum/Nodes: No supraclavicular adenopathy. No discrete thyroid nodule. No pathologically enlarged mediastinal, hilar or axillary lymph nodes. Unchanged calcified nodal tissue in the subcarinal space. The esophagus is grossly unremarkable. Lungs/Pleura: Biapical pleuroparenchymal scarring. Bronchiectasis with peripheral airway impaction noted in the right lower lobe on image 93/5 with adjacent tree-in-bud nodules. Additional tree-in-bud nodularity in the right middle lobe, lingula and scattered throughout the right lower lobe. Similar ground-glass opacities in the left lower lobe for instance on image 111/5 measuring 7 mm and 95/5 measuring 5 mm, unchanged. No new suspicious pulmonary nodules or masses. No pleural effusion. No pneumothorax. Musculoskeletal: Biopsy clip in the right breast. No aggressive lytic or blastic lesion of bone. Multilevel degenerative changes spine. Diffuse demineralization of bone. CT ABDOMEN PELVIS FINDINGS Hepatobiliary: No suspicious hepatic lesion. Gallbladder is unremarkable. Common bile duct measures 5 mm, unchanged  Pancreas: No pancreatic ductal dilation or evidence of acute inflammation. Spleen: Calcified hepatic granulomata. Adrenals/Urinary Tract: Bilateral adrenal glands appear normal. No hydronephrosis. Kidneys demonstrate symmetric enhancement and excretion of contrast material. Urinary bladder is unremarkable for degree of distension Stomach/Bowel: Radiopaque enteric contrast material traverses the hepatic flexure. Stomach is unremarkable for degree of distension. No pathologic dilation of small or large bowel. Gas fluid levels throughout the colon suggestive of diarrheal illness. Vascular/Lymphatic: Aortic atherosclerosis. No pathologically enlarged abdominal or pelvic lymph nodes. Reproductive: Uterus is surgically absent, no soft tissue nodularity along the vaginal cuff. No suspicious adnexal mass Other: Mild mesenteric edema with some heterogeneity of the omental fat, similar prior. Trace fluid in the pelvis for instance a focal area of fluid in the posterior left hemipelvis on image 111/2. Mild pelvic peritoneal thickening. No discrete peritoneal or omental nodularity. Musculoskeletal: No aggressive lytic or blastic lesion of bone. Degenerative grade 1 L4 on L5 anterolisthesis. Multilevel degenerative changes spine. Diffuse demineralization of bone. IMPRESSION: 1. Status post hysterectomy without soft tissue nodularity along the vaginal cuff. 2. Trace pelvic free fluid and pelvic peritoneal thickening with similar mild mesenteric edema and heterogeneity of the omental fat. No discrete peritoneal or omental nodularity. 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 Consider attention on short-term interval follow-up imaging. 3. Bronchiectasis with peripheral airway impaction in the right lower lobe and adjacent tree-in-bud nodules as well as additional tree-in-bud nodularity in the right middle lobe, lingula and  scattered throughout the right lower lobe. Findings are favored to reflect an  atypical infectious or inflammatory process such as atypical mycobacterial infection. 4. Stable ground-glass opacities in the left lower lobe measuring up to 7 mm, nonspecific possibly reflecting an infectious or inflammatory process. Attention on follow-up imaging suggested. 5. Gas fluid levels throughout the colon as can be seen with diarrheal illness 6.  Aortic Atherosclerosis (ICD10-I70.0). Electronically Signed   By: Dahlia Bailiff M.D.   On: 08/07/2022 12:11      Physical Examination: Vitals:   08/07/22 1441 08/07/22 1449 08/07/22 2134  BP:  (!) 181/91 (!) 183/76  Pulse:  70 79  Temp:  97.7 F (36.5 C)   Resp:  15 16  Height: '5\' 2"'$  (1.575 m)    Weight: 45.9 kg    SpO2:  100% 98%  TempSrc:  Oral   BMI (Calculated): 18.5     Physical Exam     Assessment and Plan: No notes have been filed under this hospital service. Service: Hospitalist          DVT prophylaxis:  ***   Code Status:  ***   Family Communication:  ***   Disposition Plan:  ***   Consults called:  ***  Admission status: ***   Unit/ Expected LOS: ***   Para Skeans MD [facility]  6 PM- 2 AM. Please contact me via secure Chat 6 PM-2 AM. [phone] ( Pager ) To contact the [facility] Attending or Consulting provider [place] or covering provider during after hours Melissa, for this patient.   Check the care team in [facility] and look for a) attending/consulting [facility] provider listed and b) the [facility] team listed Log into [email or web address] and use [place]'s universal password to access. If you do not have the password, please contact the hospital operator. Locate the [facility] provider you are looking for under [facility] and page to a number that you can be directly reached. If you still have difficulty reaching the provider, please page the [facility] (Director on Call) for the Hospitalists listed on amion for assistance. [email or web address] 08/07/2022, 10:04 PM

## 2022-08-07 NOTE — ED Triage Notes (Signed)
C?O spurts of dizziness on Monday.  Then Wednesday evening, reports a vision change where visual fields "just kind of came together making it difficult to see".    This morning patient was sitting on floor because of dizziness.  A 125 marker elevated, Oncology ordered a CT scan of chest - done today.  Patient states feeling poorly today, dizziness worse, difficulty walking due to dizziness and nausea.  States woke up feeling worse today.   Patient also feels that her thoughts are slow and having trouble focusing.

## 2022-08-07 NOTE — Telephone Encounter (Signed)
Called and spoke to Ms. Hewlett and her son, [name]. She reports she feels horrible. Her speech is somewhat slurred and slow. She is reporting changes with her vision, blurry, feelings of having a TIA, nausea, dizziness and noted difficulty gathering thoughts. States she has never had an episode of vertigo like this and no longer thinks it is vertigo. Voicing some anger. I have encouraged her to go to the ED for evaluation. She is hesitant to go but after talking and reviewing all of her symptoms with [name], he is taking her.  Did briefly go over results of CT and Dr. Janese Banks will repeat that in 3 months.

## 2022-08-07 NOTE — Telephone Encounter (Signed)
Elizabeth Friedman has arrived at her home and they are now coming in for CT scan. MRI will be tomorrow.

## 2022-08-07 NOTE — ED Provider Triage Note (Signed)
Emergency Medicine Provider Triage Evaluation Note  Elizabeth Friedman , a 81 y.o. female  was evaluated in triage.  Pt complains of dizziness, weakness, hx of uterine and breast ca, currently under treatment, afraid she had a tia this morning, retired Therapist, sports.  Review of Systems  Positive:  Negative:   Physical Exam  Ht '5\' 2"'$  (1.575 m)   Wt 45.9 kg   BMI 18.51 kg/m  Gen:   Awake, no distress   Resp:  Normal effort  MSK:   Moves extremities without difficulty  Other:    Medical Decision Making  Medically screening exam initiated at 2:46 PM.  Appropriate orders placed.  MAYLYNN ORZECHOWSKI was informed that the remainder of the evaluation will be completed by another provider, this initial triage assessment does not replace that evaluation, and the importance of remaining in the ED until their evaluation is complete.  Stroke protocols, covid test ordered   Versie Starks, PA-C 08/07/22 1447

## 2022-08-07 NOTE — Progress Notes (Signed)
Called to check on Elizabeth Friedman since she was not feeling well. Per Dr. Janese Banks her CA 125 is rising and she will need a CT CAP. Spoke to Elizabeth Friedman. Upon answering her TV was very loud and it was hard to hear her. She states she got out of bed and was so dizzy she had to sit down on the floor and is to dizzy to get up right now. She denied falling. She cannot reach the TV remote to turn it down. Notified Dr. Janese Banks and she would like to get MRI brain done now. I called and spoke to Elizabeth Friedman, with Elizabeth Friedman permission, and made him aware of the situation. He will go to her home and assist her up and bring her to the scans. We have ordered them for today in the hopes she can have them performed.

## 2022-08-08 ENCOUNTER — Other Ambulatory Visit: Payer: Self-pay

## 2022-08-08 ENCOUNTER — Ambulatory Visit: Admission: RE | Admit: 2022-08-08 | Payer: PPO | Source: Ambulatory Visit

## 2022-08-08 ENCOUNTER — Inpatient Hospital Stay: Payer: PPO

## 2022-08-08 ENCOUNTER — Other Ambulatory Visit: Payer: PPO

## 2022-08-08 DIAGNOSIS — G459 Transient cerebral ischemic attack, unspecified: Secondary | ICD-10-CM

## 2022-08-08 DIAGNOSIS — Z8719 Personal history of other diseases of the digestive system: Secondary | ICD-10-CM

## 2022-08-08 DIAGNOSIS — I639 Cerebral infarction, unspecified: Secondary | ICD-10-CM

## 2022-08-08 DIAGNOSIS — R42 Dizziness and giddiness: Secondary | ICD-10-CM | POA: Diagnosis not present

## 2022-08-08 DIAGNOSIS — R7989 Other specified abnormal findings of blood chemistry: Secondary | ICD-10-CM | POA: Diagnosis present

## 2022-08-08 LAB — TROPONIN I (HIGH SENSITIVITY)
Troponin I (High Sensitivity): 408 ng/L (ref <18)
Troponin I (High Sensitivity): 669 ng/L (ref <18)
Troponin I (High Sensitivity): 748 ng/L (ref <18)
Troponin I (High Sensitivity): 849 ng/L (ref <18)
Troponin I (High Sensitivity): 877 ng/L (ref <18)

## 2022-08-08 LAB — LIPID PANEL
Cholesterol: 171 mg/dL (ref 0–200)
HDL: 45 mg/dL (ref >40)
LDL Cholesterol: 94 mg/dL (ref 0–99)
Total CHOL/HDL Ratio: 3.8 RATIO
Triglycerides: 162 mg/dL — ABNORMAL HIGH (ref <150)
VLDL: 32 mg/dL (ref 0–40)

## 2022-08-08 LAB — HEMOGLOBIN A1C
Hgb A1c MFr Bld: 5.7 % — ABNORMAL HIGH (ref 4.8–5.6)
Mean Plasma Glucose: 116.89 mg/dL

## 2022-08-08 LAB — BRAIN NATRIURETIC PEPTIDE: B Natriuretic Peptide: 94.9 pg/mL (ref 0.0–100.0)

## 2022-08-08 LAB — TYPE AND SCREEN
ABO/RH(D): O POS
Antibody Screen: NEGATIVE

## 2022-08-08 MED ORDER — IOHEXOL 350 MG/ML SOLN
75.0000 mL | Freq: Once | INTRAVENOUS | Status: AC | PRN
  Administered 2022-08-08: 75 mL via INTRAVENOUS

## 2022-08-08 MED ORDER — HYDRALAZINE HCL 20 MG/ML IJ SOLN
10.0000 mg | Freq: Four times a day (QID) | INTRAMUSCULAR | Status: DC | PRN
  Administered 2022-08-08: 10 mg via INTRAVENOUS
  Filled 2022-08-08: qty 1

## 2022-08-08 MED ORDER — ASPIRIN 81 MG PO CHEW
81.0000 mg | CHEWABLE_TABLET | Freq: Every day | ORAL | Status: DC
  Administered 2022-08-08 – 2022-08-10 (×3): 81 mg via ORAL
  Filled 2022-08-08 (×3): qty 1

## 2022-08-08 MED ORDER — ATORVASTATIN CALCIUM 20 MG PO TABS
20.0000 mg | ORAL_TABLET | Freq: Every day | ORAL | Status: DC
  Administered 2022-08-08 – 2022-08-10 (×3): 20 mg via ORAL
  Filled 2022-08-08 (×3): qty 1

## 2022-08-08 MED ORDER — CLOPIDOGREL BISULFATE 75 MG PO TABS
75.0000 mg | ORAL_TABLET | Freq: Every day | ORAL | Status: DC
  Administered 2022-08-08 – 2022-08-10 (×3): 75 mg via ORAL
  Filled 2022-08-08 (×3): qty 1

## 2022-08-08 MED ORDER — NITROGLYCERIN 0.4 MG SL SUBL: 0.4000 mg | SUBLINGUAL_TABLET | SUBLINGUAL | Status: DC | PRN

## 2022-08-08 MED ORDER — MAGNESIUM HYDROXIDE 400 MG/5ML PO SUSP
30.0000 mL | Freq: Every evening | ORAL | Status: DC | PRN
  Administered 2022-08-08 – 2022-08-09 (×2): 30 mL via ORAL
  Filled 2022-08-08 (×2): qty 30

## 2022-08-08 MED ORDER — TECHNETIUM TO 99M ALBUMIN AGGREGATED
4.0800 | Freq: Once | INTRAVENOUS | Status: AC | PRN
  Administered 2022-08-08: 4.08 via INTRAVENOUS

## 2022-08-08 NOTE — Assessment & Plan Note (Signed)
Pt has been taken off antiplatelet therapy by GI due to UGI bleeding history.  I have d/w pt risk and benefit of anticoagulation should she need it for PE or ACS.  We will start IV PPI.

## 2022-08-08 NOTE — Assessment & Plan Note (Signed)
    Latest Ref Rng & Units 08/07/2022    2:58 PM 08/05/2022   10:42 AM 05/05/2022    8:02 AM  CBC  WBC 4.0 - 10.5 K/uL 5.5  4.7  4.5   Hemoglobin 12.0 - 15.0 g/dL 12.0  12.2  10.1   Hematocrit 36.0 - 46.0 % 35.6  34.5  29.8   Platelets 150 - 400 K/uL 121  109  227    Stable and we will follow. If TNI goes higher we will d/w family about anticoagulation and risk of

## 2022-08-08 NOTE — Progress Notes (Signed)
PT Cancellation Note  Patient Details Name: Elizabeth Friedman MRN: [id number] DOB: [birth year]   Cancelled Treatment:    Reason Eval/Treat Not Completed: Medical issues which prohibited therapy;Patient at procedure or test/unavailable Pt with continues up-trending troponins.  Out of room for testing this  AM, spoke with care team - apparently weak but did mobilize to toilet earlier today.  Will maintain on caseload and attempt to evaluate when appropriate.    Kreg Shropshire, DPT 08/08/2022, 9:29 AM

## 2022-08-08 NOTE — ED Notes (Signed)
Elizabeth Friedman notified over telephone of pt critical troponin value of 408

## 2022-08-08 NOTE — Progress Notes (Signed)
OT Cancellation Note  Patient Details Name: Elizabeth Friedman MRN: [id number] DOB: [birth year]   Cancelled Treatment:    Reason Eval/Treat Not Completed: Medical issues which prohibited therapy;Patient at procedure or test/ unavailable. OT orders received, chart reviewed. Pt with elevated troponin (849). Pt currently out of the room for testing. Was able to complete functional mobility to the bathroom with nursing this morning. Will re-attempt evaluation as medically appropriate.   Doneta Public 08/08/2022, 9:32 AM

## 2022-08-08 NOTE — Progress Notes (Signed)
[facility]  PROGRESS NOTE  TERI LEGACY [id number]:[id number] DOB: Feb 23, 1941 DOA: 08/07/2022 PCP: Adin Hector, MD   Brief HPI:   81 year old female with medical history of breast and uterine cancer s/p chemotherapy 1 month ago, completed radiation therapy came to ED with complaints of sudden onset of dizziness that spontaneously resolved. She has been having intermittent episodes of dizziness over the past few weeks.  She also had transient episode of right arm weakness which completely resolved. She had a CT chest abdomen/pelvis done as outpatient ordered by oncology. CT head was unremarkable MRI brain showed scattered punctate foci of acute ischemia within the both cerebellar hemisphere likely consistent with emboli likely from cardiac or proximal aortic source.    Subjective   Patient seen and examined, denies chest pain.  Dizziness has improved.   Assessment/Plan:     Stroke -MRI brain shows scattered punctate foci of acute ischemia within the both cerebellar hemispheres -We will obtain echocardiogram -Neurology has been consulted, lwill need CTA head and neck -Patient is currently on aspirin, atorvastatin -Check hemoglobin A1c,  -Lipid profile showed LDL 94, increase the dose of atorvastatin to 40 mg daily with a goal LDL less than 70 -PT/OT consult Permissive hypertension, treat for BP greater than 220/120  NSTEMI -Patient has significantly elevated troponin 748,877, 849 -EKG showed ST depression in leads II, III, aVF, T wave inversions in V2 V3 and V4 -Cardiology has been consulted -As she had a stroke as above, will await neurology recommendation regarding full dose anticoagulation  History of GI bleed -Patient was taken off antiplatelet therapy by GI due to GI bleeding -Continue Protonix 40 mg daily  History of breast/uterine cancer -S/p chemotherapy and radiation treatment -Followed by oncology as outpatient    Medications      stroke: early  stages of recovery book   Does not apply Once   aspirin  81 mg Oral Daily   atorvastatin  20 mg Oral Daily   cholecalciferol  5,000 Units Oral q AM   ferrous [id number] C-folic acid  1 capsule Oral q AM   gabapentin  300 mg Oral BID   pantoprazole  40 mg Oral q AM   sodium chloride flush  3 mL Intravenous Once   topiramate  50 mg Oral QPM     Data Reviewed:   CBG:  No results for input(s): "GLUCAP" in the last 168 hours.  SpO2: 98 %    Vitals:   08/08/22 0700 08/08/22 0715 08/08/22 0741 08/08/22 0745  BP: (!) 168/98  (!) 165/89   Pulse: 74 75 74   Resp: 17 (!) 26 (!) 24   Temp:    98.2 F (36.8 C)  TempSrc:    Oral  SpO2: 99% 98% 98%   Weight:      Height:          Data Reviewed:  Basic Metabolic Panel: Recent Labs  Lab 08/05/22 1042 08/07/22 1458  NA 130* 135  K 4.3 4.0  CL 99 103  CO2 25 24  GLUCOSE 96 117*  BUN 14 15  CREATININE 0.92 0.79  CALCIUM 8.9 9.5    CBC: Recent Labs  Lab 08/05/22 1042 08/07/22 1458  WBC 4.7 5.5  NEUTROABS 3.3 4.3  HGB 12.2 12.0  HCT 34.5* 35.6*  MCV 96.6 98.6  PLT 109* 121*    LFT Recent Labs  Lab 08/05/22 1042 08/07/22 1458  AST 27 25  ALT 20 18  ALKPHOS 99 92  BILITOT  0.4 0.6  PROT 7.0 7.2  ALBUMIN 4.2 4.3     Antibiotics: Anti-infectives (From admission, onward)    None        DVT prophylaxis: SCDs  Code Status: Full code  Family Communication: Discussed with patient's son at bedside   CONSULTS neurology, cardiology   Objective    Physical Examination:   General-appears in no acute distress Heart-S1-S2, regular, no murmur auscultated Lungs-clear to auscultation bilaterally, no wheezing or crackles auscultated Abdomen-soft, nontender, no organomegaly Extremities-no edema in the lower extremities Neuro-alert, oriented x3, no focal deficit noted  Status is: Inpatient:          Oswald Hillock   [facility] If 7PM-7AM, please contact night-coverage at  [email or web address], Office  [phone]   08/08/2022, 8:10 AM  LOS: 1 day

## 2022-08-08 NOTE — Progress Notes (Signed)
   08/08/22 0900  SLP Visit Information  SLP Received On 08/08/22  Reason Eval/Treat Not Completed Patient at procedure or test/unavailable   SLP cognitive-linguistic evaluation orders received; pt off floor for testing. SLP to follow up when appropriate for assessment. Note: Per documentation, patient passed [place] yet remains NPO. D/w nsg, will message MD to request diet. If swallowing concerns, please place SLP swallow evaluation orders.  Deneise Lever, [place], Actor Office: [phone] [place]: [phone]

## 2022-08-08 NOTE — ED Notes (Signed)
Pt able to swallow without difficulty

## 2022-08-08 NOTE — Assessment & Plan Note (Signed)
Differentials include cardiac ischemia NSTEMI, pulmonary embolism. Patient clinically does not have any chest symptoms of chest pain shortness of breath chest pressure chest tightness radiation diaphoresis shortness of breath palpitation. ekg shows :  EKG shows sinus rhythm 88, PR of 160, ST depression noted in 2 3 aVF, T wave inversion in V2 V3 and V4, this is SAME as previous EKG. plz see below.

## 2022-08-08 NOTE — Evaluation (Addendum)
Occupational Therapy Evaluation Patient Details Name: Elizabeth Friedman MRN: [id number] DOB: December 15, 1940 Today's Date: 08/08/2022   History of Present Illness Elizabeth Friedman is a 81 y.o. female with past medical history of breast and uterine cancer with chemotherapy over 1 month ago, finished radiation therapy recently who presents with several discrete episodes of sudden onset dizziness that spontaneously resolves after a few seconds. This is dating back several weeks increasing in frequency. She also had a transient episode of right arm weakness on Wednesday that has now completely resolved. MRI brain: Scattered punctate foci of acute ischemia within both cerebellar  hemispheres, left basal ganglia and both frontal lobes. Pattern is  most consistent with emboli, most likely from a cardiac or proximal aortic source. No hemorrhage or mass effect.   Clinical Impression   Patient seen for OT evaluation, son present. Patient presenting with dizziness, blurry vision, decreased strength, endurance, and impaired balance. At baseline, pt is independent in ADLs, IADLs, and functional mobility without an AD. Pt lives alone, but has a son nearby who is able to provide assistance as needed upon discharge. Patient currently functioning at supervision for bed mobility, Min guard for simulated toilet transfer, and Min guard for functional mobility at room level with Min guard using RW. Anticipate CGA-Min A for standing ADL tasks. Patient will benefit from acute OT to increase overall independence in the areas of ADLs, functional mobility in order to safely discharge home. Pt could benefit from [facility] following D/C to decrease falls risk, improve balance, and maximize independence in self-care within own home environment.     Recommendations for follow up therapy are one component of a multi-disciplinary discharge planning process, led by the attending physician.  Recommendations may be updated based on patient status,  additional functional criteria and insurance authorization.   Follow Up Recommendations  Home health OT    Assistance Recommended at Discharge Set Up Supervision/Assistance  Patient can return home with the following A little help with walking and/or transfers;A little help with bathing/dressing/bathroom;Assistance with cooking/housework;Assist for transportation;Help with stairs or ramp for entrance    Functional Status Assessment  Patient has had a recent decline in their functional status and demonstrates the ability to make significant improvements in function in a reasonable and predictable amount of time.  Equipment Recommendations  BSC/3in1    Recommendations for Other Services       Precautions / Restrictions Precautions Precautions: Fall Restrictions Weight Bearing Restrictions: No      Mobility Bed Mobility Overal bed mobility: Needs Assistance Bed Mobility: Supine to Sit, Sit to Supine     Supine to sit: Supervision, HOB elevated Sit to supine: Supervision, HOB elevated        Transfers Overall transfer level: Needs assistance Equipment used: Rolling walker (2 wheels) Transfers: Sit to/from Stand Sit to Stand: Min guard                  Balance Overall balance assessment: Needs assistance Sitting-balance support: Feet supported Sitting balance-Leahy Scale: Good     Standing balance support: Bilateral upper extremity supported Standing balance-Leahy Scale: Fair          General comments: Pt endorsing "neuropathy" in B hands and feet at baseline                   ADL either performed or assessed with clinical judgement   ADL Overall ADL's : Needs assistance/impaired     Grooming: Set up;Supervision/safety;Sitting  Toilet Transfer: Rolling walker (2 wheels);Min Psychiatric nurse Details (indicate cue type and reason): simulated with STS from EOB         Functional mobility during ADLs: Min  guard;Rolling walker (2 wheels) General ADL Comments: anticipate CGA-Min A for standing ADL tasks     Vision Baseline Vision/History: 1 Wears glasses (reading only) Patient Visual Report: Blurring of vision Vision Assessment?: Yes Eye Alignment: Within Functional Limits Ocular Range of Motion: Within Functional Limits Alignment/Gaze Preference: Within Defined Limits Tracking/Visual Pursuits: Requires cues, head turns, or add eye shifts to track (several instances of eye jumping noted) Saccades: Within functional limits Depth Perception: Overshoots (finger to nose grossly WFL, slight overshooting with L hand when going to touch her nose) Additional Comments: Pt denied dipolopia or double vision at beginning of session, however, reported having mild dizziness when turning head to L or R to look at something.Pt reporting previous cataract surgery and that bright lights bother her at baseline      Perception     Praxis      Pertinent Vitals/Pain Pain Assessment Pain Assessment: No/denies pain     Hand Dominance     Extremity/Trunk Assessment Upper Extremity Assessment Upper Extremity Assessment: Generalized weakness (BUE ROM WFL, grip strength good)   Lower Extremity Assessment Lower Extremity Assessment: Generalized weakness   Cervical / Trunk Assessment Cervical / Trunk Assessment: Normal   Communication Communication Communication: No difficulties   Cognition Arousal/Alertness: Awake/alert Behavior During Therapy: WFL for tasks assessed/performed Overall Cognitive Status: Within Functional Limits for tasks assessed                                 General Comments: Able to give PLOF/history, motivated, stating several times "I'll try, but I just don't feel good"     General Comments       Exercises Other Exercises Other Exercises: OT provided education re: role of OT, OT POC, post acute recs, sitting up for all meals, EOB/OOB mobility with assistance,  home/fall safety.     Shoulder Instructions      Home Living Family/patient expects to be discharged to:: Private residence Living Arrangements: Alone Available Help at Discharge: Family;Available PRN/intermittently Type of Home: House       Home Layout: One level     Bathroom Shower/Tub: Tub/shower unit;Walk-in shower (mainly takes baths in tub, walk in shower has built in seat)   Biochemist, clinical: Standard     Home Equipment: Shower seat - built in   Additional Comments: Son lives ~20 min away      Prior Functioning/Environment Prior Level of Function : Independent/Modified Independent;Driving             Mobility Comments: independent for household and community distances with no AD, denies history of falls ADLs Comments: independent, driving, "very active"        OT Problem List: Decreased strength;Decreased knowledge of use of DME or AE;Decreased activity tolerance;Impaired balance (sitting and/or standing)      OT Treatment/Interventions: Self-care/ADL training;Patient/family education;Therapeutic exercise;Balance training;Energy conservation;Therapeutic activities;DME and/or AE instruction    OT Goals(Current goals can be found in the care plan section) Acute Rehab OT Goals Patient Stated Goal: get stronger and return to PLOF OT Goal Formulation: With patient/family Time For Goal Achievement: 08/22/22 Potential to Achieve Goals: Good  OT Frequency: Min 2X/week    Co-evaluation  AM-PAC OT "6 Clicks" Daily Activity     Outcome Measure Help from another person eating meals?: None Help from another person taking care of personal grooming?: A Little Help from another person toileting, which includes using toliet, bedpan, or urinal?: A Little Help from another person bathing (including washing, rinsing, drying)?: A Little Help from another person to put on and taking off regular upper body clothing?: A Little Help from another person to  put on and taking off regular lower body clothing?: A Little 6 Click Score: 19   End of Session Equipment Utilized During Treatment: Gait belt;Rolling walker (2 wheels) Nurse Communication: Mobility status  Activity Tolerance: Patient tolerated treatment well;Patient limited by fatigue Patient left: in bed;with call bell/phone within reach;with family/visitor present  OT Visit Diagnosis: Unsteadiness on feet (R26.81);Muscle weakness (generalized) (M62.81);Dizziness and giddiness (R42)                Time: 1115-5208 OT Time Calculation (min): 30 min Charges:  OT General Charges $OT Visit: 1 Visit OT Evaluation $OT Eval Low Complexity: 1 Low  [facility] MS, OTR/L ascom [phone]  08/08/22, 1:27 PM

## 2022-08-08 NOTE — Assessment & Plan Note (Signed)
Vitals:   08/07/22 1449 08/07/22 2134 08/08/22 0033  BP: (!) 181/91 (!) 183/76 (!) 180/91  D/D include PE / CVA. We will obtain V/Q scan.  We will request cardiology consult for ischemic eval plan. Troponin is rising  To 600 and pt has no new ekg changes.  We will follow.

## 2022-08-08 NOTE — Progress Notes (Signed)
       CROSS COVER NOTE  NAME: Elizabeth Friedman MRN: [id number] DOB : June 02, 1941    Date of Service   08/08/2022  HPI/Events of Note   Patient admitted with profound dizziness and falls. MRI showed Scattered punctate foci of acute ischemia within both cerebellar hemispheres, left basal ganglia and both frontal lobes. Pattern is most consistent with emboli, most likely from a cardiac or proximal aortic source. No hemorrhage or mass effect.  Troponins elevated. Latest reported to me 748 from 669.   Assessment and  Interventions   No hest pain or SOB VQ and echo lready pending Repeat ekg without changes to prior - no STE   - Repeat trop with am labs  -continue monitoring on tele

## 2022-08-08 NOTE — Consult Note (Signed)
CARDIOLOGY CONSULT NOTE               Patient ID: Elizabeth Friedman MRN: [id number] DOB/AGE: 82/17/1942 82 y.o.  Admit date: 08/07/2022 Referring Physician Dr Eleonore Chiquito Primary Physician Ramonita Lab MD Primary Cardiologist Dr Jordan Hawks Reason for Consultation CVA/Elevated troponin/AMS  HPI: Patient is a 81 year old female presents with altered mental status vertigo dizziness found to have multiple CVAs in the frontal lobe on MRI.  Patient seen by neurology concern for embolic phenomenon patient was found to have elevated troponins but no cardiac symptoms she has had altered mental status vertigo weakness fatigue previous history of breast and uterine cancer status post chemo now with generalized weakness intermittent confusion.  Cardiology consultation was then recommended for cardiac evaluation evaluate for possible arrhythmia and/or ACS because of elevated troponins.  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[place])    Ductal carcinoma in situ (DCIS) of right breast    Fibrocystic breast disease    GERD (gastroesophageal reflux disease)    Hearing aid worn    bilateral   History of hiatal hernia    Hyperlipidemia    Lung nodule    Menopause    Migraine    Migraine    optical migraines   Motion sickness    Plantar fasciitis    Prolapse of female pelvic organs    hodge pessary   Raynaud's disease    Sciatic leg pain    Sciatica    Skin cancer of face    Urinary retention with incomplete bladder emptying    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   Vitreous detachment of both eyes     Past Surgical History:  Procedure Laterality Date   APPENDECTOMY  1951   BREAST LUMPECTOMY WITH RADIOACTIVE SEED LOCALIZATION Right 05/29/2020   Procedure: RIGHT BREAST LUMPECTOMY WITH  RADIOACTIVE SEED LOCALIZATION;  Surgeon: Rolm Bookbinder, MD;  Location: [place];  Service: General;  Laterality: Right;   CARDIAC CATHETERIZATION  2004   negative   CATARACT EXTRACTION W/PHACO Right 02/26/2021   Procedure: CATARACT EXTRACTION PHACO AND INTRAOCULAR LENS PLACEMENT ([facility]) RIGHT 5.86 01:19.6 7.4%;  Surgeon: Leandrew Koyanagi, MD;  Location: [place];  Service: Ophthalmology;  Laterality: Right;   CATARACT EXTRACTION W/PHACO Left 09/03/2021   Procedure: CATARACT EXTRACTION PHACO AND INTRAOCULAR LENS PLACEMENT ([place]) LEFT;  Surgeon: Leandrew Koyanagi, MD;  Location: [place];  Service: Ophthalmology;  Laterality: Left;  9.61 01:15.9   COLONOSCOPY     2000, 2006, 2012   COLONOSCOPY WITH PROPOFOL N/A 03/27/2016   Procedure: COLONOSCOPY WITH PROPOFOL;  Surgeon: Manya Silvas, MD;  Location: [facility] ENDOSCOPY;  Service: Endoscopy;  Laterality: N/A;   COLONOSCOPY WITH PROPOFOL N/A 08/14/2020   Procedure: COLONOSCOPY WITH PROPOFOL;  Surgeon: Toledo, Benay Pike, MD;  Location: [facility] ENDOSCOPY;  Service: Gastroenterology;  Laterality: N/A;   CYSTOSCOPY N/A 12/24/2021   Procedure: CYSTOSCOPY;  Surgeon: Jaquita Folds, MD;  Location: [facility] ORS;  Service: Gynecology;  Laterality: N/A;   DILATATION & CURETTAGE/HYSTEROSCOPY WITH MYOSURE N/A 12/01/2021   Procedure: DILATATION & CURETTAGE/HYSTEROSCOPY WITH MYOSURE;  Surgeon: Rubie Maid, MD;  Location: [facility] OR[facility];  Service: Gynecology;  Laterality: N/A;   ESOPHAGOGASTRODUODENOSCOPY  08/1999   ESOPHAGOGASTRODUODENOSCOPY (EGD) WITH PROPOFOL N/A 08/14/2020   Procedure: ESOPHAGOGASTRODUODENOSCOPY (EGD) WITH PROPOFOL;  Surgeon: Toledo, Benay Pike, MD;  Location: [facility] ENDOSCOPY;  Service: Gastroenterology;  Laterality: N/A;   ESOPHAGOGASTRODUODENOSCOPY (EGD) WITH PROPOFOL N/A 03/21/2021   Procedure: ESOPHAGOGASTRODUODENOSCOPY (EGD) WITH PROPOFOL;  Surgeon: Toledo, Benay Pike, MD;  Location: [facility] ENDOSCOPY;   Service: Gastroenterology;  Laterality: N/A;   PORTA CATH INSERTION N/A 01/15/2022   Procedure: PORTA CATH INSERTION;  Surgeon: Algernon Huxley, MD;  Location: [place] CV LAB;  Service: Cardiovascular;  Laterality: N/A;   PORTA CATH REMOVAL N/A 06/15/2022   Procedure: PORTA CATH REMOVAL;  Surgeon: Algernon Huxley, MD;  Location: [place] CV LAB;  Service: Cardiovascular;  Laterality: N/A;   SKIN CANCER EXCISION     face and neck   TOTAL LAPAROSCOPIC HYSTERECTOMY WITH BILATERAL SALPINGO OOPHORECTOMY Bilateral 12/24/2021   Procedure: TOTAL LAPAROSCOPIC HYSTERECTOMY WITH BILATERAL SALPINGO OOPHORECTOMY, PELVIC NODE BIOPSY,  PESSARY REMOVAL (PESSARY REMOVAL BY DR. Wannetta Sender;  Surgeon: Mellody Drown, MD;  Location: [facility] OR[facility];  Service: Gynecology;  Laterality: Bilateral;    (Not in a hospital admission)  Social History   Socioeconomic History   Marital status: Widowed    Spouse name: Not on file   Number of children: Not on file   Years of education: Not on file   Highest education level: Not on file  Occupational History   Not on file  Tobacco Use   Smoking status: Never   Smokeless tobacco: Never  Vaping Use   Vaping Use: Never used  Substance and Sexual Activity   Alcohol use: No   Drug use: No   Sexual activity: Not Currently    Birth control/protection: Post-menopausal  Other Topics Concern   Not on file  Social History Narrative   Lives alone   Social Determinants of Health   Financial Resource Strain: Not on file  Food Insecurity: Not on file  Transportation Needs: Not on file  Physical Activity: Sufficiently Active (12/21/2017)   Exercise Vital Sign    Days of Exercise per Week: 3 days    Minutes of Exercise per Session: 60 min  Stress: Not on file  Social Connections: Not on file  Intimate Partner Violence: Not on file    Family History  Problem Relation Age of Onset   Breast cancer Mother    Heart disease Father    Colon cancer Brother    Diabetes Neg Hx     Ovarian cancer Neg Hx       Review of systems complete and found to be negative unless listed above      PHYSICAL EXAM  General: Well developed, well nourished, in no acute distress HEENT:  Normocephalic and atramatic Neck:  No JVD.  Lungs: Clear bilaterally to auscultation and percussion. Heart: HRRR . Normal S1 and S2 without gallops or murmurs.  Abdomen: Bowel sounds are positive, abdomen soft and non-tender  Msk:  Back normal, normal gait. Normal strength and tone for age. Extremities: No clubbing, cyanosis or edema.   Neuro: Alert and oriented X 3. Psych:  Good affect, responds appropriately  Labs:   Lab Results  Component Value Date   WBC 5.5 08/07/2022   HGB 12.0 08/07/2022   HCT 35.6 (L) 08/07/2022   MCV 98.6 08/07/2022   PLT 121 (L) 08/07/2022    Recent Labs  Lab 08/07/22 1458  NA 135  K 4.0  CL 103  CO2 24  BUN 15  CREATININE 0.79  CALCIUM  9.5  PROT 7.2  BILITOT 0.6  ALKPHOS 92  ALT 18  AST 25  GLUCOSE 117*   No results found for: "CKTOTAL", "CKMB", "CKMBINDEX", "TROPONINI"  Lab Results  Component Value Date   CHOL 171 08/08/2022   Lab Results  Component Value Date   HDL 45 08/08/2022   Lab Results  Component Value Date   LDLCALC 94 08/08/2022   Lab Results  Component Value Date   TRIG 162 (H) 08/08/2022   Lab Results  Component Value Date   CHOLHDL 3.8 08/08/2022   No results found for: "LDLDIRECT"    Radiology: CT ANGIO HEAD NECK W WO CM  Result Date: 08/08/2022 CLINICAL DATA:  Acute neurologic deficit EXAM: CT ANGIOGRAPHY HEAD AND NECK TECHNIQUE: Multidetector CT imaging of the head and neck was performed using the standard protocol during bolus administration of intravenous contrast. Multiplanar CT image reconstructions and MIPs were obtained to evaluate the vascular anatomy. Carotid stenosis measurements (when applicable) are obtained utilizing NASCET criteria, using the distal internal carotid diameter as the denominator.  RADIATION DOSE REDUCTION: This exam was performed according to the departmental dose-optimization program which includes automated exposure control, adjustment of the mA and/or kV according to patient size and/or use of iterative reconstruction technique. CONTRAST:  75m OMNIPAQUE IOHEXOL 350 MG/ML SOLN COMPARISON:  08/07/2022 FINDINGS: CT HEAD FINDINGS Brain: There is no mass, hemorrhage or extra-axial collection. The size and configuration of the ventricles and extra-axial CSF spaces are normal. 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 There is hypoattenuation of the periventricular white matter, most commonly indicating chronic ischemic microangiopathy. Skull: The visualized skull base, calvarium and extracranial soft tissues are normal. Sinuses/Orbits: No fluid levels or advanced mucosal thickening of the visualized paranasal sinuses. No mastoid or middle ear effusion. The orbits are normal. CTA NECK FINDINGS SKELETON: There is no bony spinal canal stenosis. No lytic or blastic lesion. OTHER NECK: Normal pharynx, larynx and major salivary glands. No cervical lymphadenopathy. Unremarkable thyroid gland. UPPER CHEST: Biapical scarring AORTIC ARCH: There is calcific atherosclerosis of the aortic arch. There is no aneurysm, dissection or hemodynamically significant stenosis of the visualized portion of the aorta. Conventional 3 vessel aortic branching pattern. The visualized proximal subclavian arteries are widely patent. RIGHT CAROTID SYSTEM: No dissection, occlusion or aneurysm. Mild atherosclerotic calcification at the carotid bifurcation without hemodynamically significant stenosis. LEFT CAROTID SYSTEM: No dissection, occlusion or aneurysm. Mild atherosclerotic calcification at the carotid bifurcation without hemodynamically significant stenosis. VERTEBRAL ARTERIES: Left dominant configuration. Both origins are clearly patent. There is no dissection, occlusion or flow-limiting stenosis to the skull  base (V1-V3 segments). CTA HEAD FINDINGS POSTERIOR CIRCULATION: --Vertebral arteries: Normal V4 segments. --Inferior cerebellar arteries: Normal. --Basilar artery: Moderate stenosis of the distal basilar artery. --Superior cerebellar arteries: Normal. --Posterior cerebral arteries (PCA): Short segment severe stenosis/occlusion of the left P1 segment with reconstitution. Severe stenosis or short segment occlusion of the proximal left P3 segment. Right PCA is normal. There is a fetal predominant origin of the right PCA and a diminutive left P-comm. ANTERIOR CIRCULATION: --Intracranial internal carotid arteries: Normal. --Anterior cerebral arteries (ACA): Normal. Both A1 segments are present. Patent anterior communicating artery (a-comm). --Middle cerebral arteries (MCA): Normal. VENOUS SINUSES: As permitted by contrast timing, patent. ANATOMIC VARIANTS: Fetal origin of the right posterior cerebral artery. Review of the MIP images confirms the above findings. IMPRESSION: 1. Short segment severe stenosis/occlusion of the left P1 segment with reconstitution. 2. Severe stenosis or short segment occlusion of the proximal left  P3 segment. 3. Moderate stenosis of the distal basilar artery. 4. 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 5. Mild bilateral carotid bifurcation atherosclerosis without hemodynamically significant stenosis. Electronically Signed   By: Ulyses Jarred M.D.   On: 08/08/2022 19:02   US Venous Img Lower Bilateral (DVT)  Result Date: 08/08/2022 CLINICAL DATA:  Embolic stroke. Hypercoagulable. Clinical concern for DVT. EXAM: BILATERAL LOWER EXTREMITY VENOUS DOPPLER ULTRASOUND TECHNIQUE: Gray-scale sonography with compression, as well as color and duplex ultrasound, were performed to evaluate the deep venous system(s) from the level of the common femoral vein through the popliteal and proximal calf veins. COMPARISON:  None Available. FINDINGS: VENOUS Normal compressibility of the common femoral,  superficial femoral, and popliteal veins, as well as the visualized calf veins. Visualized portions of profunda femoral vein and great saphenous vein unremarkable. No filling defects to suggest DVT on grayscale or color Doppler imaging. Doppler waveforms show normal direction of venous flow, normal respiratory plasticity and response to augmentation. Limited views of the contralateral common femoral vein are unremarkable. OTHER None. Limitations: Left peroneal vein not visualized. IMPRESSION: No evidence of a right or left lower extremity deep venous thrombosis. Electronically Signed   By: Lajean Manes M.D.   On: 08/08/2022 18:17   NM Pulmonary Perfusion  Result Date: 08/08/2022 CLINICAL DATA:  Pulmonary embolism suspected. EXAM: NUCLEAR MEDICINE PERFUSION LUNG SCAN TECHNIQUE: Perfusion images were obtained in multiple projections after intravenous injection of radiopharmaceutical. Ventilation scans intentionally deferred if perfusion scan and chest x-ray adequate for interpretation during COVID 19 epidemic. RADIOPHARMACEUTICALS:  4.1 mCi Tc-39mMAA IV COMPARISON:  Chest x-ray earlier same day FINDINGS: Heterogeneous lung perfusion identified in the upper lobes bilaterally and in both lung bases. No definite peripheral wedge-shaped filling defect in either lung. IMPRESSION: Lung perfusion scan indeterminate for acute pulmonary embolus. Heterogeneous perfusion may reflect underlying emphysema. Consider chest CTA to further evaluate if patient renal function permits. Electronically Signed   By: EMisty StanleyM.D.   On: 08/08/2022 10:40   DG Chest Port 1 View  Result Date: 08/08/2022 CLINICAL DATA:  Dyspnea EXAM: PORTABLE CHEST 1 VIEW COMPARISON:  CT 08/07/2022 FINDINGS: The heart size and mediastinal contours are within normal limits. Reticulonodular opacities within the periphery of the right lower lobe, similar to the recent previous CT. No new airspace consolidation. No pleural effusion or pneumothorax.  Calcified granuloma at the left lung base. The visualized skeletal structures are unremarkable. IMPRESSION: Unchanged reticulonodular opacities within the periphery of the right lower lobe. No new airspace consolidation. Electronically Signed   By: NDavina PokeD.O.   On: 08/08/2022 09:13   MR Brain W and Wo Contrast  Result Date: 08/07/2022 CLINICAL DATA:  Dizziness EXAM: MRI HEAD WITHOUT AND WITH CONTRAST TECHNIQUE: Multiplanar, multiecho pulse sequences of the brain and surrounding structures were obtained without and with intravenous contrast. CONTRAST:  424mGADAVIST GADOBUTROL 1 MMOL/ML IV SOLN COMPARISON:  08/25/2019 FINDINGS: Brain: Scattered punctate foci of acute ischemia within both cerebellar hemispheres, left basal ganglia and both frontal lobes. Pattern is most consistent with emboli. No acute or chronic hemorrhage. There is multifocal hyperintense T2-weighted signal within the white matter. Parenchymal volume and CSF spaces are normal. The midline structures are normal. There is no abnormal contrast enhancement. Vascular: Major flow voids are preserved. Skull and upper cervical spine: Normal calvarium and skull base. Visualized upper cervical spine and soft tissues are normal. Sinuses/Orbits:No paranasal sinus fluid levels or advanced mucosal thickening. No mastoid or middle ear effusion.  Normal orbits. IMPRESSION: Scattered punctate foci of acute ischemia within both cerebellar hemispheres, left basal ganglia and both frontal lobes. Pattern is most consistent with emboli, most likely from a cardiac or proximal aortic source. No hemorrhage or mass effect. Electronically Signed   By: Ulyses Jarred M.D.   On: 08/07/2022 21:22   CT HEAD WO CONTRAST  Result Date: 08/07/2022 CLINICAL DATA:  Dizziness on Monday followed by vision changes on Wednesday EXAM: CT HEAD WITHOUT CONTRAST TECHNIQUE: Contiguous axial images were obtained from the base of the skull through the vertex without intravenous  contrast. RADIATION DOSE REDUCTION: This exam was performed according to the departmental dose-optimization program which includes automated exposure control, adjustment of the mA and/or kV according to patient size and/or use of iterative reconstruction technique. COMPARISON:  Brain MRI 08/25/2019 FINDINGS: Brain: There is no acute intracranial hemorrhage, extra-axial fluid collection, or acute territorial infarct. Parenchymal volume is normal for age. The ventricles are normal in size. Gray-white differentiation is preserved. There is a small remote lacunar infarct in the left caudate head, new since the prior MRI from 2020. There is no mass lesion.  There is no mass effect or midline shift. Vascular: No hyperdense vessel or unexpected calcification. Skull: Normal. Negative for fracture or focal lesion. Sinuses/Orbits: The imaged paranasal sinuses are clear. Bilateral lens implants are in place. The globes and orbits are otherwise unremarkable. Other: None. IMPRESSION: 1. No acute intracranial pathology. 2. Small infarct in the left caudate head is new since the brain MRI from 2020 but is remote in appearance. Otherwise, unremarkable for age head CT. Electronically Signed   By: Valetta Mole M.D.   On: 08/07/2022 16:52   CT CHEST ABDOMEN PELVIS W CONTRAST  Result Date: 08/07/2022 CLINICAL DATA:  History of endometrial cancer, rising CA 125. * Tracking Code: BO * EXAM: CT CHEST, ABDOMEN, AND PELVIS WITH CONTRAST TECHNIQUE: Multidetector CT imaging of the chest, abdomen and pelvis was performed following the standard protocol during bolus administration of intravenous contrast. RADIATION DOSE REDUCTION: This exam was performed according to the departmental dose-optimization program which includes automated exposure control, adjustment of the mA and/or kV according to patient size and/or use of iterative reconstruction technique. CONTRAST:  56m OMNIPAQUE IOHEXOL 300 MG/ML  SOLN COMPARISON:  CT December 11, 2021.  FINDINGS: CT CHEST FINDINGS Cardiovascular: Aortic atherosclerosis. Normal caliber thoracic aorta. No central pulmonary embolus on this nondedicated study. Coronary artery calcifications. Normal size heart. No significant pericardial effusion/thickening. Mediastinum/Nodes: No supraclavicular adenopathy. No discrete thyroid nodule. No pathologically enlarged mediastinal, hilar or axillary lymph nodes. Unchanged calcified nodal tissue in the subcarinal space. The esophagus is grossly unremarkable. Lungs/Pleura: Biapical pleuroparenchymal scarring. Bronchiectasis with peripheral airway impaction noted in the right lower lobe on image 93/5 with adjacent tree-in-bud nodules. Additional tree-in-bud nodularity in the right middle lobe, lingula and scattered throughout the right lower lobe. Similar ground-glass opacities in the left lower lobe for instance on image 111/5 measuring 7 mm and 95/5 measuring 5 mm, unchanged. No new suspicious pulmonary nodules or masses. No pleural effusion. No pneumothorax. Musculoskeletal: Biopsy clip in the right breast. No aggressive lytic or blastic lesion of bone. Multilevel degenerative changes spine. Diffuse demineralization of bone. CT ABDOMEN PELVIS FINDINGS Hepatobiliary: No suspicious hepatic lesion. Gallbladder is unremarkable. Common bile duct measures 5 mm, unchanged Pancreas: No pancreatic ductal dilation or evidence of acute inflammation. Spleen: Calcified hepatic granulomata. Adrenals/Urinary Tract: Bilateral adrenal glands appear normal. No hydronephrosis. Kidneys demonstrate symmetric enhancement and excretion of contrast material. Urinary  bladder is unremarkable for degree of distension Stomach/Bowel: Radiopaque enteric contrast material traverses the hepatic flexure. Stomach is unremarkable for degree of distension. No pathologic dilation of small or large bowel. Gas fluid levels throughout the colon suggestive of diarrheal illness. Vascular/Lymphatic: Aortic  atherosclerosis. No pathologically enlarged abdominal or pelvic lymph nodes. Reproductive: Uterus is surgically absent, no soft tissue nodularity along the vaginal cuff. No suspicious adnexal mass Other: Mild mesenteric edema with some heterogeneity of the omental fat, similar prior. Trace fluid in the pelvis for instance a focal area of fluid in the posterior left hemipelvis on image 111/2. Mild pelvic peritoneal thickening. No discrete peritoneal or omental nodularity. Musculoskeletal: No aggressive lytic or blastic lesion of bone. Degenerative grade 1 L4 on L5 anterolisthesis. Multilevel degenerative changes spine. Diffuse demineralization of bone. IMPRESSION: 1. Status post hysterectomy without soft tissue nodularity along the vaginal cuff. 2. Trace pelvic free fluid and pelvic peritoneal thickening with similar mild mesenteric edema and heterogeneity of the omental fat. No discrete peritoneal or omental nodularity. 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 Consider attention on short-term interval follow-up imaging. 3. Bronchiectasis with peripheral airway impaction in the right lower lobe and adjacent tree-in-bud nodules as well as additional tree-in-bud nodularity in the right middle lobe, lingula and scattered throughout the right lower lobe. Findings are favored to reflect an atypical infectious or inflammatory process such as atypical mycobacterial infection. 4. Stable ground-glass opacities in the left lower lobe measuring up to 7 mm, nonspecific possibly reflecting an infectious or inflammatory process. Attention on follow-up imaging suggested. 5. Gas fluid levels throughout the colon as can be seen with diarrheal illness 6.  Aortic Atherosclerosis (ICD10-I70.0). Electronically Signed   By: Dahlia Bailiff M.D.   On: 08/07/2022 12:11    EKG: Normal sinus rhythm nonspecific ST-T wave changes  ASSESSMENT AND PLAN:  CVA Embolic Vertigo AMS GERD PVD HTN Elevated  troponins Breast Cancer Uterine Cancer S/P Chemo . Plan Agree with admit evaluate for embolic CVA Follow-up neurology input management and recommendations Agree with hypertension management and control Statin therapy for hyperlipidemia Echocardiogram for evaluation of valvular structures and thrombus Telemetry possible event monitor for evaluation of atrial fibrillation Elevated troponins suggestive of demand ischemia doubt ACS Do not recommend invasive procedure like cardiac cath at this stage       Signed: Yolonda Kida MD, 08/08/2022, 7:17 PM

## 2022-08-08 NOTE — Evaluation (Signed)
Physical Therapy Evaluation Patient Details Name: Elizabeth Friedman MRN: [id number] DOB: Mar 09, 1941 Today's Date: 08/08/2022  History of Present Illness  Elizabeth Friedman is a 81 y.o. female with past medical history of breast and uterine cancer with chemotherapy over 1 month ago, finished radiation therapy recently who presents with several discrete episodes of sudden onset dizziness that spontaneously resolves after a few seconds. This is dating back several weeks increasing in frequency. She also had a transient episode of right arm weakness on Wednesday that has now completely resolved. MRI brain: Scattered punctate foci of acute ischemia within both cerebellar  hemispheres, left basal ganglia and both frontal lobes. Pattern is  most consistent with emboli, most likely from a cardiac or proximal aortic source. No hemorrhage or mass effect.  Clinical Impression  Pt motivated and eager to see what she could do but generally feeling poorly with slight head ache and some issues with eyes (no double or blurry vision, just slow to focus/adjust).  She is normally very independent and active and does not require ADs, etc but is much slower and more cautious at this time.  She showed good effort with all tasks, long discussion with pt and son about progression of recovery, expectations, limitations per new level of function, etc.  Pt very much hoping to improve enough to be safe at home, family to discuss ability to provide increased assist.  She did not have any overt LOBs, but did show decreased coordination and overall confidence with mobility, balance, gait per her normally active baseline.  If pt is to go home she will need AD, most appropriately 4WW; per progress, however, will recommend STR at this time.      Recommendations for follow up therapy are one component of a multi-disciplinary discharge planning process, led by the attending physician.  Recommendations may be updated based on patient status,  additional functional criteria and insurance authorization.  Follow Up Recommendations Skilled nursing-short term rehab (<3 hours/day) (pt hoping to be able to return home) Can patient physically be transported by private vehicle: Yes    Assistance Recommended at Discharge Frequent or constant Supervision/Assistance  Patient can return home with the following  A little help with walking and/or transfers;A little help with bathing/dressing/bathroom;Assistance with cooking/housework;Assist for transportation;Help with stairs or ramp for entrance    Equipment Recommendations Rollator (4 wheels)  Recommendations for Other Services       Functional Status Assessment Patient has had a recent decline in their functional status and demonstrates the ability to make significant improvements in function in a reasonable and predictable amount of time.     Precautions / Restrictions Precautions Precautions: Fall Restrictions Weight Bearing Restrictions: No      Mobility  Bed Mobility Overal bed mobility: Modified Independent, Needs Assistance Bed Mobility: Supine to Sit, Sit to Supine     Supine to sit: Supervision Sit to supine: Supervision   General bed mobility comments: Pt able to get to/from sitting/supine with relative ease    Transfers Overall transfer level: Needs assistance Equipment used: None, Rolling walker (2 wheels) Transfers: Sit to/from Stand Sit to Stand: Min guard           General transfer comment: able to rise with relative confidence, use of UEs but not overly reliant on UEs once up    Ambulation/Gait Ambulation/Gait assistance: Min guard Gait Distance (Feet): 100 Feet Assistive device: Rolling walker (2 wheels), 1 person hand held assist         General  Gait Details: Pt with slower than baseline, guarded gait with no overt LOBs.  She did appear to have a few episodes of low grade knee bukcling/discoordiantion with weight acceptance but with no LOBs  or excessive UE reliance.  Final 10 ft with single HHA - again slow and guarded w/o overt safety concerns  Stairs            Wheelchair Mobility    Modified Rankin (Stroke Patients Only)       Balance Overall balance assessment: Needs assistance Sitting-balance support: Feet supported Sitting balance-Leahy Scale: Good     Standing balance support: Bilateral upper extremity supported Standing balance-Leahy Scale: Fair Standing balance comment: Pt able to safety maintain static standing w/o UE support and low grade challanges, c/o issues with eyes focusing with changes in head positioning, point of focus, etc                             Pertinent Vitals/Pain Pain Assessment Pain Assessment:  (head/eye pressure but no pain)    Home Living Family/patient expects to be discharged to:: Private residence Living Arrangements: Alone Available Help at Discharge: Family;Available PRN/intermittently Type of Home: House         Home Layout: One level Home Equipment: Shower seat - built in;Hand held shower head Additional Comments: Son lives ~20 min away, daughter ~30    Prior Function Prior Level of Function : Independent/Modified Independent;Driving             Mobility Comments: independent for household and community distances with no AD, denies history of falls ADLs Comments: independent, driving, "very active"     Hand Dominance        Extremity/Trunk Assessment   Upper Extremity Assessment Upper Extremity Assessment: Overall WFL for tasks assessed;Generalized weakness (age appropriate defecits, = b/l)    Lower Extremity Assessment Lower Extremity Assessment: Overall WFL for tasks assessed;Generalized weakness (age appropriate defecits, = b/l)    Cervical / Trunk Assessment Cervical / Trunk Assessment: Normal  Communication   Communication: No difficulties  Cognition Arousal/Alertness: Awake/alert Behavior During Therapy: WFL for tasks  assessed/performed Overall Cognitive Status: Within Functional Limits for tasks assessed                                 General Comments: Able to give PLOF/history, motivated - she did have rare episodes of word finding difficulty but alert and aware of situation        General Comments General comments (skin integrity, edema, etc.): O2 and HR consistent t/o the ambulation effort, BP permissively elevate t/o (roughly 180s/80s)    Exercises     Assessment/Plan    PT Assessment Patient needs continued PT services  PT Problem List Decreased activity tolerance;Decreased balance;Decreased knowledge of use of DME;Decreased safety awareness       PT Treatment Interventions DME instruction;Gait training;Functional mobility training;Therapeutic activities;Stair training;Therapeutic exercise;Balance training;Neuromuscular re-education;Patient/family education    PT Goals (Current goals can be found in the Care Plan section)  Acute Rehab PT Goals Patient Stated Goal: get back to living independently PT Goal Formulation: With patient Time For Goal Achievement: 08/21/22 Potential to Achieve Goals: Good    Frequency 7X/week     Co-evaluation               AM-PAC PT "6 Clicks" Mobility  Outcome Measure Help needed turning from your back to  your side while in a flat bed without using bedrails?: None Help needed moving from lying on your back to sitting on the side of a flat bed without using bedrails?: None Help needed moving to and from a bed to a chair (including a wheelchair)?: A Little Help needed standing up from a chair using your arms (e.g., wheelchair or bedside chair)?: A Little Help needed to walk in hospital room?: A Little Help needed climbing 3-5 steps with a railing? : A Little 6 Click Score: 20    End of Session Equipment Utilized During Treatment: Gait belt Activity Tolerance: Other (comment) (generally feeling poorly, light nausea/head  ache) Patient left: in bed;with call bell/phone within reach;with family/visitor present Nurse Communication: Mobility status PT Visit Diagnosis: Difficulty in walking, not elsewhere classified (R26.2);Other symptoms and signs involving the nervous system (R29.898);Unsteadiness on feet (R26.81);Dizziness and giddiness (R42)    Time: 6195-0932 PT Time Calculation (min) (ACUTE ONLY): 50 min   Charges:   PT Evaluation $PT Eval Moderate Complexity: 1 Mod PT Treatments $Gait Training: 8-22 mins $Therapeutic Activity: 8-22 mins        Kreg Shropshire, DPT 08/08/2022, 3:04 PM

## 2022-08-08 NOTE — Assessment & Plan Note (Signed)
IV PPI. Type and screen .

## 2022-08-08 NOTE — ED Notes (Signed)
Gave report to nuc med, will come to get pt. Around 0930.

## 2022-08-08 NOTE — ED Notes (Signed)
OT at bedside. 

## 2022-08-08 NOTE — ED Notes (Signed)
Ferrous fumarate not available.

## 2022-08-08 NOTE — ED Notes (Signed)
The pt was assisted with ambulating to the toilet without incident. Room cleaned and de cluttered.

## 2022-08-08 NOTE — Consult Note (Signed)
NEUROLOGY CONSULTATION NOTE   Date of service: August 08, 2022 Patient Name: Elizabeth Friedman MRN:  [id number] DOB:  [birth year] Reason for consult: acute ischemic stroke Requesting physician: Dr. Irena Cords _ _ _   _ __   _ __ _ _  __ __   _ __   __ _  History of Present Illness   81 yo woman with hx breast and uterine cancer who presented with vertigo and was found to have multifocal acute ischemic strokes anterior and posterior circulations bilaterally c/w central embolic source on MRI (personal review). Her dizziness has improved and she has no other neurologic deficits.  Stroke workup this admission:  Stroke Labs     Component Value Date/Time   CHOL 171 08/08/2022 0011   TRIG 162 (H) 08/08/2022 0011   HDL 45 08/08/2022 0011   CHOLHDL 3.8 08/08/2022 0011   VLDL 32 08/08/2022 0011   LDLCALC 94 08/08/2022 0011    Lab Results  Component Value Date/Time   HGBA1C 5.7 (H) 08/08/2022 12:11 AM   CTA H&N, TTE, BLE [place] have not been done   ROS   Per HPI: all other systems reviewed and are negative  Past History   I have reviewed the following:  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HCC)    Ductal carcinoma in situ (DCIS) of right breast    Fibrocystic breast disease    GERD (gastroesophageal reflux disease)    Hearing aid worn    bilateral   History of hiatal hernia    Hyperlipidemia    Lung nodule    Menopause    Migraine    Migraine    optical migraines   Motion sickness    Plantar fasciitis    Prolapse of female pelvic organs    hodge pessary   Raynaud's disease    Sciatic leg pain    Sciatica    Skin cancer of face    Urinary retention with incomplete bladder emptying    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   Vitreous detachment of both eyes    Past Surgical History:  Procedure Laterality Date   APPENDECTOMY  1951    BREAST LUMPECTOMY WITH RADIOACTIVE SEED LOCALIZATION Right 05/29/2020   Procedure: RIGHT BREAST LUMPECTOMY WITH RADIOACTIVE SEED LOCALIZATION;  Surgeon: Emelia Loron, MD;  Location: [facility];  Service: General;  Laterality: Right;   CARDIAC CATHETERIZATION  2004   negative   CATARACT EXTRACTION W/PHACO Right 02/26/2021   Procedure: CATARACT EXTRACTION PHACO AND INTRAOCULAR LENS PLACEMENT (IOC) RIGHT 5.86 01:19.6 7.4%;  Surgeon: Lockie Mola, MD;  Location: [facility];  Service: Ophthalmology;  Laterality: Right;   CATARACT EXTRACTION W/PHACO Left 09/03/2021   Procedure: CATARACT EXTRACTION PHACO AND INTRAOCULAR LENS PLACEMENT (IOC) LEFT;  Surgeon: Lockie Mola, MD;  Location: [facility];  Service: Ophthalmology;  Laterality: Left;  9.61 01:15.9   COLONOSCOPY     2000, 2006, 2012   COLONOSCOPY WITH PROPOFOL N/A 03/27/2016   Procedure: COLONOSCOPY WITH PROPOFOL;  Surgeon: Scot Jun, MD;  Location: [facility];  Service: Endoscopy;  Laterality: N/A;   COLONOSCOPY WITH PROPOFOL N/A 08/14/2020   Procedure: COLONOSCOPY WITH PROPOFOL;  Surgeon: Toledo, Boykin Nearing, MD;  Location: [facility] END[facility]COPY;  Service: Gastroenterology;  Laterality: N/A;   CYSTOSCOPY N/A 12/24/2021  Procedure: CYSTOSCOPY;  Surgeon: Marguerita Beards, MD;  Location: [facility] OR[facility];  Service: Gynecology;  Laterality: N/A;   DILATATION & CURETTAGE/HYSTEROSCOPY WITH MYOSURE N/A 12/01/2021   Procedure: DILATATION & CURETTAGE/HYSTEROSCOPY WITH MYOSURE;  Surgeon: Hildred Laser, MD;  Location: [facility];  Service: Gynecology;  Laterality: N/A;   ESOPHAGOGASTRODUODENOSCOPY  08/1999   ESOPHAGOGASTRODUODENOSCOPY (EGD) WITH PROPOFOL N/A 08/14/2020   Procedure: ESOPHAGOGASTRODUODENOSCOPY (EGD) WITH PROPOFOL;  Surgeon: Toledo, Boykin Nearing, MD;  Location: [facility];  Service: Gastroenterology;  Laterality: N/A;   ESOPHAGOGASTRODUODENOSCOPY (EGD) WITH PROPOFOL N/A 03/21/2021    Procedure: ESOPHAGOGASTRODUODENOSCOPY (EGD) WITH PROPOFOL;  Surgeon: Toledo, Boykin Nearing, MD;  Location: [facility];  Service: Gastroenterology;  Laterality: N/A;   PORTA CATH INSERTION N/A 01/15/2022   Procedure: PORTA CATH INSERTION;  Surgeon: Annice Needy, MD;  Location: [facility];  Service: Cardiovascular;  Laterality: N/A;   PORTA CATH REMOVAL N/A 06/15/2022   Procedure: PORTA CATH REMOVAL;  Surgeon: Annice Needy, MD;  Location: [facility];  Service: Cardiovascular;  Laterality: N/A;   SKIN CANCER EXCISION     face and neck   TOTAL LAPAROSCOPIC HYSTERECTOMY WITH BILATERAL SALPINGO OOPHORECTOMY Bilateral 12/24/2021   Procedure: TOTAL LAPAROSCOPIC HYSTERECTOMY WITH BILATERAL SALPINGO OOPHORECTOMY, PELVIC NODE BIOPSY,  PESSARY REMOVAL (PESSARY REMOVAL BY DR. Florian Buff;  Surgeon: Leida Lauth, MD;  Location: [facility];  Service: Gynecology;  Laterality: Bilateral;   Family History  Problem Relation Age of Onset   Breast cancer Mother    Heart disease Father    Colon cancer Brother    Diabetes Neg Hx    Ovarian cancer Neg Hx    Social History   Socioeconomic History   Marital status: Widowed    Spouse name: Not on file   Number of children: Not on file   Years of education: Not on file   Highest education level: Not on file  Occupational History   Not on file  Tobacco Use   Smoking status: Never   Smokeless tobacco: Never  Vaping Use   Vaping Use: Never used  Substance and Sexual Activity   Alcohol use: No   Drug use: No   Sexual activity: Not Currently    Birth control/protection: Post-menopausal  Other Topics Concern   Not on file  Social History Narrative   Lives alone   Social Determinants of Health   Financial Resource Strain: Not on file  Food Insecurity: Not on file  Transportation Needs: Not on file  Physical Activity: Sufficiently Active (12/21/2017)   Exercise Vital Sign    Days of Exercise per Week: 3 days    Minutes of Exercise per  Session: 60 min  Stress: Not on file  Social Connections: Not on file   No Known Allergies  Medications   (Not in a hospital admission)     Current Facility-Administered Medications:    acetaminophen (TYLENOL) tablet 1,000 mg, 1,000 mg, Oral, Q6H PRN, Gertha Calkin, MD   alum & mag hydroxide-simeth (MAALOX/MYLANTA) 200-200-20 MG/5ML suspension 30 mL, 30 mL, Oral, Q6H PRN, Gertha Calkin, MD   aspirin chewable tablet 81 mg, 81 mg, Oral, Daily, Irena Cords V, MD, 81 mg at 08/08/22 0132   atorvastatin (LIPITOR) tablet 20 mg, 20 mg, Oral, Daily, Irena Cords V, MD, 20 mg at 08/08/22 0132   cholecalciferol (VITAMIN D3) 25 MCG (1000 UNIT) tablet 5,000 Units, 5,000 Units, Oral, q AM, Gertha Calkin, MD, 5,000 Units at 08/08/22 0658   clopidogrel (PLAVIX) tablet 75 mg,  75 mg, Oral, Daily, Jefferson Fuel, MD, 75 mg at 08/08/22 1049   ferrous fumarate-b12-vitamic C-folic acid (TRINSICON / FOLTRIN) capsule 1 capsule, 1 capsule, Oral, q AM, Irena Cords V, MD   gabapentin (NEURONTIN) capsule 300 mg, 300 mg, Oral, BID, Irena Cords V, MD, 300 mg at 08/08/22 1049   hydrALAZINE (APRESOLINE) injection 10 mg, 10 mg, Intravenous, Q6H PRN, Irena Cords V, MD, 10 mg at 08/08/22 1327   lactated ringers infusion, , Intravenous, Continuous, Irena Cords V, MD, Last Rate: 100 mL/hr at 08/08/22 1055, Rate Verify at 08/08/22 1055   nitroGLYCERIN (NITROSTAT) SL tablet 0.4 mg, 0.4 mg, Sublingual, Q5 min PRN, Gertha Calkin, MD   pantoprazole (PROTONIX) EC tablet 40 mg, 40 mg, Oral, q AM, Irena Cords V, MD, 40 mg at 08/08/22 0658   sodium chloride flush (NS) 0.9 % injection 3 mL, 3 mL, Intravenous, Once, Concha Se, MD   topiramate (TOPAMAX) tablet 50 mg, 50 mg, Oral, QPM, Irena Cords V, MD, 50 mg at 08/08/22 0131  Current Outpatient Medications:    Calcium-Vitamin D (CALTRATE 600 PLUS-VIT D PO), Take 1 tablet by mouth in the morning., Disp: , Rfl:    Carboxymethylcellulose Sod PF 1 % GEL, Place 1 drop into both eyes  at bedtime., Disp: , Rfl:    celecoxib (CELEBREX) 200 MG capsule, Take 200 mg by mouth 2 (two) times daily., Disp: , Rfl:    Cholecalciferol (VITAMIN D) 125 MCG (5000 UT) CAPS, Take 5,000 Units by mouth in the morning., Disp: , Rfl:    Coenzyme Q10 200 MG TABS, Take 200 mg by mouth in the morning., Disp: , Rfl:    Cyanocobalamin (VITAMIN B-12 PO), Take 1 tablet by mouth in the morning., Disp: , Rfl:    gabapentin (NEURONTIN) 300 MG capsule, Take 1 capsule (300 mg total) by mouth 2 (two) times daily. (Patient taking differently: Take 400 mg by mouth 2 (two) times daily.), Disp: 60 capsule, Rfl: 2   Iron-Vitamin C (VITRON-C) 65-125 MG TABS, Take 1 tablet by mouth in the morning., Disp: , Rfl:    pantoprazole (PROTONIX) 40 MG tablet, Take 40 mg by mouth in the morning., Disp: , Rfl:    RESTASIS 0.05 % ophthalmic emulsion, Place 1 drop into both eyes 2 (two) times daily., Disp: , Rfl:    simvastatin (ZOCOR) 10 MG tablet, Take 10 mg by mouth every evening., Disp: , Rfl:    topiramate (TOPAMAX) 50 MG tablet, Take 50 mg by mouth every evening., Disp: , Rfl:    vitamin E 180 MG (400 UNITS) capsule, Take 400 Units by mouth in the morning., Disp: , Rfl:    acetaminophen (TYLENOL) 500 MG tablet, Take 1,000 mg by mouth every 6 (six) hours as needed for moderate pain or mild pain., Disp: , Rfl:    alum & mag hydroxide-simeth (MAALOX/MYLANTA) 200-200-20 MG/5ML suspension, Take 30 mLs by mouth every 6 (six) hours as needed for indigestion or heartburn. (Patient not taking: Reported on 08/05/2022), Disp: , Rfl:    magnesium hydroxide (MILK OF MAGNESIA) 400 MG/5ML suspension, Take by mouth daily as needed for mild constipation., Disp: , Rfl:    Triamcinolone Acetonide (NASACORT ALLERGY 24HR NA), Place 1 spray into the nose daily as needed (sinus congestion)., Disp: , Rfl:   Vitals   Vitals:   08/08/22 1315 08/08/22 1330 08/08/22 1330 08/08/22 1335  BP:  (!) 182/88 (!) 182/88 (!) 169/87  Pulse: 78 77 84 91   Resp: 16  20 16 19   Temp:   98.1 F (36.7 C)   TempSrc:   Oral   SpO2: 100% 99% 98% 100%  Weight:      Height:         Body mass index is 18.51 kg/m.  Physical Exam   Physical Exam Gen: A&O x4, NAD HEENT: Atraumatic, normocephalic;mucous membranes moist; oropharynx clear, tongue without atrophy or fasciculations. Neck: Supple, trachea midline. Resp: CTAB, no w/r/r CV: RRR, no m/g/r; nml S1 and S2. 2+ symmetric peripheral pulses. Abd: soft/NT/ND; nabs x 4 quad Extrem: Nml bulk; no cyanosis, clubbing, or edema.  Neuro: *MS: A&O x4. Follows multi-step commands.  *Speech: fluid, nondysarthric, able to name and repeat *CN:    I: Deferred   II,III: PERRLA, VFF by confrontation, optic discs unable to be visualized 2/2 pupillary constriction   III,IV,VI: EOMI w/o nystagmus, no ptosis   V: Sensation intact from V1 to V3 to LT   VII: Eyelid closure was full.  Smile symmetric.   VIII: Hearing intact to voice   IX,X: Voice normal, palate elevates symmetrically    XI: SCM/trap 5/5 bilat   XII: Tongue protrudes midline, no atrophy or fasciculations   *Motor:   Normal bulk.  No tremor, rigidity or bradykinesia. No pronator drift.    Strength: Dlt Bic Tri WrE WrF FgS Gr HF KnF KnE PlF DoF    Left 5 5 5 5 5 5 5 5 5 5 5 5     Right 5 5 5 5 5 5 5 5 5 5 5 5     *Sensory: Impaired to LT and PP below the knee, patient says is chronic 2/2 chemo. Symmetric. Propioception intact bilat.  No double-simultaneous extinction.  *Coordination:  FNF intact bilat *Reflexes:  2+ and symmetric throughout without clonus; toes down-going bilat *Gait: deferred  NIHSS 0  Premorbid mRS = 1   Labs   CBC:  Recent Labs  Lab 08/05/22 1042 08/07/22 1458  WBC 4.7 5.5  NEUTROABS 3.3 4.3  HGB 12.2 12.0  HCT 34.5* 35.6*  MCV 96.6 98.6  PLT 109* 121*    Basic Metabolic Panel:  Lab Results  Component Value Date   NA 135 08/07/2022   K 4.0 08/07/2022   CO2 24 08/07/2022   GLUCOSE 117 (H)  08/07/2022   BUN 15 08/07/2022   CREATININE 0.79 08/07/2022   CALCIUM 9.5 08/07/2022   GFRNONAA >60 08/07/2022   GFRAA 60 (L) 05/22/2020   Lipid Panel:  Lab Results  Component Value Date   LDLCALC 94 08/08/2022   HgbA1c:  Lab Results  Component Value Date   HGBA1C 5.7 (H) 08/08/2022   Urine Drug Screen: No results found for: "LABOPIA", "COCAINSCRNUR", "LABBENZ", "AMPHETMU", "THCU", "LABBARB"  Alcohol Level     Component Value Date/Time   ETH <10 08/07/2022 1458     Impression   81 yo woman with hx breast and uterine cancer who presented with vertigo and was found to have multifocal acute ischemic strokes anterior and posterior circulations bilaterally c/w central embolic source. TTE with bubble is pending. Will also order BLE [place] given that she is hypercoag 2/2 malignancy and TTE is not 100% sensitive for PFO.  Recommendations   - F/u TTE - CTA H&N - BLE [place] - Goal normotension, avoid hypotension - ASA 81mg  daily + plavix 75mg  daily x21 days f/b ASA 81mg  daily monotherapy after that - q4 hr neuro checks - STAT head CT for any change in neuro exam - Tele - PT/OT/SLP -  Stroke education - Amb referral to neurology upon discharge   ______________________________________________________________________   Thank you for the opportunity to take part in the care of this patient. If you have any further questions, please contact the neurology consultation attending.  Signed,  Bing Neighbors, MD [facility] [phone]  If 7pm- 7am, please page neurology on call as listed in AMION.  **Any copied and pasted documentation in this note was written by me in another application not billed for and pasted by me into this document.

## 2022-08-09 ENCOUNTER — Encounter: Payer: Self-pay | Admitting: Internal Medicine

## 2022-08-09 DIAGNOSIS — R42 Dizziness and giddiness: Secondary | ICD-10-CM | POA: Diagnosis not present

## 2022-08-09 DIAGNOSIS — I6310 Cerebral infarction due to embolism of unspecified precerebral artery: Secondary | ICD-10-CM | POA: Diagnosis not present

## 2022-08-09 MED ORDER — FERROUS FUMARATE 324 (106 FE) MG PO TABS
1.0000 | ORAL_TABLET | Freq: Every day | ORAL | Status: DC
  Administered 2022-08-09 – 2022-08-10 (×2): 106 mg via ORAL
  Filled 2022-08-09 (×2): qty 1

## 2022-08-09 MED ORDER — VITAMIN C 500 MG PO TABS
250.0000 mg | ORAL_TABLET | Freq: Every day | ORAL | Status: DC
  Administered 2022-08-09 – 2022-08-10 (×2): 250 mg via ORAL
  Filled 2022-08-09 (×2): qty 1

## 2022-08-09 MED ORDER — ORAL CARE MOUTH RINSE: 15.0000 mL | OROMUCOSAL | Status: DC | PRN

## 2022-08-09 MED ORDER — OXYCODONE-ACETAMINOPHEN 5-325 MG PO TABS: 1.0000 | ORAL_TABLET | Freq: Three times a day (TID) | ORAL | Status: DC | PRN

## 2022-08-09 MED ORDER — CYCLOSPORINE 0.05 % OP EMUL
1.0000 [drp] | Freq: Two times a day (BID) | OPHTHALMIC | Status: DC
  Administered 2022-08-09 (×2): 1 [drp] via OPHTHALMIC
  Filled 2022-08-09 (×3): qty 30

## 2022-08-09 NOTE — Progress Notes (Signed)
SLP Cancellation Note  Patient Details Name: Elizabeth Friedman MRN: [id number] DOB: Jun 04, 1941   Cancelled treatment:       Reason Eval/Treat Not Completed: SLP screened, no needs identified, will sign off  Marinell Igarashi B. Rutherford Nail, M.S., CCC-SLP, [place] Pathologist Certified Brain Injury [place]  [facility] [phone] Ascom [phone] Fax [phone]   Stormy Fabian 08/09/2022, 2:41 PM

## 2022-08-09 NOTE — Progress Notes (Signed)
[facility]    SUBJECTIVE: Patient states to be feeling better more energy improved headaches ambulating in the halls well improved appetite getting physical therapy.   Vitals:   08/08/22 2232 08/09/22 0000 08/09/22 0350 08/09/22 0817  BP: (!) 165/84 (!) 157/79 138/73 (!) 150/80  Pulse: 73 76 81 76  Resp: '18 16 16 16  '$ Temp: 98.4 F (36.9 C) 98.6 F (37 C) 98.3 F (36.8 C) 98.9 F (37.2 C)  TempSrc: Oral Oral Oral   SpO2: 100% 100% 96% 99%  Weight:      Height:         Intake/Output Summary (Last 24 hours) at 08/09/2022 1019 Last data filed at 08/09/2022 0900 Gross per 24 hour  Intake 240 ml  Output --  Net 240 ml      PHYSICAL EXAM  General: Well developed, well nourished, in no acute distress HEENT:  Normocephalic and atramatic Neck:  No JVD.  Lungs: Clear bilaterally to auscultation and percussion. Heart: HRRR . Normal S1 and S2 without gallops or murmurs.  Abdomen: Bowel sounds are positive, abdomen soft and non-tender  Msk:  Back normal, normal gait. Normal strength and tone for age. Extremities: No clubbing, cyanosis or edema.   Neuro: Alert and oriented X 3. Psych:  Good affect, responds appropriately   LABS: Basic Metabolic Panel: Recent Labs    08/07/22 1458  NA 135  K 4.0  CL 103  CO2 24  GLUCOSE 117*  BUN 15  CREATININE 0.79  CALCIUM 9.5   Liver Function Tests: Recent Labs    08/07/22 1458  AST 25  ALT 18  ALKPHOS 92  BILITOT 0.6  PROT 7.2  ALBUMIN 4.3   No results for input(s): "LIPASE", "AMYLASE" in the last 72 hours. CBC: Recent Labs    08/07/22 1458  WBC 5.5  NEUTROABS 4.3  HGB 12.0  HCT 35.6*  MCV 98.6  PLT 121*   Cardiac Enzymes: No results for input(s): "CKTOTAL", "CKMB", "CKMBINDEX", "TROPONINI" in the last 72 hours. BNP: Invalid input(s): "POCBNP" D-Dimer: No results for input(s): "DDIMER" in the last 72 hours. Hemoglobin A1C: Recent Labs    08/08/22 0011  HGBA1C 5.7*   Fasting Lipid Panel: Recent  Labs    08/08/22 0011  CHOL 171  HDL 45  LDLCALC 94  TRIG 162*  CHOLHDL 3.8   Thyroid Function Tests: No results for input(s): "TSH", "T4TOTAL", "T3FREE", "THYROIDAB" in the last 72 hours.  Invalid input(s): "FREET3" Anemia Panel: No results for input(s): "VITAMINB12", "FOLATE", "FERRITIN", "TIBC", "IRON", "RETICCTPCT" in the last 72 hours.  CT ANGIO HEAD NECK W WO CM  Result Date: 08/08/2022 CLINICAL DATA:  Acute neurologic deficit EXAM: CT ANGIOGRAPHY HEAD AND NECK TECHNIQUE: Multidetector CT imaging of the head and neck was performed using the standard protocol during bolus administration of intravenous contrast. Multiplanar CT image reconstructions and MIPs were obtained to evaluate the vascular anatomy. Carotid stenosis measurements (when applicable) are obtained utilizing NASCET criteria, using the distal internal carotid diameter as the denominator. RADIATION DOSE REDUCTION: This exam was performed according to the departmental dose-optimization program which includes automated exposure control, adjustment of the mA and/or kV according to patient size and/or use of iterative reconstruction technique. CONTRAST:  34m OMNIPAQUE IOHEXOL 350 MG/ML SOLN COMPARISON:  08/07/2022 FINDINGS: CT HEAD FINDINGS Brain: There is no mass, hemorrhage or extra-axial collection. The size and configuration of the ventricles and extra-axial CSF spaces are normal. 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 There is hypoattenuation  of the periventricular white matter, most commonly indicating chronic ischemic microangiopathy. Skull: The visualized skull base, calvarium and extracranial soft tissues are normal. Sinuses/Orbits: No fluid levels or advanced mucosal thickening of the visualized paranasal sinuses. No mastoid or middle ear effusion. The orbits are normal. CTA NECK FINDINGS SKELETON: There is no bony spinal canal stenosis. No lytic or blastic lesion. OTHER NECK: Normal pharynx, larynx and major  salivary glands. No cervical lymphadenopathy. Unremarkable thyroid gland. UPPER CHEST: Biapical scarring AORTIC ARCH: There is calcific atherosclerosis of the aortic arch. There is no aneurysm, dissection or hemodynamically significant stenosis of the visualized portion of the aorta. Conventional 3 vessel aortic branching pattern. The visualized proximal subclavian arteries are widely patent. RIGHT CAROTID SYSTEM: No dissection, occlusion or aneurysm. Mild atherosclerotic calcification at the carotid bifurcation without hemodynamically significant stenosis. LEFT CAROTID SYSTEM: No dissection, occlusion or aneurysm. Mild atherosclerotic calcification at the carotid bifurcation without hemodynamically significant stenosis. VERTEBRAL ARTERIES: Left dominant configuration. Both origins are clearly patent. There is no dissection, occlusion or flow-limiting stenosis to the skull base (V1-V3 segments). CTA HEAD FINDINGS POSTERIOR CIRCULATION: --Vertebral arteries: Normal V4 segments. --Inferior cerebellar arteries: Normal. --Basilar artery: Moderate stenosis of the distal basilar artery. --Superior cerebellar arteries: Normal. --Posterior cerebral arteries (PCA): Short segment severe stenosis/occlusion of the left P1 segment with reconstitution. Severe stenosis or short segment occlusion of the proximal left P3 segment. Right PCA is normal. There is a fetal predominant origin of the right PCA and a diminutive left P-comm. ANTERIOR CIRCULATION: --Intracranial internal carotid arteries: Normal. --Anterior cerebral arteries (ACA): Normal. Both A1 segments are present. Patent anterior communicating artery (a-comm). --Middle cerebral arteries (MCA): Normal. VENOUS SINUSES: As permitted by contrast timing, patent. ANATOMIC VARIANTS: Fetal origin of the right posterior cerebral artery. Review of the MIP images confirms the above findings. IMPRESSION: 1. Short segment severe stenosis/occlusion of the left P1 segment with  reconstitution. 2. Severe stenosis or short segment occlusion of the proximal left P3 segment. 3. Moderate stenosis of the distal basilar artery. 4. 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 5. Mild bilateral carotid bifurcation atherosclerosis without hemodynamically significant stenosis. Electronically Signed   By: Ulyses Jarred M.D.   On: 08/08/2022 19:02   US Venous Img Lower Bilateral (DVT)  Result Date: 08/08/2022 CLINICAL DATA:  Embolic stroke. Hypercoagulable. Clinical concern for DVT. EXAM: BILATERAL LOWER EXTREMITY VENOUS DOPPLER ULTRASOUND TECHNIQUE: Gray-scale sonography with compression, as well as color and duplex ultrasound, were performed to evaluate the deep venous system(s) from the level of the common femoral vein through the popliteal and proximal calf veins. COMPARISON:  None Available. FINDINGS: VENOUS Normal compressibility of the common femoral, superficial femoral, and popliteal veins, as well as the visualized calf veins. Visualized portions of profunda femoral vein and great saphenous vein unremarkable. No filling defects to suggest DVT on grayscale or color Doppler imaging. Doppler waveforms show normal direction of venous flow, normal respiratory plasticity and response to augmentation. Limited views of the contralateral common femoral vein are unremarkable. OTHER None. Limitations: Left peroneal vein not visualized. IMPRESSION: No evidence of a right or left lower extremity deep venous thrombosis. Electronically Signed   By: Lajean Manes M.D.   On: 08/08/2022 18:17   NM Pulmonary Perfusion  Result Date: 08/08/2022 CLINICAL DATA:  Pulmonary embolism suspected. EXAM: NUCLEAR MEDICINE PERFUSION LUNG SCAN TECHNIQUE: Perfusion images were obtained in multiple projections after intravenous injection of radiopharmaceutical. Ventilation scans intentionally deferred if perfusion scan and chest x-ray adequate for interpretation during COVID  19 epidemic. RADIOPHARMACEUTICALS:   4.1 mCi Tc-75mMAA IV COMPARISON:  Chest x-ray earlier same day FINDINGS: Heterogeneous lung perfusion identified in the upper lobes bilaterally and in both lung bases. No definite peripheral wedge-shaped filling defect in either lung. IMPRESSION: Lung perfusion scan indeterminate for acute pulmonary embolus. Heterogeneous perfusion may reflect underlying emphysema. Consider chest CTA to further evaluate if patient renal function permits. Electronically Signed   By: EMisty StanleyM.D.   On: 08/08/2022 10:40   DG Chest Port 1 View  Result Date: 08/08/2022 CLINICAL DATA:  Dyspnea EXAM: PORTABLE CHEST 1 VIEW COMPARISON:  CT 08/07/2022 FINDINGS: The heart size and mediastinal contours are within normal limits. Reticulonodular opacities within the periphery of the right lower lobe, similar to the recent previous CT. No new airspace consolidation. No pleural effusion or pneumothorax. Calcified granuloma at the left lung base. The visualized skeletal structures are unremarkable. IMPRESSION: Unchanged reticulonodular opacities within the periphery of the right lower lobe. No new airspace consolidation. Electronically Signed   By: NDavina PokeD.O.   On: 08/08/2022 09:13   MR Brain W and Wo Contrast  Result Date: 08/07/2022 CLINICAL DATA:  Dizziness EXAM: MRI HEAD WITHOUT AND WITH CONTRAST TECHNIQUE: Multiplanar, multiecho pulse sequences of the brain and surrounding structures were obtained without and with intravenous contrast. CONTRAST:  422mGADAVIST GADOBUTROL 1 MMOL/ML IV SOLN COMPARISON:  08/25/2019 FINDINGS: Brain: Scattered punctate foci of acute ischemia within both cerebellar hemispheres, left basal ganglia and both frontal lobes. Pattern is most consistent with emboli. No acute or chronic hemorrhage. There is multifocal hyperintense T2-weighted signal within the white matter. Parenchymal volume and CSF spaces are normal. The midline structures are normal. There is no abnormal contrast enhancement.  Vascular: Major flow voids are preserved. Skull and upper cervical spine: Normal calvarium and skull base. Visualized upper cervical spine and soft tissues are normal. Sinuses/Orbits:No paranasal sinus fluid levels or advanced mucosal thickening. No mastoid or middle ear effusion. Normal orbits. IMPRESSION: Scattered punctate foci of acute ischemia within both cerebellar hemispheres, left basal ganglia and both frontal lobes. Pattern is most consistent with emboli, most likely from a cardiac or proximal aortic source. No hemorrhage or mass effect. Electronically Signed   By: KeUlyses Jarred.D.   On: 08/07/2022 21:22   CT HEAD WO CONTRAST  Result Date: 08/07/2022 CLINICAL DATA:  Dizziness on Monday followed by vision changes on Wednesday EXAM: CT HEAD WITHOUT CONTRAST TECHNIQUE: Contiguous axial images were obtained from the base of the skull through the vertex without intravenous contrast. RADIATION DOSE REDUCTION: This exam was performed according to the departmental dose-optimization program which includes automated exposure control, adjustment of the mA and/or kV according to patient size and/or use of iterative reconstruction technique. COMPARISON:  Brain MRI 08/25/2019 FINDINGS: Brain: There is no acute intracranial hemorrhage, extra-axial fluid collection, or acute territorial infarct. Parenchymal volume is normal for age. The ventricles are normal in size. Gray-white differentiation is preserved. There is a small remote lacunar infarct in the left caudate head, new since the prior MRI from 2020. There is no mass lesion.  There is no mass effect or midline shift. Vascular: No hyperdense vessel or unexpected calcification. Skull: Normal. Negative for fracture or focal lesion. Sinuses/Orbits: The imaged paranasal sinuses are clear. Bilateral lens implants are in place. The globes and orbits are otherwise unremarkable. Other: None. IMPRESSION: 1. No acute intracranial pathology. 2. Small infarct in the left  caudate head is new since the brain MRI from 2020 but is  remote in appearance. Otherwise, unremarkable for age head CT. Electronically Signed   By: Valetta Mole M.D.   On: 08/07/2022 16:52   CT CHEST ABDOMEN PELVIS W CONTRAST  Result Date: 08/07/2022 CLINICAL DATA:  History of endometrial cancer, rising CA 125. * Tracking Code: BO * EXAM: CT CHEST, ABDOMEN, AND PELVIS WITH CONTRAST TECHNIQUE: Multidetector CT imaging of the chest, abdomen and pelvis was performed following the standard protocol during bolus administration of intravenous contrast. RADIATION DOSE REDUCTION: This exam was performed according to the departmental dose-optimization program which includes automated exposure control, adjustment of the mA and/or kV according to patient size and/or use of iterative reconstruction technique. CONTRAST:  79m OMNIPAQUE IOHEXOL 300 MG/ML  SOLN COMPARISON:  CT December 11, 2021. FINDINGS: CT CHEST FINDINGS Cardiovascular: Aortic atherosclerosis. Normal caliber thoracic aorta. No central pulmonary embolus on this nondedicated study. Coronary artery calcifications. Normal size heart. No significant pericardial effusion/thickening. Mediastinum/Nodes: No supraclavicular adenopathy. No discrete thyroid nodule. No pathologically enlarged mediastinal, hilar or axillary lymph nodes. Unchanged calcified nodal tissue in the subcarinal space. The esophagus is grossly unremarkable. Lungs/Pleura: Biapical pleuroparenchymal scarring. Bronchiectasis with peripheral airway impaction noted in the right lower lobe on image 93/5 with adjacent tree-in-bud nodules. Additional tree-in-bud nodularity in the right middle lobe, lingula and scattered throughout the right lower lobe. Similar ground-glass opacities in the left lower lobe for instance on image 111/5 measuring 7 mm and 95/5 measuring 5 mm, unchanged. No new suspicious pulmonary nodules or masses. No pleural effusion. No pneumothorax. Musculoskeletal: Biopsy clip in the  right breast. No aggressive lytic or blastic lesion of bone. Multilevel degenerative changes spine. Diffuse demineralization of bone. CT ABDOMEN PELVIS FINDINGS Hepatobiliary: No suspicious hepatic lesion. Gallbladder is unremarkable. Common bile duct measures 5 mm, unchanged Pancreas: No pancreatic ductal dilation or evidence of acute inflammation. Spleen: Calcified hepatic granulomata. Adrenals/Urinary Tract: Bilateral adrenal glands appear normal. No hydronephrosis. Kidneys demonstrate symmetric enhancement and excretion of contrast material. Urinary bladder is unremarkable for degree of distension Stomach/Bowel: Radiopaque enteric contrast material traverses the hepatic flexure. Stomach is unremarkable for degree of distension. No pathologic dilation of small or large bowel. Gas fluid levels throughout the colon suggestive of diarrheal illness. Vascular/Lymphatic: Aortic atherosclerosis. No pathologically enlarged abdominal or pelvic lymph nodes. Reproductive: Uterus is surgically absent, no soft tissue nodularity along the vaginal cuff. No suspicious adnexal mass Other: Mild mesenteric edema with some heterogeneity of the omental fat, similar prior. Trace fluid in the pelvis for instance a focal area of fluid in the posterior left hemipelvis on image 111/2. Mild pelvic peritoneal thickening. No discrete peritoneal or omental nodularity. Musculoskeletal: No aggressive lytic or blastic lesion of bone. Degenerative grade 1 L4 on L5 anterolisthesis. Multilevel degenerative changes spine. Diffuse demineralization of bone. IMPRESSION: 1. Status post hysterectomy without soft tissue nodularity along the vaginal cuff. 2. Trace pelvic free fluid and pelvic peritoneal thickening with similar mild mesenteric edema and heterogeneity of the omental fat. No discrete peritoneal or omental nodularity. 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 Consider attention on short-term interval follow-up  imaging. 3. Bronchiectasis with peripheral airway impaction in the right lower lobe and adjacent tree-in-bud nodules as well as additional tree-in-bud nodularity in the right middle lobe, lingula and scattered throughout the right lower lobe. Findings are favored to reflect an atypical infectious or inflammatory process such as atypical mycobacterial infection. 4. Stable ground-glass opacities in the left lower lobe measuring up to 7 mm, nonspecific possibly reflecting an infectious or inflammatory process. Attention on  follow-up imaging suggested. 5. Gas fluid levels throughout the colon as can be seen with diarrheal illness 6.  Aortic Atherosclerosis (ICD10-I70.0). Electronically Signed   By: Dahlia Bailiff M.D.   On: 08/07/2022 12:11     Echo pending  TELEMETRY: Normal sinus rhythm rate of about 80 nonspecific ST-T wave changes:  ASSESSMENT AND PLAN:  Principal Problem:   Dizziness Active Problems:   Acid reflux   Iron deficiency anemia   Elevated troponin I measurement   H/O: GI bleed    Plan Multiple CVAs possibly related to embolic source continue work-up and evaluation by neurology Recommend outpatient evaluation for arrhythmias including atrial fibrillation possible with an event monitor Continue aspirin/Plavix for CVA Continue rehab physical therapy Occupational Therapy Awaiting echocardiogram for further evaluation of valvular disease versus thrombus Elevated troponins more suggestive of demand ischemia doubt ACS do not recommend cardiac cath with her recent CVA Consider functional study as an outpatient versus cardiac CT Elevated troponins unlikely to be a non-STEMI probably demand ischemia we will proceed with ischemia work-up as an outpatient   Yolonda Kida, MD 08/09/2022 10:19 AM

## 2022-08-09 NOTE — Progress Notes (Signed)
Physical Therapy Treatment Patient Details Name: Elizabeth Friedman MRN: [id number] DOB: 1941/02/04 Today's Date: 08/09/2022   History of Present Illness Elizabeth Friedman is a 81 y.o. female with past medical history of breast and uterine cancer with chemotherapy over 1 month ago, finished radiation therapy recently who presents with several discrete episodes of sudden onset dizziness that spontaneously resolves after a few seconds. This is dating back several weeks increasing in frequency. She also had a transient episode of right arm weakness on Wednesday that has now completely resolved. MRI brain: Scattered punctate foci of acute ischemia within both cerebellar  hemispheres, left basal ganglia and both frontal lobes. Pattern is  most consistent with emboli, most likely from a cardiac or proximal aortic source. No hemorrhage or mass effect.    PT Comments    Pt feeling better today apart from some mild fatigue.  She reported no dizziness or nausea, much less eye focusing/pressure issues and generally reported that she feels she has improved since our session yesterday.  She was able to ambulate ~300 ft and negotiate steps with SPC and while she did not have any LOBs she had some mild unsteadiness (no need for PT intervention) but was steadier with the walker and so will continue to recommend that for now.  Pt did well with most low grade balance exercises but struggled with backward LOBs during more challenging tasks (heel-toe, elevated toe taps, etc), she scored a 47/56 on the Berg Balance Scale indicating need for AD.  Pt clearly improved from yesterday, though not at baseline.  Recommending HHPT with continued suggestion for close supervision as she initially transitions home.   Recommendations for follow up therapy are one component of a multi-disciplinary discharge planning process, led by the attending physician.  Recommendations may be updated based on patient status, additional functional criteria and  insurance authorization.  Follow Up Recommendations  Home health PT Can patient physically be transported by private vehicle: Yes   Assistance Recommended at Discharge Intermittent Supervision/Assistance  Patient can return home with the following Assistance with cooking/housework;Assist for transportation;Help with stairs or ramp for entrance   Equipment Recommendations  Rollator (4 wheels)    Recommendations for Other Services       Precautions / Restrictions Precautions Precautions: Fall Restrictions Weight Bearing Restrictions: No     Mobility  Bed Mobility Overal bed mobility: Modified Independent Bed Mobility: Supine to Sit, Sit to Supine           General bed mobility comments: Pt able to get to/from supine/sit w/o assist or hesitation    Transfers Overall transfer level: Modified independent Equipment used: None Transfers: Sit to/from Stand Sit to Stand: Supervision           General transfer comment: at least 10 sit to stand efforts t/o session, all w/o excessive UE use, no AD and ability to attain/sustain balance w/o issue    Ambulation/Gait Ambulation/Gait assistance: Min guard Gait Distance (Feet): 300 Feet Assistive device: Straight cane         General Gait Details: Pt was able to circumambulate the nurses' station with [facility], however she did have small, occasional unsteadiness that she was able to to self arrest.  However Vitals appropriate and pt able to converse appropriately t/o the effort.  Pt with no LOBs even with head turns and distractions, but PT recommending to continue using a walker at this point.   Stairs Stairs: Yes Stairs assistance: Min guard Stair Management: One rail Left, With cane,  Alternating pattern Number of Stairs: 6 General stair comments: Pt with definite need of rails, but able to negotiate up/down steps safely w/o assist   Wheelchair Mobility    Modified Rankin (Stroke Patients Only)       Balance  Overall balance assessment: Needs assistance   Sitting balance-Leahy Scale: Normal       Standing balance-Leahy Scale: Good                   Standardized Balance Assessment Standardized Balance Assessment : Berg Balance Test Berg Balance Test Sit to Stand: Able to stand without using hands and stabilize independently Standing Unsupported: Able to stand safely 2 minutes Sitting with Back Unsupported but Feet Supported on Floor or Stool: Able to sit safely and securely 2 minutes Stand to Sit: Sits safely with minimal use of hands Transfers: Able to transfer safely, minor use of hands Standing Unsupported with Eyes Closed: Able to stand 10 seconds safely Standing Ubsupported with Feet Together: Able to place feet together independently and stand 1 minute safely From Standing, Reach Forward with Outstretched Arm: Can reach forward >12 cm safely (5") From Standing Position, Pick up Object from Floor: Able to pick up shoe safely and easily From Standing Position, Turn to Look Behind Over each Shoulder: Looks behind from both sides and weight shifts well Turn 360 Degrees: Able to turn 360 degrees safely in 4 seconds or less Standing Unsupported, Alternately Place Feet on Step/Stool: Able to complete >2 steps/needs minimal assist Standing Unsupported, One Foot in Front: Needs help to step but can hold 15 seconds Standing on One Leg: Able to lift leg independently and hold equal to or more than 3 seconds Total Score: 47        Cognition Arousal/Alertness: Awake/alert Behavior During Therapy: WFL for tasks assessed/performed Overall Cognitive Status: Within Functional Limits for tasks assessed                                          Exercises      General Comments General comments (skin integrity, edema, etc.): Pt with less c/o eye focusing, pressure issues today, did not have any issues with word finding during prolonged diverse and appropriate conversation       Pertinent Vitals/Pain Pain Assessment Pain Assessment: No/denies pain    Home Living                          Prior Function            PT Goals (current goals can now be found in the care plan section) Progress towards PT goals: Progressing toward goals    Frequency    7X/week      PT Plan Discharge plan needs to be updated    Co-evaluation              AM-PAC PT "6 Clicks" Mobility   Outcome Measure  Help needed turning from your back to your side while in a flat bed without using bedrails?: None Help needed moving from lying on your back to sitting on the side of a flat bed without using bedrails?: None Help needed moving to and from a bed to a chair (including a wheelchair)?: None Help needed standing up from a chair using your arms (e.g., wheelchair or bedside chair)?: None Help needed to walk in hospital room?:  None Help needed climbing 3-5 steps with a railing? : None 6 Click Score: 24    End of Session Equipment Utilized During Treatment: Gait belt Activity Tolerance: Patient tolerated treatment well Patient left: with bed alarm set;with call bell/phone within reach   PT Visit Diagnosis: Difficulty in walking, not elsewhere classified (R26.2);Other symptoms and signs involving the nervous system (R29.898);Unsteadiness on feet (R26.81);Dizziness and giddiness (R42)     Time: 1025-1050 PT Time Calculation (min) (ACUTE ONLY): 25 min  Charges:  $Gait Training: 8-22 mins $Therapeutic Activity: 8-22 mins                     Kreg Shropshire, DPT 08/09/2022, 12:58 PM

## 2022-08-09 NOTE — Plan of Care (Signed)
Patient admitted to [facility] 2A s/p CVA.   Problem: Education: Goal: Knowledge of General Education information will improve Description: Including pain rating scale, medication(s)/side effects and non-pharmacologic comfort measures Outcome: Progressing   Problem: Health Behavior/Discharge Planning: Goal: Ability to manage health-related needs will improve Outcome: Progressing   Problem: Clinical Measurements: Goal: Ability to maintain clinical measurements within normal limits will improve Outcome: Progressing Goal: Will remain free from infection Outcome: Progressing Goal: Diagnostic test results will improve Outcome: Progressing Goal: Respiratory complications will improve Outcome: Progressing Goal: Cardiovascular complication will be avoided Outcome: Progressing   Problem: Activity: Goal: Risk for activity intolerance will decrease Outcome: Progressing   Problem: Nutrition: Goal: Adequate nutrition will be maintained Outcome: Progressing   Problem: Coping: Goal: Level of anxiety will decrease Outcome: Progressing   Problem: Elimination: Goal: Will not experience complications related to bowel motility Outcome: Progressing Goal: Will not experience complications related to urinary retention Outcome: Progressing   Problem: Pain Managment: Goal: General experience of comfort will improve Outcome: Progressing   Problem: Safety: Goal: Ability to remain free from injury will improve Outcome: Progressing   Problem: Skin Integrity: Goal: Risk for impaired skin integrity will decrease Outcome: Progressing   Problem: Education: Goal: Knowledge of disease or condition will improve Outcome: Progressing Goal: Knowledge of secondary prevention will improve (SELECT ALL) Outcome: Progressing Goal: Knowledge of patient specific risk factors will improve (INDIVIDUALIZE FOR PATIENT) Outcome: Progressing Goal: Individualized Educational Video(s) Outcome: Progressing    Problem: Coping: Goal: Will verbalize positive feelings about self Outcome: Progressing Goal: Will identify appropriate support needs Outcome: Progressing   Problem: Health Behavior/Discharge Planning: Goal: Ability to manage health-related needs will improve Outcome: Progressing   Problem: Self-Care: Goal: Ability to participate in self-care as condition permits will improve Outcome: Progressing Goal: Verbalization of feelings and concerns over difficulty with self-care will improve Outcome: Progressing Goal: Ability to communicate needs accurately will improve Outcome: Progressing   Problem: Nutrition: Goal: Risk of aspiration will decrease Outcome: Progressing Goal: Dietary intake will improve Outcome: Progressing   Problem: Ischemic Stroke/TIA Tissue Perfusion: Goal: Complications of ischemic stroke/TIA will be minimized Outcome: Progressing

## 2022-08-09 NOTE — Plan of Care (Signed)
NIHSS remains 0. Patient is ambulating without difficulty with stand-by assistance.    Problem: Education: Goal: Knowledge of General Education information will improve Description: Including pain rating scale, medication(s)/side effects and non-pharmacologic comfort measures Outcome: Progressing   Problem: Health Behavior/Discharge Planning: Goal: Ability to manage health-related needs will improve Outcome: Progressing   Problem: Clinical Measurements: Goal: Ability to maintain clinical measurements within normal limits will improve Outcome: Progressing Goal: Will remain free from infection Outcome: Progressing Goal: Diagnostic test results will improve Outcome: Progressing Goal: Respiratory complications will improve Outcome: Progressing Goal: Cardiovascular complication will be avoided Outcome: Progressing   Problem: Activity: Goal: Risk for activity intolerance will decrease Outcome: Progressing   Problem: Nutrition: Goal: Adequate nutrition will be maintained Outcome: Progressing   Problem: Coping: Goal: Level of anxiety will decrease Outcome: Progressing   Problem: Elimination: Goal: Will not experience complications related to bowel motility Outcome: Progressing Goal: Will not experience complications related to urinary retention Outcome: Progressing   Problem: Pain Managment: Goal: General experience of comfort will improve Outcome: Progressing   Problem: Safety: Goal: Ability to remain free from injury will improve Outcome: Progressing   Problem: Skin Integrity: Goal: Risk for impaired skin integrity will decrease Outcome: Progressing   Problem: Education: Goal: Knowledge of disease or condition will improve Outcome: Progressing Goal: Knowledge of secondary prevention will improve (SELECT ALL) Outcome: Progressing Goal: Knowledge of patient specific risk factors will improve (INDIVIDUALIZE FOR PATIENT) Outcome: Progressing Goal: Individualized  Educational Video(s) Outcome: Progressing   Problem: Coping: Goal: Will verbalize positive feelings about self Outcome: Progressing Goal: Will identify appropriate support needs Outcome: Progressing   Problem: Health Behavior/Discharge Planning: Goal: Ability to manage health-related needs will improve Outcome: Progressing   Problem: Self-Care: Goal: Ability to participate in self-care as condition permits will improve Outcome: Progressing Goal: Verbalization of feelings and concerns over difficulty with self-care will improve Outcome: Progressing Goal: Ability to communicate needs accurately will improve Outcome: Progressing   Problem: Nutrition: Goal: Risk of aspiration will decrease Outcome: Progressing Goal: Dietary intake will improve Outcome: Progressing   Problem: Ischemic Stroke/TIA Tissue Perfusion: Goal: Complications of ischemic stroke/TIA will be minimized Outcome: Progressing

## 2022-08-09 NOTE — Progress Notes (Signed)
S: Patient has no code status ordered at present with "prior" listed in lieu of a code status.  B: Patient admitted 10/13 but no code status was ordered.  A: The covering provider, Neomia Glass, NP messaged by this RN via secure to clarify order for code status.  R: Still no code status ordered at present. NP to address.   Update: 0047 hrs, code status updated to DNR.

## 2022-08-09 NOTE — Progress Notes (Signed)
PROGRESS NOTE    Elizabeth Friedman   BJS:[id number] DOB: [birth year]  DOA: 08/07/2022 Date of Service: 08/09/22 PCP: Adin Hector, MD     Brief Narrative / Hospital Course:  81 year old female with medical history of breast and uterine cancer s/p chemotherapy 1 month ago, completed radiation therapy came to ED 08/07/2022 with complaints of sudden onset of dizziness that spontaneously resolved. She has been having intermittent episodes of dizziness over the past few weeks.  She also had transient episode of right arm weakness which completely resolved. She had a CT chest abdomen/pelvis done as outpatient ordered by oncology. CT head was unremarkable.  10/13: MRI brain showed scattered punctate foci of acute ischemia within the both cerebellar hemisphere likely consistent with emboli likely from cardiac or proximal aortic source. Admitted  10/14: Echo pending. BLE [place] no DVT. ASA '81mg'$  daily + plavix '75mg'$  daily x21 days f/b ASA '81mg'$  daily monotherapy after that. Cardiology - no invasive procedures recommended. CTA H/N as below full report (+) severe stenosis/occlusion concerns in L P1 w/ reconstitution and in proximal L P3, moderate stenosis distal basilar artery, mild bl carotid bifurcation atherosclerosis w/o significant stenosis.  10/15: Echocardiogram pending  - If no intracardiac clot, ok to d/c on cardiac monitor and DAPT in 90 days (d/t stenosis) then ASA 81 mg daily.  Consultants:  Neurology  Cardiology  Procedures: none      ASSESSMENT & PLAN:   Principal Problem:   Dizziness Active Problems:   Elevated troponin I measurement   Acid reflux   Iron deficiency anemia   H/O: GI bleed   Stroke Echocardiogram pending (on weekend, minimal staff to get echo done)  - If no intracardiac clot, ok to d/c on cardiac monitor and DAPT in 90 days then ASA 81 mg daily. CTA head/neck concerning occlusions left P1 and P3 - ok to manage medically w/ longer course DAPT Continue statin.  Lipid profile showed LDL 94, increase the dose of atorvastatin to 40 mg daily with a goal LDL less than 70 BP control  PT/OT consult   NSTEMI Patient has significantly elevated troponin 748,877, 849. EKG showed ST depression in leads II, III, aVF, T wave inversions in V2 V3 and V4 Cardiology - no invasive procedures at this time    History of GI bleed Patient was taken off antiplatelet therapy by GI due to GI bleeding Continue Protonix 40 mg daily   History of breast/uterine cancer S/p chemotherapy and radiation treatment Outpatient f/u onc     DVT prophylaxis: SCD Pertinent IV fluids/nutrition: no continuous IV fluids  Central lines / invasive devices: none  Code Status: FULL CODE  Family Communication: none at this time   Disposition: inpatient  TOC needs: HH PT Barriers to discharge / significant pending items: pending echo             Subjective:  Patient reports doing well, dizziness has improved, no CP/SOB, no HA/VC, tolerating diet, walking well w/ PT.        Objective:  Vitals:   08/09/22 0350 08/09/22 0817 08/09/22 1202 08/09/22 1658  BP: 138/73 (!) 150/80 (!) 131/59 139/78  Pulse: 81 76 83 86  Resp: '16 16 18 18  '$ Temp: 98.3 F (36.8 C) 98.9 F (37.2 C) 98.3 F (36.8 C) 98.8 F (37.1 C)  TempSrc: Oral     SpO2: 96% 99% 98% 98%  Weight:      Height:        Intake/Output Summary (Last 24  hours) at 08/09/2022 1740 Last data filed at 08/09/2022 0900 Gross per 24 hour  Intake 240 ml  Output --  Net 240 ml   Filed Weights   08/07/22 1441  Weight: 45.9 kg    Examination:  Constitutional:  VS as above General Appearance: alert, well-developed, well-nourished, NAD Respiratory: Normal respiratory effort No wheeze No rhonchi No rales Cardiovascular: S1/S2 normal No murmur No rub/gallop auscultated No lower extremity edema Gastrointestinal: No tenderness Musculoskeletal:  Symmetrical movement in all  extremities Neurological: No cranial nerve deficit on limited exam Alert Psychiatric: Normal judgment/insight Normal mood and affect       Scheduled Medications:   Ferrous Fumarate  1 tablet Oral Daily   And   ascorbic acid  250 mg Oral Daily   aspirin  81 mg Oral Daily   atorvastatin  20 mg Oral Daily   cholecalciferol  5,000 Units Oral q AM   clopidogrel  75 mg Oral Daily   cycloSPORINE  1 drop Both Eyes BID   gabapentin  300 mg Oral BID   pantoprazole  40 mg Oral q AM   sodium chloride flush  3 mL Intravenous Once   topiramate  50 mg Oral QPM    Continuous Infusions:   PRN Medications:  acetaminophen, alum & mag hydroxide-simeth, hydrALAZINE, magnesium hydroxide, nitroGLYCERIN, mouth rinse, oxyCODONE-acetaminophen  Antimicrobials:  Anti-infectives (From admission, onward)    None       Data Reviewed: I have personally reviewed following labs and imaging studies  CBC: Recent Labs  Lab 08/05/22 1042 08/07/22 1458  WBC 4.7 5.5  NEUTROABS 3.3 4.3  HGB 12.2 12.0  HCT 34.5* 35.6*  MCV 96.6 98.6  PLT 109* 462*   Basic Metabolic Panel: Recent Labs  Lab 08/05/22 1042 08/07/22 1458  NA 130* 135  K 4.3 4.0  CL 99 103  CO2 25 24  GLUCOSE 96 117*  BUN 14 15  CREATININE 0.92 0.79  CALCIUM 8.9 9.5   GFR: Estimated Creatinine Clearance: 39.3 mL/min (by C-G formula based on SCr of 0.79 mg/dL). Liver Function Tests: Recent Labs  Lab 08/05/22 1042 08/07/22 1458  AST 27 25  ALT 20 18  ALKPHOS 99 92  BILITOT 0.4 0.6  PROT 7.0 7.2  ALBUMIN 4.2 4.3   No results for input(s): "LIPASE", "AMYLASE" in the last 168 hours. No results for input(s): "AMMONIA" in the last 168 hours. Coagulation Profile: Recent Labs  Lab 08/07/22 1458  INR 1.2   Cardiac Enzymes: No results for input(s): "CKTOTAL", "CKMB", "CKMBINDEX", "TROPONINI" in the last 168 hours. BNP (last 3 results) No results for input(s): "PROBNP" in the last 8760 hours. HbA1C: Recent Labs     08/08/22 0011  HGBA1C 5.7*   CBG: No results for input(s): "GLUCAP" in the last 168 hours. Lipid Profile: Recent Labs    08/08/22 0011  CHOL 171  HDL 45  LDLCALC 94  TRIG 162*  CHOLHDL 3.8   Thyroid Function Tests: No results for input(s): "TSH", "T4TOTAL", "FREET4", "T3FREE", "THYROIDAB" in the last 72 hours. Anemia Panel: No results for input(s): "VITAMINB12", "FOLATE", "FERRITIN", "TIBC", "IRON", "RETICCTPCT" in the last 72 hours. Urine analysis:    Component Value Date/Time   COLORURINE STRAW (A) 01/22/2022 1625   APPEARANCEUR CLEAR (A) 01/22/2022 1625   LABSPEC 1.006 01/22/2022 1625   PHURINE 7.0 01/22/2022 1625   GLUCOSEU NEGATIVE 01/22/2022 1625   HGBUR SMALL (A) 01/22/2022 1625   BILIRUBINUR NEGATIVE 01/22/2022 1625   BILIRUBINUR Negative 12/19/2021 7035  KETONESUR NEGATIVE 01/22/2022 1625   PROTEINUR NEGATIVE 01/22/2022 1625   UROBILINOGEN 0.2 12/19/2021 0821   NITRITE NEGATIVE 01/22/2022 1625   LEUKOCYTESUR NEGATIVE 01/22/2022 1625   Sepsis Labs: '@LABRCNTIP'$ (procalcitonin:4,lacticidven:4)  Recent Results (from the past 240 hour(s))  Resp Panel by RT-PCR (Flu A&B, Covid) Anterior Nasal Swab     Status: None   Collection Time: 08/07/22  2:58 PM   Specimen: Anterior Nasal Swab  Result Value Ref Range Status   SARS Coronavirus 2 by RT PCR NEGATIVE NEGATIVE Final    Comment: (NOTE) SARS-CoV-2 target nucleic acids are NOT DETECTED.  The SARS-CoV-2 RNA is generally detectable in upper respiratory specimens during the acute phase of infection. The lowest concentration of SARS-CoV-2 viral copies this assay can detect is 138 copies/mL. A negative result does not preclude SARS-Cov-2 infection and should not be used as the sole basis for treatment or other patient management decisions. A negative result may occur with  improper specimen collection/handling, submission of specimen other than nasopharyngeal swab, presence of viral mutation(s) within the areas  targeted by this assay, and inadequate number of viral copies(<138 copies/mL). A negative result must be combined with clinical observations, patient history, and epidemiological information. The expected result is Negative.  Fact Sheet for Patients:  EntrepreneurPulse.com.au  Fact Sheet for Healthcare Providers:  IncredibleEmployment.be  This test is no t yet approved or cleared by the [place] FDA and  has been authorized for detection and/or diagnosis of SARS-CoV-2 by FDA under an Emergency Use Authorization (EUA). This EUA will remain  in effect (meaning this test can be used) for the duration of the COVID-19 declaration under Section 564(b)(1) of the Act, 21 U.S.C.section 360bbb-3(b)(1), unless the authorization is terminated  or revoked sooner.       Influenza A by PCR NEGATIVE NEGATIVE Final   Influenza B by PCR NEGATIVE NEGATIVE Final    Comment: (NOTE) The Xpert Xpress SARS-CoV-2/FLU/RSV plus assay is intended as an aid in the diagnosis of influenza from Nasopharyngeal swab specimens and should not be used as a sole basis for treatment. Nasal washings and aspirates are unacceptable for Xpert Xpress SARS-CoV-2/FLU/RSV testing.  Fact Sheet for Patients: EntrepreneurPulse.com.au  Fact Sheet for Healthcare Providers: IncredibleEmployment.be  This test is not yet approved or cleared by the [place] FDA and has been authorized for detection and/or diagnosis of SARS-CoV-2 by FDA under an Emergency Use Authorization (EUA). This EUA will remain in effect (meaning this test can be used) for the duration of the COVID-19 declaration under Section 564(b)(1) of the Act, 21 U.S.C. section 360bbb-3(b)(1), unless the authorization is terminated or revoked.  Performed at [facility], [address] [place], [place] [address]          Radiology Studies: CT ANGIO HEAD NECK W WO  CM  Result Date: 08/08/2022 CLINICAL DATA:  Acute neurologic deficit EXAM: CT ANGIOGRAPHY HEAD AND NECK TECHNIQUE: Multidetector CT imaging of the head and neck was performed using the standard protocol during bolus administration of intravenous contrast. Multiplanar CT image reconstructions and MIPs were obtained to evaluate the vascular anatomy. Carotid stenosis measurements (when applicable) are obtained utilizing NASCET criteria, using the distal internal carotid diameter as the denominator. RADIATION DOSE REDUCTION: This exam was performed according to the departmental dose-optimization program which includes automated exposure control, adjustment of the mA and/or kV according to patient size and/or use of iterative reconstruction technique. CONTRAST:  13m OMNIPAQUE IOHEXOL 350 MG/ML SOLN COMPARISON:  08/07/2022 FINDINGS: CT HEAD FINDINGS Brain: There  is no mass, hemorrhage or extra-axial collection. The size and configuration of the ventricles and extra-axial CSF spaces are normal. 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 There is hypoattenuation of the periventricular white matter, most commonly indicating chronic ischemic microangiopathy. Skull: The visualized skull base, calvarium and extracranial soft tissues are normal. Sinuses/Orbits: No fluid levels or advanced mucosal thickening of the visualized paranasal sinuses. No mastoid or middle ear effusion. The orbits are normal. CTA NECK FINDINGS SKELETON: There is no bony spinal canal stenosis. No lytic or blastic lesion. OTHER NECK: Normal pharynx, larynx and major salivary glands. No cervical lymphadenopathy. Unremarkable thyroid gland. UPPER CHEST: Biapical scarring AORTIC ARCH: There is calcific atherosclerosis of the aortic arch. There is no aneurysm, dissection or hemodynamically significant stenosis of the visualized portion of the aorta. Conventional 3 vessel aortic branching pattern. The visualized proximal subclavian arteries are  widely patent. RIGHT CAROTID SYSTEM: No dissection, occlusion or aneurysm. Mild atherosclerotic calcification at the carotid bifurcation without hemodynamically significant stenosis. LEFT CAROTID SYSTEM: No dissection, occlusion or aneurysm. Mild atherosclerotic calcification at the carotid bifurcation without hemodynamically significant stenosis. VERTEBRAL ARTERIES: Left dominant configuration. Both origins are clearly patent. There is no dissection, occlusion or flow-limiting stenosis to the skull base (V1-V3 segments). CTA HEAD FINDINGS POSTERIOR CIRCULATION: --Vertebral arteries: Normal V4 segments. --Inferior cerebellar arteries: Normal. --Basilar artery: Moderate stenosis of the distal basilar artery. --Superior cerebellar arteries: Normal. --Posterior cerebral arteries (PCA): Short segment severe stenosis/occlusion of the left P1 segment with reconstitution. Severe stenosis or short segment occlusion of the proximal left P3 segment. Right PCA is normal. There is a fetal predominant origin of the right PCA and a diminutive left P-comm. ANTERIOR CIRCULATION: --Intracranial internal carotid arteries: Normal. --Anterior cerebral arteries (ACA): Normal. Both A1 segments are present. Patent anterior communicating artery (a-comm). --Middle cerebral arteries (MCA): Normal. VENOUS SINUSES: As permitted by contrast timing, patent. ANATOMIC VARIANTS: Fetal origin of the right posterior cerebral artery. Review of the MIP images confirms the above findings. IMPRESSION: 1. Short segment severe stenosis/occlusion of the left P1 segment with reconstitution. 2. Severe stenosis or short segment occlusion of the proximal left P3 segment. 3. Moderate stenosis of the distal basilar artery. 4. 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 5. Mild bilateral carotid bifurcation atherosclerosis without hemodynamically significant stenosis. Electronically Signed   By: Ulyses Jarred M.D.   On: 08/08/2022 19:02   US Venous Img  Lower Bilateral (DVT)  Result Date: 08/08/2022 CLINICAL DATA:  Embolic stroke. Hypercoagulable. Clinical concern for DVT. EXAM: BILATERAL LOWER EXTREMITY VENOUS DOPPLER ULTRASOUND TECHNIQUE: Gray-scale sonography with compression, as well as color and duplex ultrasound, were performed to evaluate the deep venous system(s) from the level of the common femoral vein through the popliteal and proximal calf veins. COMPARISON:  None Available. FINDINGS: VENOUS Normal compressibility of the common femoral, superficial femoral, and popliteal veins, as well as the visualized calf veins. Visualized portions of profunda femoral vein and great saphenous vein unremarkable. No filling defects to suggest DVT on grayscale or color Doppler imaging. Doppler waveforms show normal direction of venous flow, normal respiratory plasticity and response to augmentation. Limited views of the contralateral common femoral vein are unremarkable. OTHER None. Limitations: Left peroneal vein not visualized. IMPRESSION: No evidence of a right or left lower extremity deep venous thrombosis. Electronically Signed   By: Lajean Manes M.D.   On: 08/08/2022 18:17   NM Pulmonary Perfusion  Result Date: 08/08/2022 CLINICAL DATA:  Pulmonary embolism suspected. EXAM: NUCLEAR  MEDICINE PERFUSION LUNG SCAN TECHNIQUE: Perfusion images were obtained in multiple projections after intravenous injection of radiopharmaceutical. Ventilation scans intentionally deferred if perfusion scan and chest x-ray adequate for interpretation during COVID 19 epidemic. RADIOPHARMACEUTICALS:  4.1 mCi Tc-72mMAA IV COMPARISON:  Chest x-ray earlier same day FINDINGS: Heterogeneous lung perfusion identified in the upper lobes bilaterally and in both lung bases. No definite peripheral wedge-shaped filling defect in either lung. IMPRESSION: Lung perfusion scan indeterminate for acute pulmonary embolus. Heterogeneous perfusion may reflect underlying emphysema. Consider chest CTA  to further evaluate if patient renal function permits. Electronically Signed   By: EMisty StanleyM.D.   On: 08/08/2022 10:40   DG Chest Port 1 View  Result Date: 08/08/2022 CLINICAL DATA:  Dyspnea EXAM: PORTABLE CHEST 1 VIEW COMPARISON:  CT 08/07/2022 FINDINGS: The heart size and mediastinal contours are within normal limits. Reticulonodular opacities within the periphery of the right lower lobe, similar to the recent previous CT. No new airspace consolidation. No pleural effusion or pneumothorax. Calcified granuloma at the left lung base. The visualized skeletal structures are unremarkable. IMPRESSION: Unchanged reticulonodular opacities within the periphery of the right lower lobe. No new airspace consolidation. Electronically Signed   By: NDavina PokeD.O.   On: 08/08/2022 09:13   MR Brain W and Wo Contrast  Result Date: 08/07/2022 CLINICAL DATA:  Dizziness EXAM: MRI HEAD WITHOUT AND WITH CONTRAST TECHNIQUE: Multiplanar, multiecho pulse sequences of the brain and surrounding structures were obtained without and with intravenous contrast. CONTRAST:  470mGADAVIST GADOBUTROL 1 MMOL/ML IV SOLN COMPARISON:  08/25/2019 FINDINGS: Brain: Scattered punctate foci of acute ischemia within both cerebellar hemispheres, left basal ganglia and both frontal lobes. Pattern is most consistent with emboli. No acute or chronic hemorrhage. There is multifocal hyperintense T2-weighted signal within the white matter. Parenchymal volume and CSF spaces are normal. The midline structures are normal. There is no abnormal contrast enhancement. Vascular: Major flow voids are preserved. Skull and upper cervical spine: Normal calvarium and skull base. Visualized upper cervical spine and soft tissues are normal. Sinuses/Orbits:No paranasal sinus fluid levels or advanced mucosal thickening. No mastoid or middle ear effusion. Normal orbits. IMPRESSION: Scattered punctate foci of acute ischemia within both cerebellar hemispheres,  left basal ganglia and both frontal lobes. Pattern is most consistent with emboli, most likely from a cardiac or proximal aortic source. No hemorrhage or mass effect. Electronically Signed   By: KeUlyses Jarred.D.   On: 08/07/2022 21:22   CT HEAD WO CONTRAST  Result Date: 08/07/2022 CLINICAL DATA:  Dizziness on Monday followed by vision changes on Wednesday EXAM: CT HEAD WITHOUT CONTRAST TECHNIQUE: Contiguous axial images were obtained from the base of the skull through the vertex without intravenous contrast. RADIATION DOSE REDUCTION: This exam was performed according to the departmental dose-optimization program which includes automated exposure control, adjustment of the mA and/or kV according to patient size and/or use of iterative reconstruction technique. COMPARISON:  Brain MRI 08/25/2019 FINDINGS: Brain: There is no acute intracranial hemorrhage, extra-axial fluid collection, or acute territorial infarct. Parenchymal volume is normal for age. The ventricles are normal in size. Gray-white differentiation is preserved. There is a small remote lacunar infarct in the left caudate head, new since the prior MRI from 2020. There is no mass lesion.  There is no mass effect or midline shift. Vascular: No hyperdense vessel or unexpected calcification. Skull: Normal. Negative for fracture or focal lesion. Sinuses/Orbits: The imaged paranasal sinuses are clear. Bilateral lens implants are in place. The globes  and orbits are otherwise unremarkable. Other: None. IMPRESSION: 1. No acute intracranial pathology. 2. Small infarct in the left caudate head is new since the brain MRI from 2020 but is remote in appearance. Otherwise, unremarkable for age head CT. Electronically Signed   By: Valetta Mole M.D.   On: 08/07/2022 16:52   CT CHEST ABDOMEN PELVIS W CONTRAST  Result Date: 08/07/2022 CLINICAL DATA:  History of endometrial cancer, rising CA 125. * Tracking Code: BO * EXAM: CT CHEST, ABDOMEN, AND PELVIS WITH  CONTRAST TECHNIQUE: Multidetector CT imaging of the chest, abdomen and pelvis was performed following the standard protocol during bolus administration of intravenous contrast. RADIATION DOSE REDUCTION: This exam was performed according to the departmental dose-optimization program which includes automated exposure control, adjustment of the mA and/or kV according to patient size and/or use of iterative reconstruction technique. CONTRAST:  82m OMNIPAQUE IOHEXOL 300 MG/ML  SOLN COMPARISON:  CT December 11, 2021. FINDINGS: CT CHEST FINDINGS Cardiovascular: Aortic atherosclerosis. Normal caliber thoracic aorta. No central pulmonary embolus on this nondedicated study. Coronary artery calcifications. Normal size heart. No significant pericardial effusion/thickening. Mediastinum/Nodes: No supraclavicular adenopathy. No discrete thyroid nodule. No pathologically enlarged mediastinal, hilar or axillary lymph nodes. Unchanged calcified nodal tissue in the subcarinal space. The esophagus is grossly unremarkable. Lungs/Pleura: Biapical pleuroparenchymal scarring. Bronchiectasis with peripheral airway impaction noted in the right lower lobe on image 93/5 with adjacent tree-in-bud nodules. Additional tree-in-bud nodularity in the right middle lobe, lingula and scattered throughout the right lower lobe. Similar ground-glass opacities in the left lower lobe for instance on image 111/5 measuring 7 mm and 95/5 measuring 5 mm, unchanged. No new suspicious pulmonary nodules or masses. No pleural effusion. No pneumothorax. Musculoskeletal: Biopsy clip in the right breast. No aggressive lytic or blastic lesion of bone. Multilevel degenerative changes spine. Diffuse demineralization of bone. CT ABDOMEN PELVIS FINDINGS Hepatobiliary: No suspicious hepatic lesion. Gallbladder is unremarkable. Common bile duct measures 5 mm, unchanged Pancreas: No pancreatic ductal dilation or evidence of acute inflammation. Spleen: Calcified hepatic  granulomata. Adrenals/Urinary Tract: Bilateral adrenal glands appear normal. No hydronephrosis. Kidneys demonstrate symmetric enhancement and excretion of contrast material. Urinary bladder is unremarkable for degree of distension Stomach/Bowel: Radiopaque enteric contrast material traverses the hepatic flexure. Stomach is unremarkable for degree of distension. No pathologic dilation of small or large bowel. Gas fluid levels throughout the colon suggestive of diarrheal illness. Vascular/Lymphatic: Aortic atherosclerosis. No pathologically enlarged abdominal or pelvic lymph nodes. Reproductive: Uterus is surgically absent, no soft tissue nodularity along the vaginal cuff. No suspicious adnexal mass Other: Mild mesenteric edema with some heterogeneity of the omental fat, similar prior. Trace fluid in the pelvis for instance a focal area of fluid in the posterior left hemipelvis on image 111/2. Mild pelvic peritoneal thickening. No discrete peritoneal or omental nodularity. Musculoskeletal: No aggressive lytic or blastic lesion of bone. Degenerative grade 1 L4 on L5 anterolisthesis. Multilevel degenerative changes spine. Diffuse demineralization of bone. IMPRESSION: 1. Status post hysterectomy without soft tissue nodularity along the vaginal cuff. 2. Trace pelvic free fluid and pelvic peritoneal thickening with similar mild mesenteric edema and heterogeneity of the omental fat. No discrete peritoneal or omental nodularity. 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 Consider attention on short-term interval follow-up imaging. 3. Bronchiectasis with peripheral airway impaction in the right lower lobe and adjacent tree-in-bud nodules as well as additional tree-in-bud nodularity in the right middle lobe, lingula and scattered throughout the right lower lobe. Findings are favored to reflect an atypical infectious or  inflammatory process such as atypical mycobacterial infection. 4. Stable  ground-glass opacities in the left lower lobe measuring up to 7 mm, nonspecific possibly reflecting an infectious or inflammatory process. Attention on follow-up imaging suggested. 5. Gas fluid levels throughout the colon as can be seen with diarrheal illness 6.  Aortic Atherosclerosis (ICD10-I70.0). Electronically Signed   By: Dahlia Bailiff M.D.   On: 08/07/2022 12:11            LOS: 2 days     Emeterio Reeve, DO [facility] 08/09/2022, 5:40 PM   Staff may message me via secure chat in [place]  but this may not receive immediate response,  please page for urgent matters!  If 7PM-7AM, please contact night-coverage [email or web address]  Dictation software was used to generate the above note. Typos may occur and escape review, as with typed/written notes. Please contact Dr Sheppard Coil directly for clarity if needed.

## 2022-08-09 NOTE — Hospital Course (Addendum)
81 year old female with medical history of breast and uterine cancer s/p chemotherapy 1 month ago, completed radiation therapy came to ED 08/07/2022 with complaints of sudden onset of dizziness that spontaneously resolved. She has been having intermittent episodes of dizziness over the past few weeks.  She also had transient episode of right arm weakness which completely resolved. She had a CT chest abdomen/pelvis done as outpatient ordered by oncology. CT head was unremarkable.  10/13: MRI brain showed scattered punctate foci of acute ischemia within the both cerebellar hemisphere likely consistent with emboli likely from cardiac or proximal aortic source. Admitted  10/14: Echo pending. BLE [place] no DVT. ASA '81mg'$  daily + plavix '75mg'$  daily x21 days f/b ASA '81mg'$  daily monotherapy after that. Cardiology - no invasive procedures recommended. CTA H/N as below full report (+) severe stenosis/occlusion concerns in L P1 w/ reconstitution and in proximal L P3, moderate stenosis distal basilar artery, mild bl carotid bifurcation atherosclerosis w/o significant stenosis.  10/15: Echocardiogram pending  - If no intracardiac clot, ok to d/c on cardiac monitor and DAPT in 90 days (d/t stenosis) then ASA 81 mg daily. 10/16: Echo no concerns, ok for discharge today   Consultants:  Neurology  Cardiology  Procedures: none      ASSESSMENT & PLAN:   Principal Problem:   Dizziness Active Problems:   Elevated troponin I measurement   Acid reflux   Iron deficiency anemia   H/O: GI bleed   Stroke Echocardiogram pending (delayed over the weekend, minimal staff to get echo done)  - If no intracardiac clot, ok to d/c w/ plan to place cardiac monitor and continue on DAPT for 90 days then ASA 81 mg daily. --> echo read today 08/10/22 no concerns   CTA head/neck concerning occlusions left P1 and P3 - ok to manage medically w/ longer course DAPT as above  Continue statin. Lipid profile showed LDL 94, increase the dose  of atorvastatin to 40 mg daily with a goal LDL less than 70 BP control  PT/OT consult - home health PT   NSTEMI Patient has significantly elevated troponin 748,877, 849. EKG showed ST depression in leads II, III, aVF, T wave inversions in V2 V3 and V4 Cardiology - no invasive procedures at this time    History of GI bleed Patient was taken off antiplatelet therapy by GI due to GI bleeding Continue Protonix 40 mg daily   History of breast/uterine cancer S/p chemotherapy and radiation treatment Outpatient f/u onc

## 2022-08-10 ENCOUNTER — Inpatient Hospital Stay
Admit: 2022-08-10 | Discharge: 2022-08-10 | Disposition: A | Discharge status: Home / Self Care | Payer: PPO | Attending: Internal Medicine | Admitting: Internal Medicine

## 2022-08-10 DIAGNOSIS — R42 Dizziness and giddiness: Secondary | ICD-10-CM | POA: Diagnosis not present

## 2022-08-10 DIAGNOSIS — I639 Cerebral infarction, unspecified: Secondary | ICD-10-CM | POA: Diagnosis not present

## 2022-08-10 LAB — ECHOCARDIOGRAM COMPLETE BUBBLE STUDY
AR max vel: 2.01 cm2
AV Area VTI: 1.98 cm2
AV Area mean vel: 2.02 cm2
AV Mean grad: 4 mmHg
AV Peak grad: 7.8 mmHg
Ao pk vel: 1.4 m/s
Area-P 1/2: 3.08 cm2
S' Lateral: 1.9 cm

## 2022-08-10 MED ORDER — ATORVASTATIN CALCIUM 40 MG PO TABS: 40.0000 mg | ORAL_TABLET | Freq: Every day | ORAL | 0 refills | Status: DC

## 2022-08-10 MED ORDER — ASCORBIC ACID 250 MG PO TABS: 250.0000 mg | ORAL_TABLET | Freq: Every day | ORAL | 0 refills | Status: DC

## 2022-08-10 MED ORDER — ASPIRIN 81 MG PO CHEW: 81.0000 mg | CHEWABLE_TABLET | Freq: Every day | ORAL | 0 refills | Status: DC

## 2022-08-10 MED ORDER — GABAPENTIN 400 MG PO CAPS: 400.0000 mg | ORAL_CAPSULE | Freq: Two times a day (BID) | ORAL | Status: DC

## 2022-08-10 MED ORDER — FERROUS FUMARATE 324 (106 FE) MG PO TABS: 1.0000 | ORAL_TABLET | Freq: Every day | ORAL | 0 refills | Status: DC

## 2022-08-10 MED ORDER — ATORVASTATIN CALCIUM 20 MG PO TABS: 40.0000 mg | ORAL_TABLET | Freq: Every day | ORAL | Status: DC

## 2022-08-10 MED ORDER — CLOPIDOGREL BISULFATE 75 MG PO TABS: 75.0000 mg | ORAL_TABLET | Freq: Every day | ORAL | 0 refills | Status: DC

## 2022-08-10 NOTE — Discharge Summary (Signed)
Physician Discharge Summary   Patient: Elizabeth Friedman MRN: [id number]  DOB: 1941/08/18   Admit:     Date of Admission: 08/07/2022 Admitted from: home   Discharge: Date of discharge: 08/10/22 Disposition: Home health Condition at discharge: good  CODE STATUS: DNR     Discharge Physician: Emeterio Reeve, DO [facility]     PCP: Adin Hector, MD  Recommendations for Outpatient Follow-up:  Follow up with PCP Tama High III, MD in 2-4 weeks Please obtain labs/tests: CMP and lipids in 4-6 weeks on higher dose atorvastatin  Please follow up on the following pending results: none Follow w/ cardiology re: cardiac event monitor (make appt for 1 week / ASAP) Follow w/ neurology in 8-12 weeks     Discharge Instructions     Diet - low sodium heart healthy   Complete by: As directed    Increase activity slowly   Complete by: As directed          Discharge Diagnoses: Principal Problem:   Dizziness Active Problems:   Elevated troponin I measurement   Acid reflux   Iron deficiency anemia   H/O: GI bleed       Hospital Course: 81 year old female with medical history of breast and uterine cancer s/p chemotherapy 1 month ago, completed radiation therapy came to ED 08/07/2022 with complaints of sudden onset of dizziness that spontaneously resolved. She has been having intermittent episodes of dizziness over the past few weeks.  She also had transient episode of right arm weakness which completely resolved. She had a CT chest abdomen/pelvis done as outpatient ordered by oncology. CT head was unremarkable.  10/13: MRI brain showed scattered punctate foci of acute ischemia within the both cerebellar hemisphere likely consistent with emboli likely from cardiac or proximal aortic source. Admitted  10/14: Echo pending. BLE [place] no DVT. ASA '81mg'$  daily + plavix '75mg'$  daily x21 days f/b ASA '81mg'$  daily monotherapy after that. Cardiology - no invasive procedures  recommended. CTA H/N as below full report (+) severe stenosis/occlusion concerns in L P1 w/ reconstitution and in proximal L P3, moderate stenosis distal basilar artery, mild bl carotid bifurcation atherosclerosis w/o significant stenosis.  10/15: Echocardiogram pending  - If no intracardiac clot, ok to d/c on cardiac monitor and DAPT in 90 days (d/t stenosis) then ASA 81 mg daily. 10/16: Echo no concerns, ok for discharge today   Consultants:  Neurology  Cardiology  Procedures: none      ASSESSMENT & PLAN:   Principal Problem:   Dizziness Active Problems:   Elevated troponin I measurement   Acid reflux   Iron deficiency anemia   H/O: GI bleed   Stroke Echocardiogram pending (delayed over the weekend, minimal staff to get echo done)  - If no intracardiac clot, ok to d/c w/ plan to place cardiac monitor and continue on DAPT for 90 days then ASA 81 mg daily. --> echo read today 08/10/22 no concerns   CTA head/neck concerning occlusions left P1 and P3 - ok to manage medically w/ longer course DAPT as above  Continue statin. Lipid profile showed LDL 94, increase the dose of atorvastatin to 40 mg daily with a goal LDL less than 70 BP control  PT/OT consult - home health PT   NSTEMI Patient has significantly elevated troponin 748,877, 849. EKG showed ST depression in leads II, III, aVF, T wave inversions in V2 V3 and V4 Cardiology - no invasive procedures at this time  History of GI bleed Patient was taken off antiplatelet therapy by GI due to GI bleeding Continue Protonix 40 mg daily   History of breast/uterine cancer S/p chemotherapy and radiation treatment Outpatient f/u onc             Discharge Instructions  Allergies as of 08/10/2022   No Known Allergies      Medication List     STOP taking these medications    simvastatin 10 MG tablet Commonly known as: ZOCOR   Vitron-C 65-125 MG Tabs Generic drug: Iron-Vitamin C       TAKE these  medications    acetaminophen 500 MG tablet Commonly known as: TYLENOL Take 1,000 mg by mouth every 6 (six) hours as needed for moderate pain or mild pain.   alum & mag hydroxide-simeth 200-200-20 MG/5ML suspension Commonly known as: MAALOX/MYLANTA Take 30 mLs by mouth every 6 (six) hours as needed for indigestion or heartburn.   ascorbic acid 250 MG tablet Commonly known as: VITAMIN C Take 1 tablet (250 mg total) by mouth daily. Start taking on: August 11, 2022   aspirin 81 MG chewable tablet Chew 1 tablet (81 mg total) by mouth daily. Start taking on: August 11, 2022   atorvastatin 40 MG tablet Commonly known as: LIPITOR Take 1 tablet (40 mg total) by mouth daily. Start taking on: August 11, 2022   CALTRATE 600 PLUS-VIT D PO Take 1 tablet by mouth in the morning.   Carboxymethylcellulose Sod PF 1 % Gel Place 1 drop into both eyes at bedtime.   celecoxib 200 MG capsule Commonly known as: CELEBREX Take 200 mg by mouth 2 (two) times daily.   clopidogrel 75 MG tablet Commonly known as: PLAVIX Take 1 tablet (75 mg total) by mouth daily. PLAVIX + ASPIRIN FOR 90 DAYS, THEN ASPIRIN ALONE INDEFINITELY Start taking on: August 11, 2022   Coenzyme Q10 200 MG Tabs Take 200 mg by mouth in the morning.   Ferrous Fumarate 324 (106 Fe) MG Tabs tablet Commonly known as: HEMOCYTE - 106 mg FE Take 1 tablet (106 mg of iron total) by mouth daily. Start taking on: August 11, 2022   gabapentin 400 MG capsule Commonly known as: NEURONTIN Take 1 capsule (400 mg total) by mouth 2 (two) times daily. What changed:  medication strength how much to take   magnesium hydroxide 400 MG/5ML suspension Commonly known as: MILK OF MAGNESIA Take by mouth daily as needed for mild constipation.   NASACORT ALLERGY 24HR NA Place 1 spray into the nose daily as needed (sinus congestion).   pantoprazole 40 MG tablet Commonly known as: PROTONIX Take 40 mg by mouth in the morning.   Restasis  0.05 % ophthalmic emulsion Generic drug: cycloSPORINE Place 1 drop into both eyes 2 (two) times daily.   topiramate 50 MG tablet Commonly known as: TOPAMAX Take 50 mg by mouth every evening.   VITAMIN B-12 PO Take 1 tablet by mouth in the morning.   Vitamin D 125 MCG (5000 UT) Caps Take 5,000 Units by mouth in the morning.   vitamin E 180 MG (400 UNITS) capsule Take 400 Units by mouth in the morning.         Follow-up Information     Adin Hector, MD. Go to.   Specialty: Internal Medicine Why: Hospital follow up visit with Dr. Caryl Comes on Monday, 08/17/22 at 9:15am Contact information: [facility] [place] [address] [phone]  Yolonda Kida, MD. Go to.   Specialties: Cardiology, Internal Medicine Why: Nurse visit for Holter Monitor Tues, 08/11/22 at 4:00pm at the [facility] Location of [facility] follow up on 08/19/22 @ 10:00am at the [facility] location of [facility] information: [place] [address] [phone]         Wonda Cerise, PA-C. Go on 08/12/2022.   Specialty: Physician Assistant Why: 10am for neurology appointment Contact information: [place] [address] [phone]         [facility], [facility] Follow up.   Specialty: Home Health Services Why: They will follow up with you for your home health needs. Contact information: [place] [address] [phone]                 No Known Allergies   Subjective: pt feels well this morning, no new weakness, headache, visoin change, dizziness. No CP/SOB   Discharge Exam: BP 134/77 (BP Location: Left Arm)   Pulse 65   Temp (!) 96.9 F (36.1 C) (Axillary)   Resp 18   Ht '5\' 2"'$  (1.575 m)   Wt 45.9 kg   SpO2 99%   BMI 18.51 kg/m  General: Pt is alert, awake, not in acute distress Cardiovascular: RRR, S1/S2 +, no rubs, no  gallops Respiratory: CTA bilaterally, no wheezing, no rhonchi Abdominal: Soft, NT, ND, bowel sounds + Extremities: no edema, no cyanosis     The results of significant diagnostics from this hospitalization (including imaging, microbiology, ancillary and laboratory) are listed below for reference.     Microbiology: Recent Results (from the past 240 hour(s))  Resp Panel by RT-PCR (Flu A&B, Covid) Anterior Nasal Swab     Status: None   Collection Time: 08/07/22  2:58 PM   Specimen: Anterior Nasal Swab  Result Value Ref Range Status   SARS Coronavirus 2 by RT PCR NEGATIVE NEGATIVE Final    Comment: (NOTE) SARS-CoV-2 target nucleic acids are NOT DETECTED.  The SARS-CoV-2 RNA is generally detectable in upper respiratory specimens during the acute phase of infection. The lowest concentration of SARS-CoV-2 viral copies this assay can detect is 138 copies/mL. A negative result does not preclude SARS-Cov-2 infection and should not be used as the sole basis for treatment or other patient management decisions. A negative result may occur with  improper specimen collection/handling, submission of specimen other than nasopharyngeal swab, presence of viral mutation(s) within the areas targeted by this assay, and inadequate number of viral copies(<138 copies/mL). A negative result must be combined with clinical observations, patient history, and epidemiological information. The expected result is Negative.  Fact Sheet for Patients:  EntrepreneurPulse.com.au  Fact Sheet for Healthcare Providers:  IncredibleEmployment.be  This test is no t yet approved or cleared by the [place] FDA and  has been authorized for detection and/or diagnosis of SARS-CoV-2 by FDA under an Emergency Use Authorization (EUA). This EUA will remain  in effect (meaning this test can be used) for the duration of the COVID-19 declaration under Section 564(b)(1) of the Act,  21 U.S.C.section 360bbb-3(b)(1), unless the authorization is terminated  or revoked sooner.       Influenza A by PCR NEGATIVE NEGATIVE Final   Influenza B by PCR NEGATIVE NEGATIVE Final    Comment: (NOTE) The Xpert Xpress SARS-CoV-2/FLU/RSV plus assay is intended as an aid in the diagnosis of influenza from Nasopharyngeal swab specimens and should not be used as a sole  basis for treatment. Nasal washings and aspirates are unacceptable for Xpert Xpress SARS-CoV-2/FLU/RSV testing.  Fact Sheet for Patients: EntrepreneurPulse.com.au  Fact Sheet for Healthcare Providers: IncredibleEmployment.be  This test is not yet approved or cleared by the [place] FDA and has been authorized for detection and/or diagnosis of SARS-CoV-2 by FDA under an Emergency Use Authorization (EUA). This EUA will remain in effect (meaning this test can be used) for the duration of the COVID-19 declaration under Section 564(b)(1) of the Act, 21 U.S.C. section 360bbb-3(b)(1), unless the authorization is terminated or revoked.  Performed at [facility], [address] [place], [place] [address]      Labs: BNP (last 3 results) Recent Labs    08/07/22 1458  BNP 17.4   Basic Metabolic Panel: Recent Labs  Lab 08/05/22 1042 08/07/22 1458  NA 130* 135  K 4.3 4.0  CL 99 103  CO2 25 24  GLUCOSE 96 117*  BUN 14 15  CREATININE 0.92 0.79  CALCIUM 8.9 9.5   Liver Function Tests: Recent Labs  Lab 08/05/22 1042 08/07/22 1458  AST 27 25  ALT 20 18  ALKPHOS 99 92  BILITOT 0.4 0.6  PROT 7.0 7.2  ALBUMIN 4.2 4.3   No results for input(s): "LIPASE", "AMYLASE" in the last 168 hours. No results for input(s): "AMMONIA" in the last 168 hours. CBC: Recent Labs  Lab 08/05/22 1042 08/07/22 1458  WBC 4.7 5.5  NEUTROABS 3.3 4.3  HGB 12.2 12.0  HCT 34.5* 35.6*  MCV 96.6 98.6  PLT 109* 121*   Cardiac Enzymes: No results for input(s): "CKTOTAL",  "CKMB", "CKMBINDEX", "TROPONINI" in the last 168 hours. BNP: Invalid input(s): "POCBNP" CBG: No results for input(s): "GLUCAP" in the last 168 hours. D-Dimer No results for input(s): "DDIMER" in the last 72 hours. Hgb A1c Recent Labs    08/08/22 0011  HGBA1C 5.7*   Lipid Profile Recent Labs    08/08/22 0011  CHOL 171  HDL 45  LDLCALC 94  TRIG 162*  CHOLHDL 3.8   Thyroid function studies No results for input(s): "TSH", "T4TOTAL", "T3FREE", "THYROIDAB" in the last 72 hours.  Invalid input(s): "FREET3" Anemia work up No results for input(s): "VITAMINB12", "FOLATE", "FERRITIN", "TIBC", "IRON", "RETICCTPCT" in the last 72 hours. Urinalysis    Component Value Date/Time   COLORURINE STRAW (A) 01/22/2022 1625   APPEARANCEUR CLEAR (A) 01/22/2022 1625   LABSPEC 1.006 01/22/2022 1625   PHURINE 7.0 01/22/2022 1625   GLUCOSEU NEGATIVE 01/22/2022 1625   HGBUR SMALL (A) 01/22/2022 1625   BILIRUBINUR NEGATIVE 01/22/2022 1625   BILIRUBINUR Negative 12/19/2021 0821   KETONESUR NEGATIVE 01/22/2022 1625   PROTEINUR NEGATIVE 01/22/2022 1625   UROBILINOGEN 0.2 12/19/2021 0821   NITRITE NEGATIVE 01/22/2022 1625   LEUKOCYTESUR NEGATIVE 01/22/2022 1625   Sepsis Labs Recent Labs  Lab 08/05/22 1042 08/07/22 1458  WBC 4.7 5.5   Microbiology Recent Results (from the past 240 hour(s))  Resp Panel by RT-PCR (Flu A&B, Covid) Anterior Nasal Swab     Status: None   Collection Time: 08/07/22  2:58 PM   Specimen: Anterior Nasal Swab  Result Value Ref Range Status   SARS Coronavirus 2 by RT PCR NEGATIVE NEGATIVE Final    Comment: (NOTE) SARS-CoV-2 target nucleic acids are NOT DETECTED.  The SARS-CoV-2 RNA is generally detectable in upper respiratory specimens during the acute phase of infection. The lowest concentration of SARS-CoV-2 viral copies this assay can detect is 138 copies/mL. A negative result does not preclude SARS-Cov-2  infection and should not be used as the sole basis for  treatment or other patient management decisions. A negative result may occur with  improper specimen collection/handling, submission of specimen other than nasopharyngeal swab, presence of viral mutation(s) within the areas targeted by this assay, and inadequate number of viral copies(<138 copies/mL). A negative result must be combined with clinical observations, patient history, and epidemiological information. The expected result is Negative.  Fact Sheet for Patients:  EntrepreneurPulse.com.au  Fact Sheet for Healthcare Providers:  IncredibleEmployment.be  This test is no t yet approved or cleared by the [place] FDA and  has been authorized for detection and/or diagnosis of SARS-CoV-2 by FDA under an Emergency Use Authorization (EUA). This EUA will remain  in effect (meaning this test can be used) for the duration of the COVID-19 declaration under Section 564(b)(1) of the Act, 21 U.S.C.section 360bbb-3(b)(1), unless the authorization is terminated  or revoked sooner.       Influenza A by PCR NEGATIVE NEGATIVE Final   Influenza B by PCR NEGATIVE NEGATIVE Final    Comment: (NOTE) The Xpert Xpress SARS-CoV-2/FLU/RSV plus assay is intended as an aid in the diagnosis of influenza from Nasopharyngeal swab specimens and should not be used as a sole basis for treatment. Nasal washings and aspirates are unacceptable for Xpert Xpress SARS-CoV-2/FLU/RSV testing.  Fact Sheet for Patients: EntrepreneurPulse.com.au  Fact Sheet for Healthcare Providers: IncredibleEmployment.be  This test is not yet approved or cleared by the [place] FDA and has been authorized for detection and/or diagnosis of SARS-CoV-2 by FDA under an Emergency Use Authorization (EUA). This EUA will remain in effect (meaning this test can be used) for the duration of the COVID-19 declaration under Section 564(b)(1) of the Act, 21  U.S.C. section 360bbb-3(b)(1), unless the authorization is terminated or revoked.  Performed at [facility], [address] [place], [place] [address]    Imaging ECHOCARDIOGRAM COMPLETE BUBBLE STUDY  Result Date: 08/10/2022    ECHOCARDIOGRAM REPORT   Patient Name:   Elizabeth Friedman Date of Exam: 08/10/2022 Medical Rec #:  [id number]    Height:       62.0 in Accession #:    [id number]   Weight:       101.2 lb Date of Birth:  October 07, 1941   BSA:          1.431 m Patient Age:    45 years     BP:           134/77 mmHg Patient Gender: F            HR:           65 bpm. Exam Location:  [facility] Procedure: 2D Echo, Color Doppler, Cardiac Doppler and Saline Contrast Bubble            Study Indications:     Stroke 434.91 / I63.9  History:         Patient has prior history of Echocardiogram examinations, most                  recent 12/22/2021. Risk Factors:Dyslipidemia. Migraines.  Sonographer:     Sherrie Sport Referring Phys:  [place] Diagnosing Phys: Serafina Royals MD IMPRESSIONS  1. Left ventricular ejection fraction, by estimation, is 60 to 65%. The left ventricle has normal function. The left ventricle has no regional wall motion abnormalities. Left ventricular diastolic parameters were normal.  2. Right ventricular systolic function is normal. The right ventricular size is  normal.  3. The mitral valve is normal in structure. Mild mitral valve regurgitation.  4. The aortic valve is normal in structure. Aortic valve regurgitation is not visualized. FINDINGS  Left Ventricle: Left ventricular ejection fraction, by estimation, is 60 to 65%. The left ventricle has normal function. The left ventricle has no regional wall motion abnormalities. The left ventricular internal cavity size was normal in size. There is  no left ventricular hypertrophy. Left ventricular diastolic parameters were normal. Right Ventricle: The right ventricular size is normal. No increase in right ventricular wall thickness.  Right ventricular systolic function is normal. Left Atrium: Left atrial size was normal in size. Right Atrium: Right atrial size was normal in size. Pericardium: There is no evidence of pericardial effusion. Mitral Valve: The mitral valve is normal in structure. Mild mitral valve regurgitation. Tricuspid Valve: The tricuspid valve is normal in structure. Tricuspid valve regurgitation is mild. Aortic Valve: The aortic valve is normal in structure. Aortic valve regurgitation is not visualized. Aortic valve mean gradient measures 4.0 mmHg. Aortic valve peak gradient measures 7.8 mmHg. Aortic valve area, by VTI measures 1.98 cm. Pulmonic Valve: The pulmonic valve was normal in structure. Pulmonic valve regurgitation is trivial. Aorta: The aortic root and ascending aorta are structurally normal, with no evidence of dilitation. IAS/Shunts: No atrial level shunt detected by color flow Doppler. Agitated saline contrast was given intravenously to evaluate for intracardiac shunting.  LEFT VENTRICLE PLAX 2D LVIDd:         3.20 cm   Diastology LVIDs:         1.90 cm   LV e' medial:    5.66 cm/s LV PW:         0.60 cm   LV E/e' medial:  15.4 LV IVS:        1.00 cm   LV e' lateral:   8.92 cm/s LVOT diam:     1.90 cm   LV E/e' lateral: 9.8 LV SV:         58 LV SV Index:   40 LVOT Area:     2.84 cm  RIGHT VENTRICLE RV Basal diam:  3.80 cm RV Mid diam:    3.50 cm RV S prime:     19.50 cm/s TAPSE (M-mode): 2.8 cm LEFT ATRIUM           Index        RIGHT ATRIUM           Index LA diam:      2.40 cm 1.68 cm/m   RA Area:     10.20 cm LA Vol (A2C): 33.6 ml 23.48 ml/m  RA Volume:   20.70 ml  14.46 ml/m LA Vol (A4C): 11.4 ml 7.97 ml/m  AORTIC VALVE AV Area (Vmax):    2.01 cm AV Area (Vmean):   2.02 cm AV Area (VTI):     1.98 cm AV Vmax:           139.50 cm/s AV Vmean:          91.400 cm/s AV VTI:            0.291 m AV Peak Grad:      7.8 mmHg AV Mean Grad:      4.0 mmHg LVOT Vmax:         99.00 cm/s LVOT Vmean:        65.000 cm/s  LVOT VTI:          0.203 m LVOT/AV VTI ratio: 0.70  AORTA Ao Root diam: 3.20 cm MITRAL VALVE               TRICUSPID VALVE MV Area (PHT): 3.08 cm    TR Peak grad:   17.1 mmHg MV Decel Time: 246 msec    TR Vmax:        207.00 cm/s MV E velocity: 87.00 cm/s MV A velocity: 89.60 cm/s  SHUNTS MV E/A ratio:  0.97        Systemic VTI:  0.20 m                            Systemic Diam: 1.90 cm Serafina Royals MD Electronically signed by Serafina Royals MD Signature Date/Time: 08/10/2022/9:02:45 AM    Final       Time coordinating discharge: over 30 minutes  SIGNED:  Emeterio Reeve DO [facility]

## 2022-08-10 NOTE — Progress Notes (Signed)
Discharge teaching complete. Meds, diet, activity, follow up appointments, stroke symptoms reviewed and all questions answered. Copy of instructions given to patient and prescriptions sent to the pharmacy. Son present during teaching. Discharged home via wheelchair with son.

## 2022-08-10 NOTE — Care Management Important Message (Signed)
Important Message  Patient Details  Name: Elizabeth Friedman MRN: [id number] Date of Birth: 1940/11/27   Medicare Important Message Given:  Yes     Dannette Barbara 08/10/2022, 12:57 PM

## 2022-08-10 NOTE — Progress Notes (Signed)
          CROSS COVER NOTE  NAME: Elizabeth Friedman MRN: [id number] DOB : 30-Nov-1940    Called to bedside by nursing staff who report code status has not been addressed with patient this admission. Code status is listed as PRIOR in chart and Elizabeth Friedman confirms code status was not discussed with her this admission. On review of chart she has previously been documented as "full code". Elizabeth Friedman is alert and oriented and has full decision-making capacity, no administration of sedating medications this evening or tonight. Patient articulated desire to be DNR. She reports she has had conversations with her family and they are aware of her desire to be DNR. Reviewed with patient her desire to NOT be shocked, intubated, or receive ACLS protocol in the event of a cardiac or respiratory arrest. I restated that in the event of a cardiac or respiratory arrest we will not initiate resuscitative efforts. Elizabeth Friedman again confirmed this is her desire. Patient code status updated in chart to reflect DO NOT RESUSCITATE   This document was prepared using Dragon voice recognition software and may include unintentional dictation errors.  Neomia Glass DNP, MBA, FNP-BC Nurse Practitioner [facility] Pager [phone]

## 2022-08-10 NOTE — TOC Transition Note (Signed)
Transition of Care [facility]) - CM/SW Discharge Note   Patient Details  Name: Elizabeth Friedman MRN: [id number] Date of Birth: 1941/03/28  Transition of Care [facility]) CM/SW Contact:  Candie Chroman, LCSW Phone Number: 08/10/2022, 11:31 AM   Clinical Narrative: Patient has orders to discharge home today. CSW met with patient. Son at bedside. CSW introduced role and explained that therapy recommendations would be discussed. Patient and son are agreeable to home health. [facility] is one of top preferences and they are able to accept. Asked MD to add OT to home health order. OT recommending 3-in-1. Patient and son declining at this time. She does have a built-in shower chair. Showed them picture of rollator that [employer] provides. Son prefers to order one of [place] that is more light-weight. No further concerns. CSW signing off.  Final next level of care: Home w Home Health Services Barriers to Discharge: No Barriers Identified   Patient Goals and CMS Choice   CMS Medicare.gov Compare Post Acute Care list provided to:: Patient (Son at bedside) Choice offered to / list presented to : Patient, Adult Children  Discharge Placement                Patient to be transferred to facility by: Son Name of family member notified: Elizabeth Friedman Patient and family notified of of transfer: 08/10/22  Discharge Plan and Services     Post Acute Care Choice: Home Health                    HH Arranged: PT, OT [facility] Agency: [place] Date [place]: 08/10/22   Representative spoke with at [place]: Adela Lank  Social Determinants of Health (SDOH) Interventions Food Insecurity Interventions: Intervention Not Indicated Housing Interventions: Intervention Not Indicated Transportation Interventions: Intervention Not Indicated Utilities Interventions: Intervention Not Indicated   Readmission Risk Interventions     No data to display

## 2022-08-10 NOTE — Progress Notes (Signed)
Progress Note  81 yo woman with hx breast and uterine cancer who presented with vertigo and was found to have multifocal acute ischemic strokes anterior and posterior circulations bilaterally c/w central embolic source on MRI (personal review).   S: Dizziness continues to improve. She has no other focal neuro deficits.   Data:  MRI brain wwo  Scattered punctate foci of acute ischemia within both cerebellar hemispheres, left basal ganglia and both frontal lobes. Pattern is most consistent with emboli, most likely from a cardiac or proximal aortic source. No hemorrhage or mass effect.  CTA H&N  1. Short segment severe stenosis/occlusion of the left P1 segment with reconstitution. 2. Severe stenosis or short segment occlusion of the proximal left P3 segment. 3. Moderate stenosis of the distal basilar artery. 4. 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 5. Mild bilateral carotid bifurcation atherosclerosis without hemodynamically significant stenosis.  CNS imaging personally reviewed; I agree with above interpretations  TTE pending  BLE [place] - no DVT  Stroke Labs     Component Value Date/Time   CHOL 171 08/08/2022 0011   TRIG 162 (H) 08/08/2022 0011   HDL 45 08/08/2022 0011   CHOLHDL 3.8 08/08/2022 0011   VLDL 32 08/08/2022 0011   LDLCALC 94 08/08/2022 0011    Lab Results  Component Value Date/Time   HGBA1C 5.7 (H) 08/08/2022 12:11 AM   O:  Vitals:   08/10/22 0543 08/10/22 0716  BP: (!) 144/81 134/77  Pulse: 78 65  Resp: 18 18  Temp: 97.7 F (36.5 C)   SpO2: 100% 99%   Physical Exam Gen: A&O x4, NAD HEENT: Atraumatic, normocephalic;mucous membranes moist; oropharynx clear, tongue without atrophy or fasciculations. Neck: Supple, trachea midline. Resp: CTAB, no w/r/r CV: RRR, no m/g/r; nml S1 and S2. 2+ symmetric peripheral pulses. Abd: soft/NT/ND; nabs x 4 quad Extrem: Nml bulk; no cyanosis, clubbing, or edema.   Neuro: *MS: A&O x4. Follows  multi-step commands.  *Speech: fluid, nondysarthric, able to name and repeat *CN:    I: Deferred   II,III: PERRLA, VFF by confrontation, optic discs unable to be visualized 2/2 pupillary constriction   III,IV,VI: EOMI w/o nystagmus, no ptosis   V: Sensation intact from V1 to V3 to LT   VII: Eyelid closure was full.  Smile symmetric.   VIII: Hearing intact to voice   IX,X: Voice normal, palate elevates symmetrically    XI: SCM/trap 5/5 bilat   XII: Tongue protrudes midline, no atrophy or fasciculations    *Motor:   Normal bulk.  No tremor, rigidity or bradykinesia. No pronator drift.     Strength: Dlt Bic Tri WrE WrF FgS Gr HF KnF KnE PlF DoF    Left '5 5 5 5 5 5 5 5 5 5 5 5    '$ Right '5 5 5 5 5 5 5 5 5 5 5 5      '$ *Sensory: Impaired to LT and PP below the knee, patient says is chronic 2/2 chemo. Symmetric. Propioception intact bilat.  No double-simultaneous extinction.  *Coordination:  FNF intact bilat *Reflexes:  2+ and symmetric throughout without clonus; toes down-going bilat *Gait: deferred   NIHSS 0   Premorbid mRS = 1  A/P: 81 yo woman with hx breast and uterine cancer who presented with vertigo and was found to have multifocal acute ischemic strokes anterior and posterior circulations bilaterally c/w central embolic source. TTE with bubble is pending. BLE [place] neg for DVT. If TTE does not show intracardiac  clot she will need to be discharged with an ambulatory cardiac monitor. Given her age and frailty I do not recommend TEE at this time.  - F/u TTE. If TTE does not show intracardiac clot she will need to be discharged with an ambulatory cardiac monitor.  - Goal normotension, avoid hypotension - ASA '81mg'$  daily + plavix '75mg'$  daily x90 days (2/2 severe intracranial stenosis) f/b ASA '81mg'$  daily monotherapy after that - q4 hr neuro checks - STAT head CT for any change in neuro exam - Tele - PT/OT/SLP - Stroke education - Amb referral to neurology upon discharge  - Will f/u TTE  results  Su Monks, MD [facility] [phone]  If 7pm- 7am, please page neurology on call as listed in [place].

## 2022-08-10 NOTE — Progress Notes (Signed)
*  PRELIMINARY RESULTS* Echocardiogram 2D Echocardiogram has been performed.  Elizabeth Friedman 08/10/2022, 7:54 AM

## 2022-08-10 NOTE — Progress Notes (Addendum)
Neurology Progress Note   S:// Seen and examined.  No acute changes.   O:// Current vital signs: BP 134/77 (BP Location: Left Arm)   Pulse 65   Temp (!) 96.9 F (36.1 C) (Axillary)   Resp 18   Ht '5\' 2"'$  (1.575 m)   Wt 45.9 kg   SpO2 99%   BMI 18.51 kg/m  Vital signs in last 24 hours: Temp:  [96.9 F (36.1 C)-98.8 F (37.1 C)] 96.9 F (36.1 C) (10/16 0716) Pulse Rate:  [65-86] 65 (10/16 0716) Resp:  [18] 18 (10/16 0716) BP: (122-144)/(57-82) 134/77 (10/16 0716) SpO2:  [96 %-100 %] 99 % (10/16 0716) General: Awake alert in no distress HNT: Normocephalic atraumatic Lungs: Clear Cardiovascular: Regular rhythm Neurological examination awake alert oriented x3 No dysarthria No evidence of aphasia Cranial nerves II through 12 intact Motor examination with no drift in any of the 4 extremities Sensation intact light touch Coordination examination with no dysmetria NIH stroke scale-0  Medications  Current Facility-Administered Medications:    acetaminophen (TYLENOL) tablet 1,000 mg, 1,000 mg, Oral, Q6H PRN, Florina Ou V, MD, 1,000 mg at 08/09/22 2138   alum & mag hydroxide-simeth (MAALOX/MYLANTA) 200-200-20 MG/5ML suspension 30 mL, 30 mL, Oral, Q6H PRN, Para Skeans, MD   Ferrous Fumarate (HEMOCYTE - 106 mg FE) tablet 106 mg of iron, 1 tablet, Oral, Daily, 106 mg of iron at 08/10/22 9470 **AND** ascorbic acid (VITAMIN C) tablet 250 mg, 250 mg, Oral, Daily, Emeterio Reeve, DO, 250 mg at 08/10/22 0830   aspirin chewable tablet 81 mg, 81 mg, Oral, Daily, Florina Ou V, MD, 81 mg at 08/10/22 0830   [START ON 08/11/2022] atorvastatin (LIPITOR) tablet 40 mg, 40 mg, Oral, Daily, Emeterio Reeve, DO   cholecalciferol (VITAMIN D3) 25 MCG (1000 UNIT) tablet 5,000 Units, 5,000 Units, Oral, q AM, Para Skeans, MD, 5,000 Units at 08/10/22 0601   clopidogrel (PLAVIX) tablet 75 mg, 75 mg, Oral, Daily, Derek Jack, MD, 75 mg at 08/10/22 0831   cycloSPORINE (RESTASIS) 0.05 %  ophthalmic emulsion 1 drop, 1 drop, Both Eyes, BID, Emeterio Reeve, DO, 1 drop at 08/09/22 2139   gabapentin (NEURONTIN) capsule 300 mg, 300 mg, Oral, BID, Florina Ou V, MD, 300 mg at 08/10/22 9628   hydrALAZINE (APRESOLINE) injection 10 mg, 10 mg, Intravenous, Q6H PRN, Florina Ou V, MD, 10 mg at 08/08/22 1327   magnesium hydroxide (MILK OF MAGNESIA) suspension 30 mL, 30 mL, Oral, QHS PRN, Sharion Settler, NP, 30 mL at 08/09/22 2138   nitroGLYCERIN (NITROSTAT) SL tablet 0.4 mg, 0.4 mg, Sublingual, Q5 min PRN, Para Skeans, MD   Oral care mouth rinse, 15 mL, Mouth Rinse, PRN, Para Skeans, MD   oxyCODONE-acetaminophen (PERCOCET/ROXICET) 5-325 MG per tablet 1 tablet, 1 tablet, Oral, Q8H PRN, Emeterio Reeve, DO   pantoprazole (PROTONIX) EC tablet 40 mg, 40 mg, Oral, q AM, Florina Ou V, MD, 40 mg at 08/10/22 0603   sodium chloride flush (NS) 0.9 % injection 3 mL, 3 mL, Intravenous, Once, Vanessa Genesee, MD   topiramate (TOPAMAX) tablet 50 mg, 50 mg, Oral, QPM, Florina Ou V, MD, 50 mg at 08/09/22 1724  Labs CBC    Component Value Date/Time   WBC 5.5 08/07/2022 1458   RBC 3.61 (L) 08/07/2022 1458   HGB 12.0 08/07/2022 1458   HGB 11.8 (L) 05/22/2020 0804   HCT 35.6 (L) 08/07/2022 1458   PLT 121 (L) 08/07/2022 1458   PLT 223 05/22/2020 0804  MCV 98.6 08/07/2022 1458   MCH 33.2 08/07/2022 1458   MCHC 33.7 08/07/2022 1458   RDW 11.6 08/07/2022 1458   LYMPHSABS 0.7 08/07/2022 1458   MONOABS 0.4 08/07/2022 1458   EOSABS 0.0 08/07/2022 1458   BASOSABS 0.0 08/07/2022 1458    CMP     Component Value Date/Time   NA 135 08/07/2022 1458   K 4.0 08/07/2022 1458   CL 103 08/07/2022 1458   CO2 24 08/07/2022 1458   GLUCOSE 117 (H) 08/07/2022 1458   BUN 15 08/07/2022 1458   CREATININE 0.79 08/07/2022 1458   CREATININE 1.03 (H) 05/22/2020 0804   CALCIUM 9.5 08/07/2022 1458   PROT 7.2 08/07/2022 1458   ALBUMIN 4.3 08/07/2022 1458   AST 25 08/07/2022 1458   AST 17 05/22/2020 0804    ALT 18 08/07/2022 1458   ALT 11 05/22/2020 0804   ALKPHOS 92 08/07/2022 1458   BILITOT 0.6 08/07/2022 1458   BILITOT 0.3 05/22/2020 0804   GFRNONAA >60 08/07/2022 1458   GFRNONAA 52 (L) 05/22/2020 0804   GFRAA 60 (L) 05/22/2020 0804    glycosylated hemoglobin-5.7  Lipid Panel     Component Value Date/Time   CHOL 171 08/08/2022 0011   TRIG 162 (H) 08/08/2022 0011   HDL 45 08/08/2022 0011   CHOLHDL 3.8 08/08/2022 0011   VLDL 32 08/08/2022 0011   LDLCALC 94 08/08/2022 0011  LDL 94   Imaging I have reviewed images in epic and the results pertinent to this consultation are: MRI brain with scattered punctate foci of acute ischemia within both cerebellar hemisphere, left basal ganglia and both frontal lobe-pattern consistent with an embolic process. CT angio head and neck with short segment severe stenosis/occlusion of the left P1 segment with reconstitution.  Severe stenosis or short segment occlusion of the proximal left P3.  Moderate stenosis of the distal basilar artery.  Mild bilateral carotid bifurcation atherosclerosis without significant stenosis. Lower extremity venous Dopplers-no evidence of DVT  2D echocardiogram with LVEF 60 to 65%, mitral valve normal, aortic valve normal, left atrium size normal, no atrial level shunt with bubble study.    Assessment: 81 year old past history of breast and uterine cancer presenting with vertigo and found to have multifocal acute ischemic strokes in anterior and posterior circulation bilaterally consistent with central embolic source. Transthoracic echo unremarkable, no evidence of lower extremity DVT. Etiology remains cryptogenic-differentials include paroxysmal atrial fibrillation versus hypercoagulability from cancer versus intracranial atherosclerosis (multiple short segment posterior circulation stenoses but relatively clean anterior circulation).  Impression: Acute ischemic stroke-cryptogenic etiology with differentials as  above.  Recommendations: Continue aspirin 81 Plavix 75 for 90 days followed by aspirin only. Continue atorvastatin 40 mg-goal LDL less than 70. A1c at goal Will need cardiac monitoring to rule out paroxysmal atrial fibrillation Continue discussions with oncology regarding her cancer treatment.  I would also recommend discussing potential hypercoagulability with oncology as outpatient.  If there is concern for hypercoagulability from malignancy, stroke prevention at that time would entail anticoagulation rather than antiplatelet. Follow-up with outpatient neurology clinic in 8 to 12 weeks.  Patient would prefer [facility] Neurology follow up Follow-up PT OT recommendations  Plan relayed to Dr. Sheppard Coil via secure chat. -- Amie [place], MD Neurologist [facility] Pager: [phone]

## 2022-08-11 ENCOUNTER — Ambulatory Visit: Payer: PPO

## 2022-08-11 DIAGNOSIS — R079 Chest pain, unspecified: Secondary | ICD-10-CM | POA: Diagnosis not present

## 2022-08-12 DIAGNOSIS — Z8673 Personal history of transient ischemic attack (TIA), and cerebral infarction without residual deficits: Secondary | ICD-10-CM | POA: Diagnosis not present

## 2022-08-12 DIAGNOSIS — R531 Weakness: Secondary | ICD-10-CM | POA: Diagnosis not present

## 2022-08-13 ENCOUNTER — Inpatient Hospital Stay
Admission: EM | Admit: 2022-08-13 | Discharge: 2022-08-20 | DRG: 064 | Disposition: A | Discharge status: Rehabilitation Facility | Payer: PPO | Attending: Student | Admitting: Student

## 2022-08-13 ENCOUNTER — Emergency Department: Payer: PPO

## 2022-08-13 ENCOUNTER — Other Ambulatory Visit: Payer: Self-pay

## 2022-08-13 DIAGNOSIS — Z8542 Personal history of malignant neoplasm of other parts of uterus: Secondary | ICD-10-CM | POA: Diagnosis not present

## 2022-08-13 DIAGNOSIS — J479 Bronchiectasis, uncomplicated: Secondary | ICD-10-CM | POA: Diagnosis present

## 2022-08-13 DIAGNOSIS — G8194 Hemiplegia, unspecified affecting left nondominant side: Secondary | ICD-10-CM | POA: Diagnosis present

## 2022-08-13 DIAGNOSIS — G43909 Migraine, unspecified, not intractable, without status migrainosus: Secondary | ICD-10-CM

## 2022-08-13 DIAGNOSIS — R29898 Other symptoms and signs involving the musculoskeletal system: Secondary | ICD-10-CM | POA: Diagnosis not present

## 2022-08-13 DIAGNOSIS — K219 Gastro-esophageal reflux disease without esophagitis: Secondary | ICD-10-CM

## 2022-08-13 DIAGNOSIS — Z681 Body mass index (BMI) 19 or less, adult: Secondary | ICD-10-CM

## 2022-08-13 DIAGNOSIS — I69398 Other sequelae of cerebral infarction: Secondary | ICD-10-CM | POA: Diagnosis not present

## 2022-08-13 DIAGNOSIS — C55 Malignant neoplasm of uterus, part unspecified: Secondary | ICD-10-CM | POA: Diagnosis not present

## 2022-08-13 DIAGNOSIS — I6340 Cerebral infarction due to embolism of unspecified cerebral artery: Principal | ICD-10-CM | POA: Diagnosis present

## 2022-08-13 DIAGNOSIS — E871 Hypo-osmolality and hyponatremia: Secondary | ICD-10-CM | POA: Diagnosis present

## 2022-08-13 DIAGNOSIS — J8410 Pulmonary fibrosis, unspecified: Secondary | ICD-10-CM | POA: Diagnosis not present

## 2022-08-13 DIAGNOSIS — E538 Deficiency of other specified B group vitamins: Secondary | ICD-10-CM | POA: Diagnosis not present

## 2022-08-13 DIAGNOSIS — R42 Dizziness and giddiness: Principal | ICD-10-CM

## 2022-08-13 DIAGNOSIS — Z791 Long term (current) use of non-steroidal anti-inflammatories (NSAID): Secondary | ICD-10-CM

## 2022-08-13 DIAGNOSIS — I7300 Raynaud's syndrome without gangrene: Secondary | ICD-10-CM | POA: Diagnosis present

## 2022-08-13 DIAGNOSIS — R531 Weakness: Secondary | ICD-10-CM | POA: Diagnosis not present

## 2022-08-13 DIAGNOSIS — I6381 Other cerebral infarction due to occlusion or stenosis of small artery: Secondary | ICD-10-CM | POA: Diagnosis not present

## 2022-08-13 DIAGNOSIS — R4781 Slurred speech: Secondary | ICD-10-CM | POA: Diagnosis present

## 2022-08-13 DIAGNOSIS — Z9221 Personal history of antineoplastic chemotherapy: Secondary | ICD-10-CM

## 2022-08-13 DIAGNOSIS — Z8249 Family history of ischemic heart disease and other diseases of the circulatory system: Secondary | ICD-10-CM | POA: Diagnosis not present

## 2022-08-13 DIAGNOSIS — Z853 Personal history of malignant neoplasm of breast: Secondary | ICD-10-CM

## 2022-08-13 DIAGNOSIS — Z7902 Long term (current) use of antithrombotics/antiplatelets: Secondary | ICD-10-CM | POA: Diagnosis not present

## 2022-08-13 DIAGNOSIS — Z85828 Personal history of other malignant neoplasm of skin: Secondary | ICD-10-CM

## 2022-08-13 DIAGNOSIS — I639 Cerebral infarction, unspecified: Secondary | ICD-10-CM | POA: Diagnosis not present

## 2022-08-13 DIAGNOSIS — I6389 Other cerebral infarction: Secondary | ICD-10-CM | POA: Diagnosis not present

## 2022-08-13 DIAGNOSIS — Z8673 Personal history of transient ischemic attack (TIA), and cerebral infarction without residual deficits: Secondary | ICD-10-CM | POA: Diagnosis not present

## 2022-08-13 DIAGNOSIS — Z7982 Long term (current) use of aspirin: Secondary | ICD-10-CM | POA: Diagnosis not present

## 2022-08-13 DIAGNOSIS — Z66 Do not resuscitate: Secondary | ICD-10-CM | POA: Diagnosis present

## 2022-08-13 DIAGNOSIS — Z79899 Other long term (current) drug therapy: Secondary | ICD-10-CM

## 2022-08-13 DIAGNOSIS — I214 Non-ST elevation (NSTEMI) myocardial infarction: Secondary | ICD-10-CM | POA: Diagnosis not present

## 2022-08-13 DIAGNOSIS — D649 Anemia, unspecified: Secondary | ICD-10-CM | POA: Diagnosis present

## 2022-08-13 DIAGNOSIS — R519 Headache, unspecified: Secondary | ICD-10-CM | POA: Diagnosis not present

## 2022-08-13 DIAGNOSIS — M47812 Spondylosis without myelopathy or radiculopathy, cervical region: Secondary | ICD-10-CM | POA: Diagnosis not present

## 2022-08-13 DIAGNOSIS — M1990 Unspecified osteoarthritis, unspecified site: Secondary | ICD-10-CM | POA: Diagnosis not present

## 2022-08-13 DIAGNOSIS — R918 Other nonspecific abnormal finding of lung field: Secondary | ICD-10-CM | POA: Diagnosis not present

## 2022-08-13 DIAGNOSIS — R270 Ataxia, unspecified: Secondary | ICD-10-CM | POA: Diagnosis present

## 2022-08-13 DIAGNOSIS — C50911 Malignant neoplasm of unspecified site of right female breast: Secondary | ICD-10-CM | POA: Diagnosis not present

## 2022-08-13 DIAGNOSIS — Z0389 Encounter for observation for other suspected diseases and conditions ruled out: Secondary | ICD-10-CM | POA: Diagnosis not present

## 2022-08-13 DIAGNOSIS — Z803 Family history of malignant neoplasm of breast: Secondary | ICD-10-CM

## 2022-08-13 DIAGNOSIS — E785 Hyperlipidemia, unspecified: Secondary | ICD-10-CM | POA: Diagnosis present

## 2022-08-13 DIAGNOSIS — D630 Anemia in neoplastic disease: Secondary | ICD-10-CM | POA: Diagnosis not present

## 2022-08-13 DIAGNOSIS — R202 Paresthesia of skin: Secondary | ICD-10-CM | POA: Diagnosis not present

## 2022-08-13 DIAGNOSIS — E43 Unspecified severe protein-calorie malnutrition: Secondary | ICD-10-CM | POA: Diagnosis present

## 2022-08-13 DIAGNOSIS — R29701 NIHSS score 1: Secondary | ICD-10-CM | POA: Diagnosis present

## 2022-08-13 DIAGNOSIS — D509 Iron deficiency anemia, unspecified: Secondary | ICD-10-CM | POA: Diagnosis not present

## 2022-08-13 DIAGNOSIS — K449 Diaphragmatic hernia without obstruction or gangrene: Secondary | ICD-10-CM | POA: Diagnosis not present

## 2022-08-13 DIAGNOSIS — Z9181 History of falling: Secondary | ICD-10-CM | POA: Diagnosis not present

## 2022-08-13 DIAGNOSIS — K753 Granulomatous hepatitis, not elsewhere classified: Secondary | ICD-10-CM | POA: Diagnosis not present

## 2022-08-13 HISTORY — DX: Cerebral infarction, unspecified: I63.9

## 2022-08-13 LAB — CBC WITH DIFFERENTIAL/PLATELET
Abs Immature Granulocytes: 0.01 10*3/uL (ref 0.00–0.07)
Basophils Absolute: 0 10*3/uL (ref 0.0–0.1)
Basophils Relative: 1 %
Eosinophils Absolute: 0.1 10*3/uL (ref 0.0–0.5)
Eosinophils Relative: 3 %
HCT: 32.5 % — ABNORMAL LOW (ref 36.0–46.0)
Hemoglobin: 11 g/dL — ABNORMAL LOW (ref 12.0–15.0)
Immature Granulocytes: 0 %
Lymphocytes Relative: 26 %
Lymphs Abs: 1.2 10*3/uL (ref 0.7–4.0)
MCH: 33 pg (ref 26.0–34.0)
MCHC: 33.8 g/dL (ref 30.0–36.0)
MCV: 97.6 fL (ref 80.0–100.0)
Monocytes Absolute: 0.5 10*3/uL (ref 0.1–1.0)
Monocytes Relative: 10 %
Neutro Abs: 2.8 10*3/uL (ref 1.7–7.7)
Neutrophils Relative %: 60 %
Platelets: 125 10*3/uL — ABNORMAL LOW (ref 150–400)
RBC: 3.33 MIL/uL — ABNORMAL LOW (ref 3.87–5.11)
RDW: 11.5 % (ref 11.5–15.5)
WBC: 4.6 10*3/uL (ref 4.0–10.5)
nRBC: 0 % (ref 0.0–0.2)

## 2022-08-13 LAB — COMPREHENSIVE METABOLIC PANEL
ALT: 17 U/L (ref 0–44)
AST: 22 U/L (ref 15–41)
Albumin: 4.1 g/dL (ref 3.5–5.0)
Alkaline Phosphatase: 95 U/L (ref 38–126)
Anion gap: 8 (ref 5–15)
BUN: 18 mg/dL (ref 8–23)
CO2: 23 mmol/L (ref 22–32)
Calcium: 9.2 mg/dL (ref 8.9–10.3)
Chloride: 100 mmol/L (ref 98–111)
Creatinine, Ser: 0.87 mg/dL (ref 0.44–1.00)
GFR, Estimated: >60 mL/min (ref >60)
Glucose, Bld: 91 mg/dL (ref 70–99)
Potassium: 4.1 mmol/L (ref 3.5–5.1)
Sodium: 131 mmol/L — ABNORMAL LOW (ref 135–145)
Total Bilirubin: 0.6 mg/dL (ref 0.3–1.2)
Total Protein: 6.9 g/dL (ref 6.5–8.1)

## 2022-08-13 LAB — TROPONIN I (HIGH SENSITIVITY)
Troponin I (High Sensitivity): 89 ng/L — ABNORMAL HIGH (ref <18)
Troponin I (High Sensitivity): 89 ng/L — ABNORMAL HIGH (ref <18)

## 2022-08-13 NOTE — ED Provider Triage Note (Signed)
Emergency Medicine Provider Triage Evaluation Note  Elizabeth Friedman , a 81 y.o. female  was evaluated in triage.  Pt complains of dizziness.  Patient diagnosed with a stroke 6 days ago, started on aspirin and Plavix.  Stroke 6 days ago presented with slurred speech, weakness and severe dizziness.  Today she states she developed some mild dizziness but no slurred speech or weakness.  No head ache.  She denies any current numbness or tingling or weakness in her upper or lower extremities.  No mental status changes.  No chest pain or shortness of breath.  Review of Systems  Positive: Dizziness Negative: Chest pain, shortness of breath, weakness  Physical Exam  BP (!) 160/101 (BP Location: Right Arm)   Pulse 65   Temp 97.7 F (36.5 C) (Oral)   Resp 17   SpO2 96%  Gen:   Awake, no distress no slurred speech.  No facial asymmetry Resp:  Normal effort  MSK:   Moves extremities without difficulty  Other:  Presents in a wheelchair  Medical Decision Making  Medically screening exam initiated at 5:12 PM.  Appropriate orders placed.  Elizabeth Friedman was informed that the remainder of the evaluation will be completed by another provider, this initial triage assessment does not replace that evaluation, and the importance of remaining in the ED until their evaluation is complete.     Duanne Guess, PA-C 08/13/22 1715

## 2022-08-13 NOTE — ED Triage Notes (Signed)
Pt presents to ED with c/o of dizziness. Pt states recent stroke ands recently discharged. Pt states she was sent by PCP. Pt states some numbness to R arm. Pt able to move all extremities at this time.   Pt has no pronator drift, pt does not have any facial droop, pt able to hold legs up as requested. Pt is A&Ox4.   Pt is also a CA pt.

## 2022-08-13 NOTE — ED Provider Notes (Signed)
[facility] Provider Note    Event Date/Time   First MD Initiated Contact with Patient 08/13/22 2228     (approximate)  History   Chief Complaint: Dizziness  HPI  KHAMIYA VARIN is a 81 y.o. female with a past medical history of gastric reflux, hyperlipidemia, recently diagnosed with multiple small CVAs presents emergency department for weakness and slurred speech.  According to record review patient was admitted to the hospital 08/07/2022 ultimately diagnosed with multiple small strokes on MRI.  Remainder the patient's work-up was largely nonrevealing and the patient was discharged home.  Since going home they have noted that the patient's symptoms have seemed to worsen at times with worsening dizziness or off-balance sensation as well as worsening slurred speech.  They noted today the speech seemed particularly worse however it is now gone back to her baseline that she was at at discharge.  Denies any weakness or numbness of any arm or leg.  States currently she feels well.  Physical Exam   Triage Vital Signs: ED Triage Vitals  Enc Vitals Group     BP 08/13/22 1704 (!) 160/101     Pulse Rate 08/13/22 1704 65     Resp 08/13/22 1704 17     Temp 08/13/22 1704 97.7 F (36.5 C)     Temp Source 08/13/22 1704 Oral     SpO2 08/13/22 1704 96 %     Weight --      Height --      Head Circumference --      Peak Flow --      Pain Score 08/13/22 1705 0     Pain Loc --      Pain Edu? --      Excl. in [place]? --     Most recent vital signs: Vitals:   08/13/22 2020 08/13/22 2130  BP: (!) 150/95 (!) 182/91  Pulse: 66 65  Resp: 18 18  Temp: 98.2 F (36.8 C) 98.2 F (36.8 C)  SpO2: 98% 99%    General: Awake, no distress.  CV:  Good peripheral perfusion.  Regular rate and rhythm  Resp:  Normal effort.  Equal breath sounds bilaterally.  Abd:  No distention.  Soft, nontender.  No rebound or guarding.  ED Results / Procedures / Treatments   EKG  EKG viewed and  interpreted by myself shows a normal sinus rhythm at 66 bpm with a narrow QRS, normal axis, normal intervals, nonspecific but no concerning ST changes.  RADIOLOGY  I have reviewed and interpreted the CT head images.  No large bleed seen on my evaluation. Radiology is read the CT scan is negative for acute bleed or infarct.   MEDICATIONS ORDERED IN ED: Medications - No data to display   IMPRESSION / MDM / [place] / ED COURSE  I reviewed the triage vital signs and the nursing notes.  Patient's presentation is most consistent with acute presentation with potential threat to life or bodily function.  Patient presents to the emergency department for intermittently worsening symptoms of dizziness and slurred speech these are the same symptoms the patient was admitted for in 10/13 and the patient was diagnosed with multiple small CVAs.  Patient's work-up today shows a reassuring CT scan with no acute finding.  Chemistry shows no significant finding and CBC is largely normal.  Troponin is slightly elevated at 89 however this is downtrending from the 13th.  Patient denies any chest pain.  Given the patient's recurring  and intermittent symptoms discussed with the patient even though her work-up so far looks well I do believe it is worth repeating an MRI to ensure that she does not continue to shower emboli.  I discussed with the patient and son that if the MRI shows no change from her prior MRI believe she could be safely discharged home and follow-up with her doctor however if the MRI shows new stroke patient would likely require admission for further work-up and treatment.  Patient and family agreeable to this plan.  MRI pending, patient care signed out to oncoming provider.  FINAL CLINICAL IMPRESSION(S) / ED DIAGNOSES   Dizziness    Note:  This document was prepared using Dragon voice recognition software and may include unintentional dictation errors.   Harvest Dark,  MD 08/13/22 2310

## 2022-08-13 NOTE — ED Notes (Signed)
Patient transported to MRI 

## 2022-08-14 ENCOUNTER — Ambulatory Visit: Payer: Self-pay | Admitting: Cardiology

## 2022-08-14 DIAGNOSIS — I639 Cerebral infarction, unspecified: Secondary | ICD-10-CM | POA: Diagnosis not present

## 2022-08-14 DIAGNOSIS — R202 Paresthesia of skin: Secondary | ICD-10-CM | POA: Insufficient documentation

## 2022-08-14 DIAGNOSIS — R42 Dizziness and giddiness: Secondary | ICD-10-CM | POA: Diagnosis present

## 2022-08-14 DIAGNOSIS — G43909 Migraine, unspecified, not intractable, without status migrainosus: Secondary | ICD-10-CM

## 2022-08-14 DIAGNOSIS — K219 Gastro-esophageal reflux disease without esophagitis: Secondary | ICD-10-CM | POA: Diagnosis present

## 2022-08-14 DIAGNOSIS — E785 Hyperlipidemia, unspecified: Secondary | ICD-10-CM | POA: Diagnosis present

## 2022-08-14 DIAGNOSIS — Z79899 Other long term (current) drug therapy: Secondary | ICD-10-CM | POA: Diagnosis not present

## 2022-08-14 DIAGNOSIS — Z7902 Long term (current) use of antithrombotics/antiplatelets: Secondary | ICD-10-CM | POA: Diagnosis not present

## 2022-08-14 DIAGNOSIS — Z791 Long term (current) use of non-steroidal anti-inflammatories (NSAID): Secondary | ICD-10-CM | POA: Diagnosis not present

## 2022-08-14 DIAGNOSIS — E871 Hypo-osmolality and hyponatremia: Secondary | ICD-10-CM | POA: Diagnosis present

## 2022-08-14 DIAGNOSIS — I7300 Raynaud's syndrome without gangrene: Secondary | ICD-10-CM | POA: Diagnosis present

## 2022-08-14 DIAGNOSIS — G8194 Hemiplegia, unspecified affecting left nondominant side: Secondary | ICD-10-CM | POA: Diagnosis present

## 2022-08-14 DIAGNOSIS — Z803 Family history of malignant neoplasm of breast: Secondary | ICD-10-CM | POA: Diagnosis not present

## 2022-08-14 DIAGNOSIS — I63511 Cerebral infarction due to unspecified occlusion or stenosis of right middle cerebral artery: Secondary | ICD-10-CM | POA: Diagnosis not present

## 2022-08-14 DIAGNOSIS — D649 Anemia, unspecified: Secondary | ICD-10-CM | POA: Diagnosis present

## 2022-08-14 DIAGNOSIS — Z85828 Personal history of other malignant neoplasm of skin: Secondary | ICD-10-CM | POA: Diagnosis not present

## 2022-08-14 DIAGNOSIS — Z853 Personal history of malignant neoplasm of breast: Secondary | ICD-10-CM | POA: Diagnosis not present

## 2022-08-14 DIAGNOSIS — I10 Essential (primary) hypertension: Secondary | ICD-10-CM | POA: Diagnosis not present

## 2022-08-14 DIAGNOSIS — Z681 Body mass index (BMI) 19 or less, adult: Secondary | ICD-10-CM | POA: Diagnosis not present

## 2022-08-14 DIAGNOSIS — E538 Deficiency of other specified B group vitamins: Secondary | ICD-10-CM | POA: Diagnosis not present

## 2022-08-14 DIAGNOSIS — Z8249 Family history of ischemic heart disease and other diseases of the circulatory system: Secondary | ICD-10-CM | POA: Diagnosis not present

## 2022-08-14 DIAGNOSIS — I69319 Unspecified symptoms and signs involving cognitive functions following cerebral infarction: Secondary | ICD-10-CM | POA: Diagnosis not present

## 2022-08-14 DIAGNOSIS — R4781 Slurred speech: Secondary | ICD-10-CM | POA: Diagnosis present

## 2022-08-14 DIAGNOSIS — J479 Bronchiectasis, uncomplicated: Secondary | ICD-10-CM | POA: Diagnosis present

## 2022-08-14 DIAGNOSIS — Z8673 Personal history of transient ischemic attack (TIA), and cerebral infarction without residual deficits: Secondary | ICD-10-CM | POA: Diagnosis not present

## 2022-08-14 DIAGNOSIS — R29701 NIHSS score 1: Secondary | ICD-10-CM | POA: Diagnosis present

## 2022-08-14 DIAGNOSIS — Z66 Do not resuscitate: Secondary | ICD-10-CM | POA: Diagnosis present

## 2022-08-14 DIAGNOSIS — Z7982 Long term (current) use of aspirin: Secondary | ICD-10-CM | POA: Diagnosis not present

## 2022-08-14 DIAGNOSIS — E43 Unspecified severe protein-calorie malnutrition: Secondary | ICD-10-CM | POA: Diagnosis present

## 2022-08-14 DIAGNOSIS — I69354 Hemiplegia and hemiparesis following cerebral infarction affecting left non-dominant side: Secondary | ICD-10-CM | POA: Diagnosis not present

## 2022-08-14 DIAGNOSIS — Z8542 Personal history of malignant neoplasm of other parts of uterus: Secondary | ICD-10-CM | POA: Diagnosis not present

## 2022-08-14 DIAGNOSIS — I6340 Cerebral infarction due to embolism of unspecified cerebral artery: Secondary | ICD-10-CM | POA: Diagnosis present

## 2022-08-14 DIAGNOSIS — R569 Unspecified convulsions: Secondary | ICD-10-CM | POA: Diagnosis not present

## 2022-08-14 LAB — URINALYSIS, ROUTINE W REFLEX MICROSCOPIC
Bacteria, UA: NONE SEEN
Bilirubin Urine: NEGATIVE
Glucose, UA: NEGATIVE mg/dL
Hgb urine dipstick: NEGATIVE
Ketones, ur: NEGATIVE mg/dL
Leukocytes,Ua: NEGATIVE
Nitrite: NEGATIVE
Protein, ur: NEGATIVE mg/dL
Specific Gravity, Urine: 1.006 (ref 1.005–1.030)
Squamous Epithelial / HPF: NONE SEEN (ref 0–5)
pH: 7 (ref 5.0–8.0)

## 2022-08-14 LAB — LIPID PANEL
Cholesterol: 152 mg/dL (ref 0–200)
HDL: 48 mg/dL (ref >40)
LDL Cholesterol: 86 mg/dL (ref 0–99)
Total CHOL/HDL Ratio: 3.2 RATIO
Triglycerides: 91 mg/dL (ref <150)
VLDL: 18 mg/dL (ref 0–40)

## 2022-08-14 MED ORDER — ACETAMINOPHEN 650 MG RE SUPP: 650.0000 mg | RECTAL | Status: DC | PRN

## 2022-08-14 MED ORDER — GABAPENTIN 400 MG PO CAPS
400.0000 mg | ORAL_CAPSULE | Freq: Two times a day (BID) | ORAL | Status: DC
  Administered 2022-08-14 – 2022-08-20 (×13): 400 mg via ORAL
  Filled 2022-08-14 (×13): qty 1

## 2022-08-14 MED ORDER — ACETAMINOPHEN 160 MG/5ML PO SOLN: 650.0000 mg | ORAL | Status: DC | PRN

## 2022-08-14 MED ORDER — ATORVASTATIN CALCIUM 20 MG PO TABS
40.0000 mg | ORAL_TABLET | Freq: Every day | ORAL | Status: DC
  Administered 2022-08-14 – 2022-08-20 (×7): 40 mg via ORAL
  Filled 2022-08-14 (×7): qty 2

## 2022-08-14 MED ORDER — SODIUM CHLORIDE 0.9 % IV SOLN: INTRAVENOUS | Status: DC

## 2022-08-14 MED ORDER — ENOXAPARIN SODIUM 40 MG/0.4ML IJ SOSY
40.0000 mg | PREFILLED_SYRINGE | INTRAMUSCULAR | Status: DC
  Administered 2022-08-14 – 2022-08-17 (×4): 40 mg via SUBCUTANEOUS
  Filled 2022-08-14 (×5): qty 0.4

## 2022-08-14 MED ORDER — VITAMIN D3 25 MCG (1000 UNIT) PO TABS
5000.0000 [IU] | ORAL_TABLET | Freq: Every day | ORAL | Status: DC
  Administered 2022-08-14 – 2022-08-20 (×7): 5000 [IU] via ORAL
  Filled 2022-08-14 (×12): qty 5

## 2022-08-14 MED ORDER — VITAMIN E 180 MG (400 UNIT) PO CAPS
400.0000 [IU] | ORAL_CAPSULE | Freq: Every day | ORAL | Status: DC
  Administered 2022-08-14 – 2022-08-20 (×7): 400 [IU] via ORAL
  Filled 2022-08-14 (×7): qty 1

## 2022-08-14 MED ORDER — ACETAMINOPHEN 325 MG PO TABS
650.0000 mg | ORAL_TABLET | ORAL | Status: DC | PRN
  Administered 2022-08-14 – 2022-08-19 (×6): 650 mg via ORAL
  Filled 2022-08-14 (×7): qty 2

## 2022-08-14 MED ORDER — COENZYME Q10 200 MG PO TABS: 200.0000 mg | ORAL_TABLET | Freq: Every morning | ORAL | Status: DC

## 2022-08-14 MED ORDER — ASPIRIN 81 MG PO CHEW
81.0000 mg | CHEWABLE_TABLET | Freq: Every day | ORAL | Status: DC
  Administered 2022-08-14 – 2022-08-20 (×7): 81 mg via ORAL
  Filled 2022-08-14 (×7): qty 1

## 2022-08-14 MED ORDER — CYANOCOBALAMIN 500 MCG PO TABS
500.0000 ug | ORAL_TABLET | Freq: Every day | ORAL | Status: DC
  Administered 2022-08-14 – 2022-08-20 (×7): 500 ug via ORAL
  Filled 2022-08-14 (×7): qty 1

## 2022-08-14 MED ORDER — STROKE: EARLY STAGES OF RECOVERY BOOK: Freq: Once | Status: AC

## 2022-08-14 MED ORDER — TRIAMCINOLONE ACETONIDE 55 MCG/ACT NA AERO: 2.0000 | INHALATION_SPRAY | Freq: Every day | NASAL | Status: DC | PRN

## 2022-08-14 MED ORDER — PANTOPRAZOLE SODIUM 40 MG PO TBEC
40.0000 mg | DELAYED_RELEASE_TABLET | Freq: Every day | ORAL | Status: DC
  Administered 2022-08-14 – 2022-08-20 (×7): 40 mg via ORAL
  Filled 2022-08-14 (×7): qty 1

## 2022-08-14 MED ORDER — HYDRALAZINE HCL 20 MG/ML IJ SOLN: 10.0000 mg | INTRAMUSCULAR | Status: DC | PRN

## 2022-08-14 MED ORDER — LABETALOL HCL 5 MG/ML IV SOLN: 5.0000 mg | INTRAVENOUS | Status: DC | PRN

## 2022-08-14 MED ORDER — TRAZODONE HCL 50 MG PO TABS: 25.0000 mg | ORAL_TABLET | Freq: Every evening | ORAL | Status: DC | PRN

## 2022-08-14 MED ORDER — CYCLOSPORINE 0.05 % OP EMUL
1.0000 [drp] | Freq: Two times a day (BID) | OPHTHALMIC | Status: DC
  Administered 2022-08-14 – 2022-08-20 (×13): 1 [drp] via OPHTHALMIC
  Filled 2022-08-14 (×13): qty 30

## 2022-08-14 MED ORDER — ONDANSETRON HCL 4 MG/2ML IJ SOLN: 4.0000 mg | INTRAMUSCULAR | Status: DC | PRN

## 2022-08-14 MED ORDER — TOPIRAMATE 25 MG PO TABS
50.0000 mg | ORAL_TABLET | Freq: Every evening | ORAL | Status: DC
  Administered 2022-08-14 – 2022-08-19 (×6): 50 mg via ORAL
  Filled 2022-08-14 (×8): qty 2

## 2022-08-14 MED ORDER — GABAPENTIN 300 MG PO CAPS
400.0000 mg | ORAL_CAPSULE | Freq: Once | ORAL | Status: AC
  Administered 2022-08-14: 400 mg via ORAL
  Filled 2022-08-14: qty 1

## 2022-08-14 MED ORDER — POLYVINYL ALCOHOL 1.4 % OP SOLN
1.0000 [drp] | Freq: Every day | OPHTHALMIC | Status: DC
  Administered 2022-08-14 – 2022-08-19 (×5): 1 [drp] via OPHTHALMIC
  Filled 2022-08-14 (×3): qty 15

## 2022-08-14 MED ORDER — CLOPIDOGREL BISULFATE 75 MG PO TABS
75.0000 mg | ORAL_TABLET | Freq: Every day | ORAL | Status: DC
  Administered 2022-08-14 – 2022-08-17 (×4): 75 mg via ORAL
  Filled 2022-08-14 (×4): qty 1

## 2022-08-14 MED ORDER — FERROUS FUMARATE 324 (106 FE) MG PO TABS
1.0000 | ORAL_TABLET | Freq: Every day | ORAL | Status: DC
  Administered 2022-08-14 – 2022-08-20 (×6): 106 mg via ORAL
  Filled 2022-08-14 (×7): qty 1

## 2022-08-14 MED ORDER — SENNOSIDES-DOCUSATE SODIUM 8.6-50 MG PO TABS: 1.0000 | ORAL_TABLET | Freq: Every evening | ORAL | Status: DC | PRN

## 2022-08-14 MED ORDER — VITAMIN C 500 MG PO TABS
250.0000 mg | ORAL_TABLET | Freq: Every day | ORAL | Status: DC
  Administered 2022-08-14 – 2022-08-20 (×7): 250 mg via ORAL
  Filled 2022-08-14 (×7): qty 1

## 2022-08-14 MED ORDER — CELECOXIB 200 MG PO CAPS
200.0000 mg | ORAL_CAPSULE | Freq: Once | ORAL | Status: AC
  Administered 2022-08-14: 200 mg via ORAL
  Filled 2022-08-14: qty 1

## 2022-08-14 NOTE — Assessment & Plan Note (Addendum)
This is a recurrent subacute to acute CVA, concerning for central involvement source. -This is manifested by dizziness and right-sided paresthesias - The patient will be admitted to an observation cardiac telemetry bed. - We will continue on aspirin and Plavix. - We will continue statin therapy. - We will obtain a TEE to look for a cardiac embolic source. - Dr. Clayborn Bigness about the patient - Neurology consult will be obtained. - I notified Dr. Rory Percy about the patient.

## 2022-08-14 NOTE — ED Notes (Signed)
[facility] called for neuro consult

## 2022-08-14 NOTE — H&P (Signed)
[place]   PATIENT NAME: Elizabeth Friedman    MR#:  [id number]  DATE OF BIRTH:  08/29/1941  DATE OF ADMISSION:  08/13/2022  PRIMARY CARE PHYSICIAN: Adin Hector, MD   Patient is coming from: Home  REQUESTING/REFERRING PHYSICIAN: Vladimir Crofts, MD  CHIEF COMPLAINT:   Chief Complaint  Patient presents with   Dizziness    HISTORY OF PRESENT ILLNESS:  Elizabeth Friedman is a 81 y.o. female with medical history significant for recurrent CVA, dyslipidemia, GERD, Osteoarthritis, vitamin B12 deficiency and migraine, presented to the emergency room with a [place] of dizziness and right-sided upper and lower extremity paresthesias with tingling and numbness when she woke up yesterday morning.  She was having mild speech difficulty that has resolved.  She denies any tinnitus or vertigo.  No urinary or stool incontinence.  She admits to headache without diplopia.  She denies any other focal muscle weakness.  She was recently admitted here for CVA with similar symptoms.  She was placed on aspirin and Plavix which she has been taking.  CT of the head and neck showed severe stenosis/occlusion concerns in L P1 with reconstitution and in proximal L P3 and moderate stenosis in the distal basilar artery with mild carotid bifurcation atherosclerosis without significant stenosis.  She was discharged on Holter monitor.  Her echo was unremarkable and showed an EF of 60 to 65% with mild mitral valve regurgitation.  No chest pain or palpitations.  No cough or wheezing hemoptysis.  No dysuria, oliguria or hematuria or flank pain.  ED Course: When she had to the ER, BP was 160/101 with otherwise normal vital signs.  Labs revealed mild hyponatremia with otherwise unremarkable CMP.  High sensitive troponin I was 89 and later the same.  On 10/14 it was 849.  CBC showed anemia with hemoglobin 11 hematocrit 32.5 and platelets were 125. Her H&H are slightly worse than previous levels. EKG as reviewed by me : EKG  showed normal sinus rhythm with a rate of 66 with Q waves anteroseptally and T wave inversion laterally and inferiorly Imaging: Portable chest ray showed unchanged reticulonodular opacities within the periphery of the right lower lobe with no new airspace disease.  Noncontrast head CT scan revealed old lacunar infarcts in the left basal ganglia and left cerebellum without acute intracranial abnormality.  Brain MRI without contrast however revealed the following: 1. Scattered acute to early subacute infarcts involving the posterior right frontal parietal region, left cerebellum, and left temporoccipital region. Minimal petechial blood products at the left cerebellum without hemorrhagic transformation or significant regional mass effect. Given the various vascular distributions involved, a central thromboembolic etiology is likely. 2. 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[place])    Ductal carcinoma in situ (DCIS) of right breast    Fibrocystic breast disease    GERD (gastroesophageal reflux disease)    Hearing aid worn    bilateral   History of hiatal hernia    Hyperlipidemia    Lung nodule    Menopause    Migraine    Migraine    optical migraines   Motion sickness    Plantar fasciitis  Prolapse of female pelvic organs    hodge pessary   Raynaud's disease    Sciatic leg pain    Sciatica    Skin cancer of face    Stroke [facility])    Urinary retention with incomplete bladder emptying    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   Vitreous detachment of both eyes     PAST SURGICAL HISTORY:   Past Surgical History:  Procedure Laterality Date   APPENDECTOMY  1951   BREAST LUMPECTOMY WITH RADIOACTIVE SEED LOCALIZATION Right  05/29/2020   Procedure: RIGHT BREAST LUMPECTOMY WITH RADIOACTIVE SEED LOCALIZATION;  Surgeon: Rolm Bookbinder, MD;  Location: [place];  Service: General;  Laterality: Right;   CARDIAC CATHETERIZATION  2004   negative   CATARACT EXTRACTION W/PHACO Right 02/26/2021   Procedure: CATARACT EXTRACTION PHACO AND INTRAOCULAR LENS PLACEMENT (IOC) RIGHT 5.86 01:19.6 7.4%;  Surgeon: Leandrew Koyanagi, MD;  Location: [place];  Service: Ophthalmology;  Laterality: Right;   CATARACT EXTRACTION W/PHACO Left 09/03/2021   Procedure: CATARACT EXTRACTION PHACO AND INTRAOCULAR LENS PLACEMENT ([place]) LEFT;  Surgeon: Leandrew Koyanagi, MD;  Location: [place];  Service: Ophthalmology;  Laterality: Left;  9.61 01:15.9   COLONOSCOPY     2000, 2006, 2012   COLONOSCOPY WITH PROPOFOL N/A 03/27/2016   Procedure: COLONOSCOPY WITH PROPOFOL;  Surgeon: Manya Silvas, MD;  Location: [facility] ENDOSCOPY;  Service: Endoscopy;  Laterality: N/A;   COLONOSCOPY WITH PROPOFOL N/A 08/14/2020   Procedure: COLONOSCOPY WITH PROPOFOL;  Surgeon: Toledo, Benay Pike, MD;  Location: [facility] ENDOSCOPY;  Service: Gastroenterology;  Laterality: N/A;   CYSTOSCOPY N/A 12/24/2021   Procedure: CYSTOSCOPY;  Surgeon: Jaquita Folds, MD;  Location: [facility]S;  Service: Gynecology;  Laterality: N/A;   DILATATION & CURETTAGE/HYSTEROSCOPY WITH MYOSURE N/A 12/01/2021   Procedure: DILATATION & CURETTAGE/HYSTEROSCOPY WITH MYOSURE;  Surgeon: Rubie Maid, MD;  Location: [facility];  Service: Gynecology;  Laterality: N/A;   ESOPHAGOGASTRODUODENOSCOPY  08/1999   ESOPHAGOGASTRODUODENOSCOPY (EGD) WITH PROPOFOL N/A 08/14/2020   Procedure: ESOPHAGOGASTRODUODENOSCOPY (EGD) WITH PROPOFOL;  Surgeon: Toledo, Benay Pike, MD;  Location: [facility] ENDOSCOPY;  Service: Gastroenterology;  Laterality: N/A;   ESOPHAGOGASTRODUODENOSCOPY (EGD) WITH PROPOFOL N/A 03/21/2021   Procedure: ESOPHAGOGASTRODUODENOSCOPY (EGD) WITH PROPOFOL;   Surgeon: Toledo, Benay Pike, MD;  Location: [facility] ENDOSCOPY;  Service: Gastroenterology;  Laterality: N/A;   PORTA CATH INSERTION N/A 01/15/2022   Procedure: PORTA CATH INSERTION;  Surgeon: Algernon Huxley, MD;  Location: [place] [facility];  Service: Cardiovascular;  Laterality: N/A;   PORTA CATH REMOVAL N/A 06/15/2022   Procedure: PORTA CATH REMOVAL;  Surgeon: Algernon Huxley, MD;  Location: [place] [facility];  Service: Cardiovascular;  Laterality: N/A;   SKIN CANCER EXCISION     face and neck   TOTAL LAPAROSCOPIC HYSTERECTOMY WITH BILATERAL SALPINGO OOPHORECTOMY Bilateral 12/24/2021   Procedure: TOTAL LAPAROSCOPIC HYSTERECTOMY WITH BILATERAL SALPINGO OOPHORECTOMY, PELVIC NODE BIOPSY,  PESSARY REMOVAL (PESSARY REMOVAL BY DR. Wannetta Sender;  Surgeon: Mellody Drown, MD;  Location: [facility];  Service: Gynecology;  Laterality: Bilateral;    SOCIAL HISTORY:   Social History   Tobacco Use   Smoking status: Never   Smokeless tobacco: Never  Substance Use Topics   Alcohol use: No    FAMILY HISTORY:   Family History  Problem Relation Age of Onset   Breast cancer Mother    Heart disease Father    Colon cancer Brother    Diabetes Neg Hx    Ovarian cancer Neg Hx     DRUG ALLERGIES:  No Known Allergies  REVIEW OF SYSTEMS:   ROS As per history of present illness. All pertinent systems were reviewed above. Constitutional, HEENT, cardiovascular, respiratory, GI, GU, musculoskeletal, neuro, psychiatric, endocrine, integumentary and hematologic systems were reviewed and are otherwise negative/unremarkable except for positive findings mentioned above in the HPI.   MEDICATIONS AT HOME:   Prior to Admission medications   Medication Sig Start Date End Date Taking? Authorizing Provider  acetaminophen (TYLENOL) 500 MG tablet Take 1,000 mg by mouth every 6 (six) hours as needed for moderate pain or mild pain.    [provider]  alum & mag hydroxide-simeth (MAALOX/MYLANTA) 200-200-20 MG/5ML  suspension Take 30 mLs by mouth every 6 (six) hours as needed for indigestion or heartburn. Patient not taking: Reported on 08/05/2022    [provider]  ascorbic acid (VITAMIN C) 250 MG tablet Take 1 tablet (250 mg total) by mouth daily. 08/11/22   Emeterio Reeve, DO  aspirin 81 MG chewable tablet Chew 1 tablet (81 mg total) by mouth daily. 08/11/22   Emeterio Reeve, DO  atorvastatin (LIPITOR) 40 MG tablet Take 1 tablet (40 mg total) by mouth daily. 08/11/22   Emeterio Reeve, DO  Calcium-Vitamin D (CALTRATE 600 PLUS-VIT D PO) Take 1 tablet by mouth in the morning.    [provider]  Carboxymethylcellulose Sod PF 1 % GEL Place 1 drop into both eyes at bedtime.    [provider]  celecoxib (CELEBREX) 200 MG capsule Take 200 mg by mouth 2 (two) times daily.    [provider]  Cholecalciferol (VITAMIN D) 125 MCG (5000 UT) CAPS Take 5,000 Units by mouth in the morning.    [provider]  clopidogrel (PLAVIX) 75 MG tablet Take 1 tablet (75 mg total) by mouth daily. PLAVIX + ASPIRIN FOR 90 DAYS, THEN ASPIRIN ALONE INDEFINITELY 08/11/22 11/09/22  Emeterio Reeve, DO  Coenzyme Q10 200 MG TABS Take 200 mg by mouth in the morning.    [provider]  Cyanocobalamin (VITAMIN B-12 PO) Take 1 tablet by mouth in the morning.    [provider]  Ferrous Fumarate (HEMOCYTE - 106 MG FE) 324 (106 Fe) MG TABS tablet Take 1 tablet (106 mg of iron total) by mouth daily. 08/11/22   Emeterio Reeve, DO  gabapentin (NEURONTIN) 400 MG capsule Take 1 capsule (400 mg total) by mouth 2 (two) times daily. 08/10/22   Emeterio Reeve, DO  magnesium hydroxide (MILK OF MAGNESIA) 400 MG/5ML suspension Take by mouth daily as needed for mild constipation.    [provider]  pantoprazole (PROTONIX) 40 MG tablet Take 40 mg by mouth in the morning.    [provider]  RESTASIS 0.05 % ophthalmic emulsion Place 1 drop into both eyes 2  (two) times daily. 10/24/15   [provider]  topiramate (TOPAMAX) 50 MG tablet Take 50 mg by mouth every evening. 12/22/19   [provider]  Triamcinolone Acetonide (NASACORT ALLERGY 24HR NA) Place 1 spray into the nose daily as needed (sinus congestion).    [provider]  vitamin E 180 MG (400 UNITS) capsule Take 400 Units by mouth in the morning.    [provider]      VITAL SIGNS:  Blood pressure (!) 149/76, pulse 64, temperature 98.1 F (36.7 C), temperature source Oral, resp. rate 12, SpO2 96 %.  PHYSICAL EXAMINATION:  Physical Exam  GENERAL:  81 y.o.-year-old Caucasian patient lying in the bed with no acute distress.  EYES: Pupils  equal, round, reactive to light and accommodation. No scleral icterus. Extraocular muscles intact.  HEENT: Head atraumatic, normocephalic. Oropharynx and nasopharynx clear.  NECK:  Supple, no jugular venous distention. No thyroid enlargement, no tenderness.  LUNGS: Normal breath sounds bilaterally, no wheezing, rales,rhonchi or crepitation. No use of accessory muscles of respiration.  CARDIOVASCULAR: Regular rate and rhythm, S1, S2 normal. No murmurs, rubs, or gallops.  ABDOMEN: Soft, nondistended, nontender. Bowel sounds present. No organomegaly or mass.  EXTREMITIES: No pedal edema, cyanosis, or clubbing.  NEUROLOGIC: Cranial nerves II through XII are intact. Muscle strength 5/5 in all extremities. Sensation intact. Gait not checked.  PSYCHIATRIC: The patient is alert and oriented x 3.  Normal affect and good eye contact. SKIN: No obvious rash, lesion, or ulcer.   LABORATORY PANEL:   CBC Recent Labs  Lab 08/13/22 1715  WBC 4.6  HGB 11.0*  HCT 32.5*  PLT 125*   ------------------------------------------------------------------------------------------------------------------  Chemistries  Recent Labs  Lab 08/13/22 1715  NA 131*  K 4.1  CL 100  CO2 23  GLUCOSE 91  BUN 18  CREATININE 0.87   CALCIUM 9.2  AST 22  ALT 17  ALKPHOS 95  BILITOT 0.6   ------------------------------------------------------------------------------------------------------------------  Cardiac Enzymes No results for input(s): "TROPONINI" in the last 168 hours. ------------------------------------------------------------------------------------------------------------------  RADIOLOGY:  MR BRAIN WO CONTRAST  Result Date: 08/14/2022 CLINICAL DATA:  Follow-up examination for stroke. EXAM: MRI HEAD WITHOUT CONTRAST TECHNIQUE: Multiplanar, multiecho pulse sequences of the brain and surrounding structures were obtained without intravenous contrast. COMPARISON:  Prior CT from earlier the same day. FINDINGS: Brain: Cerebral volume within normal limits. Scattered patchy T2/FLAIR hyperintensity involving the periventricular deep white matter of both cerebral hemispheres as well as the pons, consistent with chronic microvascular ischemic disease, mild for age. Few small remote lacunar infarcts noted at the left caudate head and ventral right thalamus. Patchy diffusion signal abnormality involving the cortical and subcortical aspect of the right frontoparietal region, with involvement of the pre and postcentral gyri, consistent with an acute to early subacute right MCA distribution infarct. No associated hemorrhage. There is an additional patchy small volume acute to early subacute left cerebellar infarct. Minimal petechial blood products at this level without hemorrhagic transformation or significant mass effect. Few additional punctate foci of acute to early subacute ischemia noted within the left temporal occipital region (series 5, images 19, 16). Gray-white matter differentiation otherwise maintained. No other acute or chronic intracranial blood products. No mass lesion or midline shift. No hydrocephalus or extra-axial fluid collection. Partially empty sella noted. Vascular: Major intracranial vascular flow voids are  maintained. Skull and upper cervical spine: Craniocervical junction normal. Bone marrow signal intensity within normal limits. No scalp soft tissue abnormality. Sinuses/Orbits: Prior bilateral ocular lens replacement. Paranasal sinuses mastoid air cells are clear. Other: None. IMPRESSION: 1. Scattered acute to early subacute infarcts involving the posterior right frontal parietal region, left cerebellum, and left temporoccipital region. Minimal petechial blood products at the left cerebellum without hemorrhagic transformation or significant regional mass effect. Given the various vascular distributions involved, a central thromboembolic etiology is likely. 2. Underlying chronic microvascular ischemic disease with a few small remote lacunar infarcts as above. Electronically Signed   By: Jeannine Boga M.D.   On: 08/14/2022 00:21   CT HEAD WO CONTRAST (5MM)  Result Date: 08/13/2022 CLINICAL DATA:  Dizziness.  Recent stroke. EXAM: CT HEAD WITHOUT CONTRAST TECHNIQUE: Contiguous axial images were obtained from the base of the skull through the vertex without intravenous contrast. RADIATION  DOSE REDUCTION: This exam was performed according to the departmental dose-optimization program which includes automated exposure control, adjustment of the mA and/or kV according to patient size and/or use of iterative reconstruction technique. COMPARISON:  08/08/2022 FINDINGS: Brain: No evidence of intracranial hemorrhage, acute infarction, hydrocephalus, extra-axial collection, or mass lesion/mass effect. Old lacunar infarcts are again seen involving the left basal ganglia and left cerebellum. Vascular:  No hyperdense vessel or other acute findings. Skull: No evidence of fracture or other significant bone abnormality. Sinuses/Orbits:  No acute findings. Other: None. IMPRESSION: No acute intracranial abnormality. Old lacunar infarcts in left basal ganglia and left cerebellum. Electronically Signed   By: Marlaine Hind M.D.    On: 08/13/2022 17:51      IMPRESSION AND PLAN:  Assessment and Plan: * Acute CVA (cerebrovascular accident) [facility]) This is a recurrent subacute to acute CVA, concerning for central involvement source. -This is manifested by dizziness and right-sided paresthesias - The patient will be admitted to an observation cardiac telemetry bed. - We will continue on aspirin and Plavix. - We will continue statin therapy. - We will obtain a TEE to look for a cardiac embolic source. - Dr. Clayborn Bigness about the patient - Neurology consult will be obtained. - I notified Dr. Rory Percy about the patient.  Dyslipidemia We will continue statin therapy and check fasting lipids.  GERD without esophagitis - We will continue PPI therapy.  Migraine - We will continue her Topamax.   DVT prophylaxis: Lovenox.  Advanced Care Planning:  Code Status: DNR/DNI. Family Communication:  The plan of care was discussed in details with the patient (and family). I answered all questions. The patient agreed to proceed with the above mentioned plan. Further management will depend upon hospital course. Disposition Plan: Back to previous home environment Consults called: none.  All the records are reviewed and case discussed with ED provider.  Status is: Observation   I certify that at the time of admission, it is my clinical judgment that the patient will require  hospital care extending LESS than 2 midnights.                            Dispo: The patient is from: Home              Anticipated d/c is to: Home              Patient currently is not medically stable to d/c.              Difficult to place patient: No  Christel Mormon M.D on 08/14/2022 at 4:51 AM  [facility]   From 7 PM-7 AM, contact night-coverage [email or web address]  CC: Primary care physician; Adin Hector, MD

## 2022-08-14 NOTE — Assessment & Plan Note (Addendum)
- 

## 2022-08-14 NOTE — Evaluation (Signed)
Occupational Therapy Evaluation Patient Details Name: Elizabeth Friedman MRN: [id number] DOB: 1941/10/04 Today's Date: 08/14/2022   History of Present Illness pt is an 81 year old female admitted  with recurrent subacute to acute CVA, concerning for central involvement source after presenting to ED with dizziness and right-sided paresthesias; PMH significant for past history of breast and uterine cancer, recurrent CVA, dyslipidemia, GERD, Osteoarthritis, vitamin B12 deficiency and migraine   Clinical Impression   Chart reviewed, pt greeted in bed agreeeable to OT evaluation. Pt is alert, oriented x4. Mildly increased processing time required throughout. Pt reports since previous admission family has been assisting with ADL/IADL as needed, staying at her house. Pt presents with deficits in strength, endurance, activity tolerance all affecting safe and optimal ADL completion. Supervision-CGA required for functional amb in room with RW, toileting completed with supervision. Recommend HHOT following discharge to address functional deficits. OT will continue to follow acutely.      Recommendations for follow up therapy are one component of a multi-disciplinary discharge planning process, led by the attending physician.  Recommendations may be updated based on patient status, additional functional criteria and insurance authorization.   Follow Up Recommendations  Home health OT    Assistance Recommended at Discharge Intermittent Supervision/Assistance  Patient can return home with the following A little help with walking and/or transfers;A little help with bathing/dressing/bathroom;Assistance with cooking/housework;Assist for transportation;Help with stairs or ramp for entrance    Functional Status Assessment  Patient has had a recent decline in their functional status and demonstrates the ability to make significant improvements in function in a reasonable and predictable amount of time.  Equipment  Recommendations  BSC/3in1    Recommendations for Other Services       Precautions / Restrictions Precautions Precautions: Fall Restrictions Weight Bearing Restrictions: No      Mobility Bed Mobility Overal bed mobility: Needs Assistance Bed Mobility: Supine to Sit, Sit to Supine     Supine to sit: Supervision Sit to supine: Supervision        Transfers Overall transfer level: Needs assistance Equipment used: Rolling walker (2 wheels) Transfers: Sit to/from Stand Sit to Stand: Supervision                  Balance Overall balance assessment: Needs assistance Sitting-balance support: Feet supported Sitting balance-Leahy Scale: Good     Standing balance support: Bilateral upper extremity supported Standing balance-Leahy Scale: Good                             ADL either performed or assessed with clinical judgement   ADL Overall ADL's : Needs assistance/impaired     Grooming: Set up;Supervision/safety;Sitting               Lower Body Dressing: Moderate assistance   Toilet Transfer: Min guard;Rolling walker (2 wheels)   Toileting- Clothing Manipulation and Hygiene: Supervision/safety;Sit to/from stand       Functional mobility during ADLs: Min guard;Rolling walker (2 wheels) (approx 15' 2 attempts in room)       Vision Baseline Vision/History: 1 Wears glasses Patient Visual Report: No change from baseline       Perception     Praxis      Pertinent Vitals/Pain Pain Assessment Pain Assessment: No/denies pain     Hand Dominance     Extremity/Trunk Assessment Upper Extremity Assessment Upper Extremity Assessment: Generalized weakness   Lower Extremity Assessment Lower Extremity Assessment: Generalized weakness  Cervical / Trunk Assessment Cervical / Trunk Assessment: Normal   Communication Communication Communication: No difficulties   Cognition Arousal/Alertness: Awake/alert Behavior During Therapy: WFL for  tasks assessed/performed Overall Cognitive Status: Within Functional Limits for tasks assessed                                       General Comments       Exercises     Shoulder Instructions      Home Living Family/patient expects to be discharged to:: Private residence Living Arrangements: Alone Available Help at Discharge: Family;Available 24 hours/day (family has been staying 24/7 since recent admission) Type of Home: House       Home Layout: One level     Bathroom Shower/Tub: Tub/shower unit;Walk-in Psychologist, prison and probation services: Standard     Home Equipment: Civil engineer, contracting - built in;Hand held Chief Technology Officer (4 wheels)          Prior Functioning/Environment Prior Level of Function : Independent/Modified Independent;Driving             Mobility Comments: amb with rollator since previous admission ADLs Comments: prior to previous admission, indep in ADL/IADL; light assist for ADLs, assist for IADLs after previous admission        OT Problem List: Decreased strength;Decreased knowledge of use of DME or AE;Decreased activity tolerance;Impaired balance (sitting and/or standing);Impaired vision/perception;Impaired sensation      OT Treatment/Interventions: Self-care/ADL training;Patient/family education;Therapeutic exercise;Balance training;Energy conservation;Therapeutic activities;DME and/or AE instruction    OT Goals(Current goals can be found in the care plan section) Acute Rehab OT Goals Patient Stated Goal: get stronger OT Goal Formulation: With patient/family Time For Goal Achievement: 08/28/22 Potential to Achieve Goals: Good ADL Goals Pt Will Perform Grooming: with modified independence Pt Will Perform Lower Body Dressing: with modified independence Pt Will Transfer to Toilet: with modified independence Pt Will Perform Toileting - Clothing Manipulation and hygiene: with modified independence  OT Frequency: Min 2X/week     Co-evaluation              AM-PAC OT "6 Clicks" Daily Activity     Outcome Measure Help from another person eating meals?: None Help from another person taking care of personal grooming?: A Little Help from another person toileting, which includes using toliet, bedpan, or urinal?: A Little Help from another person bathing (including washing, rinsing, drying)?: A Little Help from another person to put on and taking off regular upper body clothing?: A Little Help from another person to put on and taking off regular lower body clothing?: A Little 6 Click Score: 19   End of Session Equipment Utilized During Treatment: Rolling walker (2 wheels) Nurse Communication: Mobility status  Activity Tolerance: Patient tolerated treatment well Patient left: in bed;with call bell/phone within reach;with family/visitor present  OT Visit Diagnosis: Unsteadiness on feet (R26.81);Muscle weakness (generalized) (M62.81);Dizziness and giddiness (R42)                Time: 8182-9937 OT Time Calculation (min): 27 min Charges:  OT General Charges $OT Visit: 1 Visit OT Evaluation $OT Eval Low Complexity: 1 Low Shanon Payor, OTD OTR/L  08/14/22, 10:44 AM

## 2022-08-14 NOTE — Progress Notes (Signed)
PROGRESS NOTE    Elizabeth Friedman  DQQ:[id number] DOB: [birth year] DOA: 08/13/2022 PCP: Adin Hector, MD   Brief Narrative: 81 year old with past medical history significant for recurrent CVA, dyslipidemia, GERD, osteoarthritis, vitamin B12 deficiency, breast and uterine cancer who was recently admitted for stroke on 10/13 who presents now with dizziness, right thigh paresthesia and tingling, she wake up the morning of admission with the symptoms.  She has been taking aspirin and Plavix.  MRI confirmed early subacute infarct involving the posterior right frontal parietal region, left cerebellum and left temporal occipital region.  Minimal petechial blood products at the level of the cerebellum without hemorrhagic transformation or significant regional mass effect.    Assessment & Plan:   Principal Problem:   Acute CVA (cerebrovascular accident) ([place]) Active Problems:   Dyslipidemia   GERD without esophagitis   Migraine  1-Acute CVA: -Patient presented with dizziness and right-sided paresthesia. -MRI: Scattered acute to early subacute infarct involving the posterior right frontoparietal region, left cerebellum and left temporal occipital region. -Currently on aspirin and Plavix.  Follow neurology recommendation. -Cardiology consulted for TEE>  Oncology consulted in regards input on hypercoagulability.  If she needs anticoagulation from a hypercoagulable with standpoint, then plan is not to proceed with TEE.  Device will need to be interrogated.  Nurse to contact representative. Remissive hypertension systolic blood pressure goal 180-- tomorrow goal 160/ PRN dressing and labetalol order  Dyslipidemia: Continue with Lipitor  GERD: Continue with PPI  Migraine: On Topamax   History of  urine and breast cancer: Oncology is consulted to provide input in regards hypercoagulability in the setting of recurrent stroke    Estimated body mass index is 18.51 kg/m as calculated from the  following:   Height as of 08/07/22: '5\' 2"'$  (1.575 m).   Weight as of 08/07/22: 45.9 kg.   DVT prophylaxis: Lovenox Code Status: DNR Family Communication: Son  at bedside Disposition Plan:  Status is: Observation The patient will require care spanning > 2 midnights and should be moved to inpatient because: Recurrent stroke    Consultants:  Neurology Oncology  Procedures:    Antimicrobials:    Subjective: She reports persistent of numbness and tingling on the right side, denies worsening symptoms.  Objective: Vitals:   08/14/22 0400 08/14/22 0500 08/14/22 0600 08/14/22 0700  BP: 122/73 (!) 144/73 126/66 132/75  Pulse: 60 80 64 75  Resp: '10 15 11 10  '$ Temp:      TempSrc:      SpO2: 95% 90% 95% 96%   No intake or output data in the 24 hours ending 08/14/22 0757 There were no vitals filed for this visit.  Examination:  General exam: Appears calm and comfortable  Respiratory system: Clear to auscultation. Respiratory effort normal. Cardiovascular system: S1 & S2 heard, RRR. No JVD, murmurs, rubs, gallops or clicks. No pedal edema. Gastrointestinal system: Abdomen is nondistended, soft and nontender. No organomegaly or masses felt. Normal bowel sounds heard. Central nervous system: Alert and oriented. No focal neurological deficits. Extremities: Symmetric 5 x 5 power.   Data Reviewed: I have personally reviewed following labs and imaging studies  CBC: Recent Labs  Lab 08/07/22 1458 08/13/22 1715  WBC 5.5 4.6  NEUTROABS 4.3 2.8  HGB 12.0 11.0*  HCT 35.6* 32.5*  MCV 98.6 97.6  PLT 121* 194*   Basic Metabolic Panel: Recent Labs  Lab 08/07/22 1458 08/13/22 1715  NA 135 131*  K 4.0 4.1  CL 103 100  CO2 24  23  GLUCOSE 117* 91  BUN 15 18  CREATININE 0.79 0.87  CALCIUM 9.5 9.2   GFR: Estimated Creatinine Clearance: 36.1 mL/min (by C-G formula based on SCr of 0.87 mg/dL). Liver Function Tests: Recent Labs  Lab 08/07/22 1458 08/13/22 1715  AST 25 22   ALT 18 17  ALKPHOS 92 95  BILITOT 0.6 0.6  PROT 7.2 6.9  ALBUMIN 4.3 4.1   No results for input(s): "LIPASE", "AMYLASE" in the last 168 hours. No results for input(s): "AMMONIA" in the last 168 hours. Coagulation Profile: Recent Labs  Lab 08/07/22 1458  INR 1.2   Cardiac Enzymes: No results for input(s): "CKTOTAL", "CKMB", "CKMBINDEX", "TROPONINI" in the last 168 hours. BNP (last 3 results) No results for input(s): "PROBNP" in the last 8760 hours. HbA1C: No results for input(s): "HGBA1C" in the last 72 hours. CBG: No results for input(s): "GLUCAP" in the last 168 hours. Lipid Profile: Recent Labs    08/14/22 0500  CHOL 152  HDL 48  LDLCALC 86  TRIG 91  CHOLHDL 3.2   Thyroid Function Tests: No results for input(s): "TSH", "T4TOTAL", "FREET4", "T3FREE", "THYROIDAB" in the last 72 hours. Anemia Panel: No results for input(s): "VITAMINB12", "FOLATE", "FERRITIN", "TIBC", "IRON", "RETICCTPCT" in the last 72 hours. Sepsis Labs: No results for input(s): "PROCALCITON", "LATICACIDVEN" in the last 168 hours.  Recent Results (from the past 240 hour(s))  Resp Panel by RT-PCR (Flu A&B, Covid) Anterior Nasal Swab     Status: None   Collection Time: 08/07/22  2:58 PM   Specimen: Anterior Nasal Swab  Result Value Ref Range Status   SARS Coronavirus 2 by RT PCR NEGATIVE NEGATIVE Final    Comment: (NOTE) SARS-CoV-2 target nucleic acids are NOT DETECTED.  The SARS-CoV-2 RNA is generally detectable in upper respiratory specimens during the acute phase of infection. The lowest concentration of SARS-CoV-2 viral copies this assay can detect is 138 copies/mL. A negative result does not preclude SARS-Cov-2 infection and should not be used as the sole basis for treatment or other patient management decisions. A negative result may occur with  improper specimen collection/handling, submission of specimen other than nasopharyngeal swab, presence of viral mutation(s) within the areas  targeted by this assay, and inadequate number of viral copies(<138 copies/mL). A negative result must be combined with clinical observations, patient history, and epidemiological information. The expected result is Negative.  Fact Sheet for Patients:  EntrepreneurPulse.com.au  Fact Sheet for Healthcare Providers:  IncredibleEmployment.be  This test is no t yet approved or cleared by the [place] FDA and  has been authorized for detection and/or diagnosis of SARS-CoV-2 by FDA under an Emergency Use Authorization (EUA). This EUA will remain  in effect (meaning this test can be used) for the duration of the COVID-19 declaration under Section 564(b)(1) of the Act, 21 U.S.C.section 360bbb-3(b)(1), unless the authorization is terminated  or revoked sooner.       Influenza A by PCR NEGATIVE NEGATIVE Final   Influenza B by PCR NEGATIVE NEGATIVE Final    Comment: (NOTE) The Xpert Xpress SARS-CoV-2/FLU/RSV plus assay is intended as an aid in the diagnosis of influenza from Nasopharyngeal swab specimens and should not be used as a sole basis for treatment. Nasal washings and aspirates are unacceptable for Xpert Xpress SARS-CoV-2/FLU/RSV testing.  Fact Sheet for Patients: EntrepreneurPulse.com.au  Fact Sheet for Healthcare Providers: IncredibleEmployment.be  This test is not yet approved or cleared by the [place] FDA and has been authorized for detection and/or diagnosis of  SARS-CoV-2 by FDA under an Emergency Use Authorization (EUA). This EUA will remain in effect (meaning this test can be used) for the duration of the COVID-19 declaration under Section 564(b)(1) of the Act, 21 U.S.C. section 360bbb-3(b)(1), unless the authorization is terminated or revoked.  Performed at [facility], [address], [place], [place] [address]          Radiology Studies: MR BRAIN WO  CONTRAST  Result Date: 08/14/2022 CLINICAL DATA:  Follow-up examination for stroke. EXAM: MRI HEAD WITHOUT CONTRAST TECHNIQUE: Multiplanar, multiecho pulse sequences of the brain and surrounding structures were obtained without intravenous contrast. COMPARISON:  Prior CT from earlier the same day. FINDINGS: Brain: Cerebral volume within normal limits. Scattered patchy T2/FLAIR hyperintensity involving the periventricular deep white matter of both cerebral hemispheres as well as the pons, consistent with chronic microvascular ischemic disease, mild for age. Few small remote lacunar infarcts noted at the left caudate head and ventral right thalamus. Patchy diffusion signal abnormality involving the cortical and subcortical aspect of the right frontoparietal region, with involvement of the pre and postcentral gyri, consistent with an acute to early subacute right MCA distribution infarct. No associated hemorrhage. There is an additional patchy small volume acute to early subacute left cerebellar infarct. Minimal petechial blood products at this level without hemorrhagic transformation or significant mass effect. Few additional punctate foci of acute to early subacute ischemia noted within the left temporal occipital region (series 5, images 19, 16). Gray-white matter differentiation otherwise maintained. No other acute or chronic intracranial blood products. No mass lesion or midline shift. No hydrocephalus or extra-axial fluid collection. Partially empty sella noted. Vascular: Major intracranial vascular flow voids are maintained. Skull and upper cervical spine: Craniocervical junction normal. Bone marrow signal intensity within normal limits. No scalp soft tissue abnormality. Sinuses/Orbits: Prior bilateral ocular lens replacement. Paranasal sinuses mastoid air cells are clear. Other: None. IMPRESSION: 1. Scattered acute to early subacute infarcts involving the posterior right frontal parietal region, left  cerebellum, and left temporoccipital region. Minimal petechial blood products at the left cerebellum without hemorrhagic transformation or significant regional mass effect. Given the various vascular distributions involved, a central thromboembolic etiology is likely. 2. Underlying chronic microvascular ischemic disease with a few small remote lacunar infarcts as above. Electronically Signed   By: Jeannine Boga M.D.   On: 08/14/2022 00:21   CT HEAD WO CONTRAST (5MM)  Result Date: 08/13/2022 CLINICAL DATA:  Dizziness.  Recent stroke. EXAM: CT HEAD WITHOUT CONTRAST TECHNIQUE: Contiguous axial images were obtained from the base of the skull through the vertex without intravenous contrast. RADIATION DOSE REDUCTION: This exam was performed according to the departmental dose-optimization program which includes automated exposure control, adjustment of the mA and/or kV according to patient size and/or use of iterative reconstruction technique. COMPARISON:  08/08/2022 FINDINGS: Brain: No evidence of intracranial hemorrhage, acute infarction, hydrocephalus, extra-axial collection, or mass lesion/mass effect. Old lacunar infarcts are again seen involving the left basal ganglia and left cerebellum. Vascular:  No hyperdense vessel or other acute findings. Skull: No evidence of fracture or other significant bone abnormality. Sinuses/Orbits:  No acute findings. Other: None. IMPRESSION: No acute intracranial abnormality. Old lacunar infarcts in left basal ganglia and left cerebellum. Electronically Signed   By: Marlaine Hind M.D.   On: 08/13/2022 17:51        Scheduled Meds:  [START ON 08/15/2022]  stroke: early stages of recovery book   Does not apply Once   ascorbic acid  250 mg Oral  Daily   aspirin  81 mg Oral Daily   atorvastatin  40 mg Oral Daily   cholecalciferol  5,000 Units Oral Q breakfast   clopidogrel  75 mg Oral Daily   vitamin B-12  500 mcg Oral Daily   cycloSPORINE  1 drop Both Eyes BID    enoxaparin (LOVENOX) injection  40 mg Subcutaneous Q24H   Ferrous Fumarate  1 tablet Oral Daily   gabapentin  400 mg Oral BID   pantoprazole  40 mg Oral Q breakfast   polyvinyl alcohol  1 drop Both Eyes QHS   topiramate  50 mg Oral QPM   vitamin E  400 Units Oral Q breakfast   Continuous Infusions:  sodium chloride 75 mL/hr at 08/14/22 0421     LOS: 0 days    Time spent: 35 Minutes.     Elmarie Shiley, MD [facility]   If 7PM-7AM, please contact night-coverage [email or web address]  08/14/2022, 7:57 AM

## 2022-08-14 NOTE — Consult Note (Signed)
Elizabeth Friedman is a 81 y.o. female  [id number]  Primary Cardiologist: Neoma Laming, MD Reason for Consultation: CVA  HPI: Elizabeth Friedman is a 81 y.o. female with a past medical history of gastric reflux, breast and endometrial cancer, hyperlipidemia, recently diagnosed with multiple small CVAs who presented to the emergency department for weakness and slurred speech.  Patient was recently admitted to the hospital on 08/07/2022, diagnosed with multiple small strokes on MRI.  Remainder the patient's work-up was largely nonrevealing and she was discharged home.  Since going home they have noted that the patient's symptoms have seemed to worsen at times with worsening dizziness or off-balance sensation as well as worsening slurred speech.  They noted today the speech seemed particularly worse however it is now gone back to her baseline that she was at at discharge.  Denies any weakness or numbness of any arm or leg.  States currently she feels well.   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[place])    Ductal carcinoma in situ (DCIS) of right breast    Fibrocystic breast disease    GERD (gastroesophageal reflux disease)    Hearing aid worn    bilateral   History of hiatal hernia    Hyperlipidemia    Lung nodule    Menopause    Migraine    Migraine    optical migraines   Motion sickness    Plantar fasciitis    Prolapse of female pelvic organs    hodge pessary   Raynaud's disease    Sciatic leg pain    Sciatica    Skin cancer of face    Stroke [facility])    Urinary retention with incomplete bladder emptying    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   Vitreous detachment of both eyes     (Not in a hospital admission)     [START ON 08/15/2022]  stroke: early stages of recovery  book   Does not apply Once   ascorbic acid  250 mg Oral Daily   aspirin  81 mg Oral Daily   atorvastatin  40 mg Oral Daily   cholecalciferol  5,000 Units Oral Q breakfast   clopidogrel  75 mg Oral Daily   vitamin B-12  500 mcg Oral Daily   cycloSPORINE  1 drop Both Eyes BID   enoxaparin (LOVENOX) injection  40 mg Subcutaneous Q24H   Ferrous Fumarate  1 tablet Oral Daily   gabapentin  400 mg Oral BID   pantoprazole  40 mg Oral Q breakfast   polyvinyl alcohol  1 drop Both Eyes QHS   topiramate  50 mg Oral QPM   vitamin E  400 Units Oral Q breakfast    Infusions:  sodium chloride 75 mL/hr at 08/14/22 0421    No Known Allergies  Social History   Socioeconomic History   Marital status: Widowed    Spouse name: Not on file   Number of children: Not on file   Years of education: Not on file   Highest education level: Not on file  Occupational History   Not on file  Tobacco Use   Smoking status: Never   Smokeless tobacco: Never  Vaping Use   Vaping Use:  Never used  Substance and Sexual Activity   Alcohol use: No   Drug use: No   Sexual activity: Not Currently    Birth control/protection: Post-menopausal  Other Topics Concern   Not on file  Social History Narrative   Lives alone   Social Determinants of Health   Financial Resource Strain: Not on file  Food Insecurity: No Food Insecurity (08/14/2022)   Hunger Vital Sign    Worried About Running Out of Food in the Last Year: Never true    Ran Out of Food in the Last Year: Never true  Transportation Needs: No Transportation Needs (08/14/2022)   PRAPARE - Hydrologist (Medical): No    Lack of Transportation (Non-Medical): No  Physical Activity: Sufficiently Active (12/21/2017)   Exercise Vital Sign    Days of Exercise per Week: 3 days    Minutes of Exercise per Session: 60 min  Stress: Not on file  Social Connections: Not on file  Intimate Partner Violence: Not At Risk (08/14/2022)    Humiliation, Afraid, Rape, and Kick questionnaire    Fear of Current or Ex-Partner: No    Emotionally Abused: No    Physically Abused: No    Sexually Abused: No    Family History  Problem Relation Age of Onset   Breast cancer Mother    Heart disease Father    Colon cancer Brother    Diabetes Neg Hx    Ovarian cancer Neg Hx     PHYSICAL EXAM: Vitals:   08/14/22 0800 08/14/22 0844  BP: 132/66   Pulse: 64   Resp: 18   Temp:  98.5 F (36.9 C)  SpO2: 96%     No intake or output data in the 24 hours ending 08/14/22 0857  General:  Well appearing. No respiratory difficulty HEENT: normal Neck: supple. no JVD. Carotids 2+ bilat; no bruits. No lymphadenopathy or thryomegaly appreciated. Cor: PMI nondisplaced. Regular rate & rhythm. No rubs, gallops or murmurs. Lungs: clear Abdomen: soft, nontender, nondistended. No hepatosplenomegaly. No bruits or masses. Good bowel sounds. Extremities: no cyanosis, clubbing, rash, edema Neuro: alert & oriented x 3, cranial nerves grossly intact. moves all 4 extremities w/o difficulty. Affect pleasant.  ECG: sinus rhythm, HR 66 bpm, non-specific ST changes  Results for orders placed or performed during the hospital encounter of 08/13/22 (from the past 24 hour(s))  CBC with Differential     Status: Abnormal   Collection Time: 08/13/22  5:15 PM  Result Value Ref Range   WBC 4.6 4.0 - 10.5 K/uL   RBC 3.33 (L) 3.87 - 5.11 MIL/uL   Hemoglobin 11.0 (L) 12.0 - 15.0 g/dL   HCT 32.5 (L) 36.0 - 46.0 %   MCV 97.6 80.0 - 100.0 fL   MCH 33.0 26.0 - 34.0 pg   MCHC 33.8 30.0 - 36.0 g/dL   RDW 11.5 11.5 - 15.5 %   Platelets 125 (L) 150 - 400 K/uL   nRBC 0.0 0.0 - 0.2 %   Neutrophils Relative % 60 %   Neutro Abs 2.8 1.7 - 7.7 K/uL   Lymphocytes Relative 26 %   Lymphs Abs 1.2 0.7 - 4.0 K/uL   Monocytes Relative 10 %   Monocytes Absolute 0.5 0.1 - 1.0 K/uL   Eosinophils Relative 3 %   Eosinophils Absolute 0.1 0.0 - 0.5 K/uL   Basophils Relative 1 %    Basophils Absolute 0.0 0.0 - 0.1 K/uL   Immature Granulocytes 0 %  Abs Immature Granulocytes 0.01 0.00 - 0.07 K/uL  Comprehensive metabolic panel     Status: Abnormal   Collection Time: 08/13/22  5:15 PM  Result Value Ref Range   Sodium 131 (L) 135 - 145 mmol/L   Potassium 4.1 3.5 - 5.1 mmol/L   Chloride 100 98 - 111 mmol/L   CO2 23 22 - 32 mmol/L   Glucose, Bld 91 70 - 99 mg/dL   BUN 18 8 - 23 mg/dL   Creatinine, Ser 0.87 0.44 - 1.00 mg/dL   Calcium 9.2 8.9 - 10.3 mg/dL   Total Protein 6.9 6.5 - 8.1 g/dL   Albumin 4.1 3.5 - 5.0 g/dL   AST 22 15 - 41 U/L   ALT 17 0 - 44 U/L   Alkaline Phosphatase 95 38 - 126 U/L   Total Bilirubin 0.6 0.3 - 1.2 mg/dL   GFR, Estimated >60 >60 mL/min   Anion gap 8 5 - 15  Troponin I (High Sensitivity)     Status: Abnormal   Collection Time: 08/13/22  5:15 PM  Result Value Ref Range   Troponin I (High Sensitivity) 89 (H) <18 ng/L  Urinalysis, Routine w reflex microscopic     Status: Abnormal   Collection Time: 08/13/22  8:54 PM  Result Value Ref Range   Color, Urine COLORLESS (A) YELLOW   APPearance CLEAR (A) CLEAR   Specific Gravity, Urine 1.006 1.005 - 1.030   pH 7.0 5.0 - 8.0   Glucose, UA NEGATIVE NEGATIVE mg/dL   Hgb urine dipstick NEGATIVE NEGATIVE   Bilirubin Urine NEGATIVE NEGATIVE   Ketones, ur NEGATIVE NEGATIVE mg/dL   Protein, ur NEGATIVE NEGATIVE mg/dL   Nitrite NEGATIVE NEGATIVE   Leukocytes,Ua NEGATIVE NEGATIVE   RBC / HPF 0-5 0 - 5 RBC/hpf   WBC, UA 0-5 0 - 5 WBC/hpf   Bacteria, UA NONE SEEN NONE SEEN   Squamous Epithelial / LPF NONE SEEN 0 - 5   Mucus PRESENT   Troponin I (High Sensitivity)     Status: Abnormal   Collection Time: 08/13/22  9:26 PM  Result Value Ref Range   Troponin I (High Sensitivity) 89 (H) <18 ng/L  Lipid panel     Status: None   Collection Time: 08/14/22  5:00 AM  Result Value Ref Range   Cholesterol 152 0 - 200 mg/dL   Triglycerides 91 <150 mg/dL   HDL 48 >40 mg/dL   Total CHOL/HDL Ratio 3.2  RATIO   VLDL 18 0 - 40 mg/dL   LDL Cholesterol 86 0 - 99 mg/dL   MR BRAIN WO CONTRAST  Result Date: 08/14/2022 CLINICAL DATA:  Follow-up examination for stroke. EXAM: MRI HEAD WITHOUT CONTRAST TECHNIQUE: Multiplanar, multiecho pulse sequences of the brain and surrounding structures were obtained without intravenous contrast. COMPARISON:  Prior CT from earlier the same day. FINDINGS: Brain: Cerebral volume within normal limits. Scattered patchy T2/FLAIR hyperintensity involving the periventricular deep white matter of both cerebral hemispheres as well as the pons, consistent with chronic microvascular ischemic disease, mild for age. Few small remote lacunar infarcts noted at the left caudate head and ventral right thalamus. Patchy diffusion signal abnormality involving the cortical and subcortical aspect of the right frontoparietal region, with involvement of the pre and postcentral gyri, consistent with an acute to early subacute right MCA distribution infarct. No associated hemorrhage. There is an additional patchy small volume acute to early subacute left cerebellar infarct. Minimal petechial blood products at this level without hemorrhagic transformation or significant  mass effect. Few additional punctate foci of acute to early subacute ischemia noted within the left temporal occipital region (series 5, images 19, 16). Gray-white matter differentiation otherwise maintained. No other acute or chronic intracranial blood products. No mass lesion or midline shift. No hydrocephalus or extra-axial fluid collection. Partially empty sella noted. Vascular: Major intracranial vascular flow voids are maintained. Skull and upper cervical spine: Craniocervical junction normal. Bone marrow signal intensity within normal limits. No scalp soft tissue abnormality. Sinuses/Orbits: Prior bilateral ocular lens replacement. Paranasal sinuses mastoid air cells are clear. Other: None. IMPRESSION: 1. Scattered acute to early  subacute infarcts involving the posterior right frontal parietal region, left cerebellum, and left temporoccipital region. Minimal petechial blood products at the left cerebellum without hemorrhagic transformation or significant regional mass effect. Given the various vascular distributions involved, a central thromboembolic etiology is likely. 2. Underlying chronic microvascular ischemic disease with a few small remote lacunar infarcts as above. Electronically Signed   By: Jeannine Boga M.D.   On: 08/14/2022 00:21   CT HEAD WO CONTRAST (5MM)  Result Date: 08/13/2022 CLINICAL DATA:  Dizziness.  Recent stroke. EXAM: CT HEAD WITHOUT CONTRAST TECHNIQUE: Contiguous axial images were obtained from the base of the skull through the vertex without intravenous contrast. RADIATION DOSE REDUCTION: This exam was performed according to the departmental dose-optimization program which includes automated exposure control, adjustment of the mA and/or kV according to patient size and/or use of iterative reconstruction technique. COMPARISON:  08/08/2022 FINDINGS: Brain: No evidence of intracranial hemorrhage, acute infarction, hydrocephalus, extra-axial collection, or mass lesion/mass effect. Old lacunar infarcts are again seen involving the left basal ganglia and left cerebellum. Vascular:  No hyperdense vessel or other acute findings. Skull: No evidence of fracture or other significant bone abnormality. Sinuses/Orbits:  No acute findings. Other: None. IMPRESSION: No acute intracranial abnormality. Old lacunar infarcts in left basal ganglia and left cerebellum. Electronically Signed   By: Marlaine Hind M.D.   On: 08/13/2022 17:51     ASSESSMENT AND PLAN: Patient presented to ED on 08/07/22 for slurred speech, vision changes, nausea and dizziness. MRI showed scattered punctate foci of acute ischemia within both cerebellar hemispheres, left basal ganglia and both frontal lobes, consistent with emboli. Transthoracic  echocardiogram was unremarkable for a cardioembolic source, lower extremity Dopplers were negative for DVT. Patient was sent home with a Holter monitor. Patient returned to ED on 08/13/22 with similar symptoms. Repeat imaging with new strokes on MRI. Will schedule patient for TEE to better look for embolic source. Discussed TEE with patient, risks and benefits discussed. Patient agreeable. Patient scheduled for Monday, 08/17/22 at 8:30 am.   Elsworth Soho FNP-C

## 2022-08-14 NOTE — Evaluation (Addendum)
Physical Therapy Evaluation Patient Details Name: Elizabeth Friedman MRN: [id number] DOB: Mar 25, 1941 Today's Date: 08/14/2022  History of Present Illness  Pt is an 81 year old female admitted  with recurrent subacute to acute CVA, concerning for central involvement source after presenting to ED with dizziness and right-sided paresthesias; PMH significant for past history of breast and uterine cancer, recurrent CVA, dyslipidemia, GERD, Osteoarthritis, vitamin B12 deficiency and migraine   Clinical Impression  Pt received in Semi-Fowler's position and agreeable to therapy.  Pt notes she is feeling much better today than she was when she was admitted.  Pt performed bed level exercises without much difficulty.  Pt then able to perform transfer with good technique and come into standing position.  Pt then transitioned to gait training around the ED.  Pt mobilizes well with RW and much more safely with use of the AD than without.  Pt mobilizes well enough that therapist feels comfortable with pt mobilizing with family members or nursing staff, just advised family members to notify nursing prior to attempting.  Nursing also notified of recommendation and was supportive.  Pt left in bed with all needs met.  Current discharge plans to home with HHPT remain appropriate at this time.  Pt will continue to benefit from skilled therapy in order to address deficits listed below.        Recommendations for follow up therapy are one component of a multi-disciplinary discharge planning process, led by the attending physician.  Recommendations may be updated based on patient status, additional functional criteria and insurance authorization.  Follow Up Recommendations Home health PT      Assistance Recommended at Discharge Intermittent Supervision/Assistance  Patient can return home with the following  Assistance with cooking/housework;Assist for transportation;Help with stairs or ramp for entrance    Equipment  Recommendations Rollator (4 wheels)  Recommendations for Other Services       Functional Status Assessment Patient has had a recent decline in their functional status and demonstrates the ability to make significant improvements in function in a reasonable and predictable amount of time.     Precautions / Restrictions Precautions Precautions: Fall Restrictions Weight Bearing Restrictions: No      Mobility  Bed Mobility Overal bed mobility: Needs Assistance Bed Mobility: Supine to Sit, Sit to Supine     Supine to sit: Supervision Sit to supine: Supervision        Transfers Overall transfer level: Needs assistance Equipment used: Rolling walker (2 wheels) Transfers: Sit to/from Stand Sit to Stand: Supervision           General transfer comment: Pt is able to perfrom STs without excessive UE use, no AD and can maintain her balance without issue.    Ambulation/Gait Ambulation/Gait assistance: Min guard Gait Distance (Feet): 300 Feet Assistive device: Rolling walker (2 wheels) Gait Pattern/deviations: WFL(Within Functional Limits) Gait velocity: slightly decreased     General Gait Details: Pt able to ambulate well with the RW, no LOB noted with pt reporting pace to be appropriate for tasks at home.  Stairs         General stair comments: will attempt at later session if possible, but has railing on the R at home if necessary.  Wheelchair Mobility    Modified Rankin (Stroke Patients Only)       Balance Overall balance assessment: Needs assistance Sitting-balance support: Feet supported Sitting balance-Leahy Scale: Good     Standing balance support: Bilateral upper extremity supported Standing balance-Leahy Scale: Good  Pertinent Vitals/Pain Pain Assessment Pain Assessment: No/denies pain    Home Living Family/patient expects to be discharged to:: Private residence Living Arrangements: Alone Available  Help at Discharge: Family;Available 24 hours/day (family has been staying 24/7 since recent admission) Type of Home: House Home Access: Stairs to enter Entrance Stairs-Rails: Right Entrance Stairs-Number of Steps: 2-3 steps on the side with pull bar   Home Layout: One level Home Equipment: Shower seat - built in;Hand held Chief Technology Officer (4 wheels)      Prior Function Prior Level of Function : Independent/Modified Independent;Driving             Mobility Comments: amb with rollator since previous admission ADLs Comments: prior to previous admission, indep in ADL/IADL; light assist for ADLs, assist for IADLs after previous admission     Hand Dominance   Dominant Hand: Right    Extremity/Trunk Assessment   Upper Extremity Assessment Upper Extremity Assessment: Generalized weakness    Lower Extremity Assessment Lower Extremity Assessment: Generalized weakness    Cervical / Trunk Assessment Cervical / Trunk Assessment: Normal  Communication   Communication: No difficulties  Cognition Arousal/Alertness: Awake/alert Behavior During Therapy: WFL for tasks assessed/performed Overall Cognitive Status: Within Functional Limits for tasks assessed                                 General Comments: somewhat HOH        General Comments      Exercises Total Joint Exercises Ankle Circles/Pumps: AROM, Strengthening, Both, 10 reps, Supine Quad Sets: AROM, Strengthening, Both, 10 reps, Supine Gluteal Sets: AROM, Strengthening, Both, 10 reps, Supine Hip ABduction/ADduction: AROM, Strengthening, Both, 10 reps, Supine Straight Leg Raises: AROM, Strengthening, Both, 10 reps, Supine   Assessment/Plan    PT Assessment Patient needs continued PT services  PT Problem List Decreased activity tolerance;Decreased balance;Decreased knowledge of use of DME;Decreased safety awareness       PT Treatment Interventions DME instruction;Gait training;Functional mobility  training;Therapeutic activities;Stair training;Therapeutic exercise;Balance training;Neuromuscular re-education;Patient/family education    PT Goals (Current goals can be found in the Care Plan section)  Acute Rehab PT Goals Patient Stated Goal: get back home PT Goal Formulation: With patient Time For Goal Achievement: 08/28/22 Potential to Achieve Goals: Good    Frequency Min 2X/week     Co-evaluation               AM-PAC PT "6 Clicks" Mobility  Outcome Measure Help needed turning from your back to your side while in a flat bed without using bedrails?: None Help needed moving from lying on your back to sitting on the side of a flat bed without using bedrails?: None Help needed moving to and from a bed to a chair (including a wheelchair)?: None Help needed standing up from a chair using your arms (e.g., wheelchair or bedside chair)?: None Help needed to walk in hospital room?: None Help needed climbing 3-5 steps with a railing? : None 6 Click Score: 24    End of Session Equipment Utilized During Treatment: Gait belt Activity Tolerance: Patient tolerated treatment well Patient left: with bed alarm set;with call bell/phone within reach Nurse Communication: Mobility status PT Visit Diagnosis: Difficulty in walking, not elsewhere classified (R26.2);Other symptoms and signs involving the nervous system (R29.898);Unsteadiness on feet (R26.81);Dizziness and giddiness (R42)    Time: 3557-3220 PT Time Calculation (min) (ACUTE ONLY): 30 min   Charges:   PT Evaluation $PT Eval Low  Complexity: 1 Low PT Treatments $Gait Training: 8-22 mins        Gwenlyn Saran, PT, DPT Physical Therapist- [facility]  08/14/22, 1:18 PM

## 2022-08-14 NOTE — ED Notes (Signed)
Patient resting in bed free from sign of distress. Breathing unlabored  with symmetric chest rise and fall. Bed low and locked with side rails raised x2. Call bell in reach and monitor in place.   

## 2022-08-14 NOTE — ED Notes (Signed)
PT at bedside at this time 

## 2022-08-14 NOTE — ED Notes (Signed)
Lunch meal tray given delivered by dietary

## 2022-08-14 NOTE — Progress Notes (Signed)
PHARMACIST - PHYSICIAN ORDER COMMUNICATION  CONCERNING: P&T Medication Policy on Herbal Medications  DESCRIPTION:  This patient's order(s) for: Coenzyme Q 200 mg has been noted.  This product(s) is classified as an "herbal" or natural product. Due to a lack of definitive safety studies or FDA approval, nonstandard manufacturing practices, plus the potential risk of unknown drug-drug interactions while on inpatient medications, the Pharmacy and Therapeutics Committee does not permit the use of "herbal" or natural products of this type within [facility].   ACTION TAKEN: The pharmacy department is unable to verify this order at this time.  Please reevaluate patient's clinical condition at discharge and address if the herbal or natural product(s) should be resumed at that time.  Renda Rolls, PharmD, [facility] 08/14/2022 3:37 AM

## 2022-08-14 NOTE — ED Notes (Signed)
Pt ambulatory to restroom, with standby assistance.

## 2022-08-14 NOTE — Plan of Care (Signed)

## 2022-08-14 NOTE — ED Notes (Signed)
Pt called out for assistance to bathroom. Pt assisted to toilet and pt voided independently. Pt positioned back in bed for comfort. Cardiac monitor put back in place and blankets re-applied. Pt bed low and locked with side rails raised x2. Call bell in reach. Lights dimmed per pt request. Pt son remains at bedside.

## 2022-08-14 NOTE — Assessment & Plan Note (Signed)
-   We will continue PPI therapy 

## 2022-08-14 NOTE — ED Notes (Signed)
Pt heat pad from home warmed per her request and placed on abdomen with gown in between as buffer for skin. Pt denies further needs at this time.

## 2022-08-14 NOTE — ED Provider Notes (Signed)
Patient received in signout from Dr. Kerman Passey pending MRI brain to evaluate for new stroke in the setting of waxing and waning dizziness, right-sided sensorium changes and recent admission for embolic stroke of uncertain etiology.  This MRI does demonstrate multiple areas of acute/subacute infarcts.  I consult with the radiologist and clarified that there are a couple areas of novel infarcts compared to the MRI from 1 week ago, though they do look a few days old already.  I further consult with neurology, Dr. Cheral Marker, who recommends admission for TEE and continued evaluation for atrial fibrillation.  I will consult with medicine for admission.  I relayed these recommendations to the patient and her son who is at the bedside, and they are agreeable to stay for medical admission.  We discussed n.p.o. status for procedures.  .Critical Care  Performed by: Vladimir Crofts, MD Authorized by: Vladimir Crofts, MD   Critical care provider statement:    Critical care time (minutes):  30   Critical care time was exclusive of:  Separately billable procedures and treating other patients   Critical care was necessary to treat or prevent imminent or life-threatening deterioration of the following conditions:  CNS failure or compromise   Critical care was time spent personally by me on the following activities:  Development of treatment plan with patient or surrogate, discussions with consultants, evaluation of patient's response to treatment, examination of patient, ordering and review of laboratory studies, ordering and review of radiographic studies, ordering and performing treatments and interventions, pulse oximetry, re-evaluation of patient's condition and review of old charts     Vladimir Crofts, MD 08/14/22 0201

## 2022-08-14 NOTE — Assessment & Plan Note (Signed)
-   We will continue her Topamax.

## 2022-08-14 NOTE — Progress Notes (Signed)
Received patient from ED via bed.  Patient is alert and oriented x 4, son at bedside.  Neuro assessment done on arrival to room.  All extremities with equal strength, no neuro deficit noted at this time.  Patient denies pain, no complaints.  Oriented to room and unit routine.  Call bell within reach.  Needs addressed.

## 2022-08-14 NOTE — ED Notes (Signed)
Physician at bedside at this time  

## 2022-08-14 NOTE — Consult Note (Signed)
Neurology Consultation  Reason for Consult: Stroke Referring Physician: Dr. Sidney Ace  CC: Dizziness, new strokes on MRI  History is obtained from: Patient, chart  HPI: Elizabeth Friedman is a 81 y.o. female recently discharged for acute ischemic strokes of cryptogenic etiology, coming back with new complaints of dizziness and repeat imaging with new strokes on MRI. Briefly, she is 81 year old woman with past history of breast and uterine cancer who presented with vertigo and was found to have multifocal acute ischemic infarcts in the anterior and posterior circulation consistent with a central embolic source.  Her transthoracic echo was unremarkable, there was no evidence of lower extremity DVT.  Her head and neck vessel imaging showed a severe stenosis/occlusion of the left P1 and severe stenosis of the left P3 with moderate stenosis of the distal basilar artery but no anterior circulation narrowing.  She was discharged home on dual antiplatelets with recommendations for 30-day cardiac monitoring and discussion to be had with oncologist outpatient regarding appropriateness of anticoagulation if the strokes were deemed to be from hypercoagulability.  She has not completed any of the follow-ups yet but had a sudden onset of dizziness (describes as disequilibrium) and worsening of right arm and leg tingling that brought her to the hospital.  Repeat imaging showed new strokes-as described below. She has been admitted for further work-up.   LKW: Unclear IV thrombolysis given?: no, recent strokes less than a week ago Premorbid modified Rankin scale (mRS): 2  ROS: Full ROS was performed and is negative except as noted in the HPI.  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[place])    Ductal carcinoma in situ (DCIS) of right breast    Fibrocystic breast disease    GERD  (gastroesophageal reflux disease)    Hearing aid worn    bilateral   History of hiatal hernia    Hyperlipidemia    Lung nodule    Menopause    Migraine    Migraine    optical migraines   Motion sickness    Plantar fasciitis    Prolapse of female pelvic organs    hodge pessary   Raynaud's disease    Sciatic leg pain    Sciatica    Skin cancer of face    Stroke [facility])    Urinary retention with incomplete bladder emptying    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 She has never used smokeless tobacco. She reports that she does not drink alcohol and does not use drugs.  Medications  Current Facility-Administered Medications:    [START ON 08/15/2022]  stroke: early stages of recovery book, , Does not apply, Once, Mansy, Jan A, MD   0.9 %  sodium chloride infusion, , Intravenous, Continuous, Mansy, Jan A, MD, Last Rate: 75 mL/hr at 08/14/22 0421, New Bag at 08/14/22 0421   acetaminophen (TYLENOL) tablet 650 mg, 650 mg, Oral, Q4H PRN **OR** acetaminophen (TYLENOL) 160 MG/5ML solution 650 mg, 650 mg,  Per Tube, Q4H PRN **OR** acetaminophen (TYLENOL) suppository 650 mg, 650 mg, Rectal, Q4H PRN, Mansy, Jan A, MD   ascorbic acid (VITAMIN C) tablet 250 mg, 250 mg, Oral, Daily, Mansy, Jan A, MD   aspirin chewable tablet 81 mg, 81 mg, Oral, Daily, Mansy, Jan A, MD   atorvastatin (LIPITOR) tablet 40 mg, 40 mg, Oral, Daily, Mansy, Jan A, MD   cholecalciferol (VITAMIN D3) tablet 5,000 Units, 5,000 Units, Oral, Q breakfast, Mansy, Jan A, MD   clopidogrel (PLAVIX) tablet 75 mg, 75 mg, Oral, Daily, Mansy, Jan A, MD   cyanocobalamin (VITAMIN B12) tablet 500 mcg, 500 mcg, Oral, Daily, Mansy, Jan A, MD   cycloSPORINE (RESTASIS) 0.05 %  ophthalmic emulsion 1 drop, 1 drop, Both Eyes, BID, Mansy, Jan A, MD   enoxaparin (LOVENOX) injection 40 mg, 40 mg, Subcutaneous, Q24H, Mansy, Jan A, MD   Ferrous Fumarate (HEMOCYTE - 106 mg FE) tablet 106 mg of iron, 1 tablet, Oral, Daily, Mansy, Jan A, MD   gabapentin (NEURONTIN) capsule 400 mg, 400 mg, Oral, BID, Mansy, Jan A, MD   hydrALAZINE (APRESOLINE) injection 10 mg, 10 mg, Intravenous, Q4H PRN, Regalado, Belkys A, MD   labetalol (NORMODYNE) injection 5 mg, 5 mg, Intravenous, Q2H PRN, Regalado, Belkys A, MD   ondansetron (ZOFRAN) injection 4 mg, 4 mg, Intravenous, Q4H PRN, Mansy, Jan A, MD   pantoprazole (PROTONIX) EC tablet 40 mg, 40 mg, Oral, Q breakfast, Mansy, Jan A, MD   polyvinyl alcohol (LIQUIFILM TEARS) 1.4 % ophthalmic solution 1 drop, 1 drop, Both Eyes, QHS, Mansy, Jan A, MD   senna-docusate (Senokot-S) tablet 1 tablet, 1 tablet, Oral, QHS PRN, Mansy, Jan A, MD   topiramate (TOPAMAX) tablet 50 mg, 50 mg, Oral, QPM, Mansy, Jan A, MD   traZODone (DESYREL) tablet 25 mg, 25 mg, Oral, QHS PRN, Mansy, Jan A, MD   triamcinolone (NASACORT) nasal inhaler 2 spray, 2 spray, Each Nare, Daily PRN, Mansy, Arvella Merles, MD   Vitamin E CAPS 400 Units, 400 Units, Oral, Q breakfast, Mansy, Jan A, MD  Current Outpatient Medications:    ascorbic acid (VITAMIN C) 250 MG tablet, Take 1 tablet (250 mg total) by mouth daily., Disp: 30 tablet, Rfl: 0   aspirin 81 MG chewable tablet, Chew 1 tablet (81 mg total) by mouth daily., Disp: 30 tablet, Rfl: 0   atorvastatin (LIPITOR) 40 MG tablet, Take 1 tablet (40 mg total) by mouth daily., Disp: 30 tablet, Rfl: 0   Calcium-Vitamin D (CALTRATE 600 PLUS-VIT D PO), Take 1 tablet by mouth in the morning., Disp: , Rfl:    Carboxymethylcellulose Sod PF 1 % GEL, Place 1 drop into both eyes at bedtime., Disp: , Rfl:    celecoxib (CELEBREX) 200 MG capsule, Take 200 mg by mouth 2 (two) times daily., Disp: , Rfl:    Cholecalciferol (VITAMIN D) 125 MCG (5000 UT) CAPS, Take  5,000 Units by mouth in the morning., Disp: , Rfl:    clopidogrel (PLAVIX) 75 MG tablet, Take 1 tablet (75 mg total) by mouth daily. PLAVIX + ASPIRIN FOR 90 DAYS, THEN ASPIRIN ALONE INDEFINITELY, Disp: 90 tablet, Rfl: 0   Coenzyme Q10 200 MG TABS, Take 200 mg by mouth in the morning., Disp: , Rfl:    Cyanocobalamin (VITAMIN B-12 PO), Take 1 tablet by mouth in the morning., Disp: , Rfl:    Ferrous Fumarate (HEMOCYTE - 106 MG FE) 324 (106 Fe) MG TABS tablet, Take 1 tablet (106 mg of iron total) by mouth  daily., Disp: 30 tablet, Rfl: 0   gabapentin (NEURONTIN) 400 MG capsule, Take 1 capsule (400 mg total) by mouth 2 (two) times daily., Disp: , Rfl:    pantoprazole (PROTONIX) 40 MG tablet, Take 40 mg by mouth in the morning., Disp: , Rfl:    RESTASIS 0.05 % ophthalmic emulsion, Place 1 drop into both eyes 2 (two) times daily., Disp: , Rfl:    topiramate (TOPAMAX) 50 MG tablet, Take 50 mg by mouth every evening., Disp: , Rfl:    vitamin E 180 MG (400 UNITS) capsule, Take 400 Units by mouth in the morning., Disp: , Rfl:    acetaminophen (TYLENOL) 500 MG tablet, Take 1,000 mg by mouth every 6 (six) hours as needed for moderate pain or mild pain., Disp: , Rfl:    alum & mag hydroxide-simeth (MAALOX/MYLANTA) 200-200-20 MG/5ML suspension, Take 30 mLs by mouth every 6 (six) hours as needed for indigestion or heartburn. (Patient not taking: Reported on 08/05/2022), Disp: , Rfl:    magnesium hydroxide (MILK OF MAGNESIA) 400 MG/5ML suspension, Take by mouth daily as needed for mild constipation., Disp: , Rfl:    Triamcinolone Acetonide (NASACORT ALLERGY 24HR NA), Place 1 spray into the nose daily as needed (sinus congestion)., Disp: , Rfl:    Exam: Current vital signs: BP 132/66   Pulse 64   Temp 98.5 F (36.9 C) (Oral)   Resp 18   SpO2 96%  Vital signs in last 24 hours: Temp:  [97.7 F (36.5 C)-98.5 F (36.9 C)] 98.5 F (36.9 C) (10/20 0844) Pulse Rate:  [60-80] 64 (10/20 0800) Resp:  [10-29] 18  (10/20 0800) BP: (122-182)/(66-101) 132/66 (10/20 0800) SpO2:  [90 %-100 %] 96 % (10/20 0800) Gen: Awake alert in no distress HEENT: Normocephalic atraumatic Lungs: Clear Cardiovascular: Regular rate rhythm Abdomen nondistended nontender Extremities warm well perfused without any evidence of edema Neurological exam She is awake alert oriented x3 There is mild dysarthria No evidence of aphasia-able to provide clear history. Cranial nerves II to XII intact. Motor examination did not reveal drift in any of the 4 extremities.  She is full strength in all 4 extremities with 5/5 strength. Sensation is intact to light touch without extinction but she reports feeling tingly in her right leg and right arm. Coordination exam-no dysmetria Gait testing was deferred at this time NIH stroke scale-1 for sensory at best.   Labs I have reviewed labs in epic and the results pertinent to this consultation are:  CBC    Component Value Date/Time   WBC 4.6 08/13/2022 1715   RBC 3.33 (L) 08/13/2022 1715   HGB 11.0 (L) 08/13/2022 1715   HGB 11.8 (L) 05/22/2020 0804   HCT 32.5 (L) 08/13/2022 1715   PLT 125 (L) 08/13/2022 1715   PLT 223 05/22/2020 0804   MCV 97.6 08/13/2022 1715   MCH 33.0 08/13/2022 1715   MCHC 33.8 08/13/2022 1715   RDW 11.5 08/13/2022 1715   LYMPHSABS 1.2 08/13/2022 1715   MONOABS 0.5 08/13/2022 1715   EOSABS 0.1 08/13/2022 1715   BASOSABS 0.0 08/13/2022 1715    CMP     Component Value Date/Time   NA 131 (L) 08/13/2022 1715   K 4.1 08/13/2022 1715   CL 100 08/13/2022 1715   CO2 23 08/13/2022 1715   GLUCOSE 91 08/13/2022 1715   BUN 18 08/13/2022 1715   CREATININE 0.87 08/13/2022 1715   CREATININE 1.03 (H) 05/22/2020 0804   CALCIUM 9.2 08/13/2022 1715   PROT 6.9  08/13/2022 1715   ALBUMIN 4.1 08/13/2022 1715   AST 22 08/13/2022 1715   AST 17 05/22/2020 0804   ALT 17 08/13/2022 1715   ALT 11 05/22/2020 0804   ALKPHOS 95 08/13/2022 1715   BILITOT 0.6 08/13/2022  1715   BILITOT 0.3 05/22/2020 0804   GFRNONAA >60 08/13/2022 1715   GFRNONAA 52 (L) 05/22/2020 0804   GFRAA 60 (L) 05/22/2020 0804    Lipid Panel     Component Value Date/Time   CHOL 152 08/14/2022 0500   TRIG 91 08/14/2022 0500   HDL 48 08/14/2022 0500   CHOLHDL 3.2 08/14/2022 0500   VLDL 18 08/14/2022 0500   LDLCALC 86 08/14/2022 0500   A1c-5.7 LDL-94  Imaging I have reviewed the images obtained:  MR of the brain: Scattered acute to early subacute infarcts in the posterior right frontoparietal region, left cerebellum and left temporal occipital region with minimal petechial blood products at the left cerebellum without hemorrhagic transformation. For additional punctate foci of acute early to subacute ischemia noted within left temporal occipital region which were not seen on the prior scan.  Small volume acute to early subacute right frontoparietal region which is also new from before.  See the angiography head and neck from last admission showed short segment severe stenosis/occlusion of the left P1 segment with reconstitution.  Severe stenosis or short segment occlusion of the proximal left P3.  Moderate stenosis of the distal basilar artery.  Mild bilateral carotid bifurcation atherosclerosis without significant stenosis.  Lower extremity venous Dopplers did not reveal any evidence of DVT at that time.  2D echocardiogram showed LVEF 60 to 65%, normal mitral valve, normal aortic valve, normal left atrial size and no atrial level shunt with bubble study.    Assessment: 81 year old with past history of breast and uterine cancer presenting last week with vertigo and found to have multifocal acute ischemic strokes in the anterior and posterior circulation, discharged home and presenting back with dizziness with repeat MRI showing new acute ischemic infarcts in bilateral anterior posterior circulations raising concern for a central source. Transthoracic echocardiogram few days ago  was unremarkable for a cardioembolic source Lower extremity Dopplers are negative for DVT Labs reveal elevated LDL for which she has been started on statin A1c is within goal CT angiography of the head and neck did not reveal any significant stenosis in the anterior circulation although posterior circulation has multiple stenotic areas as above. All in all, her clinical presentation is consistent with a central cardioembolic source versus hypercoagulability causing strokes given the history of cancers. She has gotten most of the work-up done except for a transesophageal echocardiogram which will be done on Monday.  Impression: Acute ischemic stroke-etiology remains cryptogenic Strong suspicion for cardiac source versus hypercoagulability History of breast and uterine cancer  Recommendations: Continue dual antiplatelets for now Appreciate cardiology putting her on scheduled for a TEE on Monday Recommend consulting oncology for input on hypercoagulability.  If she needs anticoagulation from a hypercoagulable standpoint, then there would be no need for a TEE because the TEE results are not limited change the management at that time. Continue telemetry If possible, interrogate the 30-day cardiac monitor to see if there is any evidence of atrial fibrillation.  If there is evidence of atrial fibrillation, she will need to be on anticoagulation and then TEE again might not be necessary. Plan was discussed at bedside with the patient, her son as well as Dr. Tyrell Antonio.  -- Amie Portland, MD Neurologist [facility]  Pager: [phone]

## 2022-08-14 NOTE — ED Notes (Signed)
Breakfast delivered by dietary at this time.

## 2022-08-14 NOTE — Progress Notes (Signed)
SLP Cancellation Note  Patient Details Name: Tomeeka H Zanders MRN: [id number] DOB: 08/02/1941   Cancelled treatment:       Reason Eval/Treat Not Completed: SLP screened, no needs identified, will sign off (chart reviewed; consulted NSG then met w/ pt and family in room.) Pt denied any difficulty swallowing and is currently on a regular diet; tolerates swallowing Pills w/ water but prefers to use V8 juice and/or Yogurt for ease of swallowing Pills. Menu provided to pt/family for ease of ordering foods during admit  Pt conversed in conversation w/out overt expressive/receptive deficits noted; pt denied any speech-language deficits. Speech clear. No further skilled ST services indicated as pt appears at her baseline. Pt agreed. NSG to reconsult if any change in status while admitted.        , MS, CCC-SLP Speech Language Pathologist [facility]; [facility] [phone] (ascom) , 08/14/2022, 9:46 AM 

## 2022-08-14 NOTE — ED Notes (Signed)
Patient transferred to hospital bed to promote comfort. Cardiac monitor remains in place. Bed low and locked with side rails raised x3. Call bell in reach. Pt denies pain or other needs at this time.

## 2022-08-14 NOTE — ED Notes (Signed)
Patient resting in bed free from sign of distress. Breathing unlabored speaking in full sentences with symmetric chest rise and fall. Bed low and locked with side rails raised x2. Call bell in reach and monitor in place.   

## 2022-08-14 NOTE — ED Notes (Signed)
Patient assisted to and from toilet in room. Pt positioned back in bed for comfort and cardiac monitor put back in place. Pt breathing unlabored speaking in full sentences with symmetric chest rise and fall. Bed low and locked with side rails raised x3. Call bell in reach. Pt denies further needs at this time.

## 2022-08-14 NOTE — ED Notes (Signed)
Report received from McKenzie, RN.

## 2022-08-15 DIAGNOSIS — I639 Cerebral infarction, unspecified: Secondary | ICD-10-CM | POA: Diagnosis not present

## 2022-08-15 LAB — ANTITHROMBIN III: AntiThromb III Func: 106 % (ref 75–120)

## 2022-08-15 LAB — PROTIME-INR
INR: 1.2 (ref 0.8–1.2)
Prothrombin Time: 14.9 seconds (ref 11.4–15.2)

## 2022-08-15 LAB — APTT: aPTT: 26 seconds (ref 24–36)

## 2022-08-15 MED ORDER — HYDRALAZINE HCL 20 MG/ML IJ SOLN: 10.0000 mg | INTRAMUSCULAR | Status: DC | PRN

## 2022-08-15 MED ORDER — LABETALOL HCL 5 MG/ML IV SOLN: 5.0000 mg | INTRAVENOUS | Status: DC | PRN

## 2022-08-15 NOTE — Progress Notes (Signed)
SUBJECTIVE: Elizabeth Friedman is a 81 y.o. female with a past medical history of gastric reflux, breast and endometrial cancer, hyperlipidemia, recently diagnosed with multiple small CVAs who presented to the emergency department for weakness and slurred speech.  Patient was recently admitted to the hospital on 08/07/2022, diagnosed with multiple small strokes on MRI.  Remainder the patient's work-up was largely nonrevealing and she was discharged home.  Since going home they have noted that the patient's symptoms have seemed to worsen at times with worsening dizziness or off-balance sensation as well as worsening slurred speech.  They noted today the speech seemed particularly worse however it is now gone back to her baseline that she was at at discharge.  Denies any weakness or numbness of any arm or leg.  States currently she feels well.   Vitals:   08/14/22 2119 08/15/22 0007 08/15/22 0500 08/15/22 0751  BP: (!) 156/88 (!) 144/78 137/73 (!) 150/87  Pulse: 74 60 64 70  Resp: '16 16 16 16  '$ Temp: 97.9 F (36.6 C) 97.8 F (36.6 C) 97.9 F (36.6 C) 97.8 F (36.6 C)  TempSrc:  Oral Oral   SpO2: 99% 98% 98% 100%  Weight:      Height:        Intake/Output Summary (Last 24 hours) at 08/15/2022 1103 Last data filed at 08/15/2022 0511 Gross per 24 hour  Intake 1579.26 ml  Output --  Net 1579.26 ml    LABS: Basic Metabolic Panel: Recent Labs    08/13/22 1715  NA 131*  K 4.1  CL 100  CO2 23  GLUCOSE 91  BUN 18  CREATININE 0.87  CALCIUM 9.2   Liver Function Tests: Recent Labs    08/13/22 1715  AST 22  ALT 17  ALKPHOS 95  BILITOT 0.6  PROT 6.9  ALBUMIN 4.1   No results for input(s): "LIPASE", "AMYLASE" in the last 72 hours. CBC: Recent Labs    08/13/22 1715  WBC 4.6  NEUTROABS 2.8  HGB 11.0*  HCT 32.5*  MCV 97.6  PLT 125*   Cardiac Enzymes: No results for input(s): "CKTOTAL", "CKMB", "CKMBINDEX", "TROPONINI" in the last 72 hours. BNP: Invalid input(s):  "POCBNP" D-Dimer: No results for input(s): "DDIMER" in the last 72 hours. Hemoglobin A1C: No results for input(s): "HGBA1C" in the last 72 hours. Fasting Lipid Panel: Recent Labs    08/14/22 0500  CHOL 152  HDL 48  LDLCALC 86  TRIG 91  CHOLHDL 3.2   Thyroid Function Tests: No results for input(s): "TSH", "T4TOTAL", "T3FREE", "THYROIDAB" in the last 72 hours.  Invalid input(s): "FREET3" Anemia Panel: No results for input(s): "VITAMINB12", "FOLATE", "FERRITIN", "TIBC", "IRON", "RETICCTPCT" in the last 72 hours.   PHYSICAL EXAM General: Well developed, well nourished, in no acute distress HEENT:  Normocephalic and atramatic Neck:  No JVD.  Lungs: Clear bilaterally to auscultation and percussion. Heart: HRRR . Normal S1 and S2 without gallops or murmurs.  Abdomen: Bowel sounds are positive, abdomen soft and non-tender  Msk:  Back normal, normal gait. Normal strength and tone for age. Extremities: No clubbing, cyanosis or edema.   Neuro: Alert and oriented X 3. Psych:  Good affect, responds appropriately  TELEMETRY: sinus rhythm, HR 89 bpm  ASSESSMENT AND PLAN: Patient resting comfortably in bed. No acute complaints. Patient scheduled for TEE for Monday, 08/17/22 at 8:30 am. Continue with plan.   Principal Problem:   Acute CVA (cerebrovascular accident) [facility]) Active Problems:   Dyslipidemia   GERD without esophagitis  Migraine   CVA (cerebral vascular accident) ([place])    Edmund Holcomb, FNP-C 08/15/2022 11:03 AM

## 2022-08-15 NOTE — Progress Notes (Signed)
Brief note No change reported this morning by the patient. Exam remains unchanged. Plan unchanged. Will follow.  -- Amie Portland, MD Neurologist [facility] Pager: [phone]

## 2022-08-15 NOTE — Consult Note (Signed)
[place] NOTE  Patient Care Team: Adin Hector, MD as PCP - General (Internal Medicine) Rockwell Germany, RN as Oncology Nurse Navigator Mauro Kaufmann, RN as Oncology Nurse Navigator Rolm Bookbinder, MD as Consulting Physician (General Surgery) Eppie Gibson, MD as Attending Physician (Radiation Oncology) Rubie Maid, MD as Consulting Physician (Obstetrics and Gynecology) Clent Jacks, RN as Oncology Nurse Navigator Sindy Guadeloupe, MD as Consulting Physician (Oncology)  CHIEF COMPLAINTS/PURPOSE OF CONSULTATION: Stroke history and history of uterine cancer  HISTORY OF PRESENTING ILLNESS:   Elizabeth Friedman 81 y.o.  female with a history of stage I carcinosarcoma of the uterus and also history of recent cryptogenic stroke discharged on dual antiplatelet therapy is here currently in the hospital for symptoms of dizziness upper and lower extremity paresthesias.  Repeat MRI brain showed multifocal acute ischemic infarcts in the anterior and posterior circulation consistent with central embolic stroke.  Given the concerns for embolic stroke from hypercoagulable state-oncology is consulted.  Of note patient finished chemotherapy for stage I carcinosarcoma approximately 3 months ago.  She also finished adjuvant radiation approximately month ago.     Review of Systems  Constitutional:  Negative for chills, diaphoresis, fever, malaise/fatigue and weight loss.  HENT:  Negative for nosebleeds and sore throat.   Eyes:  Negative for double vision.  Respiratory:  Negative for cough, hemoptysis, sputum production, shortness of breath and wheezing.   Cardiovascular:  Negative for chest pain, palpitations, orthopnea and leg swelling.  Gastrointestinal:  Negative for abdominal pain, blood in stool, constipation, diarrhea, heartburn, melena, nausea and vomiting.  Genitourinary:  Negative for dysuria, frequency and urgency.  Musculoskeletal:  Negative for back pain  and joint pain.  Skin: Negative.  Negative for itching and rash.  Neurological:  Positive for dizziness. Negative for tingling, focal weakness, weakness and headaches.  Endo/Heme/Allergies:  Does not bruise/bleed easily.  Psychiatric/Behavioral:  Negative for depression. The patient is not nervous/anxious and does not have insomnia.     MEDICAL HISTORY:  Past Medical History:  Diagnosis Date   Allergic rhinitis    Anemia    Anginal pain (HCC)    Arthritis    Atypical chest pain    Breast cancer ([place])    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[place])    Ductal carcinoma in situ (DCIS) of right breast    Fibrocystic breast disease    GERD (gastroesophageal reflux disease)    Hearing aid worn    bilateral   History of hiatal hernia    Hyperlipidemia    Lung nodule    Menopause    Migraine    Migraine    optical migraines   Motion sickness    Plantar fasciitis    Prolapse of female pelvic organs    hodge pessary   Raynaud's disease    Sciatic leg pain    Sciatica    Skin cancer of face    Stroke [facility])    Urinary retention with incomplete bladder emptying    Uterine carcinosarcoma ([place]) 11/2021   Vitreous detachment of both eyes     SURGICAL HISTORY: Past Surgical History:  Procedure Laterality Date   APPENDECTOMY  1951   BREAST LUMPECTOMY WITH RADIOACTIVE SEED LOCALIZATION Right 05/29/2020   Procedure: RIGHT BREAST LUMPECTOMY WITH RADIOACTIVE SEED LOCALIZATION;  Surgeon: Rolm Bookbinder, MD;  Location: [place];  Service: General;  Laterality: Right;   CARDIAC CATHETERIZATION  2004   negative   CATARACT EXTRACTION W/PHACO Right 02/26/2021  Procedure: CATARACT EXTRACTION PHACO AND INTRAOCULAR LENS PLACEMENT (IOC) RIGHT 5.86 01:19.6 7.4%;  Surgeon: Leandrew Koyanagi, MD;  Location: [place];  Service: Ophthalmology;  Laterality: Right;   CATARACT EXTRACTION W/PHACO Left 09/03/2021   Procedure: CATARACT EXTRACTION PHACO AND INTRAOCULAR LENS PLACEMENT ([place])  LEFT;  Surgeon: Leandrew Koyanagi, MD;  Location: [place];  Service: Ophthalmology;  Laterality: Left;  9.61 01:15.9   COLONOSCOPY     2000, 2006, 2012   COLONOSCOPY WITH PROPOFOL N/A 03/27/2016   Procedure: COLONOSCOPY WITH PROPOFOL;  Surgeon: Manya Silvas, MD;  Location: [facility] ENDOSCOPY;  Service: Endoscopy;  Laterality: N/A;   COLONOSCOPY WITH PROPOFOL N/A 08/14/2020   Procedure: COLONOSCOPY WITH PROPOFOL;  Surgeon: Toledo, Benay Pike, MD;  Location: [facility] ENDOSCOPY;  Service: Gastroenterology;  Laterality: N/A;   CYSTOSCOPY N/A 12/24/2021   Procedure: CYSTOSCOPY;  Surgeon: Jaquita Folds, MD;  Location: [facility] ORS;  Service: Gynecology;  Laterality: N/A;   DILATATION & CURETTAGE/HYSTEROSCOPY WITH MYOSURE N/A 12/01/2021   Procedure: DILATATION & CURETTAGE/HYSTEROSCOPY WITH MYOSURE;  Surgeon: Rubie Maid, MD;  Location: [facility] ORS;  Service: Gynecology;  Laterality: N/A;   ESOPHAGOGASTRODUODENOSCOPY  08/1999   ESOPHAGOGASTRODUODENOSCOPY (EGD) WITH PROPOFOL N/A 08/14/2020   Procedure: ESOPHAGOGASTRODUODENOSCOPY (EGD) WITH PROPOFOL;  Surgeon: Toledo, Benay Pike, MD;  Location: [facility] ENDOSCOPY;  Service: Gastroenterology;  Laterality: N/A;   ESOPHAGOGASTRODUODENOSCOPY (EGD) WITH PROPOFOL N/A 03/21/2021   Procedure: ESOPHAGOGASTRODUODENOSCOPY (EGD) WITH PROPOFOL;  Surgeon: Toledo, Benay Pike, MD;  Location: [facility] ENDOSCOPY;  Service: Gastroenterology;  Laterality: N/A;   PORTA CATH INSERTION N/A 01/15/2022   Procedure: PORTA CATH INSERTION;  Surgeon: Algernon Huxley, MD;  Location: [place] CV LAB;  Service: Cardiovascular;  Laterality: N/A;   PORTA CATH REMOVAL N/A 06/15/2022   Procedure: PORTA CATH REMOVAL;  Surgeon: Algernon Huxley, MD;  Location: [place] CV LAB;  Service: Cardiovascular;  Laterality: N/A;   SKIN CANCER EXCISION     face and neck   TOTAL LAPAROSCOPIC HYSTERECTOMY WITH BILATERAL SALPINGO OOPHORECTOMY Bilateral 12/24/2021   Procedure: TOTAL LAPAROSCOPIC  HYSTERECTOMY WITH BILATERAL SALPINGO OOPHORECTOMY, PELVIC NODE BIOPSY,  PESSARY REMOVAL (PESSARY REMOVAL BY DR. Wannetta Sender;  Surgeon: Mellody Drown, MD;  Location: [facility] ORS;  Service: Gynecology;  Laterality: Bilateral;    SOCIAL HISTORY: Social History   Socioeconomic History   Marital status: Widowed    Spouse name: Not on file   Number of children: Not on file   Years of education: Not on file   Highest education level: Not on file  Occupational History   Not on file  Tobacco Use   Smoking status: Never   Smokeless tobacco: Never  Vaping Use   Vaping Use: Never used  Substance and Sexual Activity   Alcohol use: No   Drug use: No   Sexual activity: Not Currently    Birth control/protection: Post-menopausal  Other Topics Concern   Not on file  Social History Narrative   Lives alone   Social Determinants of Health   Financial Resource Strain: Not on file  Food Insecurity: No Food Insecurity (08/14/2022)   Hunger Vital Sign    Worried About Running Out of Food in the Last Year: Never true    Ran Out of Food in the Last Year: Never true  Transportation Needs: No Transportation Needs (08/14/2022)   PRAPARE - Hydrologist (Medical): No    Lack of Transportation (Non-Medical): No  Physical Activity: Sufficiently Active (12/21/2017)   Exercise Vital Sign  Days of Exercise per Week: 3 days    Minutes of Exercise per Session: 60 min  Stress: Not on file  Social Connections: Not on file  Intimate Partner Violence: Not At Risk (08/14/2022)   Humiliation, Afraid, Rape, and Kick questionnaire    Fear of Current or Ex-Partner: No    Emotionally Abused: No    Physically Abused: No    Sexually Abused: No    FAMILY HISTORY: Family History  Problem Relation Age of Onset   Breast cancer Mother    Heart disease Father    Colon cancer Brother    Diabetes Neg Hx    Ovarian cancer Neg Hx     ALLERGIES:  has No Known Allergies.  MEDICATIONS:   Current Facility-Administered Medications  Medication Dose Route Frequency Provider Last Rate Last Admin   acetaminophen (TYLENOL) tablet 650 mg  650 mg Oral Q4H PRN Mansy, Jan A, MD   650 mg at 08/15/22 2123   Or   acetaminophen (TYLENOL) 160 MG/5ML solution 650 mg  650 mg Per Tube Q4H PRN Mansy, Jan A, MD       Or   acetaminophen (TYLENOL) suppository 650 mg  650 mg Rectal Q4H PRN Mansy, Jan A, MD       ascorbic acid (VITAMIN C) tablet 250 mg  250 mg Oral Daily Mansy, Jan A, MD   250 mg at 08/15/22 9024   aspirin chewable tablet 81 mg  81 mg Oral Daily Mansy, Jan A, MD   81 mg at 08/15/22 0949   atorvastatin (LIPITOR) tablet 40 mg  40 mg Oral Daily Mansy, Jan A, MD   40 mg at 08/15/22 0948   cholecalciferol (VITAMIN D3) tablet 5,000 Units  5,000 Units Oral Q breakfast Mansy, Jan A, MD   5,000 Units at 08/15/22 0948   clopidogrel (PLAVIX) tablet 75 mg  75 mg Oral Daily Mansy, Jan A, MD   75 mg at 08/15/22 0949   cyanocobalamin (VITAMIN B12) tablet 500 mcg  500 mcg Oral Daily Mansy, Jan A, MD   500 mcg at 08/15/22 0949   cycloSPORINE (RESTASIS) 0.05 % ophthalmic emulsion 1 drop  1 drop Both Eyes BID Mansy, Jan A, MD   1 drop at 08/15/22 2117   enoxaparin (LOVENOX) injection 40 mg  40 mg Subcutaneous Q24H Mansy, Jan A, MD   40 mg at 08/15/22 0973   Ferrous Fumarate (HEMOCYTE - 106 mg FE) tablet 106 mg of iron  1 tablet Oral Daily Mansy, Jan A, MD   106 mg of iron at 08/15/22 0949   gabapentin (NEURONTIN) capsule 400 mg  400 mg Oral BID Mansy, Jan A, MD   400 mg at 08/15/22 2118   hydrALAZINE (APRESOLINE) injection 10 mg  10 mg Intravenous Q4H PRN Regalado, Belkys A, MD       labetalol (NORMODYNE) injection 5 mg  5 mg Intravenous Q2H PRN Regalado, Belkys A, MD       ondansetron (ZOFRAN) injection 4 mg  4 mg Intravenous Q4H PRN Mansy, Jan A, MD       pantoprazole (PROTONIX) EC tablet 40 mg  40 mg Oral Q breakfast Mansy, Jan A, MD   40 mg at 08/15/22 0948   polyvinyl alcohol (LIQUIFILM TEARS) 1.4  % ophthalmic solution 1 drop  1 drop Both Eyes QHS Mansy, Jan A, MD   1 drop at 08/14/22 2157   senna-docusate (Senokot-S) tablet 1 tablet  1 tablet Oral QHS PRN Mansy, Arvella Merles, MD  topiramate (TOPAMAX) tablet 50 mg  50 mg Oral QPM Mansy, Jan A, MD   50 mg at 08/15/22 1739   traZODone (DESYREL) tablet 25 mg  25 mg Oral QHS PRN Mansy, Jan A, MD       triamcinolone (NASACORT) nasal inhaler 2 spray  2 spray Each Nare Daily PRN Mansy, Arvella Merles, MD       Vitamin E CAPS 400 Units  400 Units Oral Q breakfast Mansy, Jan A, MD   400 Units at 08/15/22 0950    PHYSICAL EXAMINATION:  Vitals:   08/15/22 1148 08/15/22 1642  BP: (!) 152/68 125/76  Pulse: 78 80  Resp: 16 16  Temp: 98 F (36.7 C) 97.7 F (36.5 C)  SpO2: 99% 98%   Filed Weights   08/14/22 1449  Weight: 111 lb 1.8 oz (50.4 kg)    Physical Exam Vitals and nursing note reviewed.  HENT:     Head: Normocephalic and atraumatic.     Mouth/Throat:     Pharynx: Oropharynx is clear.  Eyes:     Extraocular Movements: Extraocular movements intact.     Pupils: Pupils are equal, round, and reactive to light.  Cardiovascular:     Rate and Rhythm: Normal rate and regular rhythm.  Pulmonary:     Comments: Decreased breath sounds bilaterally.  Abdominal:     Palpations: Abdomen is soft.  Musculoskeletal:        General: Normal range of motion.     Cervical back: Normal range of motion.  Skin:    General: Skin is warm.  Neurological:     General: No focal deficit present.     Mental Status: She is alert and oriented to person, place, and time.  Psychiatric:        Behavior: Behavior normal.        Judgment: Judgment normal.     LABORATORY DATA:  I have reviewed the data as listed Lab Results  Component Value Date   WBC 4.6 08/13/2022   HGB 11.0 (L) 08/13/2022   HCT 32.5 (L) 08/13/2022   MCV 97.6 08/13/2022   PLT 125 (L) 08/13/2022   Recent Labs    08/05/22 1042 08/07/22 1458 08/13/22 1715  NA 130* 135 131*  K 4.3 4.0  4.1  CL 99 103 100  CO2 '25 24 23  '$ GLUCOSE 96 117* 91  BUN '14 15 18  '$ CREATININE 0.92 0.79 0.87  CALCIUM 8.9 9.5 9.2  GFRNONAA >60 >60 >60  PROT 7.0 7.2 6.9  ALBUMIN 4.2 4.3 4.1  AST '27 25 22  '$ ALT '20 18 17  '$ ALKPHOS 99 92 95  BILITOT 0.4 0.6 0.6    RADIOGRAPHIC STUDIES: I have personally reviewed the radiological images as listed and agreed with the findings in the report. MR BRAIN WO CONTRAST  Result Date: 08/14/2022 CLINICAL DATA:  Follow-up examination for stroke. EXAM: MRI HEAD WITHOUT CONTRAST TECHNIQUE: Multiplanar, multiecho pulse sequences of the brain and surrounding structures were obtained without intravenous contrast. COMPARISON:  Prior CT from earlier the same day. FINDINGS: Brain: Cerebral volume within normal limits. Scattered patchy T2/FLAIR hyperintensity involving the periventricular deep white matter of both cerebral hemispheres as well as the pons, consistent with chronic microvascular ischemic disease, mild for age. Few small remote lacunar infarcts noted at the left caudate head and ventral right thalamus. Patchy diffusion signal abnormality involving the cortical and subcortical aspect of the right frontoparietal region, with involvement of the pre and postcentral gyri, consistent with an acute  to early subacute right MCA distribution infarct. No associated hemorrhage. There is an additional patchy small volume acute to early subacute left cerebellar infarct. Minimal petechial blood products at this level without hemorrhagic transformation or significant mass effect. Few additional punctate foci of acute to early subacute ischemia noted within the left temporal occipital region (series 5, images 19, 16). Gray-white matter differentiation otherwise maintained. No other acute or chronic intracranial blood products. No mass lesion or midline shift. No hydrocephalus or extra-axial fluid collection. Partially empty sella noted. Vascular: Major intracranial vascular flow voids are  maintained. Skull and upper cervical spine: Craniocervical junction normal. Bone marrow signal intensity within normal limits. No scalp soft tissue abnormality. Sinuses/Orbits: Prior bilateral ocular lens replacement. Paranasal sinuses mastoid air cells are clear. Other: None. IMPRESSION: 1. Scattered acute to early subacute infarcts involving the posterior right frontal parietal region, left cerebellum, and left temporoccipital region. Minimal petechial blood products at the left cerebellum without hemorrhagic transformation or significant regional mass effect. Given the various vascular distributions involved, a central thromboembolic etiology is likely. 2. Underlying chronic microvascular ischemic disease with a few small remote lacunar infarcts as above. Electronically Signed   By: Jeannine Boga M.D.   On: 08/14/2022 00:21   CT HEAD WO CONTRAST (5MM)  Result Date: 08/13/2022 CLINICAL DATA:  Dizziness.  Recent stroke. EXAM: CT HEAD WITHOUT CONTRAST TECHNIQUE: Contiguous axial images were obtained from the base of the skull through the vertex without intravenous contrast. RADIATION DOSE REDUCTION: This exam was performed according to the departmental dose-optimization program which includes automated exposure control, adjustment of the mA and/or kV according to patient size and/or use of iterative reconstruction technique. COMPARISON:  08/08/2022 FINDINGS: Brain: No evidence of intracranial hemorrhage, acute infarction, hydrocephalus, extra-axial collection, or mass lesion/mass effect. Old lacunar infarcts are again seen involving the left basal ganglia and left cerebellum. Vascular:  No hyperdense vessel or other acute findings. Skull: No evidence of fracture or other significant bone abnormality. Sinuses/Orbits:  No acute findings. Other: None. IMPRESSION: No acute intracranial abnormality. Old lacunar infarcts in left basal ganglia and left cerebellum. Electronically Signed   By: Marlaine Hind M.D.    On: 08/13/2022 17:51   ECHOCARDIOGRAM COMPLETE BUBBLE STUDY  Result Date: 08/10/2022    ECHOCARDIOGRAM REPORT   Patient Name:   DAI APEL Date of Exam: 08/10/2022 Medical Rec #:  [id number]    Height:       62.0 in Accession #:    [id number]   Weight:       101.2 lb Date of Birth:  [birth year]   BSA:          1.431 m Patient Age:    51 years     BP:           134/77 mmHg Patient Gender: F            HR:           65 bpm. Exam Location:  [facility] Procedure: 2D Echo, Color Doppler, Cardiac Doppler and Saline Contrast Bubble            Study Indications:     Stroke 434.91 / I63.9  History:         Patient has prior history of Echocardiogram examinations, most                  recent 12/22/2021. Risk Factors:Dyslipidemia. Migraines.  Sonographer:     Sherrie Sport Referring Phys:  [place] Diagnosing Phys:  Serafina Royals MD IMPRESSIONS  1. Left ventricular ejection fraction, by estimation, is 60 to 65%. The left ventricle has normal function. The left ventricle has no regional wall motion abnormalities. Left ventricular diastolic parameters were normal.  2. Right ventricular systolic function is normal. The right ventricular size is normal.  3. The mitral valve is normal in structure. Mild mitral valve regurgitation.  4. The aortic valve is normal in structure. Aortic valve regurgitation is not visualized. FINDINGS  Left Ventricle: Left ventricular ejection fraction, by estimation, is 60 to 65%. The left ventricle has normal function. The left ventricle has no regional wall motion abnormalities. The left ventricular internal cavity size was normal in size. There is  no left ventricular hypertrophy. Left ventricular diastolic parameters were normal. Right Ventricle: The right ventricular size is normal. No increase in right ventricular wall thickness. Right ventricular systolic function is normal. Left Atrium: Left atrial size was normal in size. Right Atrium: Right atrial size was normal in size. Pericardium:  There is no evidence of pericardial effusion. Mitral Valve: The mitral valve is normal in structure. Mild mitral valve regurgitation. Tricuspid Valve: The tricuspid valve is normal in structure. Tricuspid valve regurgitation is mild. Aortic Valve: The aortic valve is normal in structure. Aortic valve regurgitation is not visualized. Aortic valve mean gradient measures 4.0 mmHg. Aortic valve peak gradient measures 7.8 mmHg. Aortic valve area, by VTI measures 1.98 cm. Pulmonic Valve: The pulmonic valve was normal in structure. Pulmonic valve regurgitation is trivial. Aorta: The aortic root and ascending aorta are structurally normal, with no evidence of dilitation. IAS/Shunts: No atrial level shunt detected by color flow Doppler. Agitated saline contrast was given intravenously to evaluate for intracardiac shunting.  LEFT VENTRICLE PLAX 2D LVIDd:         3.20 cm   Diastology LVIDs:         1.90 cm   LV e' medial:    5.66 cm/s LV PW:         0.60 cm   LV E/e' medial:  15.4 LV IVS:        1.00 cm   LV e' lateral:   8.92 cm/s LVOT diam:     1.90 cm   LV E/e' lateral: 9.8 LV SV:         58 LV SV Index:   40 LVOT Area:     2.84 cm  RIGHT VENTRICLE RV Basal diam:  3.80 cm RV Mid diam:    3.50 cm RV S prime:     19.50 cm/s TAPSE (M-mode): 2.8 cm LEFT ATRIUM           Index        RIGHT ATRIUM           Index LA diam:      2.40 cm 1.68 cm/m   RA Area:     10.20 cm LA Vol (A2C): 33.6 ml 23.48 ml/m  RA Volume:   20.70 ml  14.46 ml/m LA Vol (A4C): 11.4 ml 7.97 ml/m  AORTIC VALVE AV Area (Vmax):    2.01 cm AV Area (Vmean):   2.02 cm AV Area (VTI):     1.98 cm AV Vmax:           139.50 cm/s AV Vmean:          91.400 cm/s AV VTI:            0.291 m AV Peak Grad:      7.8 mmHg AV Mean  Grad:      4.0 mmHg LVOT Vmax:         99.00 cm/s LVOT Vmean:        65.000 cm/s LVOT VTI:          0.203 m LVOT/AV VTI ratio: 0.70  AORTA Ao Root diam: 3.20 cm MITRAL VALVE               TRICUSPID VALVE MV Area (PHT): 3.08 cm    TR Peak  grad:   17.1 mmHg MV Decel Time: 246 msec    TR Vmax:        207.00 cm/s MV E velocity: 87.00 cm/s MV A velocity: 89.60 cm/s  SHUNTS MV E/A ratio:  0.97        Systemic VTI:  0.20 m                            Systemic Diam: 1.90 cm Serafina Royals MD Electronically signed by Serafina Royals MD Signature Date/Time: 08/10/2022/9:02:45 AM    Final    CT ANGIO HEAD NECK W WO CM  Result Date: 08/08/2022 CLINICAL DATA:  Acute neurologic deficit EXAM: CT ANGIOGRAPHY HEAD AND NECK TECHNIQUE: Multidetector CT imaging of the head and neck was performed using the standard protocol during bolus administration of intravenous contrast. Multiplanar CT image reconstructions and MIPs were obtained to evaluate the vascular anatomy. Carotid stenosis measurements (when applicable) are obtained utilizing NASCET criteria, using the distal internal carotid diameter as the denominator. RADIATION DOSE REDUCTION: This exam was performed according to the departmental dose-optimization program which includes automated exposure control, adjustment of the mA and/or kV according to patient size and/or use of iterative reconstruction technique. CONTRAST:  68m OMNIPAQUE IOHEXOL 350 MG/ML SOLN COMPARISON:  08/07/2022 FINDINGS: CT HEAD FINDINGS Brain: There is no mass, hemorrhage or extra-axial collection. The size and configuration of the ventricles and extra-axial CSF spaces are normal. 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 There is hypoattenuation of the periventricular white matter, most commonly indicating chronic ischemic microangiopathy. Skull: The visualized skull base, calvarium and extracranial soft tissues are normal. Sinuses/Orbits: No fluid levels or advanced mucosal thickening of the visualized paranasal sinuses. No mastoid or middle ear effusion. The orbits are normal. CTA NECK FINDINGS SKELETON: There is no bony spinal canal stenosis. No lytic or blastic lesion. OTHER NECK: Normal pharynx, larynx and major salivary  glands. No cervical lymphadenopathy. Unremarkable thyroid gland. UPPER CHEST: Biapical scarring AORTIC ARCH: There is calcific atherosclerosis of the aortic arch. There is no aneurysm, dissection or hemodynamically significant stenosis of the visualized portion of the aorta. Conventional 3 vessel aortic branching pattern. The visualized proximal subclavian arteries are widely patent. RIGHT CAROTID SYSTEM: No dissection, occlusion or aneurysm. Mild atherosclerotic calcification at the carotid bifurcation without hemodynamically significant stenosis. LEFT CAROTID SYSTEM: No dissection, occlusion or aneurysm. Mild atherosclerotic calcification at the carotid bifurcation without hemodynamically significant stenosis. VERTEBRAL ARTERIES: Left dominant configuration. Both origins are clearly patent. There is no dissection, occlusion or flow-limiting stenosis to the skull base (V1-V3 segments). CTA HEAD FINDINGS POSTERIOR CIRCULATION: --Vertebral arteries: Normal V4 segments. --Inferior cerebellar arteries: Normal. --Basilar artery: Moderate stenosis of the distal basilar artery. --Superior cerebellar arteries: Normal. --Posterior cerebral arteries (PCA): Short segment severe stenosis/occlusion of the left P1 segment with reconstitution. Severe stenosis or short segment occlusion of the proximal left P3 segment. Right PCA is normal. There is a fetal predominant origin of the right PCA and  a diminutive left P-comm. ANTERIOR CIRCULATION: --Intracranial internal carotid arteries: Normal. --Anterior cerebral arteries (ACA): Normal. Both A1 segments are present. Patent anterior communicating artery (a-comm). --Middle cerebral arteries (MCA): Normal. VENOUS SINUSES: As permitted by contrast timing, patent. ANATOMIC VARIANTS: Fetal origin of the right posterior cerebral artery. Review of the MIP images confirms the above findings. IMPRESSION: 1. Short segment severe stenosis/occlusion of the left P1 segment with reconstitution. 2.  Severe stenosis or short segment occlusion of the proximal left P3 segment. 3. Moderate stenosis of the distal basilar artery. 4. 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 5. Mild bilateral carotid bifurcation atherosclerosis without hemodynamically significant stenosis. Electronically Signed   By: Ulyses Jarred M.D.   On: 08/08/2022 19:02   US Venous Img Lower Bilateral (DVT)  Result Date: 08/08/2022 CLINICAL DATA:  Embolic stroke. Hypercoagulable. Clinical concern for DVT. EXAM: BILATERAL LOWER EXTREMITY VENOUS DOPPLER ULTRASOUND TECHNIQUE: Gray-scale sonography with compression, as well as color and duplex ultrasound, were performed to evaluate the deep venous system(s) from the level of the common femoral vein through the popliteal and proximal calf veins. COMPARISON:  None Available. FINDINGS: VENOUS Normal compressibility of the common femoral, superficial femoral, and popliteal veins, as well as the visualized calf veins. Visualized portions of profunda femoral vein and great saphenous vein unremarkable. No filling defects to suggest DVT on grayscale or color Doppler imaging. Doppler waveforms show normal direction of venous flow, normal respiratory plasticity and response to augmentation. Limited views of the contralateral common femoral vein are unremarkable. OTHER None. Limitations: Left peroneal vein not visualized. IMPRESSION: No evidence of a right or left lower extremity deep venous thrombosis. Electronically Signed   By: Lajean Manes M.D.   On: 08/08/2022 18:17   NM Pulmonary Perfusion  Result Date: 08/08/2022 CLINICAL DATA:  Pulmonary embolism suspected. EXAM: NUCLEAR MEDICINE PERFUSION LUNG SCAN TECHNIQUE: Perfusion images were obtained in multiple projections after intravenous injection of radiopharmaceutical. Ventilation scans intentionally deferred if perfusion scan and chest x-ray adequate for interpretation during COVID 19 epidemic. RADIOPHARMACEUTICALS:  4.1 mCi Tc-24mMAA  IV COMPARISON:  Chest x-ray earlier same day FINDINGS: Heterogeneous lung perfusion identified in the upper lobes bilaterally and in both lung bases. No definite peripheral wedge-shaped filling defect in either lung. IMPRESSION: Lung perfusion scan indeterminate for acute pulmonary embolus. Heterogeneous perfusion may reflect underlying emphysema. Consider chest CTA to further evaluate if patient renal function permits. Electronically Signed   By: EMisty StanleyM.D.   On: 08/08/2022 10:40   DG Chest Port 1 View  Result Date: 08/08/2022 CLINICAL DATA:  Dyspnea EXAM: PORTABLE CHEST 1 VIEW COMPARISON:  CT 08/07/2022 FINDINGS: The heart size and mediastinal contours are within normal limits. Reticulonodular opacities within the periphery of the right lower lobe, similar to the recent previous CT. No new airspace consolidation. No pleural effusion or pneumothorax. Calcified granuloma at the left lung base. The visualized skeletal structures are unremarkable. IMPRESSION: Unchanged reticulonodular opacities within the periphery of the right lower lobe. No new airspace consolidation. Electronically Signed   By: NDavina PokeD.O.   On: 08/08/2022 09:13   MR Brain W and Wo Contrast  Result Date: 08/07/2022 CLINICAL DATA:  Dizziness EXAM: MRI HEAD WITHOUT AND WITH CONTRAST TECHNIQUE: Multiplanar, multiecho pulse sequences of the brain and surrounding structures were obtained without and with intravenous contrast. CONTRAST:  438mGADAVIST GADOBUTROL 1 MMOL/ML IV SOLN COMPARISON:  08/25/2019 FINDINGS: Brain: Scattered punctate foci of acute ischemia within both cerebellar hemispheres, left basal ganglia and  both frontal lobes. Pattern is most consistent with emboli. No acute or chronic hemorrhage. There is multifocal hyperintense T2-weighted signal within the white matter. Parenchymal volume and CSF spaces are normal. The midline structures are normal. There is no abnormal contrast enhancement. Vascular: Major flow  voids are preserved. Skull and upper cervical spine: Normal calvarium and skull base. Visualized upper cervical spine and soft tissues are normal. Sinuses/Orbits:No paranasal sinus fluid levels or advanced mucosal thickening. No mastoid or middle ear effusion. Normal orbits. IMPRESSION: Scattered punctate foci of acute ischemia within both cerebellar hemispheres, left basal ganglia and both frontal lobes. Pattern is most consistent with emboli, most likely from a cardiac or proximal aortic source. No hemorrhage or mass effect. Electronically Signed   By: Ulyses Jarred M.D.   On: 08/07/2022 21:22   CT HEAD WO CONTRAST  Result Date: 08/07/2022 CLINICAL DATA:  Dizziness on Monday followed by vision changes on Wednesday EXAM: CT HEAD WITHOUT CONTRAST TECHNIQUE: Contiguous axial images were obtained from the base of the skull through the vertex without intravenous contrast. RADIATION DOSE REDUCTION: This exam was performed according to the departmental dose-optimization program which includes automated exposure control, adjustment of the mA and/or kV according to patient size and/or use of iterative reconstruction technique. COMPARISON:  Brain MRI 08/25/2019 FINDINGS: Brain: There is no acute intracranial hemorrhage, extra-axial fluid collection, or acute territorial infarct. Parenchymal volume is normal for age. The ventricles are normal in size. Gray-white differentiation is preserved. There is a small remote lacunar infarct in the left caudate head, new since the prior MRI from 2020. There is no mass lesion.  There is no mass effect or midline shift. Vascular: No hyperdense vessel or unexpected calcification. Skull: Normal. Negative for fracture or focal lesion. Sinuses/Orbits: The imaged paranasal sinuses are clear. Bilateral lens implants are in place. The globes and orbits are otherwise unremarkable. Other: None. IMPRESSION: 1. No acute intracranial pathology. 2. Small infarct in the left caudate head is new  since the brain MRI from 2020 but is remote in appearance. Otherwise, unremarkable for age head CT. Electronically Signed   By: Valetta Mole M.D.   On: 08/07/2022 16:52   CT CHEST ABDOMEN PELVIS W CONTRAST  Result Date: 08/07/2022 CLINICAL DATA:  History of endometrial cancer, rising CA 125. * Tracking Code: BO * EXAM: CT CHEST, ABDOMEN, AND PELVIS WITH CONTRAST TECHNIQUE: Multidetector CT imaging of the chest, abdomen and pelvis was performed following the standard protocol during bolus administration of intravenous contrast. RADIATION DOSE REDUCTION: This exam was performed according to the departmental dose-optimization program which includes automated exposure control, adjustment of the mA and/or kV according to patient size and/or use of iterative reconstruction technique. CONTRAST:  17m OMNIPAQUE IOHEXOL 300 MG/ML  SOLN COMPARISON:  CT December 11, 2021. FINDINGS: CT CHEST FINDINGS Cardiovascular: Aortic atherosclerosis. Normal caliber thoracic aorta. No central pulmonary embolus on this nondedicated study. Coronary artery calcifications. Normal size heart. No significant pericardial effusion/thickening. Mediastinum/Nodes: No supraclavicular adenopathy. No discrete thyroid nodule. No pathologically enlarged mediastinal, hilar or axillary lymph nodes. Unchanged calcified nodal tissue in the subcarinal space. The esophagus is grossly unremarkable. Lungs/Pleura: Biapical pleuroparenchymal scarring. Bronchiectasis with peripheral airway impaction noted in the right lower lobe on image 93/5 with adjacent tree-in-bud nodules. Additional tree-in-bud nodularity in the right middle lobe, lingula and scattered throughout the right lower lobe. Similar ground-glass opacities in the left lower lobe for instance on image 111/5 measuring 7 mm and 95/5 measuring 5 mm, unchanged. No new suspicious pulmonary nodules  or masses. No pleural effusion. No pneumothorax. Musculoskeletal: Biopsy clip in the right breast. No  aggressive lytic or blastic lesion of bone. Multilevel degenerative changes spine. Diffuse demineralization of bone. CT ABDOMEN PELVIS FINDINGS Hepatobiliary: No suspicious hepatic lesion. Gallbladder is unremarkable. Common bile duct measures 5 mm, unchanged Pancreas: No pancreatic ductal dilation or evidence of acute inflammation. Spleen: Calcified hepatic granulomata. Adrenals/Urinary Tract: Bilateral adrenal glands appear normal. No hydronephrosis. Kidneys demonstrate symmetric enhancement and excretion of contrast material. Urinary bladder is unremarkable for degree of distension Stomach/Bowel: Radiopaque enteric contrast material traverses the hepatic flexure. Stomach is unremarkable for degree of distension. No pathologic dilation of small or large bowel. Gas fluid levels throughout the colon suggestive of diarrheal illness. Vascular/Lymphatic: Aortic atherosclerosis. No pathologically enlarged abdominal or pelvic lymph nodes. Reproductive: Uterus is surgically absent, no soft tissue nodularity along the vaginal cuff. No suspicious adnexal mass Other: Mild mesenteric edema with some heterogeneity of the omental fat, similar prior. Trace fluid in the pelvis for instance a focal area of fluid in the posterior left hemipelvis on image 111/2. Mild pelvic peritoneal thickening. No discrete peritoneal or omental nodularity. Musculoskeletal: No aggressive lytic or blastic lesion of bone. Degenerative grade 1 L4 on L5 anterolisthesis. Multilevel degenerative changes spine. Diffuse demineralization of bone. IMPRESSION: 1. Status post hysterectomy without soft tissue nodularity along the vaginal cuff. 2. Trace pelvic free fluid and pelvic peritoneal thickening with similar mild mesenteric edema and heterogeneity of the omental fat. No discrete peritoneal or omental nodularity. 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 Consider attention on short-term interval follow-up imaging. 3.  Bronchiectasis with peripheral airway impaction in the right lower lobe and adjacent tree-in-bud nodules as well as additional tree-in-bud nodularity in the right middle lobe, lingula and scattered throughout the right lower lobe. Findings are favored to reflect an atypical infectious or inflammatory process such as atypical mycobacterial infection. 4. Stable ground-glass opacities in the left lower lobe measuring up to 7 mm, nonspecific possibly reflecting an infectious or inflammatory process. Attention on follow-up imaging suggested. 5. Gas fluid levels throughout the colon as can be seen with diarrheal illness 6.  Aortic Atherosclerosis (ICD10-I70.0). Electronically Signed   By: Dahlia Bailiff M.D.   On: 08/07/2022 10:33     81 year old woman with a history of uterine cancer presents to the hospital for recurrent acute stroke suggestive of embolic etiology.  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  CT scan abdomen pelvis 2023-mild peritoneal thickening noted however no convincing evidence of recurrent disease.  CA 125 slightly elevated.  #Embolic stroke recurrent-the etiology is unclear cardiac versus hypercoagulable state-primary vs. Secondary [history of malignancy, recent chemotherapy age].  Recommendations/plan:  #With regards to etiology of embolic stroke-given the absence of convincing evidence of secondary hypercoagulable state from underlying malignancy, I would recommend further evaluation of cardiac cause of embolic stroke.  To complete the work-up I would recommend-prothrombin gene mutation factor V Leiden Antithrombin III protein CNS and also antiphospholipid antibody syndrome.  #Given the slightly elevated Ca1 2 5 I would recommend follow-up serial Ca1 2 5 for the next 2 to 3 months outpatient.  Patient is also scheduled for outpatient repeat imaging in the next 3 months also.  Also currently awaiting evaluation with gynecology  oncology in January.  Thank you Dr. Tyrell Antonio for allowing me to participate in the care of your pleasant patient. Please do not hesitate to contact me with questions or concerns in the interim. The above plan  of care was discussed with patient; and her son in detail.  Also discussed with primary team and neurology.  Above plan of care was discussed with patient/family in detail.  My contact information was given to the patient/family.     Cammie Sickle, MD 08/15/2022 9:55 PM

## 2022-08-15 NOTE — Progress Notes (Signed)
PROGRESS NOTE    Elizabeth Friedman  XNA:[id number] DOB: 1941/03/16 DOA: 08/13/2022 PCP: Adin Hector, MD   Brief Narrative: 81 year old with past medical history significant for recurrent CVA, dyslipidemia, GERD, osteoarthritis, vitamin B12 deficiency, breast and uterine cancer who was recently admitted for stroke on 10/13 who presents now with dizziness, right thigh paresthesia and tingling, she wake up the morning of admission with the symptoms.  She has been taking aspirin and Plavix.  MRI confirmed early subacute infarct involving the posterior right frontal parietal region, left cerebellum and left temporal occipital region.  Minimal petechial blood products at the level of the cerebellum without hemorrhagic transformation or significant regional mass effect.    Assessment & Plan:   Principal Problem:   Acute CVA (cerebrovascular accident) [facility]) Active Problems:   Dyslipidemia   GERD without esophagitis   Migraine   CVA (cerebral vascular accident) ([place])  1-Acute CVA: -Patient presented with dizziness and right-sided paresthesia. -MRI: Scattered acute to early subacute infarct involving the posterior right frontoparietal region, left cerebellum and left temporal occipital region. -Currently on aspirin and Plavix.  Follow neurology recommendation. -Cardiology consulted for TEE>  Oncology consulted in regards input on hypercoagulability.  If she needs anticoagulation from a hypercoagulable with standpoint, then plan is not to proceed with TEE.  Device will need to be interrogated.  Nurse to contact representative. Remissive hypertension systolic blood pressure goal today  160/ PRN dressing and labetalol order Plan for TEE on Monday. Oncology order hypercoagulable panel, recommend TEE no clear evidence she needs anticoagulation from malignancy perspective.    Dyslipidemia: Continue with Lipitor  GERD: Continue with PPI  Migraine: On Topamax   History of  urine and breast  cancer: Oncology is consulted to provide input in regards hypercoagulability in the setting of recurrent stroke    Estimated body mass index is 20.32 kg/m as calculated from the following:   Height as of this encounter: '5\' 2"'$  (1.575 m).   Weight as of this encounter: 50.4 kg.   DVT prophylaxis: Lovenox Code Status: DNR Family Communication: Patient.  Disposition Plan:  Status is: Observation The patient will require care spanning > 2 midnights and should be moved to inpatient because: Recurrent stroke    Consultants:  Neurology Oncology  Procedures:    Antimicrobials:    Subjective: She is feeling well, stable, symptoms stable, no worse.   Objective: Vitals:   08/15/22 0007 08/15/22 0500 08/15/22 0751 08/15/22 1148  BP: (!) 144/78 137/73 (!) 150/87 (!) 152/68  Pulse: 60 64 70 78  Resp: '16 16 16 16  '$ Temp: 97.8 F (36.6 C) 97.9 F (36.6 C) 97.8 F (36.6 C) 98 F (36.7 C)  TempSrc: Oral Oral    SpO2: 98% 98% 100% 99%  Weight:      Height:        Intake/Output Summary (Last 24 hours) at 08/15/2022 1345 Last data filed at 08/15/2022 0511 Gross per 24 hour  Intake 1579.26 ml  Output --  Net 1579.26 ml   Filed Weights   08/14/22 1449  Weight: 50.4 kg    Examination:  General exam: NAD Respiratory system: CTA Cardiovascular system: S 1, S 2  RRR Gastrointestinal system: BS present, soft, nt Central nervous system: Alert.  Extremities: no edema   Data Reviewed: I have personally reviewed following labs and imaging studies  CBC: Recent Labs  Lab 08/13/22 1715  WBC 4.6  NEUTROABS 2.8  HGB 11.0*  HCT 32.5*  MCV 97.6  PLT  125*    Basic Metabolic Panel: Recent Labs  Lab 08/13/22 1715  NA 131*  K 4.1  CL 100  CO2 23  GLUCOSE 91  BUN 18  CREATININE 0.87  CALCIUM 9.2    GFR: Estimated Creatinine Clearance: 39.4 mL/min (by C-G formula based on SCr of 0.87 mg/dL). Liver Function Tests: Recent Labs  Lab 08/13/22 1715  AST 22  ALT  17  ALKPHOS 95  BILITOT 0.6  PROT 6.9  ALBUMIN 4.1    No results for input(s): "LIPASE", "AMYLASE" in the last 168 hours. No results for input(s): "AMMONIA" in the last 168 hours. Coagulation Profile: Recent Labs  Lab 08/15/22 0909  INR 1.2    Cardiac Enzymes: No results for input(s): "CKTOTAL", "CKMB", "CKMBINDEX", "TROPONINI" in the last 168 hours. BNP (last 3 results) No results for input(s): "PROBNP" in the last 8760 hours. HbA1C: No results for input(s): "HGBA1C" in the last 72 hours. CBG: No results for input(s): "GLUCAP" in the last 168 hours. Lipid Profile: Recent Labs    08/14/22 0500  CHOL 152  HDL 48  LDLCALC 86  TRIG 91  CHOLHDL 3.2    Thyroid Function Tests: No results for input(s): "TSH", "T4TOTAL", "FREET4", "T3FREE", "THYROIDAB" in the last 72 hours. Anemia Panel: No results for input(s): "VITAMINB12", "FOLATE", "FERRITIN", "TIBC", "IRON", "RETICCTPCT" in the last 72 hours. Sepsis Labs: No results for input(s): "PROCALCITON", "LATICACIDVEN" in the last 168 hours.  Recent Results (from the past 240 hour(s))  Resp Panel by RT-PCR (Flu A&B, Covid) Anterior Nasal Swab     Status: None   Collection Time: 08/07/22  2:58 PM   Specimen: Anterior Nasal Swab  Result Value Ref Range Status   SARS Coronavirus 2 by RT PCR NEGATIVE NEGATIVE Final    Comment: (NOTE) SARS-CoV-2 target nucleic acids are NOT DETECTED.  The SARS-CoV-2 RNA is generally detectable in upper respiratory specimens during the acute phase of infection. The lowest concentration of SARS-CoV-2 viral copies this assay can detect is 138 copies/mL. A negative result does not preclude SARS-Cov-2 infection and should not be used as the sole basis for treatment or other patient management decisions. A negative result may occur with  improper specimen collection/handling, submission of specimen other than nasopharyngeal swab, presence of viral mutation(s) within the areas targeted by this  assay, and inadequate number of viral copies(<138 copies/mL). A negative result must be combined with clinical observations, patient history, and epidemiological information. The expected result is Negative.  Fact Sheet for Patients:  EntrepreneurPulse.com.au  Fact Sheet for Healthcare Providers:  IncredibleEmployment.be  This test is no t yet approved or cleared by the [place] FDA and  has been authorized for detection and/or diagnosis of SARS-CoV-2 by FDA under an Emergency Use Authorization (EUA). This EUA will remain  in effect (meaning this test can be used) for the duration of the COVID-19 declaration under Section 564(b)(1) of the Act, 21 U.S.C.section 360bbb-3(b)(1), unless the authorization is terminated  or revoked sooner.       Influenza A by PCR NEGATIVE NEGATIVE Final   Influenza B by PCR NEGATIVE NEGATIVE Final    Comment: (NOTE) The Xpert Xpress SARS-CoV-2/FLU/RSV plus assay is intended as an aid in the diagnosis of influenza from Nasopharyngeal swab specimens and should not be used as a sole basis for treatment. Nasal washings and aspirates are unacceptable for Xpert Xpress SARS-CoV-2/FLU/RSV testing.  Fact Sheet for Patients: EntrepreneurPulse.com.au  Fact Sheet for Healthcare Providers: IncredibleEmployment.be  This test is not yet approved  or cleared by the [place] and has been authorized for detection and/or diagnosis of SARS-CoV-2 by FDA under an Emergency Use Authorization (EUA). This EUA will remain in effect (meaning this test can be used) for the duration of the COVID-19 declaration under Section 564(b)(1) of the Act, 21 U.S.C. section 360bbb-3(b)(1), unless the authorization is terminated or revoked.  Performed at [facility], [address], [place], [place] [address]          Radiology Studies: MR BRAIN WO CONTRAST  Result Date:  08/14/2022 CLINICAL DATA:  Follow-up examination for stroke. EXAM: MRI HEAD WITHOUT CONTRAST TECHNIQUE: Multiplanar, multiecho pulse sequences of the brain and surrounding structures were obtained without intravenous contrast. COMPARISON:  Prior CT from earlier the same day. FINDINGS: Brain: Cerebral volume within normal limits. Scattered patchy T2/FLAIR hyperintensity involving the periventricular deep white matter of both cerebral hemispheres as well as the pons, consistent with chronic microvascular ischemic disease, mild for age. Few small remote lacunar infarcts noted at the left caudate head and ventral right thalamus. Patchy diffusion signal abnormality involving the cortical and subcortical aspect of the right frontoparietal region, with involvement of the pre and postcentral gyri, consistent with an acute to early subacute right MCA distribution infarct. No associated hemorrhage. There is an additional patchy small volume acute to early subacute left cerebellar infarct. Minimal petechial blood products at this level without hemorrhagic transformation or significant mass effect. Few additional punctate foci of acute to early subacute ischemia noted within the left temporal occipital region (series 5, images 19, 16). Gray-white matter differentiation otherwise maintained. No other acute or chronic intracranial blood products. No mass lesion or midline shift. No hydrocephalus or extra-axial fluid collection. Partially empty sella noted. Vascular: Major intracranial vascular flow voids are maintained. Skull and upper cervical spine: Craniocervical junction normal. Bone marrow signal intensity within normal limits. No scalp soft tissue abnormality. Sinuses/Orbits: Prior bilateral ocular lens replacement. Paranasal sinuses mastoid air cells are clear. Other: None. IMPRESSION: 1. Scattered acute to early subacute infarcts involving the posterior right frontal parietal region, left cerebellum, and left  temporoccipital region. Minimal petechial blood products at the left cerebellum without hemorrhagic transformation or significant regional mass effect. Given the various vascular distributions involved, a central thromboembolic etiology is likely. 2. Underlying chronic microvascular ischemic disease with a few small remote lacunar infarcts as above. Electronically Signed   By: Jeannine Boga M.D.   On: 08/14/2022 00:21   CT HEAD WO CONTRAST (5MM)  Result Date: 08/13/2022 CLINICAL DATA:  Dizziness.  Recent stroke. EXAM: CT HEAD WITHOUT CONTRAST TECHNIQUE: Contiguous axial images were obtained from the base of the skull through the vertex without intravenous contrast. RADIATION DOSE REDUCTION: This exam was performed according to the departmental dose-optimization program which includes automated exposure control, adjustment of the mA and/or kV according to patient size and/or use of iterative reconstruction technique. COMPARISON:  08/08/2022 FINDINGS: Brain: No evidence of intracranial hemorrhage, acute infarction, hydrocephalus, extra-axial collection, or mass lesion/mass effect. Old lacunar infarcts are again seen involving the left basal ganglia and left cerebellum. Vascular:  No hyperdense vessel or other acute findings. Skull: No evidence of fracture or other significant bone abnormality. Sinuses/Orbits:  No acute findings. Other: None. IMPRESSION: No acute intracranial abnormality. Old lacunar infarcts in left basal ganglia and left cerebellum. Electronically Signed   By: Marlaine Hind M.D.   On: 08/13/2022 17:51        Scheduled Meds:  ascorbic acid  250 mg Oral Daily  aspirin  81 mg Oral Daily   atorvastatin  40 mg Oral Daily   cholecalciferol  5,000 Units Oral Q breakfast   clopidogrel  75 mg Oral Daily   vitamin B-12  500 mcg Oral Daily   cycloSPORINE  1 drop Both Eyes BID   enoxaparin (LOVENOX) injection  40 mg Subcutaneous Q24H   Ferrous Fumarate  1 tablet Oral Daily    gabapentin  400 mg Oral BID   pantoprazole  40 mg Oral Q breakfast   polyvinyl alcohol  1 drop Both Eyes QHS   topiramate  50 mg Oral QPM   vitamin E  400 Units Oral Q breakfast   Continuous Infusions:  sodium chloride 75 mL/hr at 08/15/22 1141     LOS: 1 day    Time spent: 35 Minutes.     Elmarie Shiley, MD [facility]   If 7PM-7AM, please contact night-coverage [email or web address]  08/15/2022, 1:45 PM

## 2022-08-16 ENCOUNTER — Inpatient Hospital Stay: Payer: PPO

## 2022-08-16 DIAGNOSIS — I639 Cerebral infarction, unspecified: Secondary | ICD-10-CM | POA: Diagnosis not present

## 2022-08-16 LAB — BASIC METABOLIC PANEL
Anion gap: 4 — ABNORMAL LOW (ref 5–15)
BUN: 14 mg/dL (ref 8–23)
CO2: 23 mmol/L (ref 22–32)
Calcium: 9.2 mg/dL (ref 8.9–10.3)
Chloride: 111 mmol/L (ref 98–111)
Creatinine, Ser: 0.88 mg/dL (ref 0.44–1.00)
GFR, Estimated: >60 mL/min (ref >60)
Glucose, Bld: 99 mg/dL (ref 70–99)
Potassium: 3.6 mmol/L (ref 3.5–5.1)
Sodium: 138 mmol/L (ref 135–145)

## 2022-08-16 LAB — GLUCOSE, CAPILLARY: Glucose-Capillary: 85 mg/dL (ref 70–99)

## 2022-08-16 LAB — PROTEIN S, TOTAL: Protein S Ag, Total: 83 % (ref 60–150)

## 2022-08-16 MED ORDER — SODIUM CHLORIDE 0.9 % IV SOLN: INTRAVENOUS | Status: DC

## 2022-08-16 NOTE — Plan of Care (Signed)
  Problem: Self-Care: Goal: Ability to participate in self-care as condition permits will improve Outcome: Progressing   Problem: Nutrition: Goal: Dietary intake will improve Outcome: Progressing   Problem: Clinical Measurements: Goal: Will remain free from infection Outcome: Progressing   Problem: Pain Managment: Goal: General experience of comfort will improve Outcome: Progressing

## 2022-08-16 NOTE — Progress Notes (Signed)
Neurology Progress Note   S:// Seen and examined. She has had worsening left hand dexterity since 0100. No code was called at that time. Dr. Tyrell Antonio called me at 0800 to notify of these symptoms as soon as she rounded on her. No need at this time to activate a code stroke.  O:// Current vital signs: BP (!) 152/75 (BP Location: Right Arm)   Pulse 63   Temp (!) 97.5 F (36.4 C) (Oral)   Resp 15   Ht '5\' 2"'$  (1.575 m)   Wt 50.4 kg   SpO2 100%   BMI 20.32 kg/m  Vital signs in last 24 hours: Temp:  [97.5 F (36.4 C)-99 F (37.2 C)] 97.5 F (36.4 C) (10/22 0743) Pulse Rate:  [59-80] 63 (10/22 0743) Resp:  [15-18] 15 (10/22 0743) BP: (124-154)/(68-83) 152/75 (10/22 0743) SpO2:  [96 %-100 %] 100 % (10/22 0743) Gen: Awake alert in no distress HEENT: Normocephalic atraumatic Lungs: Clear Cardiovascular: Regular rate rhythm Abdomen nondistended nontender Extremities warm well perfused without any evidence of edema Neurological exam She is awake alert oriented x3 There is very subtle to dysarthria which is better than before. No evidence of aphasia-able to provide clear history. Cranial nerves II to XII intact--with the exception of very subtle left NLF flattening. Motor examination did not reveal drift in any of the 4 extremities.  Left hand is 3/5 which is worse than yesterday. Wrist flexion/ext on left is also 3/5. Shoulder and elbow flexion is 4/5 - which also is worse than yesterday. Right UE Right LE and Left  she is full strength. Sensation is intact to light touch without extinction but she reports feeling tingly in her right leg and right arm. Coordination exam-no dysmetria Gait testing was deferred at this time NIH stroke scale-1 for sensory at best.  Medications  Current Facility-Administered Medications:    0.9 %  sodium chloride infusion, , Intravenous, Continuous, Regalado, Belkys A, MD   acetaminophen (TYLENOL) tablet 650 mg, 650 mg, Oral, Q4H PRN, 650 mg at  08/15/22 2123 **OR** acetaminophen (TYLENOL) 160 MG/5ML solution 650 mg, 650 mg, Per Tube, Q4H PRN **OR** acetaminophen (TYLENOL) suppository 650 mg, 650 mg, Rectal, Q4H PRN, Mansy, Jan A, MD   ascorbic acid (VITAMIN C) tablet 250 mg, 250 mg, Oral, Daily, Mansy, Jan A, MD, 250 mg at 08/15/22 1950   aspirin chewable tablet 81 mg, 81 mg, Oral, Daily, Mansy, Jan A, MD, 81 mg at 08/15/22 0949   atorvastatin (LIPITOR) tablet 40 mg, 40 mg, Oral, Daily, Mansy, Jan A, MD, 40 mg at 08/15/22 9326   cholecalciferol (VITAMIN D3) tablet 5,000 Units, 5,000 Units, Oral, Q breakfast, Mansy, Jan A, MD, 5,000 Units at 08/15/22 7124   clopidogrel (PLAVIX) tablet 75 mg, 75 mg, Oral, Daily, Mansy, Jan A, MD, 75 mg at 08/15/22 5809   cyanocobalamin (VITAMIN B12) tablet 500 mcg, 500 mcg, Oral, Daily, Mansy, Jan A, MD, 500 mcg at 08/15/22 0949   cycloSPORINE (RESTASIS) 0.05 % ophthalmic emulsion 1 drop, 1 drop, Both Eyes, BID, Mansy, Jan A, MD, 1 drop at 08/15/22 2117   enoxaparin (LOVENOX) injection 40 mg, 40 mg, Subcutaneous, Q24H, Mansy, Jan A, MD, 40 mg at 08/15/22 9833   Ferrous Fumarate (HEMOCYTE - 106 mg FE) tablet 106 mg of iron, 1 tablet, Oral, Daily, Mansy, Jan A, MD, 106 mg of iron at 08/15/22 0949   gabapentin (NEURONTIN) capsule 400 mg, 400 mg, Oral, BID, Mansy, Jan A, MD, 400 mg at 08/15/22 2118   hydrALAZINE (  APRESOLINE) injection 10 mg, 10 mg, Intravenous, Q4H PRN, Regalado, Belkys A, MD   labetalol (NORMODYNE) injection 5 mg, 5 mg, Intravenous, Q2H PRN, Regalado, Belkys A, MD   ondansetron (ZOFRAN) injection 4 mg, 4 mg, Intravenous, Q4H PRN, Mansy, Jan A, MD   pantoprazole (PROTONIX) EC tablet 40 mg, 40 mg, Oral, Q breakfast, Mansy, Jan A, MD, 40 mg at 08/15/22 3295   polyvinyl alcohol (LIQUIFILM TEARS) 1.4 % ophthalmic solution 1 drop, 1 drop, Both Eyes, QHS, Mansy, Jan A, MD, 1 drop at 08/14/22 2157   senna-docusate (Senokot-S) tablet 1 tablet, 1 tablet, Oral, QHS PRN, Mansy, Jan A, MD   topiramate  (TOPAMAX) tablet 50 mg, 50 mg, Oral, QPM, Mansy, Jan A, MD, 50 mg at 08/15/22 1739   traZODone (DESYREL) tablet 25 mg, 25 mg, Oral, QHS PRN, Mansy, Arvella Merles, MD   triamcinolone (NASACORT) nasal inhaler 2 spray, 2 spray, Each Nare, Daily PRN, Mansy, Arvella Merles, MD   Vitamin E CAPS 400 Units, 400 Units, Oral, Q breakfast, Mansy, Jan A, MD, 400 Units at 08/15/22 0950 Labs CBC    Component Value Date/Time   WBC 4.6 08/13/2022 1715   RBC 3.33 (L) 08/13/2022 1715   HGB 11.0 (L) 08/13/2022 1715   HGB 11.8 (L) 05/22/2020 0804   HCT 32.5 (L) 08/13/2022 1715   PLT 125 (L) 08/13/2022 1715   PLT 223 05/22/2020 0804   MCV 97.6 08/13/2022 1715   MCH 33.0 08/13/2022 1715   MCHC 33.8 08/13/2022 1715   RDW 11.5 08/13/2022 1715   LYMPHSABS 1.2 08/13/2022 1715   MONOABS 0.5 08/13/2022 1715   EOSABS 0.1 08/13/2022 1715   BASOSABS 0.0 08/13/2022 1715    CMP     Component Value Date/Time   NA 138 08/16/2022 0432   K 3.6 08/16/2022 0432   CL 111 08/16/2022 0432   CO2 23 08/16/2022 0432   GLUCOSE 99 08/16/2022 0432   BUN 14 08/16/2022 0432   CREATININE 0.88 08/16/2022 0432   CREATININE 1.03 (H) 05/22/2020 0804   CALCIUM 9.2 08/16/2022 0432   PROT 6.9 08/13/2022 1715   ALBUMIN 4.1 08/13/2022 1715   AST 22 08/13/2022 1715   AST 17 05/22/2020 0804   ALT 17 08/13/2022 1715   ALT 11 05/22/2020 0804   ALKPHOS 95 08/13/2022 1715   BILITOT 0.6 08/13/2022 1715   BILITOT 0.3 05/22/2020 0804   GFRNONAA >60 08/16/2022 0432   GFRNONAA 52 (L) 05/22/2020 0804   GFRAA 60 (L) 05/22/2020 0804    glycosylated hemoglobin-5.7 LDL 86  Imaging I have reviewed images in epic and the results pertinent to this consultation are: CT head this morning-shows expected evolution of strokes already seen on MRI.  No new areas of hypodensity.  No hemorrhage.  MR of the brain: Scattered acute to early subacute infarcts in the posterior right frontoparietal region, left cerebellum and left temporal occipital region with  minimal petechial blood products at the left cerebellum without hemorrhagic transformation. For additional punctate foci of acute early to subacute ischemia noted within left temporal occipital region which were not seen on the prior scan.  Small volume acute to early subacute right frontoparietal region which is also new from before.   See the angiography head and neck from last admission showed short segment severe stenosis/occlusion of the left P1 segment with reconstitution.  Severe stenosis or short segment occlusion of the proximal left P3.  Moderate stenosis of the distal basilar artery.  Mild bilateral carotid bifurcation atherosclerosis without significant  stenosis.   Lower extremity venous Dopplers did not reveal any evidence of DVT at that time.   2D echocardiogram showed LVEF 60 to 65%, normal mitral valve, normal aortic valve, normal left atrial size and no atrial level shunt with bubble study.  Assessment:  81 year old with past history of breast and uterine cancer presenting last week with vertigo and found to have multifocal acute ischemic strokes in the anterior and posterior circulation, discharged home after stroke work-up completion presented back with dizziness and worsening generalized neurological symptoms with repeat imaging showing new areas of acute ischemic infarction bilateral anterior and posterior circulation raising concern for central source. Transthoracic echo was unremarkable.  Lower extremity Dopplers negative for DVT.  Labs show elevated LDL for which she is on statin.  A1c is within goal.  CT angiography of the head and neck did not reveal any significant stenosis in the anterior circulation although she has multiple stenoses in the posterior circulation. Stroke etiology remains cryptogenic with strong suspicion for embolic etiology. She is scheduled for a transesophageal echocardiogram on Monday. Spoke with the oncologist who has ordered the hypercoagulable  labs-they are pending at this time. He discussed with me the possibility of anticoagulation but hypercoagulability with her cancers is not certain.  My recommendations are below.   Impression: Acute ischemic stroke-cryptogenic for now Evaluate for hypercoagulability versus cardioembolic source. History of breast cancer and aggressive form of uterine cancer with uptrending markers.  Recommendations: Stat CT head ordered-reviewed.  No evidence of hemorrhagic transformation.  Shows expected evolution of the strokes seen on MRI Continue with the plan of TEE on Mon  From an anticoagulation perspective-without clear indication such as atrial fibrillation or hypercoagulability, the stroke still remains of cryptogenic etiology.  Studies looking at recurrent strokes of cryptogenic etiology where anticoagulation has been compared to antiplatelets-there has been no benefit shown for anticoagulation hence no indication from a stroke standpoint for anticoagulation yet.  Dr. Lynett Fish and I discussed this case in person.  She does have an aggressive form of uterine cancer requiring more aggressive treatment at stage I unlike many other cancers but the sarcomatous cancers do not usually cause hypercoagulability and are not associated with a higher stroke risk which also makes me pause to recommend any sort of anticoagulation.  We will follow-up with you after the transesophageal echo.  If there is a clear cause after the transesophageal echo for anticoagulation, we can have that discussion but otherwise, continue outpatient work-up with cardiac monitoring and if there is any evidence of atrial fibrillation, anticoagulant should be considered then.  For now for stroke prevention, aspirin and Plavix for 3 weeks followed by aspirin only.  High intensity statin.  Plan was discussed with Dr. Niel Hummer, and Dr. Rogue Bussing  I will sign out her care to the oncoming neurologist tomorrow.  -- Amie Portland,  MD Neurologist [facility] Pager: [phone]

## 2022-08-16 NOTE — Progress Notes (Signed)
Holter monitor information from patient's son - Body Guardian Mini Plus EL (device), [id number] [employer] [phone].  Dr. Tyrell Antonio made aware.

## 2022-08-16 NOTE — Progress Notes (Addendum)
Patient's left hand remained weak, with slight pronator drift on left arm.  No other neuro deficit nor worsening of symptoms noted for the rest of the day shift up to this time.  Son Richardson Landry and daughter-in-law at bedside.  No complaints offered at this time.  Needs addressed.

## 2022-08-16 NOTE — Progress Notes (Signed)
Paged Dr. Tyrell Antonio, pt c/o numbness on left hand.  Pt seen sitting up in the chair.  Stated "I've called for help to go to the bathroom, I need to go."  Neuro assessment done, no deficit observed.  All extremities equally strong.  A&O x 4.  Patient able to stand up on her own, ambulated to the bathroom.  No other complaints.  Dr. Tyrell Antonio made aware.

## 2022-08-16 NOTE — Progress Notes (Signed)
SUBJECTIVE: Elizabeth Friedman is a 81 y.o. female with a past medical history of gastric reflux, breast and endometrial cancer, hyperlipidemia, recently diagnosed with multiple small CVAs who presented to the emergency department for weakness and slurred speech.  Patient was recently admitted to the hospital on 08/07/2022, diagnosed with multiple small strokes on MRI.  Remainder the patient's work-up was largely nonrevealing and she was discharged home.  Since going home they have noted that the patient's symptoms have seemed to worsen at times with worsening dizziness or off-balance sensation as well as worsening slurred speech.  They noted today the speech seemed particularly worse however it is now gone back to her baseline that she was at at discharge.  Denies any weakness or numbness of any arm or leg.  States currently she feels well.    Vitals:   08/15/22 2210 08/16/22 0019 08/16/22 0620 08/16/22 0743  BP: (!) 140/73 124/72 (!) 154/83 (!) 152/75  Pulse: 73 (!) 59 72 63  Resp: '18 16 16 15  '$ Temp: 97.9 F (36.6 C) 97.8 F (36.6 C) 99 F (37.2 C) (!) 97.5 F (36.4 C)  TempSrc:    Oral  SpO2: 100% 96% 100% 100%  Weight:      Height:        Intake/Output Summary (Last 24 hours) at 08/16/2022 1040 Last data filed at 08/16/2022 0827 Gross per 24 hour  Intake 961.95 ml  Output --  Net 961.95 ml    LABS: Basic Metabolic Panel: Recent Labs    08/13/22 1715 08/16/22 0432  NA 131* 138  K 4.1 3.6  CL 100 111  CO2 23 23  GLUCOSE 91 99  BUN 18 14  CREATININE 0.87 0.88  CALCIUM 9.2 9.2   Liver Function Tests: Recent Labs    08/13/22 1715  AST 22  ALT 17  ALKPHOS 95  BILITOT 0.6  PROT 6.9  ALBUMIN 4.1   No results for input(s): "LIPASE", "AMYLASE" in the last 72 hours. CBC: Recent Labs    08/13/22 1715  WBC 4.6  NEUTROABS 2.8  HGB 11.0*  HCT 32.5*  MCV 97.6  PLT 125*   Cardiac Enzymes: No results for input(s): "CKTOTAL", "CKMB", "CKMBINDEX", "TROPONINI" in the last  72 hours. BNP: Invalid input(s): "POCBNP" D-Dimer: No results for input(s): "DDIMER" in the last 72 hours. Hemoglobin A1C: No results for input(s): "HGBA1C" in the last 72 hours. Fasting Lipid Panel: Recent Labs    08/14/22 0500  CHOL 152  HDL 48  LDLCALC 86  TRIG 91  CHOLHDL 3.2   Thyroid Function Tests: No results for input(s): "TSH", "T4TOTAL", "T3FREE", "THYROIDAB" in the last 72 hours.  Invalid input(s): "FREET3" Anemia Panel: No results for input(s): "VITAMINB12", "FOLATE", "FERRITIN", "TIBC", "IRON", "RETICCTPCT" in the last 72 hours.   PHYSICAL EXAM General: Well developed, well nourished, in no acute distress HEENT:  Normocephalic and atramatic Neck:  No JVD.  Lungs: Clear bilaterally to auscultation and percussion. Heart: HRRR . Normal S1 and S2 without gallops or murmurs.  Abdomen: Bowel sounds are positive, abdomen soft and non-tender  Msk:  Back normal, normal gait. Normal strength and tone for age. Extremities: No clubbing, cyanosis or edema.   Neuro: Alert and oriented X 3. Psych:  Good affect, responds appropriately  TELEMETRY: sinus rhythm, HR 85 bpm  ASSESSMENT AND PLAN: Patient reports developing left hand decreased grip, left arm drift. CT head done this morning showed no new findings compared with CT head from 08/13/22. Patient scheduled for TEE  for Monday, 08/17/22 at 8:30 am. Continue with plan.   Principal Problem:   Acute CVA (cerebrovascular accident) [facility]) Active Problems:   Dyslipidemia   GERD without esophagitis   Migraine   CVA (cerebral vascular accident) ([place])    Triva Hueber, FNP-C 08/16/2022 10:40 AM

## 2022-08-16 NOTE — Progress Notes (Signed)
PROGRESS NOTE    Elizabeth Friedman  GUY:[id number] DOB: [birth year] DOA: 08/13/2022 PCP: Adin Hector, MD   Brief Narrative: 81 year old with past medical history significant for recurrent CVA, dyslipidemia, GERD, osteoarthritis, vitamin B12 deficiency, breast and uterine cancer who was recently admitted for stroke on 10/13 who presents now with dizziness, right thigh paresthesia and tingling, she wake up the morning of admission with the symptoms.  She has been taking aspirin and Plavix.  MRI confirmed early subacute infarct involving the posterior right frontal parietal region, left cerebellum and left temporal occipital region.  Minimal petechial blood products at the level of the cerebellum without hemorrhagic transformation or significant regional mass effect.    Assessment & Plan:   Principal Problem:   Acute CVA (cerebrovascular accident) [facility]) Active Problems:   Dyslipidemia   GERD without esophagitis   Migraine   CVA (cerebral vascular accident) ([place])  1-Acute CVA: -Patient presented with dizziness and right-sided paresthesia. -MRI: Scattered acute to early subacute infarct involving the posterior right frontoparietal region, left cerebellum and left temporal occipital region. -Currently on aspirin and Plavix.  Follow neurology recommendation. -Cardiology consulted for TEE>  Oncology consulted in regards input on hypercoagulability.  If she needs anticoagulation from a hypercoagulable with standpoint, then plan is not to proceed with TEE.  Device will need to be interrogated.  Nurse to contact representative. Permissive hypertension systolic blood pressure goal  160/ PRN dressing and labetalol ordered. Plan for TEE on Monday. Oncology order hypercoagulable panel, recommend TEE no clear evidence she needs anticoagulation from malignancy perspective.  -Patient develops fine motor deficit, left hand decreased grip, left side drift , patient notice deficit at 1;15 am. She said she  call for help.  -I was informed of changes 7;55 am. Came and evaluated patient. She has left hand decreased grip, left arm drift , lower extremities 5/5 strength. I have contacted neurology, Dr Rory Percy. He will come and evaluate patient. I have order IV fluids, Vital check, CBG.   Dyslipidemia: Continue with Lipitor  GERD: Continue with PPI  Migraine: On Topamax   History of  urine and breast cancer: Oncology is consulted to provide input in regards hypercoagulability in the setting of recurrent stroke    Estimated body mass index is 20.32 kg/m as calculated from the following:   Height as of this encounter: '5\' 2"'$  (1.575 m).   Weight as of this encounter: 50.4 kg.   DVT prophylaxis: Lovenox Code Status: DNR Family Communication: Patient.  Disposition Plan:  Status is: Observation The patient will require care spanning > 2 midnights and should be moved to inpatient because: Recurrent stroke    Consultants:  Neurology Oncology  Procedures:    Antimicrobials:    Subjective: She report noticing fine motor problem with her left hand, had difficulty putting eye drop, difficulty holding tooth brush and putting tooth paste. Symptoms notice at 1:15 am.    Objective: Vitals:   08/15/22 2210 08/16/22 0019 08/16/22 0620 08/16/22 0743  BP: (!) 140/73 124/72 (!) 154/83 (!) 152/75  Pulse: 73 (!) 59 72 63  Resp: '18 16 16 15  '$ Temp: 97.9 F (36.6 C) 97.8 F (36.6 C) 99 F (37.2 C) (!) 97.5 F (36.4 C)  TempSrc:    Oral  SpO2: 100% 96% 100% 100%  Weight:      Height:        Intake/Output Summary (Last 24 hours) at 08/16/2022 5638 Last data filed at 08/15/2022 2117 Gross per 24 hour  Intake  861.95 ml  Output --  Net 861.95 ml    Filed Weights   08/14/22 1449  Weight: 50.4 kg    Examination:  General exam: NAD Respiratory system: CTA Cardiovascular system: S 1, S 2 RRR Gastrointestinal system: BS present, soft, nt Central nervous system: Alert Extremities:  No edema   Data Reviewed: I have personally reviewed following labs and imaging studies  CBC: Recent Labs  Lab 08/13/22 1715  WBC 4.6  NEUTROABS 2.8  HGB 11.0*  HCT 32.5*  MCV 97.6  PLT 125*    Basic Metabolic Panel: Recent Labs  Lab 08/13/22 1715 08/16/22 0432  NA 131* 138  K 4.1 3.6  CL 100 111  CO2 23 23  GLUCOSE 91 99  BUN 18 14  CREATININE 0.87 0.88  CALCIUM 9.2 9.2    GFR: Estimated Creatinine Clearance: 39 mL/min (by C-G formula based on SCr of 0.88 mg/dL). Liver Function Tests: Recent Labs  Lab 08/13/22 1715  AST 22  ALT 17  ALKPHOS 95  BILITOT 0.6  PROT 6.9  ALBUMIN 4.1    No results for input(s): "LIPASE", "AMYLASE" in the last 168 hours. No results for input(s): "AMMONIA" in the last 168 hours. Coagulation Profile: Recent Labs  Lab 08/15/22 0909  INR 1.2    Cardiac Enzymes: No results for input(s): "CKTOTAL", "CKMB", "CKMBINDEX", "TROPONINI" in the last 168 hours. BNP (last 3 results) No results for input(s): "PROBNP" in the last 8760 hours. HbA1C: No results for input(s): "HGBA1C" in the last 72 hours. CBG: No results for input(s): "GLUCAP" in the last 168 hours. Lipid Profile: Recent Labs    08/14/22 0500  CHOL 152  HDL 48  LDLCALC 86  TRIG 91  CHOLHDL 3.2    Thyroid Function Tests: No results for input(s): "TSH", "T4TOTAL", "FREET4", "T3FREE", "THYROIDAB" in the last 72 hours. Anemia Panel: No results for input(s): "VITAMINB12", "FOLATE", "FERRITIN", "TIBC", "IRON", "RETICCTPCT" in the last 72 hours. Sepsis Labs: No results for input(s): "PROCALCITON", "LATICACIDVEN" in the last 168 hours.  Recent Results (from the past 240 hour(s))  Resp Panel by RT-PCR (Flu A&B, Covid) Anterior Nasal Swab     Status: None   Collection Time: 08/07/22  2:58 PM   Specimen: Anterior Nasal Swab  Result Value Ref Range Status   SARS Coronavirus 2 by RT PCR NEGATIVE NEGATIVE Final    Comment: (NOTE) SARS-CoV-2 target nucleic acids are  NOT DETECTED.  The SARS-CoV-2 RNA is generally detectable in upper respiratory specimens during the acute phase of infection. The lowest concentration of SARS-CoV-2 viral copies this assay can detect is 138 copies/mL. A negative result does not preclude SARS-Cov-2 infection and should not be used as the sole basis for treatment or other patient management decisions. A negative result may occur with  improper specimen collection/handling, submission of specimen other than nasopharyngeal swab, presence of viral mutation(s) within the areas targeted by this assay, and inadequate number of viral copies(<138 copies/mL). A negative result must be combined with clinical observations, patient history, and epidemiological information. The expected result is Negative.  Fact Sheet for Patients:  EntrepreneurPulse.com.au  Fact Sheet for Healthcare Providers:  IncredibleEmployment.be  This test is no t yet approved or cleared by the [place] FDA and  has been authorized for detection and/or diagnosis of SARS-CoV-2 by FDA under an Emergency Use Authorization (EUA). This EUA will remain  in effect (meaning this test can be used) for the duration of the COVID-19 declaration under Section 564(b)(1) of the Act,  21 U.S.C.section 360bbb-3(b)(1), unless the authorization is terminated  or revoked sooner.       Influenza A by PCR NEGATIVE NEGATIVE Final   Influenza B by PCR NEGATIVE NEGATIVE Final    Comment: (NOTE) The Xpert Xpress SARS-CoV-2/FLU/RSV plus assay is intended as an aid in the diagnosis of influenza from Nasopharyngeal swab specimens and should not be used as a sole basis for treatment. Nasal washings and aspirates are unacceptable for Xpert Xpress SARS-CoV-2/FLU/RSV testing.  Fact Sheet for Patients: EntrepreneurPulse.com.au  Fact Sheet for Healthcare Providers: IncredibleEmployment.be  This test is not  yet approved or cleared by the [place] FDA and has been authorized for detection and/or diagnosis of SARS-CoV-2 by FDA under an Emergency Use Authorization (EUA). This EUA will remain in effect (meaning this test can be used) for the duration of the COVID-19 declaration under Section 564(b)(1) of the Act, 21 U.S.C. section 360bbb-3(b)(1), unless the authorization is terminated or revoked.  Performed at [facility], [address], [place], [place] [address]          Radiology Studies: No results found.      Scheduled Meds:  ascorbic acid  250 mg Oral Daily   aspirin  81 mg Oral Daily   atorvastatin  40 mg Oral Daily   cholecalciferol  5,000 Units Oral Q breakfast   clopidogrel  75 mg Oral Daily   vitamin B-12  500 mcg Oral Daily   cycloSPORINE  1 drop Both Eyes BID   enoxaparin (LOVENOX) injection  40 mg Subcutaneous Q24H   Ferrous Fumarate  1 tablet Oral Daily   gabapentin  400 mg Oral BID   pantoprazole  40 mg Oral Q breakfast   polyvinyl alcohol  1 drop Both Eyes QHS   topiramate  50 mg Oral QPM   vitamin E  400 Units Oral Q breakfast   Continuous Infusions:  sodium chloride       LOS: 2 days    Time spent: 35 Minutes.     Elmarie Shiley, MD [facility]   If 7PM-7AM, please contact night-coverage [email or web address]  08/16/2022, 8:07 AM

## 2022-08-16 NOTE — Progress Notes (Addendum)
Patient states her headache is better.  She said it is better than "whopping headache earlier".  Now she rates her headache as 3/10.  Bedside swallow evaluation done.  No signs of dysphagia noted.  Neuro assessment done.  Left upper arm slightly weak, with slight drift.  BLE equally strong.  Patient remains A&O x 4.  Denies numbness or any paresthesia at this time.  Pt states "It is the fine motor coordination in my left hand that I am having problem with".  Pt prepared for CT.

## 2022-08-17 ENCOUNTER — Inpatient Hospital Stay: Payer: PPO

## 2022-08-17 ENCOUNTER — Inpatient Hospital Stay
Admit: 2022-08-17 | Discharge: 2022-08-17 | Disposition: A | Discharge status: Home / Self Care | Payer: PPO | Attending: Internal Medicine | Admitting: Internal Medicine

## 2022-08-17 ENCOUNTER — Encounter
Admission: EM | Disposition: A | Discharge status: Rehabilitation Facility | Payer: Self-pay | Source: Home / Self Care | Attending: Internal Medicine

## 2022-08-17 DIAGNOSIS — I639 Cerebral infarction, unspecified: Secondary | ICD-10-CM | POA: Diagnosis not present

## 2022-08-17 HISTORY — PX: TEE WITHOUT CARDIOVERSION: SHX5443

## 2022-08-17 LAB — ANTIPHOSPHOLIPID SYNDROME PROF
Anticardiolipin IgG: <9 GPL U/mL (ref 0–14)
Anticardiolipin IgM: 11 MPL U/mL (ref 0–12)
DRVVT: 28.6 s (ref 0.0–47.0)
PTT Lupus Anticoagulant: 28.4 s (ref 0.0–43.5)

## 2022-08-17 LAB — PROTEIN C, TOTAL: Protein C, Total: 92 % (ref 60–150)

## 2022-08-17 LAB — BETA-2-GLYCOPROTEIN I ABS, IGG/M/A
Beta-2 Glyco I IgG: <9 GPI IgG units (ref 0–20)
Beta-2-Glycoprotein I IgA: <9 GPI IgA units (ref 0–25)
Beta-2-Glycoprotein I IgM: <9 GPI IgM units (ref 0–32)

## 2022-08-17 LAB — CBC
HCT: 27.2 % — ABNORMAL LOW (ref 36.0–46.0)
Hemoglobin: 9.4 g/dL — ABNORMAL LOW (ref 12.0–15.0)
MCH: 33.5 pg (ref 26.0–34.0)
MCHC: 34.6 g/dL (ref 30.0–36.0)
MCV: 96.8 fL (ref 80.0–100.0)
Platelets: 127 10*3/uL — ABNORMAL LOW (ref 150–400)
RBC: 2.81 MIL/uL — ABNORMAL LOW (ref 3.87–5.11)
RDW: 11.8 % (ref 11.5–15.5)
WBC: 7.1 10*3/uL (ref 4.0–10.5)
nRBC: 0 % (ref 0.0–0.2)

## 2022-08-17 LAB — D-DIMER, QUANTITATIVE: D-Dimer, Quant: 10.67 ug/mL-FEU — ABNORMAL HIGH (ref 0.00–0.50)

## 2022-08-17 SURGERY — ECHOCARDIOGRAM, TRANSESOPHAGEAL
Anesthesia: Moderate Sedation

## 2022-08-17 MED ORDER — MIDAZOLAM HCL 2 MG/2ML IJ SOLN
INTRAMUSCULAR | Status: AC
  Filled 2022-08-17: qty 4

## 2022-08-17 MED ORDER — AZITHROMYCIN 500 MG PO TABS
500.0000 mg | ORAL_TABLET | ORAL | Status: DC
  Administered 2022-08-19: 500 mg via ORAL
  Filled 2022-08-17: qty 1

## 2022-08-17 MED ORDER — LIDOCAINE VISCOUS HCL 2 % MT SOLN
OROMUCOSAL | Status: AC
  Filled 2022-08-17: qty 15

## 2022-08-17 MED ORDER — BUTAMBEN-TETRACAINE-BENZOCAINE 2-2-14 % EX AERO
INHALATION_SPRAY | CUTANEOUS | Status: AC
  Filled 2022-08-17: qty 5

## 2022-08-17 MED ORDER — IOHEXOL 350 MG/ML SOLN
50.0000 mL | Freq: Once | INTRAVENOUS | Status: AC | PRN
  Administered 2022-08-17: 50 mL via INTRAVENOUS

## 2022-08-17 MED ORDER — GADOBUTROL 1 MMOL/ML IV SOLN
5.0000 mL | Freq: Once | INTRAVENOUS | Status: AC | PRN
  Administered 2022-08-17: 5 mL via INTRAVENOUS

## 2022-08-17 MED ORDER — FENTANYL CITRATE (PF) 100 MCG/2ML IJ SOLN
INTRAMUSCULAR | Status: AC
  Filled 2022-08-17: qty 2

## 2022-08-17 MED ORDER — FENTANYL CITRATE (PF) 100 MCG/2ML IJ SOLN
INTRAMUSCULAR | Status: AC | PRN
  Administered 2022-08-17: 25 ug via INTRAVENOUS

## 2022-08-17 MED ORDER — MIDAZOLAM HCL 2 MG/2ML IJ SOLN
INTRAMUSCULAR | Status: AC | PRN
  Administered 2022-08-17: 1 mg via INTRAVENOUS

## 2022-08-17 MED ORDER — AMLODIPINE BESYLATE 5 MG PO TABS: 5.0000 mg | ORAL_TABLET | Freq: Every day | ORAL | Status: DC

## 2022-08-17 NOTE — Progress Notes (Signed)
Inpatient Rehab Admissions Coordinator:  ? ?Per therapy recommendations,  patient was screened for CIR candidacy by Lataysha Vohra, MS, CCC-SLP. At this time, Pt. Appears to be a a potential candidate for CIR. I will place   order for rehab consult per protocol for full assessment. Please contact me any with questions. ? ?Slayde Brault, MS, CCC-SLP ?Rehab Admissions Coordinator  ?[phone] (celll) ?[phone] (office) ? ?

## 2022-08-17 NOTE — Progress Notes (Signed)
SUBJECTIVE: Patient is feeling fine   Vitals:   08/17/22 0840 08/17/22 0845 08/17/22 0850 08/17/22 0900  BP: (!) 169/76 (!) 167/77 (!) 162/87 (!) 152/79  Pulse: 74 78 80 72  Resp: '13 18 19 16  '$ Temp:      TempSrc:      SpO2: 95% 97% 99% 95%  Weight:      Height:        Intake/Output Summary (Last 24 hours) at 08/17/2022 0903 Last data filed at 08/16/2022 1724 Gross per 24 hour  Intake 910.42 ml  Output --  Net 910.42 ml    LABS: Basic Metabolic Panel: Recent Labs    08/16/22 0432  NA 138  K 3.6  CL 111  CO2 23  GLUCOSE 99  BUN 14  CREATININE 0.88  CALCIUM 9.2   Liver Function Tests: No results for input(s): "AST", "ALT", "ALKPHOS", "BILITOT", "PROT", "ALBUMIN" in the last 72 hours. No results for input(s): "LIPASE", "AMYLASE" in the last 72 hours. CBC: No results for input(s): "WBC", "NEUTROABS", "HGB", "HCT", "MCV", "PLT" in the last 72 hours. Cardiac Enzymes: No results for input(s): "CKTOTAL", "CKMB", "CKMBINDEX", "TROPONINI" in the last 72 hours. BNP: Invalid input(s): "POCBNP" D-Dimer: No results for input(s): "DDIMER" in the last 72 hours. Hemoglobin A1C: No results for input(s): "HGBA1C" in the last 72 hours. Fasting Lipid Panel: No results for input(s): "CHOL", "HDL", "LDLCALC", "TRIG", "CHOLHDL", "LDLDIRECT" in the last 72 hours. Thyroid Function Tests: No results for input(s): "TSH", "T4TOTAL", "T3FREE", "THYROIDAB" in the last 72 hours.  Invalid input(s): "FREET3" Anemia Panel: No results for input(s): "VITAMINB12", "FOLATE", "FERRITIN", "TIBC", "IRON", "RETICCTPCT" in the last 72 hours.   PHYSICAL EXAM General: Well developed, well nourished, in no acute distress HEENT:  Normocephalic and atramatic Neck:  No JVD.  Lungs: Clear bilaterally to auscultation and percussion. Heart: HRRR . Normal S1 and S2 without gallops or murmurs.  Abdomen: Bowel sounds are positive, abdomen soft and non-tender  Msk:  Back normal, normal gait. Normal strength  and tone for age. Extremities: No clubbing, cyanosis or edema.   Neuro: Alert and oriented X 3. Psych:  Good affect, responds appropriately  TELEMETRY: Normal sinus rhythm  ASSESSMENT AND PLAN: Status post CVA with difficulty with handgrip.  TEE was done which showed no evidence of any left atrial left appendage or left ventricular thrombi with bubble study being negative.  Thank you very much for follow-up.  Principal Problem:   Acute CVA (cerebrovascular accident) ([place]) Active Problems:   Dyslipidemia   GERD without esophagitis   Migraine   CVA (cerebral vascular accident) ([place])    Elizabeth Laming A, MD, [facility] 08/17/2022 9:03 AM

## 2022-08-17 NOTE — Progress Notes (Signed)
[phone] Assisted to [facility] , tolerated well. Stroke scale unchanged and neuro assessment unchanged. Remains npo for TEE this am.

## 2022-08-17 NOTE — Progress Notes (Signed)
Timeout conducted ATT but documentation is stuck at [phone]

## 2022-08-17 NOTE — Progress Notes (Signed)
Called [phone] to request Halter monitor interpretation. Fax number [phone]) provided. Oral report results from the 08/14/2022 SR baseline at 67 bpm. Report to be faxed.

## 2022-08-17 NOTE — Progress Notes (Addendum)
MRI brain personally reviewed, agree with radiology:   1. Interval extension of infarction in the right hemisphere, now seen affecting the deep insula and more of the frontoparietal cortical and subcortical brain. This is consistent with progressive right MCA branch vessel infarction. No evidence of hemorrhage or mass effect. 2. Newly seen subcentimeter focus of acute infarction in the left frontal white matter. 3. Some normalization of diffusion at the location of acute infarctions previously seen in the left posterior temporal lobe and left cerebellum. 4. Chronic small-vessel ischemic changes elsewhere throughout the brain as outlined above.   MRI C-spine personally reviewed, agree with radiology:   1. No cord insult. 2. Degenerative spondylosis from C3-4 through C5-6. No cord compression. Foraminal narrowing that could possibly be symptomatic on the left at C3-4 and bilateral at C4-5 and C5-6.  Subsequently I ordered a D-dimer which is significantly elevated at 10.42  Additionally I reviewed report of [place] lung perfusion scan from 10/14 "Lung perfusion scan indeterminate for acute pulmonary embolus. Heterogeneous perfusion may reflect underlying emphysema. Consider chest CTA to further evaluate if patient renal function permits."  Additionally I reviewed report of CT chest abdomen pelvis with contrast on 10/13: 1. Status post hysterectomy without soft tissue nodularity along the vaginal cuff. 2. Trace pelvic free fluid and pelvic peritoneal thickening with similar mild mesenteric edema and heterogeneity of the omental fat. No discrete peritoneal or omental nodularity. 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 Consider attention on short-term interval follow-up imaging. 3. Bronchiectasis with peripheral airway impaction in the right lower lobe and adjacent tree-in-bud nodules as well as additional tree-in-bud nodularity in the right middle lobe,  lingula and scattered throughout the right lower lobe. Findings are favored to reflect an atypical infectious or inflammatory process such as atypical mycobacterial infection. 4. Stable ground-glass opacities in the left lower lobe measuring up to 7 mm, nonspecific possibly reflecting an infectious or inflammatory process. Attention on follow-up imaging suggested. 5. Gas fluid levels throughout the colon as can be seen with diarrheal illness 6.  Aortic Atherosclerosis (ICD10-I70.0).  "Additional reviewed Dr. Teodoro Kil note from 07/09/2022 -Non-cystic fibrosis bronchiectasis - she has chronic cough  - she had walking pneumonia last year and did well  -would benefit from TIW zithromax x 3 months and possibly long term -currently on Tessalon PRN -Patient has Bronchiectasis and has had a daily productive cough for more than six continuous months with frequent exacerbations requiring antibiotic therapy. Patient has also tried and failed PEP and breathing techniques, therefore AffloVest is the recommended treatment."    Plan: Discussed with Dr. Tyrell Antonio, who will place the appropriate order for CTA chest to better evaluate for potential PE, which may give [place] a reason for anticoagulation.  If this is positive, will start anticoagulation, low goal no bolus heparin drip due to recent strokes, with transition to a long-term agent if patient tolerates the drip well, anticoagulation agent to be discussed with oncology Indication  Bolus (units/kg)  Infusion rate (units/kg/hr)  Goal heparin level  Goal aPTT   CVA/TIA  None  12-14  0.3-0.5  66-85    Family and patient updated at bedside including review of MRI brain with them  Lesleigh Noe MD-PhD [facility] [phone] [facility] coverage for [facility] is from 8 AM to 4 AM in-house and 4 PM to 8 PM by telephone/video. 8 PM to 8 AM emergent questions or overnight urgent questions should be addressed to Teleneurology On-call or  [facility]ac[facility] Pontes neurohospitalist; contact information can be  found on AMION

## 2022-08-17 NOTE — Progress Notes (Signed)
Occupational Therapy Treatment Patient Details Name: Elizabeth Friedman MRN: [id number] DOB: Jan 02, 1941 Today's Date: 08/17/2022   History of present illness Pt is an 81 year old female admitted  with recurrent subacute to acute CVA, concerning for central involvement source after presenting to ED with dizziness and right-sided paresthesias; PMH significant for past history of breast and uterine cancer, recurrent CVA, dyslipidemia, GERD, Osteoarthritis, vitamin B12 deficiency and migraine   OT comments  Pt received semi-reclined in bed; daughter Elizabeth Friedman at bedside; another family member exiting the room. Appearing alert and ready for therapy; willing to work with OT on assessment of LUE (per PT report, pt with new LUE deficits); pt reporting that LUE deficits have been present since yesterday. T/f to [facility] for toileting MIN A RW. See flowsheet below for further details of session. Left semi-reclined in bed with all needs in reach, daughter in room. OT reported pt status to NA who was taking vitals as OT left the room.   Patient will benefit from continued OT while in acute care. OT discussed updated d/c recommendation for acute rehab with daughter and patient; both are in agreement. Pt can tolerate 3 hours of therapy per day, requires that level of rehab for returning to independent level of function, and is highly motivated to participate in therapy. Has new LUE and L visual field deficits.     Recommendations for follow up therapy are one component of a multi-disciplinary discharge planning process, led by the attending physician.  Recommendations may be updated based on patient status, additional functional criteria and insurance authorization.    Follow Up Recommendations  Acute inpatient rehab (3hours/day)    Assistance Recommended at Discharge Intermittent Supervision/Assistance  Patient can return home with the following  A lot of help with walking and/or transfers;A lot of help with  bathing/dressing/bathroom;Assistance with cooking/housework;Direct supervision/assist for medications management;Direct supervision/assist for financial management;Assist for transportation;Help with stairs or ramp for entrance   Equipment Recommendations  Other (comment) (pending next venue of care)    Recommendations for Other Services      Precautions / Restrictions Precautions Precautions: Fall Precaution Comments: Also, pt needs cues to attend to LUE during transfers/mobility. Restrictions Weight Bearing Restrictions: No       Mobility Bed Mobility Overal bed mobility: Needs Assistance Bed Mobility: Supine to Sit, Sit to Supine     Supine to sit: Min guard     General bed mobility comments: Needs cues for LUE management.    Transfers Overall transfer level: Needs assistance Equipment used: Rolling walker (2 wheels) Transfers: Sit to/from Stand Sit to Stand: Min assist           General transfer comment: Cues and extra time for placing L hand on RW; CGA on L hand to maintain position; cue for pushing up from bed with RUE rather than placing R hand on RW also.     Balance Overall balance assessment: Needs assistance Sitting-balance support: Feet supported Sitting balance-Leahy Scale: Good     Standing balance support: Bilateral upper extremity supported Standing balance-Leahy Scale: Fair                             ADL either performed or assessed with clinical judgement   ADL Overall ADL's : Needs assistance/impaired                         Toilet Transfer: Minimal Print production planner Details (  indicate cue type and reason): MIN A with RW for balance; OT cues for LUE attention and movement of BIL hands for sit to stand and stand to sit. Toileting- Clothing Manipulation and Hygiene: Set up;Moderate assistance (MOD A clothing management from standing at RW; set up for hygiene from sitting using RUE. Pt is right-handed.)          General ADL Comments: Decreased in ADL function since recent LUE deficits extended.    Extremity/Trunk Assessment Upper Extremity Assessment Upper Extremity Assessment: LUE deficits/detail LUE Deficits / Details: LUE proprioception deficits in L hand/fingers; appears intact at wrist and elbow. Sensation appears inact. Significant LUE coordination deficits in [facility] and [place]. Delayed response in LUE. Moderate L neglect of LUE. LUE Sensation: decreased proprioception LUE Coordination: decreased fine motor;decreased gross motor            Vision   Vision Assessment?: Yes Eye Alignment: Within Functional Limits Additional Comments: Pt with decreased L peripheral vision upon testing. Education provided on safety with L visual deficits.   Perception     Praxis      Cognition Arousal/Alertness: Awake/alert Behavior During Therapy: WFL for tasks assessed/performed Overall Cognitive Status: Within Functional Limits for tasks assessed                                 General Comments: Very pleasant, follows all cues to the best of her ability. Supportive daughter in room during session. Pt states that today is Tuesday, October 22 or 23 (today is Mon, Oct 23); pt agreeable when OT provided correct date.        Exercises      Shoulder Instructions       General Comments      Pertinent Vitals/ Pain       Pain Assessment Pain Assessment: No/denies pain  Home Living                                          Prior Functioning/Environment              Frequency  Min 3X/week (Up to 7x/week)        Progress Toward Goals  OT Goals(current goals can now be found in the care plan section)  Progress towards OT goals: Progressing toward goals  Acute Rehab OT Goals Patient Stated Goal: Get to rehab to get better OT Goal Formulation: With patient/family Time For Goal Achievement: 08/28/22 Potential to Achieve Goals: Good ADL Goals Pt Will  Perform Grooming: with modified independence Pt Will Perform Lower Body Dressing: with modified independence Pt Will Transfer to Toilet: with modified independence Pt Will Perform Toileting - Clothing Manipulation and hygiene: with modified independence  Plan Discharge plan needs to be updated;Frequency needs to be updated    Co-evaluation                 AM-PAC OT "6 Clicks" Daily Activity     Outcome Measure   Help from another person eating meals?: A Little (will need assist for cutting up food) Help from another person taking care of personal grooming?: A Little Help from another person toileting, which includes using toliet, bedpan, or urinal?: A Lot Help from another person bathing (including washing, rinsing, drying)?: A Lot Help from another person to put on and taking off regular upper body  clothing?: A Little Help from another person to put on and taking off regular lower body clothing?: A Lot 6 Click Score: 15    End of Session Equipment Utilized During Treatment: Rolling walker (2 wheels)  OT Visit Diagnosis: Unsteadiness on feet (R26.81);Muscle weakness (generalized) (M62.81)   Activity Tolerance Patient tolerated treatment well   Patient Left in bed;with call bell/phone within reach;with family/visitor present   Nurse Communication Mobility status (communicated BSC t/f status to NA)        Time: 0867-6195 OT Time Calculation (min): 42 min  Charges: OT General Charges $OT Visit: 1 Visit OT Treatments $Self Care/Home Management : 8-22 mins $Neuromuscular Re-education: 23-37 mins  Waymon Amato, MS, OTR/L   Vania Rea 08/17/2022, 3:59 PM

## 2022-08-17 NOTE — Progress Notes (Signed)
*  PRELIMINARY RESULTS* Echocardiogram Echocardiogram Transesophageal has been performed.  Sherrie Sport 08/17/2022, 8:54 AM

## 2022-08-17 NOTE — Progress Notes (Addendum)
Neurology Progress Note   S:// Seen and examined. Today her gait is ataxic and she had difficulty using her LUE to use walker TEE was done which showed no evidence of any left atrial left appendage or left ventricular thrombi with bubble study being negative.   O:// Current vital signs: BP (!) 168/83 (BP Location: Right Arm)   Pulse 76   Temp 98.2 F (36.8 C)   Resp 19   Ht '5\' 2"'$  (1.575 m)   Wt 46.3 kg   SpO2 97%   BMI 18.66 kg/m  Vital signs in last 24 hours: Temp:  [97.8 F (36.6 C)-98.3 F (36.8 C)] 98.2 F (36.8 C) (10/23 1017) Pulse Rate:  [69-109] 76 (10/23 1017) Resp:  [13-25] 19 (10/23 1017) BP: (140-176)/(75-93) 168/83 (10/23 1017) SpO2:  [95 %-100 %] 97 % (10/23 1017) Weight:  [46.3 kg] 46.3 kg (10/23 0802) Gen: Awake alert in no distress HEENT: Normocephalic atraumatic Lungs: Clear Cardiovascular: Regular rate rhythm Abdomen nondistended nontender Extremities warm well perfused without any evidence of edema Neurological exam She is awake alert oriented but sometimes slow to respond There is some mild dysarthria  No evidence of aphasia-able to provide clear history. Some left NLF flattening. Saccadic pursuits Motor examination is notable for 4- to 5 strength throughout the LUE with coaching, flexion a little stronger than extension worse distally than proximally. However she does have pronator drift of the LUE today which is worse than yesterday and there is slight drift of the LLE with 4/5 hip flexion compared to 5/5 on the right.  Sensation is intact to light touch/temperature throughout all four extremities  Coordination exam-no dysmetria (not out of proporption to weakness) Gait testing was deferred at this time NIH stroke scale- now 2 for left arm and leg drift (sensory resolved)  Medications  Current Facility-Administered Medications:    0.9 %  sodium chloride infusion, , Intravenous, Continuous, Regalado, Belkys A, MD, Last Rate: 75 mL/hr at 08/17/22  1154, New Bag at 08/17/22 1154   acetaminophen (TYLENOL) tablet 650 mg, 650 mg, Oral, Q4H PRN, 650 mg at 08/15/22 2123 **OR** acetaminophen (TYLENOL) 160 MG/5ML solution 650 mg, 650 mg, Per Tube, Q4H PRN **OR** acetaminophen (TYLENOL) suppository 650 mg, 650 mg, Rectal, Q4H PRN, Mansy, Jan A, MD   amLODipine (NORVASC) tablet 5 mg, 5 mg, Oral, Daily, Regalado, Belkys A, MD   ascorbic acid (VITAMIN C) tablet 250 mg, 250 mg, Oral, Daily, Mansy, Jan A, MD, 250 mg at 08/16/22 1002   aspirin chewable tablet 81 mg, 81 mg, Oral, Daily, Mansy, Jan A, MD, 81 mg at 08/16/22 1002   atorvastatin (LIPITOR) tablet 40 mg, 40 mg, Oral, Daily, Mansy, Jan A, MD, 40 mg at 08/16/22 1002   butamben-tetracaine-benzocaine (CETACAINE) 11-27-12 % spray, , , ,    cholecalciferol (VITAMIN D3) tablet 5,000 Units, 5,000 Units, Oral, Q breakfast, Mansy, Jan A, MD, 5,000 Units at 08/16/22 5284   clopidogrel (PLAVIX) tablet 75 mg, 75 mg, Oral, Daily, Mansy, Jan A, MD, 75 mg at 08/16/22 1002   cyanocobalamin (VITAMIN B12) tablet 500 mcg, 500 mcg, Oral, Daily, Mansy, Jan A, MD, 500 mcg at 08/16/22 1002   cycloSPORINE (RESTASIS) 0.05 % ophthalmic emulsion 1 drop, 1 drop, Both Eyes, BID, Mansy, Jan A, MD, 1 drop at 08/16/22 2132   enoxaparin (LOVENOX) injection 40 mg, 40 mg, Subcutaneous, Q24H, Mansy, Jan A, MD, 40 mg at 08/16/22 1001   fentaNYL (SUBLIMAZE) 100 MCG/2ML injection, , , ,    Ferrous Fumarate (  HEMOCYTE - 106 mg FE) tablet 106 mg of iron, 1 tablet, Oral, Daily, Mansy, Jan A, MD, 106 mg of iron at 08/16/22 1011   gabapentin (NEURONTIN) capsule 400 mg, 400 mg, Oral, BID, Mansy, Jan A, MD, 400 mg at 08/16/22 2130   hydrALAZINE (APRESOLINE) injection 10 mg, 10 mg, Intravenous, Q4H PRN, Regalado, Belkys A, MD   labetalol (NORMODYNE) injection 5 mg, 5 mg, Intravenous, Q2H PRN, Regalado, Belkys A, MD   lidocaine (XYLOCAINE) 2 % viscous mouth solution, , , ,    midazolam (VERSED) 2 MG/2ML injection, , , ,    ondansetron (ZOFRAN)  injection 4 mg, 4 mg, Intravenous, Q4H PRN, Mansy, Jan A, MD   pantoprazole (PROTONIX) EC tablet 40 mg, 40 mg, Oral, Q breakfast, Mansy, Jan A, MD, 40 mg at 08/16/22 7026   polyvinyl alcohol (LIQUIFILM TEARS) 1.4 % ophthalmic solution 1 drop, 1 drop, Both Eyes, QHS, Mansy, Jan A, MD, 1 drop at 08/16/22 2131   senna-docusate (Senokot-S) tablet 1 tablet, 1 tablet, Oral, QHS PRN, Mansy, Jan A, MD   topiramate (TOPAMAX) tablet 50 mg, 50 mg, Oral, QPM, Mansy, Jan A, MD, 50 mg at 08/16/22 1724   traZODone (DESYREL) tablet 25 mg, 25 mg, Oral, QHS PRN, Mansy, Arvella Merles, MD   triamcinolone (NASACORT) nasal inhaler 2 spray, 2 spray, Each Nare, Daily PRN, Mansy, Arvella Merles, MD   Vitamin E CAPS 400 Units, 400 Units, Oral, Q breakfast, Mansy, Jan A, MD, 400 Units at 08/16/22 1012 Labs CBC    Component Value Date/Time   WBC 4.6 08/13/2022 1715   RBC 3.33 (L) 08/13/2022 1715   HGB 11.0 (L) 08/13/2022 1715   HGB 11.8 (L) 05/22/2020 0804   HCT 32.5 (L) 08/13/2022 1715   PLT 125 (L) 08/13/2022 1715   PLT 223 05/22/2020 0804   MCV 97.6 08/13/2022 1715   MCH 33.0 08/13/2022 1715   MCHC 33.8 08/13/2022 1715   RDW 11.5 08/13/2022 1715   LYMPHSABS 1.2 08/13/2022 1715   MONOABS 0.5 08/13/2022 1715   EOSABS 0.1 08/13/2022 1715   BASOSABS 0.0 08/13/2022 1715    CMP     Component Value Date/Time   NA 138 08/16/2022 0432   K 3.6 08/16/2022 0432   CL 111 08/16/2022 0432   CO2 23 08/16/2022 0432   GLUCOSE 99 08/16/2022 0432   BUN 14 08/16/2022 0432   CREATININE 0.88 08/16/2022 0432   CREATININE 1.03 (H) 05/22/2020 0804   CALCIUM 9.2 08/16/2022 0432   PROT 6.9 08/13/2022 1715   ALBUMIN 4.1 08/13/2022 1715   AST 22 08/13/2022 1715   AST 17 05/22/2020 0804   ALT 17 08/13/2022 1715   ALT 11 05/22/2020 0804   ALKPHOS 95 08/13/2022 1715   BILITOT 0.6 08/13/2022 1715   BILITOT 0.3 05/22/2020 0804   GFRNONAA >60 08/16/2022 0432   GFRNONAA 52 (L) 05/22/2020 0804   GFRAA 60 (L) 05/22/2020 0804    glycosylated  hemoglobin-5.7 LDL 86  Imaging I have reviewed images in epic and the results pertinent to this consultation are: CT head this morning-shows expected evolution of strokes already seen on MRI.  No new areas of hypodensity.  No hemorrhage.  MR of the brain: Scattered acute to early subacute infarcts in the posterior right frontoparietal region, left cerebellum and left temporal occipital region with minimal petechial blood products at the left cerebellum without hemorrhagic transformation. For additional punctate foci of acute early to subacute ischemia noted within left temporal occipital region which were not  seen on the prior scan.  Small volume acute to early subacute right frontoparietal region which is also new from before.   See the angiography head and neck from last admission showed short segment severe stenosis/occlusion of the left P1 segment with reconstitution.  Severe stenosis or short segment occlusion of the proximal left P3.  Moderate stenosis of the distal basilar artery.  Mild bilateral carotid bifurcation atherosclerosis without significant stenosis.   Lower extremity venous Dopplers did not reveal any evidence of DVT at that time.   2D echocardiogram showed LVEF 60 to 65%, normal mitral valve, normal aortic valve, normal left atrial size and no atrial level shunt with bubble study.  Assessment:  81 year old with past history of breast and uterine cancer presenting last week with vertigo and found to have multifocal acute ischemic strokes in the anterior and posterior circulation, discharged home after stroke work-up completion presented back with dizziness and worsening generalized neurological symptoms with repeat imaging showing new areas of acute ischemic infarction bilateral anterior and posterior circulation raising concern for central source.  Workup summary: Transthoracic echo was unremarkable.  Lower extremity Dopplers negative for DVT.  Labs show elevated LDL for which  she is on statin.  A1c is within goal.  CT angiography of the head and neck did not reveal any significant stenosis in the anterior circulation although she has multiple stenoses in the posterior circulation. Stroke etiology remains cryptogenic with strong suspicion for embolic etiology, but event monitor here negative to date even with worsening exam c/f additional events during hospitalization TEE was done which showed no evidence of any left atrial left appendage or left ventricular thrombi with bubble study being negative.  Hypercoagulable labs ordered by oncology -they are pending at this time unless result listed below  Protein S 83% (ref 60-150%)  Pending: Protein C, Factor 5 Leiden, Prothrombin gene mutation, Anti-phospholipid syndrome profile, beta-2-glycoprotein IgG/M/A  Exam today concerning for worsening weakness of the left side (arm and leg) vs. subtle neglect   Impression: Recurrent acute ischemic stroke in multiple vascular territories - cryptogenic for now Evaluate for hypercoagulability versus cardioembolic source. History of breast cancer and aggressive form of uterine cancer with uptrending markers.  Recommendations: From an anticoagulation perspective-without clear indication such as atrial fibrillation or hypercoagulability, the stroke still remains of cryptogenic etiology.  Agree with Dr. Rory Percy studies looking at recurrent strokes of cryptogenic etiology where anticoagulation has been compared to antiplatelets-there has been no benefit shown for anticoagulation hence no indication from a stroke standpoint for anticoagulation yet.  TEE is negative and monitoring has been negative here. Will get MRI brain and C-spine w and w/o contrast due to malignancy history to evaluate for any additional pathology given exam is worsening again today.   For now for stroke prevention, aspirin and Plavix for 3 weeks followed by aspirin only.  High intensity statin. May adjust plan if, for  example, if imaging suggests LP may be useful might need to hold Plavix  Plan was discussed with Dr. Niel Hummer, son and patient at bedside  Lesleigh Noe MD-PhD [facility] [phone]  [facility] coverage for [facility] is from 8 AM to 4 AM in-house and 4 PM to 8 PM by telephone/video. 8 PM to 8 AM emergent questions or overnight urgent questions should be addressed to Teleneurology On-call or [facility] neurohospitalist; contact information can be found on AMION  Greater than 50 min spent in care of patient, majority at bedside.

## 2022-08-17 NOTE — Progress Notes (Signed)
PT Cancellation Note  Patient Details Name: Elizabeth Friedman MRN: [id number] DOB: 07/18/1941   Cancelled Treatment:    Reason Eval/Treat Not Completed: Patient at procedure or test/unavailable. Out of the room for TEE. Will re-attempt.   Xenia Nile 08/17/2022, 8:45 AM Greggory Stallion, PT, DPT, [facility] [phone]

## 2022-08-17 NOTE — Progress Notes (Signed)
Physical Therapy Treatment Patient Details Name: Elizabeth Friedman MRN: [id number] DOB: 30-Oct-1940 Today's Date: 08/17/2022   History of Present Illness Pt is an 81 year old female admitted  with recurrent subacute to acute CVA, concerning for central involvement source after presenting to ED with dizziness and right-sided paresthesias; PMH significant for past history of breast and uterine cancer, recurrent CVA, dyslipidemia, GERD, Osteoarthritis, vitamin B12 deficiency and migraine    PT Comments    Treatment session provided this date. Pt expresses concerns of new weakness starting yesterday. Upon assessment, L arm weaker (distal>proximal) with decreased grip/coordination. Pt with difficulty holding onto walker and drags across bed during mobility. B LE appear equal strength, however ataxic with mobility with crossover gait demonstrating decreased base of support and step length. Fatigues with longer distance ambulation and demonstrates worsening this session. Once seated in chair, pillow placed under L arm to assist for joint protection. Pt pleased to be OOB, however overwhelmed with increased deficits. Discussion with family and secure chat sent to care team. Recommending CIR as pt with great family support and pt was living indep previously. Son reports he is creating a room at his home if needed to assist with caring for patient, however this wouldn't be able to be long term solution. OT with plan to assess this date. Updating frequency to QD. Will continue to progress.   Recommendations for follow up therapy are one component of a multi-disciplinary discharge planning process, led by the attending physician.  Recommendations may be updated based on patient status, additional functional criteria and insurance authorization.  Follow Up Recommendations  Acute inpatient rehab (3hours/day) Can patient physically be transported by private vehicle: Yes   Assistance Recommended at Discharge  Intermittent Supervision/Assistance  Patient can return home with the following A lot of help with walking and/or transfers;A lot of help with bathing/dressing/bathroom;Assist for transportation;Help with stairs or ramp for entrance   Equipment Recommendations   (TBD)    Recommendations for Other Services       Precautions / Restrictions Precautions Precautions: Fall Restrictions Weight Bearing Restrictions: No     Mobility  Bed Mobility Overal bed mobility: Needs Assistance Bed Mobility: Supine to Sit, Sit to Supine     Supine to sit: Min guard     General bed mobility comments: drags L arm, appears to have some inattention    Transfers Overall transfer level: Needs assistance Equipment used: Rolling walker (2 wheels) Transfers: Sit to/from Stand Sit to Stand: Mod assist           General transfer comment: needs assist placing L arm on RW. Heavy cues for sequencing for transfer. Once standing, upright posture. Decreased grip strength on RW    Ambulation/Gait Ambulation/Gait assistance: Mod assist, Min assist Gait Distance (Feet): 80 Feet Assistive device: Rolling walker (2 wheels) Gait Pattern/deviations: Ataxic, Narrow base of support, Step-through pattern       General Gait Details: ataxic gait with crossing midling and buckling noted in B knees. Needs increased cues/sequencing for guidance of RW. Tends to keep too far away from body. Follows commands well.   Stairs             Wheelchair Mobility    Modified Rankin (Stroke Patients Only)       Balance Overall balance assessment: Needs assistance Sitting-balance support: Feet supported Sitting balance-Leahy Scale: Good     Standing balance support: Bilateral upper extremity supported Standing balance-Leahy Scale: Fair  Cognition Arousal/Alertness: Awake/alert Behavior During Therapy: WFL for tasks assessed/performed Overall Cognitive Status:  Within Functional Limits for tasks assessed                                 General Comments: very pleasant and agreeable to session        Exercises      General Comments        Pertinent Vitals/Pain Pain Assessment Pain Assessment: No/denies pain    Home Living                          Prior Function            PT Goals (current goals can now be found in the care plan section) Acute Rehab PT Goals Patient Stated Goal: get back home PT Goal Formulation: With patient Time For Goal Achievement: 08/28/22 Potential to Achieve Goals: Good Progress towards PT goals: Progressing toward goals    Frequency    7X/week      PT Plan Discharge plan needs to be updated;Frequency needs to be updated    Co-evaluation              AM-PAC PT "6 Clicks" Mobility   Outcome Measure  Help needed turning from your back to your side while in a flat bed without using bedrails?: A Little Help needed moving from lying on your back to sitting on the side of a flat bed without using bedrails?: A Little Help needed moving to and from a bed to a chair (including a wheelchair)?: A Little Help needed standing up from a chair using your arms (e.g., wheelchair or bedside chair)?: A Lot Help needed to walk in hospital room?: A Lot Help needed climbing 3-5 steps with a railing? : Total 6 Click Score: 14    End of Session Equipment Utilized During Treatment: Gait belt Activity Tolerance: Patient tolerated treatment well Patient left: in chair;with family/visitor present Nurse Communication: Mobility status PT Visit Diagnosis: Muscle weakness (generalized) (M62.81);Ataxic gait (R26.0);Difficulty in walking, not elsewhere classified (R26.2);Unsteadiness on feet (R26.81)     Time: 2683-4196 PT Time Calculation (min) (ACUTE ONLY): 41 min  Charges:  $Gait Training: 23-37 mins $Therapeutic Activity: 8-22 mins                     Greggory Stallion, PT, DPT,  [facility] [phone]    Elizabeth Friedman 08/17/2022, 11:57 AM

## 2022-08-17 NOTE — Care Management Important Message (Signed)
Important Message  Patient Details  Name: LYNETT BRASIL MRN: [id number] Date of Birth: October 07, 1941   Medicare Important Message Given:  Yes     Dannette Barbara 08/17/2022, 3:47 PM

## 2022-08-17 NOTE — Progress Notes (Addendum)
PROGRESS NOTE    Elizabeth Friedman  LZJ:[id number] DOB: 10/12/1941 DOA: 08/13/2022 PCP: Adin Hector, MD   Brief Narrative: 81 year old with past medical history significant for recurrent CVA, dyslipidemia, GERD, osteoarthritis, vitamin B12 deficiency, breast and uterine cancer who was recently admitted for stroke on 10/13 who presents now with dizziness, right thigh paresthesia and tingling, she wake up the morning of admission with the symptoms.  She has been taking aspirin and Plavix.  MRI confirmed early subacute infarct involving the posterior right frontal parietal region, left cerebellum and left temporal occipital region.  Minimal petechial blood products at the level of the cerebellum without hemorrhagic transformation or significant regional mass effect.    Assessment & Plan:   Principal Problem:   Acute CVA (cerebrovascular accident) [facility]) Active Problems:   Dyslipidemia   GERD without esophagitis   Migraine   CVA (cerebral vascular accident) ([facility])  1-Acute CVA: -Patient presented with dizziness and right-sided paresthesia. -MRI: Scattered acute to early subacute infarct involving the posterior right frontoparietal region, left cerebellum and left temporal occipital region. -Currently on aspirin and Plavix.  Follow neurology recommendation. -TEE negative -Oncology order hypercoagulable panel,  no clear evidence she needs anticoagulation from malignancy perspective.  -10/22: Patient develops fine motor deficit, left hand decreased grip, left side drift , patient notice deficit at 1;15 am.  -Plan to continue with aspirin and plavix for 3 weeks then aspirin alone.  -Plan to repeat MRI due to gait abnormalities and Left LE weakness.   Dyslipidemia: Continue with Lipitor  GERD: Continue with PPI  Migraine: On Topamax Bronchiectasis. Suppose to be on Azithromycin for chronic bronchiectasis for 3 month TIW per Dr Lanney Gins  History of  urine and breast cancer: Oncology  is consulted to provide input in regards hypercoagulability in the setting of recurrent stroke    Estimated body mass index is 18.66 kg/m as calculated from the following:   Height as of this encounter: '5\' 2"'$  (0.240 m).   Weight as of this encounter: 46.3 kg.   DVT prophylaxis: Lovenox Code Status: DNR Family Communication: Patient.  Disposition Plan:  Status is: Observation The patient will require care spanning > 2 midnights and should be moved to inpatient because: Recurrent stroke. CIR evaluation.     Consultants:  Neurology Oncology  Procedures:    Antimicrobials:    Subjective: Left hand weakness, same. She is notice to have left LE drift.    Objective: Vitals:   08/17/22 0900 08/17/22 0915 08/17/22 0921 08/17/22 1017  BP: (!) 152/79 (!) 176/84  (!) 168/83  Pulse: 72 84 76 76  Resp: '16 18 15 19  '$ Temp:    98.2 F (36.8 C)  TempSrc:      SpO2: 95% 97% 100% 97%  Weight:      Height:        Intake/Output Summary (Last 24 hours) at 08/17/2022 1411 Last data filed at 08/17/2022 1135 Gross per 24 hour  Intake 1766.56 ml  Output --  Net 1766.56 ml    Filed Weights   08/14/22 1449 08/17/22 0802  Weight: 50.4 kg 46.3 kg    Examination:  General exam: NAD Respiratory system: CTA Cardiovascular system: S 1, S 2 RRR Gastrointestinal system: BS present Central nervous system: alert, follows command, speech clear Extremities: No edema   Data Reviewed: I have personally reviewed following labs and imaging studies  CBC: Recent Labs  Lab 08/13/22 1715  WBC 4.6  NEUTROABS 2.8  HGB 11.0*  HCT 32.5*  MCV 97.6  PLT 125*    Basic Metabolic Panel: Recent Labs  Lab 08/13/22 1715 08/16/22 0432  NA 131* 138  K 4.1 3.6  CL 100 111  CO2 23 23  GLUCOSE 91 99  BUN 18 14  CREATININE 0.87 0.88  CALCIUM 9.2 9.2    GFR: Estimated Creatinine Clearance: 36 mL/min (by C-G formula based on SCr of 0.88 mg/dL). Liver Function Tests: Recent Labs  Lab  08/13/22 1715  AST 22  ALT 17  ALKPHOS 95  BILITOT 0.6  PROT 6.9  ALBUMIN 4.1    No results for input(s): "LIPASE", "AMYLASE" in the last 168 hours. No results for input(s): "AMMONIA" in the last 168 hours. Coagulation Profile: Recent Labs  Lab 08/15/22 0909  INR 1.2    Cardiac Enzymes: No results for input(s): "CKTOTAL", "CKMB", "CKMBINDEX", "TROPONINI" in the last 168 hours. BNP (last 3 results) No results for input(s): "PROBNP" in the last 8760 hours. HbA1C: No results for input(s): "HGBA1C" in the last 72 hours. CBG: Recent Labs  Lab 08/16/22 0831  GLUCAP 85   Lipid Profile: No results for input(s): "CHOL", "HDL", "LDLCALC", "TRIG", "CHOLHDL", "LDLDIRECT" in the last 72 hours.  Thyroid Function Tests: No results for input(s): "TSH", "T4TOTAL", "FREET4", "T3FREE", "THYROIDAB" in the last 72 hours. Anemia Panel: No results for input(s): "VITAMINB12", "FOLATE", "FERRITIN", "TIBC", "IRON", "RETICCTPCT" in the last 72 hours. Sepsis Labs: No results for input(s): "PROCALCITON", "LATICACIDVEN" in the last 168 hours.  Recent Results (from the past 240 hour(s))  Resp Panel by RT-PCR (Flu A&B, Covid) Anterior Nasal Swab     Status: None   Collection Time: 08/07/22  2:58 PM   Specimen: Anterior Nasal Swab  Result Value Ref Range Status   SARS Coronavirus 2 by RT PCR NEGATIVE NEGATIVE Final    Comment: (NOTE) SARS-CoV-2 target nucleic acids are NOT DETECTED.  The SARS-CoV-2 RNA is generally detectable in upper respiratory specimens during the acute phase of infection. The lowest concentration of SARS-CoV-2 viral copies this assay can detect is 138 copies/mL. A negative result does not preclude SARS-Cov-2 infection and should not be used as the sole basis for treatment or other patient management decisions. A negative result may occur with  improper specimen collection/handling, submission of specimen other than nasopharyngeal swab, presence of viral mutation(s) within  the areas targeted by this assay, and inadequate number of viral copies(<138 copies/mL). A negative result must be combined with clinical observations, patient history, and epidemiological information. The expected result is Negative.  Fact Sheet for Patients:  EntrepreneurPulse.com.au  Fact Sheet for Healthcare Providers:  IncredibleEmployment.be  This test is no t yet approved or cleared by the [place] FDA and  has been authorized for detection and/or diagnosis of SARS-CoV-2 by FDA under an Emergency Use Authorization (EUA). This EUA will remain  in effect (meaning this test can be used) for the duration of the COVID-19 declaration under Section 564(b)(1) of the Act, 21 U.S.C.section 360bbb-3(b)(1), unless the authorization is terminated  or revoked sooner.       Influenza A by PCR NEGATIVE NEGATIVE Final   Influenza B by PCR NEGATIVE NEGATIVE Final    Comment: (NOTE) The Xpert Xpress SARS-CoV-2/FLU/RSV plus assay is intended as an aid in the diagnosis of influenza from Nasopharyngeal swab specimens and should not be used as a sole basis for treatment. Nasal washings and aspirates are unacceptable for Xpert Xpress SARS-CoV-2/FLU/RSV testing.  Fact Sheet for Patients: EntrepreneurPulse.com.au  Fact Sheet for Healthcare Providers: IncredibleEmployment.be  This test is not yet approved or cleared by the [place] and has been authorized for detection and/or diagnosis of SARS-CoV-2 by FDA under an Emergency Use Authorization (EUA). This EUA will remain in effect (meaning this test can be used) for the duration of the COVID-19 declaration under Section 564(b)(1) of the Act, 21 U.S.C. section 360bbb-3(b)(1), unless the authorization is terminated or revoked.  Performed at [facility], [address] [place], [place] [address]          Radiology Studies: ECHO TEE  Result  Date: 08-18-2022    TRANSESOPHOGEAL ECHO REPORT   Patient Name:   [name] Date of Exam: 08-18-22 Medical Rec #:  [id number]    Height:       62.0 in Accession #:    [id number]   Weight:       102.0 lb Date of Birth:  02/13/1941   BSA:          1.436 m Patient Age:    36 years     BP:           140/75 mmHg Patient Gender: F            HR:           72 bpm. Exam Location:  [facility] Procedure: Transesophageal Echo, Cardiac Doppler, Color Doppler and Saline            Contrast Bubble Study Indications:     Acute CVA  History:         Patient has prior history of Echocardiogram examinations, most                  recent 08/10/2022. Stroke. Atypical chest pain , Breast cancer.  Sonographer:     Sherrie Sport Referring Phys:  [id number] A Benjaman Artman Diagnosing Phys: Neoma Laming PROCEDURE: The transesophogeal probe was passed without difficulty through the esophogus of the patient. Sedation performed by performing physician. The patient developed no complications during the procedure. IMPRESSIONS  1. Left ventricular ejection fraction, by estimation, is 65 to 70%. The left ventricle has normal function.  2. Right ventricular systolic function is normal. The right ventricular size is normal.  3. Left atrial size was mildly dilated. No left atrial/left atrial appendage thrombus was detected.  4. Right atrial size was mildly dilated.  5. The mitral valve is grossly normal. Trivial mitral valve regurgitation.  6. The aortic valve is calcified. Aortic valve regurgitation is trivial.  7. Agitated saline contrast bubble study was negative, with no evidence of any interatrial shunt. Conclusion(s)/Recommendation(s): No LA/LAA thrombus identified. Negative bubble study for interatrial shunt. No intracardiac source of embolism detected on this on this transesophageal echocardiogram. FINDINGS  Left Ventricle: Left ventricular ejection fraction, by estimation, is 65 to 70%. The left ventricle has normal function. The left ventricular  internal cavity size was normal in size. Right Ventricle: The right ventricular size is normal. No increase in right ventricular wall thickness. Right ventricular systolic function is normal. Left Atrium: Left atrial size was mildly dilated. No left atrial/left atrial appendage thrombus was detected. Right Atrium: Right atrial size was mildly dilated. Pericardium: There is no evidence of pericardial effusion. Mitral Valve: The mitral valve is grossly normal. Trivial mitral valve regurgitation. Tricuspid Valve: The tricuspid valve is grossly normal. Tricuspid valve regurgitation is mild. Aortic Valve: The aortic valve is calcified. Aortic valve regurgitation is trivial. Pulmonic Valve: The pulmonic valve was normal in structure. Pulmonic valve regurgitation is trivial.  Aorta: The aortic root, ascending aorta and aortic arch are all structurally normal, with no evidence of dilitation or obstruction. IAS/Shunts: No atrial level shunt detected by color flow Doppler. Agitated saline contrast was given intravenously to evaluate for intracardiac shunting. Agitated saline contrast bubble study was negative, with no evidence of any interatrial shunt. Neoma Laming Electronically signed by Neoma Laming Signature Date/Time: 08/17/2022/9:33:10 AM    Final    CT HEAD WO CONTRAST (5MM)  Result Date: 08/16/2022 CLINICAL DATA:  Neuro deficit, acute, stroke suspected. Severe headache earlier, now better. EXAM: CT HEAD WITHOUT CONTRAST TECHNIQUE: Contiguous axial images were obtained from the base of the skull through the vertex without intravenous contrast. RADIATION DOSE REDUCTION: This exam was performed according to the departmental dose-optimization program which includes automated exposure control, adjustment of the mA and/or kV according to patient size and/or use of iterative reconstruction technique. COMPARISON:  Head CT dated 08/13/2022.  Brain MRI dated 08/13/2022. FINDINGS: Brain: Mild generalized age related  parenchymal atrophy with commensurate dilatation of the ventricles and sulci. Ventricles are stable in size and configuration in the short-term interval. Ill-defined area of low-density within the posterior RIGHT frontal lobe, corresponding to the brain MRI report of 08/13/2022 which described acute early subacute infarct in this area. Similar findings in the LEFT cerebellum and LEFT temporal occipital lobe. No mass effect or midline shift. No parenchymal hemorrhage. No extra-axial hemorrhage. Old lacunar infarct again noted in the LEFT caudate. Mild chronic small vessel ischemic changes again noted within the bilateral periventricular white matter regions. Vascular: Chronic calcified atherosclerotic changes of the large vessels at the skull base. No unexpected hyperdense vessel. Skull: Normal. Negative for fracture or focal lesion. Sinuses/Orbits: No acute finding. Other: None. IMPRESSION: 1. Ill-defined areas of low-density within the posterior RIGHT frontal lobe, LEFT cerebellum and LEFT temporal-occipital lobe, corresponding to the findings of acute to early subacute infarcts reported on brain MRI of 08/13/2022. 2. No new findings compared to the recent head CT and brain MRI of 08/13/2022. No mass effect or midline shift on today's exam. No parenchymal or extra-axial hemorrhage. 3. Chronic ischemic changes in the white matter and LEFT caudate. Electronically Signed   By: Franki Cabot M.D.   On: 08/16/2022 09:20        Scheduled Meds:  ascorbic acid  250 mg Oral Daily   aspirin  81 mg Oral Daily   atorvastatin  40 mg Oral Daily   butamben-tetracaine-benzocaine       cholecalciferol  5,000 Units Oral Q breakfast   clopidogrel  75 mg Oral Daily   vitamin B-12  500 mcg Oral Daily   cycloSPORINE  1 drop Both Eyes BID   enoxaparin (LOVENOX) injection  40 mg Subcutaneous Q24H   fentaNYL       Ferrous Fumarate  1 tablet Oral Daily   gabapentin  400 mg Oral BID   lidocaine       midazolam        pantoprazole  40 mg Oral Q breakfast   polyvinyl alcohol  1 drop Both Eyes QHS   topiramate  50 mg Oral QPM   vitamin E  400 Units Oral Q breakfast   Continuous Infusions:  sodium chloride 75 mL/hr at 08/17/22 1154     LOS: 3 days    Time spent: 35 Minutes.     Elmarie Shiley, MD [facility]   If 7PM-7AM, please contact night-coverage [email or web address]  08/17/2022, 2:11 PM

## 2022-08-18 ENCOUNTER — Encounter: Payer: Self-pay | Admitting: Cardiovascular Disease

## 2022-08-18 DIAGNOSIS — I639 Cerebral infarction, unspecified: Secondary | ICD-10-CM | POA: Diagnosis not present

## 2022-08-18 LAB — CBC WITH DIFFERENTIAL/PLATELET
Abs Immature Granulocytes: 0.02 10*3/uL (ref 0.00–0.07)
Basophils Absolute: 0 10*3/uL (ref 0.0–0.1)
Basophils Relative: 1 %
Eosinophils Absolute: 0.1 10*3/uL (ref 0.0–0.5)
Eosinophils Relative: 2 %
HCT: 28.2 % — ABNORMAL LOW (ref 36.0–46.0)
Hemoglobin: 9.6 g/dL — ABNORMAL LOW (ref 12.0–15.0)
Immature Granulocytes: 0 %
Lymphocytes Relative: 23 %
Lymphs Abs: 1.3 10*3/uL (ref 0.7–4.0)
MCH: 33 pg (ref 26.0–34.0)
MCHC: 34 g/dL (ref 30.0–36.0)
MCV: 96.9 fL (ref 80.0–100.0)
Monocytes Absolute: 0.6 10*3/uL (ref 0.1–1.0)
Monocytes Relative: 11 %
Neutro Abs: 3.4 10*3/uL (ref 1.7–7.7)
Neutrophils Relative %: 63 %
Platelets: 129 10*3/uL — ABNORMAL LOW (ref 150–400)
RBC: 2.91 MIL/uL — ABNORMAL LOW (ref 3.87–5.11)
RDW: 11.6 % (ref 11.5–15.5)
WBC: 5.4 10*3/uL (ref 4.0–10.5)
nRBC: 0 % (ref 0.0–0.2)

## 2022-08-18 LAB — APTT: aPTT: 27 seconds (ref 24–36)

## 2022-08-18 LAB — PROTIME-INR
INR: 1.3 — ABNORMAL HIGH (ref 0.8–1.2)
Prothrombin Time: 15.6 seconds — ABNORMAL HIGH (ref 11.4–15.2)

## 2022-08-18 LAB — HEPARIN LEVEL (UNFRACTIONATED): Heparin Unfractionated: 0.22 IU/mL — ABNORMAL LOW (ref 0.30–0.70)

## 2022-08-18 LAB — CA 125: Cancer Antigen (CA) 125: 46.2 U/mL — ABNORMAL HIGH (ref 0.0–38.1)

## 2022-08-18 MED ORDER — ORAL CARE MOUTH RINSE: 15.0000 mL | OROMUCOSAL | Status: DC | PRN

## 2022-08-18 MED ORDER — HEPARIN (PORCINE) 25000 UT/250ML-% IV SOLN: 550.0000 [IU]/h | INTRAVENOUS | Status: DC

## 2022-08-18 MED ORDER — AMLODIPINE BESYLATE 5 MG PO TABS
5.0000 mg | ORAL_TABLET | Freq: Every day | ORAL | Status: DC
  Administered 2022-08-18 – 2022-08-20 (×3): 5 mg via ORAL
  Filled 2022-08-18 (×3): qty 1

## 2022-08-18 MED ORDER — HEPARIN (PORCINE) 25000 UT/250ML-% IV SOLN
600.0000 [IU]/h | INTRAVENOUS | Status: DC
  Administered 2022-08-18: 550 [IU]/h via INTRAVENOUS
  Filled 2022-08-18: qty 250

## 2022-08-18 NOTE — PMR Pre-admission (Signed)
PMR Admission Coordinator Pre-Admission Assessment  Patient: Elizabeth Friedman is an 81 y.o., female MRN: [id number] DOB: 1941-09-04 Height: '5\' 2"'$  (157.5 cm) Weight: 46.3 kg  Insurance Information HMO: ***    PPO: ***     PCP: ***     IPA: ***     80/20: ***     OTHER: *** PRIMARY: [employer]      Policy#: [id number]  Group# [id number]    Subscriber: Patient CM Name: ***      Phone#: ***     Fax#: *** Pre-Cert#: ***      Employer: *** Benefits:  Phone #: [phone]     Name: *** Eff. Date: ***     Deduct: ***      Out of Pocket Max: ***      Life Max: *** CIR: ***      SNF: *** Outpatient: ***     Co-Pay: *** Home Health: ***      Co-Pay: *** DME: ***     Co-Pay: *** Providers: *** SECONDARY: ***      Policy#: ***     Phone#: ***  Financial Counselor: ***      Phone#: ***  The "Data Collection Information Summary" for patients in Inpatient Rehabilitation Facilities with attached "Privacy Act [place] Records" was provided and verbally reviewed with: Patient and Family  Emergency Contact Information Contact Information     Name Relation Home Work Mobile   [place] A Son [phone]  [place] Daughter [phone]  [phone]       Current Medical History  Patient Admitting Diagnosis: CVA History of Present Illness: 81 year old female admitted to [facility] 08/13/22 with dizziness and right-sided paresthesias. History of recent cryptogenic stroke discharged on dual antiplatelet therapy. Repeat MRI brain showed multifocal acute ischemic infarcts in the anterior and posterior circulation consistent with central embolic stroke. Of note patient finished chemotherapy for stage I carcinosarcoma approximately 3 months ago.  She also finished adjuvant radiation approximately month ago. 10/22: Patient develops fine motor deficit, left hand decreased grip, left side drift , patient notice deficit at 1;15 am. Plan to continue with  aspirin and plavix for 3 weeks then aspirin alone. Plan to repeat MRI due to gait abnormalities and Left LE weakness. D-dimer which is significantly elevated at 10.42. CTA Chest negative for PE, Lower extremity venous Dopplers did not reveal any evidence of DVT. 2D echocardiogram showed LVEF 60 to 65%, normal mitral valve, normal aortic valve, normal left atrial size and no atrial level shunt with bubble study. Recurrent acute ischemic stroke in multiple vascular territories with increasing concern that etiology is hypercoagulability of malignancy given elevated D-Dimer and uptrending CA-125 and ongoing ischemic events. History of breast cancer and aggressive form of uterine cancer with uptrending markers. Therapies are recommending intensive rehab program.  Complete NIHSS TOTAL: 2  Patient's medical record from [facility] has been reviewed by the rehabilitation admission coordinator and physician.  Past Medical History  Past Medical History:  Diagnosis Date   Allergic rhinitis    Anemia    Anginal pain (HCC)    Arthritis    Atypical chest pain    Breast cancer ([place])    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[place])    Ductal carcinoma in situ (DCIS) of right breast    Fibrocystic breast disease    GERD (gastroesophageal reflux disease)    Hearing aid worn    bilateral   History of hiatal hernia    Hyperlipidemia  Lung nodule    Menopause    Migraine    Migraine    optical migraines   Motion sickness    Plantar fasciitis    Prolapse of female pelvic organs    hodge pessary   Raynaud's disease    Sciatic leg pain    Sciatica    Skin cancer of face    Stroke [facility])    Urinary retention with incomplete bladder emptying    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 No  Family History   family history includes Breast cancer in her mother; Colon cancer in her brother; Heart disease in her father.  Current  Medications  Current Facility-Administered Medications:    0.9 %  sodium chloride infusion, , Intravenous, Continuous, Regalado, Belkys A, MD, Last Rate: 75 mL/hr at 08/18/22 0433, New Bag at 08/18/22 0433   acetaminophen (TYLENOL) tablet 650 mg, 650 mg, Oral, Q4H PRN, 650 mg at 08/18/22 0917 **OR** acetaminophen (TYLENOL) 160 MG/5ML solution 650 mg, 650 mg, Per Tube, Q4H PRN **OR** acetaminophen (TYLENOL) suppository 650 mg, 650 mg, Rectal, Q4H PRN, Mansy, Jan A, MD   amLODipine (NORVASC) tablet 5 mg, 5 mg, Oral, Daily, Regalado, Belkys A, MD, 5 mg at 08/18/22 1120   ascorbic acid (VITAMIN C) tablet 250 mg, 250 mg, Oral, Daily, Mansy, Jan A, MD, 250 mg at 08/18/22 1113   aspirin chewable tablet 81 mg, 81 mg, Oral, Daily, Mansy, Jan A, MD, 81 mg at 08/18/22 1113   atorvastatin (LIPITOR) tablet 40 mg, 40 mg, Oral, Daily, Mansy, Jan A, MD, 40 mg at 08/18/22 1113   azithromycin (ZITHROMAX) tablet 500 mg, 500 mg, Oral, Once per day on Mon Wed Fri, Regalado, Belkys A, MD   cholecalciferol (VITAMIN D3) tablet 5,000 Units, 5,000 Units, Oral, Q breakfast, Mansy, Jan A, MD, 5,000 Units at 08/18/22 1115   cyanocobalamin (VITAMIN B12) tablet 500 mcg, 500 mcg, Oral, Daily, Mansy, Jan A, MD, 500 mcg at 08/18/22 1113   cycloSPORINE (RESTASIS) 0.05 % ophthalmic emulsion 1 drop, 1 drop, Both Eyes, BID, Mansy, Jan A, MD, 1 drop at 08/18/22 1115   Ferrous Fumarate (HEMOCYTE - 106 mg FE) tablet 106 mg of iron, 1 tablet, Oral, Daily, Mansy, Jan A, MD, 106 mg of iron at 08/18/22 1115   gabapentin (NEURONTIN) capsule 400 mg, 400 mg, Oral, BID, Mansy, Jan A, MD, 400 mg at 08/18/22 1116   heparin ADULT infusion 100 units/mL (25000 units/234m), 550 Units/hr, Intravenous, Continuous, CWynelle Cleveland RPH, Last Rate: 5.5 mL/hr at 08/18/22 1113, 550 Units/hr at 08/18/22 1113   hydrALAZINE (APRESOLINE) injection 10 mg, 10 mg, Intravenous, Q4H PRN, Regalado, Belkys A, MD   labetalol (NORMODYNE) injection 5 mg, 5 mg,  Intravenous, Q2H PRN, Regalado, Belkys A, MD   ondansetron (ZOFRAN) injection 4 mg, 4 mg, Intravenous, Q4H PRN, Mansy, Jan A, MD   Oral care mouth rinse, 15 mL, Mouth Rinse, PRN, Regalado, Belkys A, MD   pantoprazole (PROTONIX) EC tablet 40 mg, 40 mg, Oral, Q breakfast, Mansy, Jan A, MD, 40 mg at 08/18/22 1113   polyvinyl alcohol (LIQUIFILM TEARS) 1.4 % ophthalmic solution 1 drop, 1 drop, Both Eyes, QHS, Mansy, Jan A, MD, 1 drop at 08/17/22 2112   senna-docusate (Senokot-S) tablet 1 tablet, 1 tablet, Oral, QHS PRN, Mansy, Jan A, MD   topiramate (TOPAMAX) tablet 50 mg, 50 mg, Oral, QPM, Mansy,  Jan A, MD, 50 mg at 08/17/22 1635   traZODone (DESYREL) tablet 25 mg, 25 mg, Oral, QHS PRN, Mansy, Arvella Merles, MD   triamcinolone (NASACORT) nasal inhaler 2 spray, 2 spray, Each Nare, Daily PRN, Mansy, Arvella Merles, MD   Vitamin E CAPS 400 Units, 400 Units, Oral, Q breakfast, Mansy, Jan A, MD, 400 Units at 08/18/22 1115  Patients Current Diet:  Diet Order             Diet Heart Room service appropriate? Yes; Fluid consistency: Thin  Diet effective now                   Precautions / Restrictions Precautions Precautions: Fall Precaution Comments: Also, pt needs cues to attend to LUE during transfers/mobility. Restrictions Weight Bearing Restrictions: No   Has the patient had 2 or more falls or a fall with injury in the past year? No  Prior Activity Level    Prior Functional Level Self Care: Did the patient need help bathing, dressing, using the toilet or eating? Needed some help  Indoor Mobility: Did the patient need assistance with walking from room to room (with or without device)? Independent  Stairs: Did the patient need assistance with internal or external stairs (with or without device)? Needed some help  Functional Cognition: Did the patient need help planning regular tasks such as shopping or remembering to take medications? Independent  Patient Information Are you of Hispanic,  Latino/a,or Spanish origin?: A. No, not of Hispanic, Latino/a, or Spanish origin What is your race?: A. White Do you need or want an interpreter to communicate with a doctor or health care staff?: 0. 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 Walker  Current Functional Level Cognition  Overall Cognitive Status: Within Functional Limits for tasks assessed Orientation Level: Oriented X4 General Comments: Pt appears to remember OT from yesterday's session. Follows cues with direction.    Extremity Assessment (includes Sensation/Coordination)  Upper Extremity Assessment: LUE deficits/detail LUE Deficits / Details: Continued LUE proprioception deficits in L hand/fingers; appears intact at wrist and elbow. Sensation appears inact. Significant LUE coordination deficits in [facility] and [place]. Delayed response in LUE. Moderate L neglect of LUE. LUE:  (No shoulder issues noted today) LUE Sensation: decreased proprioception LUE Coordination: decreased fine motor, decreased gross motor  Lower Extremity Assessment: Defer to PT evaluation, LLE deficits/detail    ADLs  Overall ADL's : Needs assistance/impaired Grooming: Oral  care, Cueing for compensatory techniques, Minimal assistance, Standing (MIN A for standing balance) Grooming Details (indicate cue type and reason): Pt requiring MIN A for standing balance at sink, with cues for how to use LUE as an assist (as pt typically uses LUE for unscrewing toothpaste top) during toothpaste opening, toothpaste on toothbrush, and  then brushing teeth (with RUE); OT cues for pt to place L hand on countertop for support while brushing. Overall standing x approx 8 minutes at sink. Intermittent cues for standing up tall (pt leaning to the L). Pt able to wash face with washcloth with MIN A standing balance. Lower Body Dressing: Moderate assistance Toilet Transfer: Minimal assistance Toilet Transfer Details (indicate cue type and reason): MIN A with RW for balance; OT cues for LUE attention and movement of BIL hands for sit to stand and stand to sit. Toileting- Clothing Manipulation and Hygiene: Set up, Moderate assistance (MOD A clothing management from standing at RW; set up for hygiene from sitting using RUE. Pt is right-handed.) Functional mobility during ADLs: Min guard, Rolling walker (2 wheels) (approx 15' 2 attempts in room) General ADL Comments: Decreased in ADL function since recent LUE deficits extended.    Mobility  Overal bed mobility: Needs Assistance Bed Mobility: Supine to Sit, Sit to Supine Supine to sit: Min guard (cues for LUE management) Sit to supine: Supervision General bed mobility comments: Needs cues for LUE management.    Transfers  Overall transfer level: Needs assistance Equipment used: None (Stood and took one step forward from EOB to the sink/countertop.) Transfers: Sit to/from Stand Sit to Stand: Min assist (handheld in LUE and cues for using RUE to assist by pushing from bed.) General transfer comment: Cues and extra time for placing L hand on RW; CGA on L hand to maintain position; cue for pushing up from bed with RUE rather than placing R hand on  RW also.    Ambulation / Gait / Stairs / Wheelchair Mobility  Ambulation/Gait Ambulation/Gait assistance: Mod assist, Min assist Gait Distance (Feet): 80 Feet Assistive device: Rolling walker (2 wheels) Gait Pattern/deviations: Ataxic, Narrow base of support, Step-through pattern General Gait Details: ataxic gait with crossing midling and buckling noted in B knees. Needs increased cues/sequencing for guidance of RW. Tends to keep too far away from body. Follows commands well. Gait velocity: slightly decreased General stair comments: will attempt at later session if possible, but has railing on the R at home if necessary.    Posture / Balance Balance Overall balance assessment: Needs assistance Sitting-balance support: Feet supported Sitting balance-Leahy Scale: Good Standing balance support: Bilateral upper extremity supported Standing balance-Leahy Scale: Fair    Special needs/care consideration Skin intact   Previous Home Environment (from acute therapy documentation) Living Arrangements: Alone Available Help at Discharge: Family, Available 24 hours/day Type of Home: House Home Layout: One level Home Access: Stairs to enter Entrance Stairs-Rails: Right Entrance Stairs-Number of Steps: 2-3 steps on the side with pull bar Bathroom Shower/Tub: Tub/shower unit, Multimedia programmer: Standard Bathroom Accessibility: Yes How Accessible: Accessible via walker [place]: No  Discharge Living Setting Plans for Discharge Living Setting: House Does the patient have any problems obtaining your medications?: No  Social/Family/Support Systems Anticipated Caregiver: son, [name] Anticipated Caregiver's Contact Information: [phone] Discharge Plan Discussed with Primary Caregiver: Yes Is Caregiver In Agreement with Plan?: Yes Does Caregiver/Family have Issues with Lodging/Transportation while Pt is in Rehab?: No  Goals Patient/Family Goal for Rehab: Mod I/min A,  PT, OT, independent with SLP Expected length of stay: 12-14 days Pt/Family Agrees to Admission and willing to participate: Yes Program Orientation Provided & Reviewed with Pt/Caregiver Including Roles  & Responsibilities: Yes  Barriers to Discharge: Insurance for SNF coverage  Decrease burden of Care through IP rehab admission: Othern/a  Possible need for SNF placement upon  discharge: not anticipated  Patient Condition: I have reviewed medical records from [facility], spoken with  TOC , and patient and spouse. I discussed via phone for inpatient rehabilitation assessment.  Patient will benefit from ongoing PT, OT, and SLP, can actively participate in 3 hours of therapy a day 5 days of the week, and can make measurable gains during the admission.  Patient will also benefit from the coordinated team approach during an Inpatient Acute Rehabilitation admission.  The patient will receive intensive therapy as well as Rehabilitation physician, nursing, social worker, and care management interventions.  Due to safety, skin/wound care, disease management, medication administration, pain management, and patient education the patient requires 24 hour a day rehabilitation nursing.  The patient is currently *** with mobility and basic ADLs.  Discharge setting and therapy post discharge at home with home health is anticipated.  Patient has agreed to participate in the Acute Inpatient Rehabilitation Program and will admit ***.  Preadmission Screen Completed By:  Nelly Laurence, 08/18/2022 12:11 PM ______________________________________________________________________   Discussed status with Dr. Marland Kitchen on *** at *** and received approval for admission today.  Admission Coordinator:  Nelly Laurence, time ***/Date ***   Assessment/Plan: Diagnosis: Does the need for close, 24 hr/day Medical supervision in concert with the patient's rehab needs make it unreasonable for this patient to be served in a less intensive  setting? {yes_no_potentially:3041433} Co-Morbidities requiring supervision/potential complications: *** Due to {due MW:4132440}, does the patient require 24 hr/day rehab nursing? {yes_no_potentially:3041433} Does the patient require coordinated care of a physician, rehab nurse, PT, OT, and SLP to address physical and functional deficits in the context of the above medical diagnosis(es)? {yes_no_potentially:3041433} Addressing deficits in the following areas: {deficits:3041436} Can the patient actively participate in an intensive therapy program of at least 3 hrs of therapy 5 days a week? {yes_no_potentially:3041433} The potential for patient to make measurable gains while on inpatient rehab is {potential:3041437} Anticipated functional outcomes upon discharge from inpatient rehab: {functional outcomes:304600100} PT, {functional outcomes:304600100} OT, {functional outcomes:304600100} SLP Estimated rehab length of stay to reach the above functional goals is: *** Anticipated discharge destination: {anticipated dc setting:21604} 10. Overall Rehab/Functional Prognosis: {potential:3041437}   MD Signature: ***

## 2022-08-18 NOTE — TOC Initial Note (Signed)
Transition of Care [facility]) - Initial/Assessment Note    Patient Details  Name: Elizabeth Friedman MRN: [id number] Date of Birth: [birth year]  [facility] CM/SW Contact:    Colen Darling, [place] Phone Number: 08/18/2022, 4:40 PM  Clinical Narrative:                  SW met with the patient and family. Patient sees PCP Dr. Beatris Si. Pharmacy is [facility] in [place]. The patient picks up medication in person. No current home health. Patient has a cane.  The family is interested in an inpatient rehab referral to [facility].       Patient Goals and CMS Choice Patient states their goals for this hospitalization and ongoing recovery are:: attend rehab CMS Medicare.gov Compare Post Acute Care list provided to:: Patient Represenative (must comment) (Patient is requesting [facility]) Choice offered to / list presented to : Adult Children, Patient  Expected Discharge Plan and Services     Discharge Planning Services: CM Consult   Living arrangements for the past 2 months: [place]                                      Prior Living Arrangements/Services Living arrangements for the past 2 months: Single Family Home Lives with:: Self Patient language and need for interpreter reviewed:: No Do you feel safe going back to the place where you live?: Yes      Need for Family Participation in Patient Care: Yes (Comment) Care giver support system in place?: Yes (comment) Current home services: DME (walker)    Activities of Daily Living Home Assistive Devices/Equipment: Hearing aid ADL Screening (condition at time of admission) Patient's cognitive ability adequate to safely complete daily activities?: Yes Is the patient deaf or have difficulty hearing?: Yes Does the patient have difficulty seeing, even when wearing glasses/contacts?: No Does the patient have difficulty concentrating, remembering, or making decisions?: Yes Patient  able to express need for assistance with ADLs?: Yes Does the patient have difficulty dressing or bathing?: Yes Independently performs ADLs?: No Communication: Independent with device (comment) Dressing (OT): Independent Grooming: Independent Feeding: Independent Bathing: Needs assistance Is this a change from baseline?: Change from baseline, expected to last <3 days Toileting: Needs assistance Is this a change from baseline?: Change from baseline, expected to last <3 days In/Out Bed: Needs assistance Is this a change from baseline?: Change from baseline, expected to last <3 days Walks in Home: Needs assistance Is this a change from baseline?: Change from baseline, expected to last <3 days Does the patient have difficulty walking or climbing stairs?: Yes Weakness of Legs: Both Weakness of Arms/Hands: None  Permission Sought/Granted Permission sought to share information with : Case Manager, Family Supports                Emotional Assessment Appearance:: Appears stated age Attitude/Demeanor/Rapport: Engaged, Ecologist, Gracious   Orientation: : Oriented to Self, Oriented to Place, Oriented to  Time, Oriented to Situation      Admission diagnosis:  Dizziness [R42] CVA (cerebral vascular accident) ([place]) [I63.9] Acute CVA (cerebrovascular accident) [facility]) [I63.9] Patient Active Problem List   Diagnosis Date Noted   Acute CVA (cerebrovascular accident) ([place]) 08/14/2022   Dyslipidemia 08/14/2022   GERD without esophagitis 08/14/2022   Migraine 08/14/2022   CVA (cerebral vascular accident) ([place]) 08/14/2022   Paresthesias    Elevated  troponin I measurement 08/08/2022   H/O: GI bleed 08/08/2022   Dizziness 08/07/2022   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[place]) 01/23/2022   Postoperative state 12/24/2021   Endometrial cancer ([place])    Bronchiectasis without complication ([place]) 11/05/347   Lung granuloma ([place]) 12/10/2021   B12 deficiency 09/25/2021   History of ductal carcinoma  in situ (DCIS) of breast 09/10/2020   Iron deficiency anemia 09/10/2020   Ductal carcinoma in situ (DCIS) of right breast 05/17/2020   Bilateral carotid artery stenosis 02/06/2020   Visual changes 01/12/2020   Carotid stenosis 01/11/2020   Migraine aura without headache 03/07/2019   Complete tear of left rotator cuff 03/10/2017   Pessary maintenance 12/18/2016   Rectocele 12/18/2016   Midline cystocele 12/18/2016   Menopausal syndrome on hormone replacement therapy 12/18/2016   Allergic rhinitis 12/17/2015   Atypical chest pain 12/17/2015   Bloodgood disease 12/17/2015   Acid reflux 12/17/2015   HLD (hyperlipidemia) 12/17/2015   Lung mass 12/17/2015   Uterine prolapse 12/17/2015   CA of skin 12/17/2015   Post menopausal syndrome 01/14/2015   Incomplete bladder emptying 01/14/2015   Neuralgia neuritis, sciatic nerve 01/14/2015   H/O malignant neoplasm of skin 09/17/2014   PCP:  Adin Hector, MD Pharmacy:   [facility] [address]), [place] [address] [place]) [place] [address] Phone: [phone] Fax: [phone]     Social Determinants of Health ([place]) Interventions    Readmission Risk Interventions    08/18/2022    4:24 PM  Readmission Risk Prevention Plan  Transportation Screening Complete  PCP or Specialist Appt within 3-5 Days Complete  HRI or [place] Complete  Social Work Consult for [place] Planning/Counseling Complete  Palliative Care Screening Complete  Medication Review Press photographer) Complete

## 2022-08-18 NOTE — Progress Notes (Signed)
Physical Therapy Treatment Patient Details Name: Elizabeth Friedman MRN: [id number] DOB: 1940/12/15 Today's Date: 08/18/2022   History of Present Illness Pt is an 81 year old female admitted  with recurrent subacute to acute CVA, concerning for central involvement source after presenting to ED with dizziness and right-sided paresthesias; PMH significant for past history of breast and uterine cancer, recurrent CVA, dyslipidemia, GERD, Osteoarthritis, vitamin B12 deficiency and migraine. 10/24 update: Pt now with confirmed extension of CVA, new LUE deficits and L visual field deficits, new balance deficits.    PT Comments    Pt is making good progress towards goals and is motivated to participate. Improved balance during mobility with slightly improved BOS, however still demonstrates L neglect with frequent reminders required. Improved safety awareness with ambulation to bathroom, encouraging staff to ambulate room distances during daytime hours. There-ex performed in addition to dual tasking. Will continue to progress. Great candidate for CIR.   Recommendations for follow up therapy are one component of a multi-disciplinary discharge planning process, led by the attending physician.  Recommendations may be updated based on patient status, additional functional criteria and insurance authorization.  Follow Up Recommendations  Acute inpatient rehab (3hours/day) Can patient physically be transported by private vehicle: Yes   Assistance Recommended at Discharge Intermittent Supervision/Assistance  Patient can return home with the following A lot of help with walking and/or transfers;A lot of help with bathing/dressing/bathroom;Assist for transportation;Help with stairs or ramp for entrance   Equipment Recommendations   (TBD)    Recommendations for Other Services       Precautions / Restrictions Precautions Precautions: Fall Restrictions Weight Bearing Restrictions: No     Mobility  Bed  Mobility Overal bed mobility: Needs Assistance Bed Mobility: Supine to Sit, Sit to Supine     Supine to sit: Min guard Sit to supine: Supervision   General bed mobility comments: cues for L UE. Able to exit L side of bed. Once seated at EOB, upright posture noted    Transfers Overall transfer level: Needs assistance Equipment used: Rolling walker (2 wheels) Transfers: Sit to/from Stand Sit to Stand: Min assist           General transfer comment: cues for foot placement prior to standing. RW used. Once standing, cues for weight shifting and weight acceptance on L LE.    Ambulation/Gait Ambulation/Gait assistance: Mod assist, Min assist Gait Distance (Feet): 40 Feet Assistive device: Rolling walker (2 wheels) Gait Pattern/deviations: Ataxic, Narrow base of support, Step-through pattern       General Gait Details: improved ataxic gait with reciprocal gait pattern, however still needs cues for RW sequencing as she tends to veer to the L   Stairs             Wheelchair Mobility    Modified Rankin (Stroke Patients Only)       Balance Overall balance assessment: Needs assistance Sitting-balance support: Feet supported Sitting balance-Leahy Scale: Good     Standing balance support: Bilateral upper extremity supported Standing balance-Leahy Scale: Fair                              Cognition Arousal/Alertness: Awake/alert Behavior During Therapy: WFL for tasks assessed/performed Overall Cognitive Status: Within Functional Limits for tasks assessed                                 General  Comments: good carryover from previous session        Exercises Other Exercises Other Exercises: Standing ther-ex performed including heel raises, alt marching (cues for L knee extension) and seated LAQ, 1 additional set with UE weight for dual tasking. Other Exercises: Ambulated to bathroom to void. Cues for sequencing and using railing for  transfers. Once at sink, pt able to perform hand hygiene with B hands. Other Exercises: educated on family visits on L to simulate L head turns    General Comments        Pertinent Vitals/Pain Pain Assessment Pain Assessment: 0-10 Pain Score: 2  Pain Location: headache Pain Descriptors / Indicators: Aching Pain Intervention(s): Limited activity within patient's tolerance    Home Living                          Prior Function            PT Goals (current goals can now be found in the care plan section) Acute Rehab PT Goals Patient Stated Goal: get back home PT Goal Formulation: With patient Time For Goal Achievement: 08/28/22 Potential to Achieve Goals: Good Progress towards PT goals: Progressing toward goals    Frequency    7X/week      PT Plan Current plan remains appropriate    Co-evaluation              AM-PAC PT "6 Clicks" Mobility   Outcome Measure  Help needed turning from your back to your side while in a flat bed without using bedrails?: A Little Help needed moving from lying on your back to sitting on the side of a flat bed without using bedrails?: A Little Help needed moving to and from a bed to a chair (including a wheelchair)?: A Little Help needed standing up from a chair using your arms (e.g., wheelchair or bedside chair)?: A Lot Help needed to walk in hospital room?: A Lot Help needed climbing 3-5 steps with a railing? : Total 6 Click Score: 14    End of Session Equipment Utilized During Treatment: Gait belt Activity Tolerance: Patient tolerated treatment well Patient left: in chair;with family/visitor present Nurse Communication: Mobility status PT Visit Diagnosis: Muscle weakness (generalized) (M62.81);Ataxic gait (R26.0);Difficulty in walking, not elsewhere classified (R26.2);Unsteadiness on feet (R26.81)     Time: 1025-8527 PT Time Calculation (min) (ACUTE ONLY): 33 min  Charges:  $Gait Training: 8-22  mins $Therapeutic Exercise: 8-22 mins                     Greggory Stallion, PT, DPT, GCS [phone]    Dallana Mavity 08/18/2022, 4:48 PM

## 2022-08-18 NOTE — Progress Notes (Signed)
Elizabeth Friedman   DOB:09/09/1941   OE#:[id number]    Subjective: Patient had a CT scan of the chest-to rule out PE yesterday.  Negative.  Also had MRI brain.  Patient continues have intermittent weakness of her right upper extremity weakness.  Having difficulty with gait.  Patient accompanied by her son.  Objective:  Vitals:   08/18/22 0430 08/18/22 1122  BP: (!) 154/77 (!) 151/74  Pulse: 66 66  Resp: 16 14  Temp: 98.9 F (37.2 C) 98.4 F (36.9 C)  SpO2: 97% 99%     Intake/Output Summary (Last 24 hours) at 08/18/2022 1429 Last data filed at 08/18/2022 0933 Gross per 24 hour  Intake 240 ml  Output 350 ml  Net -110 ml   Patient noted to have left upper extremity weakness/hand.  Loss of left-sided nasolabial fold  Physical Exam Vitals and nursing note reviewed.  HENT:     Head: Normocephalic and atraumatic.     Mouth/Throat:     Pharynx: Oropharynx is clear.  Eyes:     Extraocular Movements: Extraocular movements intact.     Pupils: Pupils are equal, round, and reactive to light.  Cardiovascular:     Rate and Rhythm: Normal rate and regular rhythm.  Pulmonary:     Comments: Decreased breath sounds bilaterally.  Abdominal:     Palpations: Abdomen is soft.  Musculoskeletal:        General: Normal range of motion.     Cervical back: Normal range of motion.  Skin:    General: Skin is warm.  Neurological:     Mental Status: She is alert and oriented to person, place, and time.  Psychiatric:        Behavior: Behavior normal.        Judgment: Judgment normal.      Labs:  Lab Results  Component Value Date   WBC 5.4 08/18/2022   HGB 9.6 (L) 08/18/2022   HCT 28.2 (L) 08/18/2022   MCV 96.9 08/18/2022   PLT 129 (L) 08/18/2022   NEUTROABS 3.4 08/18/2022    Lab Results  Component Value Date   NA 138 08/16/2022   K 3.6 08/16/2022   CL 111 08/16/2022   CO2 23 08/16/2022    Studies:  CT Angio Chest Pulmonary Embolism (PE) W or WO Contrast  Result Date:  08/17/2022 CLINICAL DATA:  Pulmonary embolism (PE) suspected, high prob EXAM: CT ANGIOGRAPHY CHEST WITH CONTRAST TECHNIQUE: Multidetector CT imaging of the chest was performed using the standard protocol during bolus administration of intravenous contrast. Multiplanar CT image reconstructions and MIPs were obtained to evaluate the vascular anatomy. RADIATION DOSE REDUCTION: This exam was performed according to the departmental dose-optimization program which includes automated exposure control, adjustment of the mA and/or kV according to patient size and/or use of iterative reconstruction technique. CONTRAST:  25m OMNIPAQUE IOHEXOL 350 MG/ML SOLN COMPARISON:  Chest XR, 08/08/2022. CT chest, 08/07/2022. NM V/Q scan, 08/08/2022 FINDINGS: Cardiovascular: Satisfactory opacification of the pulmonary arteries to the segmental level. No evidence of pulmonary embolism. Normal heart size. No pericardial effusion. Mediastinum/Nodes: No enlarged mediastinal, hilar, or axillary lymph nodes. Thyroid gland, trachea, and esophagus demonstrate no significant findings. Lungs/Pleura: No focal consolidation or mass. Similar distribution and appearance of tree-in-bud nodularities, greatest within the RIGHT lung base. LEFT basilar calcified granuloma. No pleural effusion or pneumothorax. Upper Abdomen: No acute abnormality. Punctate calcified granulomas within liver and spleen, likely sequelae of prior granulomatous disease. Musculoskeletal: No chest wall abnormality. No acute or significant osseous findings.  Review of the MIP images confirms the above findings. IMPRESSION: 1.  No segmental or larger pulmonary embolus. 2. Similar distribution and appearance of tree-in-bud nodularities, greatest within the RIGHT lung base. Findings are likely to reflect an infectious or inflammatory process. 3. Punctate calcified granulomas within chest and abdomen, likely sequelae of prior granulomatous disease. Electronically Signed   By: Michaelle Birks M.D.   On: 08/17/2022 16:42   MR CERVICAL SPINE W WO CONTRAST  Result Date: 08/17/2022 CLINICAL DATA:  Ataxia.  Left-sided weakness.  Stroke. EXAM: MRI CERVICAL SPINE WITHOUT AND WITH CONTRAST TECHNIQUE: Multiplanar and multiecho pulse sequences of the cervical spine, to include the craniocervical junction and cervicothoracic junction, were obtained without and with intravenous contrast. CONTRAST:  20m GADAVIST GADOBUTROL 1 MMOL/ML IV SOLN COMPARISON:  None Available. FINDINGS: Alignment: Normal Vertebrae: No fracture or focal lesion. Cord: No cord insult.  No cord compression. Posterior Fossa, vertebral arteries, paraspinal tissues: See results of brain MRI. Disc levels: The foramen magnum is widely patent. There is ordinary mild osteoarthritis of the C1-2 articulation but no encroachment upon the neural structures. C2-3: Facet osteoarthritis left more than right. No compressive stenosis. C3-4: Bulging of the disc. Facet osteoarthritis left more than right. Moderate left foraminal narrowing. C4-5: Spondylosis with endplate osteophytes and bulging of the disc. No cord compression. Moderate bilateral foraminal narrowing. C5-6: Spondylosis with endplate osteophytes and bulging of the disc. No cord compression. Moderate bilateral foraminal narrowing. C6-7: Small right paracentral disc protrusion. No neural compression. C7-T1: Mild facet osteoarthritis.  No stenosis. IMPRESSION: 1. No cord insult. 2. Degenerative spondylosis from C3-4 through C5-6. No cord compression. Foraminal narrowing that could possibly be symptomatic on the left at C3-4 and bilateral at C4-5 and C5-6. Electronically Signed   By: MNelson ChimesM.D.   On: 08/17/2022 14:13   MR BRAIN W WO CONTRAST  Result Date: 08/17/2022 CLINICAL DATA:  Follow-up stroke.  Right-sided weakness. EXAM: MRI HEAD WITHOUT AND WITH CONTRAST TECHNIQUE: Multiplanar, multiecho pulse sequences of the brain and surrounding structures were obtained without and  with intravenous contrast. CONTRAST:  567mGADAVIST GADOBUTROL 1 MMOL/ML IV SOLN COMPARISON:  Head CT yesterday.  MRI 08/13/2022. FINDINGS: Brain: Small areas of acute infarction previously seen in the left cerebellum are normalizing on diffusion imaging. No new cerebellar or brainstem insult. There has been extension of the region of infarction in the right hemisphere, now seen affecting the deep insula and more of the frontoparietal cortical and subcortical brain. This is consistent with progressive right MCA branch vessel infarction. Newly seen subcentimeter focus of acute infarction in the left frontal white matter. Normalization of diffusion at the location of punctate acute infarctions previously seen in the left posterior temporal lobe. No evidence of hemorrhage or mass effect. Elsewhere, chronic ischemic changes of the pons, thalami, basal ganglia and hemispheric white matter are stable. No hydrocephalus. No extra-axial collection. After contrast administration, some foci of enhancement are notable in the regions of infarction in the left cerebellum and right frontoparietal region. Vascular: Major vessels at the base of the brain show flow. Skull and upper cervical spine: Negative Sinuses/Orbits: Clear/normal Other: None IMPRESSION: 1. Interval extension of infarction in the right hemisphere, now seen affecting the deep insula and more of the frontoparietal cortical and subcortical brain. This is consistent with progressive right MCA branch vessel infarction. No evidence of hemorrhage or mass effect. 2. Newly seen subcentimeter focus of acute infarction in the left frontal white matter. 3. Some normalization of diffusion at  the location of acute infarctions previously seen in the left posterior temporal lobe and left cerebellum. 4. Chronic small-vessel ischemic changes elsewhere throughout the brain as outlined above. Electronically Signed   By: Nelson Chimes M.D.   On: 08/17/2022 14:10   ECHO TEE  Result  Date: 08/17/2022    TRANSESOPHOGEAL ECHO REPORT   Patient Name:   Elizabeth Friedman Date of Exam: 08/17/2022 Medical Rec #:  [id number]    Height:       62.0 in Accession #:    [id number]   Weight:       102.0 lb Date of Birth:  08/17/1941   BSA:          1.436 m Patient Age:    81 years     BP:           140/75 mmHg Patient Gender: F            HR:           72 bpm. Exam Location:  [facility] Procedure: Transesophageal Echo, Cardiac Doppler, Color Doppler and Saline            Contrast Bubble Study Indications:     Acute CVA  History:         Patient has prior history of Echocardiogram examinations, most                  recent 08/10/2022. Stroke. Atypical chest pain , Breast cancer.  Sonographer:     Sherrie Sport Referring Phys:  [id number] [facility] A [place]O Diagnosing Phys: Neoma Laming PROCEDURE: The transesophogeal probe was passed without difficulty through the esophogus of the patient. Sedation performed by performing physician. The patient developed no complications during the procedure. IMPRESSIONS  1. Left ventricular ejection fraction, by estimation, is 65 to 70%. The left ventricle has normal function.  2. Right ventricular systolic function is normal. The right ventricular size is normal.  3. Left atrial size was mildly dilated. No left atrial/left atrial appendage thrombus was detected.  4. Right atrial size was mildly dilated.  5. The mitral valve is grossly normal. Trivial mitral valve regurgitation.  6. The aortic valve is calcified. Aortic valve regurgitation is trivial.  7. Agitated saline contrast bubble study was negative, with no evidence of any interatrial shunt. Conclusion(s)/Recommendation(s): No LA/LAA thrombus identified. Negative bubble study for interatrial shunt. No intracardiac source of embolism detected on this on this transesophageal echocardiogram. FINDINGS  Left Ventricle: Left ventricular ejection fraction, by estimation, is 65 to 70%. The left ventricle has normal function. The left ventricular  internal cavity size was normal in size. Right Ventricle: The right ventricular size is normal. No increase in right ventricular wall thickness. Right ventricular systolic function is normal. Left Atrium: Left atrial size was mildly dilated. No left atrial/left atrial appendage thrombus was detected. Right Atrium: Right atrial size was mildly dilated. Pericardium: There is no evidence of pericardial effusion. Mitral Valve: The mitral valve is grossly normal. Trivial mitral valve regurgitation. Tricuspid Valve: The tricuspid valve is grossly normal. Tricuspid valve regurgitation is mild. Aortic Valve: The aortic valve is calcified. Aortic valve regurgitation is trivial. Pulmonic Valve: The pulmonic valve was normal in structure. Pulmonic valve regurgitation is trivial. Aorta: The aortic root, ascending aorta and aortic arch are all structurally normal, with no evidence of dilitation or obstruction. IAS/Shunts: No atrial level shunt detected by color flow Doppler. Agitated saline contrast was given intravenously to evaluate for intracardiac shunting. Agitated saline contrast bubble study was  negative, with no evidence of any interatrial shunt. Neoma Laming Electronically signed by Neoma Laming Signature Date/Time: 08/17/2022/9:33:10 AM    Final     81 year old woman with a history of uterine cancer presents to the hospital for recurrent acute stroke suggestive of embolic etiology.   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  CT scan abdomen pelvis 2023-mild peritoneal thickening noted however no convincing evidence of recurrent disease.  CA 125 slightly elevated.  Repeat Ca1 2 5 again slightly elevated.  Discussed regarding follow-up PET scan outpatient-although clinically less likely to be informative with low-volume disease.  Defer to primary oncologist, Dr.Rao-outpatient.   #Embolic stroke recurrent-the etiology is unclear cardiac versus hypercoagulable  state-primary vs. Secondary [history of malignancy, recent chemotherapy age].  No obvious etiology noted on hypercoagulable work-up; some testing pending.  However malignancy is always a risk factor for blood clots.  As per neurology plan to start on IV heparin no bolus.  If clinically stable, when indicated by neurology-patient will be transition to Eliquis 5 mg twice daily for treatment of stroke.  Discussed with Dr.Bhagat.   The above plan of care was discussed with the patient and her son by the bedside.   Cammie Sickle, MD 08/18/2022  2:29 PM

## 2022-08-18 NOTE — Progress Notes (Signed)
Neurology Progress Note   S:// No new neuro complaints from patient/family initially During our conversation begins to complain of left sided headache 5/10 -- not one she gets frequently but "a regular headache, the kind I'd take two tylenol for" She does have some light sensitivity but no worsening of headache or change with laying down.   O:// Current vital signs: BP (!) 154/77   Pulse 66   Temp 98.9 F (37.2 C)   Resp 16   Ht '5\' 2"'$  (1.575 m)   Wt 46.3 kg   SpO2 97%   BMI 18.66 kg/m  Vital signs in last 24 hours: Temp:  [98.1 F (36.7 C)-98.9 F (37.2 C)] 98.9 F (37.2 C) (10/24 0430) Pulse Rate:  [66-109] 66 (10/24 0430) Resp:  [13-25] 16 (10/24 0430) BP: (145-176)/(76-87) 154/77 (10/24 0430) SpO2:  [95 %-100 %] 97 % (10/24 0430) Gen: Awake alert in no distress initially, then moderate headache HEENT: Normocephalic atraumatic Lungs: Clear Cardiovascular: Regular rate rhythm Abdomen nondistended nontender Extremities warm well perfused without any evidence of edema Neurological exam She is awake alert oriented but sometimes slow to respond There is some mild dysarthria  No evidence of aphasia-able to provide clear history. No neglect to DSS but does have a slightly preference to look to the right  Some left NLF flattening. Saccadic pursuits, mild difficulty with LLQ finger counting (detects fingers but misreports the number). PERRL, tongue midline, sensation intact bilaterally in the face Stable motor examination is notable for 4- to 5 strength throughout the LUE with coaching, flexion a little stronger than extension worse distally than proximally. However she does have pronator drift of the LUE today which is worse than yesterday and there is slight drift of the LLE with 4/5 hip flexion compared to 5/5 on the right and some subtle weakness in an upper motor neuron pattern in the LLE flexors proximal more than distal  Sensation is intact to light touch/temperature  throughout all four extremities  Coordination exam-no dysmetria (not out of proporption to weakness) Gait testing was deferred at this time NIH stroke scale- 3 for left arm and leg drift and LLQ partial hemianopia (sensory resolved, neglect not significant enough to score on NIH)  Medications  Current Facility-Administered Medications:    0.9 %  sodium chloride infusion, , Intravenous, Continuous, Regalado, Belkys A, MD, Last Rate: 75 mL/hr at 08/18/22 0433, New Bag at 08/18/22 0433   acetaminophen (TYLENOL) tablet 650 mg, 650 mg, Oral, Q4H PRN, 650 mg at 08/17/22 1639 **OR** acetaminophen (TYLENOL) 160 MG/5ML solution 650 mg, 650 mg, Per Tube, Q4H PRN **OR** acetaminophen (TYLENOL) suppository 650 mg, 650 mg, Rectal, Q4H PRN, Mansy, Jan A, MD   ascorbic acid (VITAMIN C) tablet 250 mg, 250 mg, Oral, Daily, Mansy, Jan A, MD, 250 mg at 08/17/22 1245   aspirin chewable tablet 81 mg, 81 mg, Oral, Daily, Mansy, Jan A, MD, 81 mg at 08/17/22 1248   atorvastatin (LIPITOR) tablet 40 mg, 40 mg, Oral, Daily, Mansy, Jan A, MD, 40 mg at 08/17/22 1247   azithromycin (ZITHROMAX) tablet 500 mg, 500 mg, Oral, Once per day on Mon Wed Fri, Regalado, Belkys A, MD   cholecalciferol (VITAMIN D3) tablet 5,000 Units, 5,000 Units, Oral, Q breakfast, Mansy, Jan A, MD, 5,000 Units at 08/17/22 1248   clopidogrel (PLAVIX) tablet 75 mg, 75 mg, Oral, Daily, Mansy, Jan A, MD, 75 mg at 08/17/22 1246   cyanocobalamin (VITAMIN B12) tablet 500 mcg, 500 mcg, Oral, Daily, Mansy, Jan A,  MD, 500 mcg at 08/17/22 1247   cycloSPORINE (RESTASIS) 0.05 % ophthalmic emulsion 1 drop, 1 drop, Both Eyes, BID, Mansy, Jan A, MD, 1 drop at 08/17/22 2112   enoxaparin (LOVENOX) injection 40 mg, 40 mg, Subcutaneous, Q24H, Mansy, Jan A, MD, 40 mg at 08/17/22 1249   Ferrous Fumarate (HEMOCYTE - 106 mg FE) tablet 106 mg of iron, 1 tablet, Oral, Daily, Mansy, Jan A, MD, 106 mg of iron at 08/16/22 1011   gabapentin (NEURONTIN) capsule 400 mg, 400 mg, Oral,  BID, Mansy, Jan A, MD, 400 mg at 08/17/22 2112   hydrALAZINE (APRESOLINE) injection 10 mg, 10 mg, Intravenous, Q4H PRN, Regalado, Belkys A, MD   labetalol (NORMODYNE) injection 5 mg, 5 mg, Intravenous, Q2H PRN, Regalado, Belkys A, MD   ondansetron (ZOFRAN) injection 4 mg, 4 mg, Intravenous, Q4H PRN, Mansy, Arvella Merles, MD   Oral care mouth rinse, 15 mL, Mouth Rinse, PRN, Regalado, Belkys A, MD   pantoprazole (PROTONIX) EC tablet 40 mg, 40 mg, Oral, Q breakfast, Mansy, Jan A, MD, 40 mg at 08/17/22 1242   polyvinyl alcohol (LIQUIFILM TEARS) 1.4 % ophthalmic solution 1 drop, 1 drop, Both Eyes, QHS, Mansy, Jan A, MD, 1 drop at 08/17/22 2112   senna-docusate (Senokot-S) tablet 1 tablet, 1 tablet, Oral, QHS PRN, Mansy, Jan A, MD   topiramate (TOPAMAX) tablet 50 mg, 50 mg, Oral, QPM, Mansy, Jan A, MD, 50 mg at 08/17/22 1635   traZODone (DESYREL) tablet 25 mg, 25 mg, Oral, QHS PRN, Mansy, Jan A, MD   triamcinolone (NASACORT) nasal inhaler 2 spray, 2 spray, Each Nare, Daily PRN, Mansy, Arvella Merles, MD   Vitamin E CAPS 400 Units, 400 Units, Oral, Q breakfast, Mansy, Jan A, MD, 400 Units at 08/17/22 1243 Labs CBC    Component Value Date/Time   WBC 5.4 08/18/2022 0406   RBC 2.91 (L) 08/18/2022 0406   HGB 9.6 (L) 08/18/2022 0406   HGB 11.8 (L) 05/22/2020 0804   HCT 28.2 (L) 08/18/2022 0406   PLT 129 (L) 08/18/2022 0406   PLT 223 05/22/2020 0804   MCV 96.9 08/18/2022 0406   MCH 33.0 08/18/2022 0406   MCHC 34.0 08/18/2022 0406   RDW 11.6 08/18/2022 0406   LYMPHSABS 1.3 08/18/2022 0406   MONOABS 0.6 08/18/2022 0406   EOSABS 0.1 08/18/2022 0406   BASOSABS 0.0 08/18/2022 0406    CMP     Component Value Date/Time   NA 138 08/16/2022 0432   K 3.6 08/16/2022 0432   CL 111 08/16/2022 0432   CO2 23 08/16/2022 0432   GLUCOSE 99 08/16/2022 0432   BUN 14 08/16/2022 0432   CREATININE 0.88 08/16/2022 0432   CREATININE 1.03 (H) 05/22/2020 0804   CALCIUM 9.2 08/16/2022 0432   PROT 6.9 08/13/2022 1715   ALBUMIN  4.1 08/13/2022 1715   AST 22 08/13/2022 1715   AST 17 05/22/2020 0804   ALT 17 08/13/2022 1715   ALT 11 05/22/2020 0804   ALKPHOS 95 08/13/2022 1715   BILITOT 0.6 08/13/2022 1715   BILITOT 0.3 05/22/2020 0804   GFRNONAA >60 08/16/2022 0432   GFRNONAA 52 (L) 05/22/2020 0804   GFRAA 60 (L) 05/22/2020 0804    glycosylated hemoglobin-5.7 LDL 86  Imaging I have reviewed images in epic and the results pertinent to this consultation are:  MR of the brain 10/19: Scattered acute to early subacute infarcts in the posterior right frontoparietal region, left cerebellum and left temporal occipital region with minimal petechial blood products at  the left cerebellum without hemorrhagic transformation. For additional punctate foci of acute early to subacute ischemia noted within left temporal occipital region which were not seen on the prior scan.  Small volume acute to early subacute right frontoparietal region which is also new from before.   MRI brain 10/23, personally reviewed, agree with radiology:     1. Interval extension of infarction in the right hemisphere, now seen affecting the deep insula and more of the frontoparietal cortical and subcortical brain. This is consistent with progressive right MCA branch vessel infarction. No evidence of hemorrhage or mass effect. 2. Newly seen subcentimeter focus of acute infarction in the left frontal white matter. 3. Some normalization of diffusion at the location of acute infarctions previously seen in the left posterior temporal lobe and left cerebellum. 4. Chronic small-vessel ischemic changes elsewhere throughout the brain as outlined above.  CTA head and neck from last admission showed short segment severe stenosis/occlusion of the left P1 segment with reconstitution.  Severe stenosis or short segment occlusion of the proximal left P3.  Moderate stenosis of the distal basilar artery.  Mild bilateral carotid bifurcation atherosclerosis without  significant stenosis.  CTA Chest negative for PE   Lower extremity venous Dopplers did not reveal any evidence of DVT at that time.   2D echocardiogram showed LVEF 60 to 65%, normal mitral valve, normal aortic valve, normal left atrial size and no atrial level shunt with bubble study.  Assessment:  81 year old with past history of breast and uterine cancer presenting last week with vertigo and found to have recurrent multifocal acute ischemic strokes in the anterior and posterior circulation ongoing despite DAPT  While sarcoma is not typically associated with high risk of hypercoagulable state, uterine carcinoma on further review of the literature is Gynecol Oncol. 2018 Feb;148(2):267-274. doi: [email or web address].  In discussion w/ patient, family and oncology all are in agreement that risk of AC is outweighed by beneift at this time.  Workup summary: Labs show elevated LDL for which she is on statin (86).  A1c is within goal (5.7%).  CT angiography of the head and neck did not reveal any significant stenosis in the anterior circulation although she has multiple stenoses in the posterior circulation. Stroke etiology remains cryptogenic with strong suspicion for embolic etiology, but event monitor here negative to date even with worsening exam c/f additional events during hospitalization. Transthoracic echo was unremarkable.  Lower extremity Dopplers negative for DVT.  CTA chest negative for PE TEE was done which showed no evidence of any left atrial left appendage or left ventricular thrombi with bubble study being negative.  Hypercoagulable labs ordered by oncology -they are pending at this time unless result listed below  Protein S 83% (ref 60-150%), Protein C 92, ATIII 106, beta-2-glycoprotein IgG/M/A neg, Anti-phospholipid syndrome profile neg,  Pending:  Factor 5 Leiden, Prothrombin gene mutation, D-Dimer 10.2 CA-125 46.2 (3 months ago 12.6)  Impression: Recurrent acute  ischemic stroke in multiple vascular territories - increasing concern that etiology is hypercoagulability of malignancy given elevated D-Dimer and uptrending CA-125 and ongoing ischemic events  History of breast cancer and aggressive form of uterine cancer with uptrending markers.   Recommendations: - Low goal no bolus heparin drip will be ordered after discussion with patient and family Indication  Bolus (units/kg)  Infusion rate (units/kg/hr)  Goal heparin level  Goal aPTT   CVA/TIA  None  12-14  0.3-0.5  66-85   - Code stroke for any sudden change in mental status to rule  out [place] with STAT Head CT and bring pharmacy on board for possible reversal of heparin - Will reach out to oncology for long-term anticoagulation recommendations   - Stop Plavix - continue ASA 81 mg for CAD history - As we are starting [facility] will also try to start normalizing BP   Lesleigh Noe MD-PhD [facility] [phone]  [facility] coverage for [facility] is from 8 AM to 4 AM in-house and 4 PM to 8 PM by telephone/video. 8 PM to 8 AM emergent questions or overnight urgent questions should be addressed to Teleneurology On-call or [facility] neurohospitalist; contact information can be found on AMION  Greater than 50 min spent in care of patient, majority at bedside.

## 2022-08-18 NOTE — Progress Notes (Signed)
[place] for IV heparin Indication: DVT / VTE Treatment  No Known Allergies  Patient Measurements: Height: '5\' 2"'$  (157.5 cm) Weight: 46.3 kg (102 lb) IBW/kg (Calculated) : 50.1 Heparin Dosing Weight: 46.3 kg  Vital Signs: Temp: 98.9 F (37.2 C) (10/24 0430) BP: 154/77 (10/24 0430) Pulse Rate: 66 (10/24 0430)  Labs: Recent Labs    08/16/22 0432 08/17/22 1643 08/18/22 0406  HGB  --  9.4* 9.6*  HCT  --  27.2* 28.2*  PLT  --  127* 129*  CREATININE 0.88  --   --     Estimated Creatinine Clearance: 36 mL/min (by C-G formula based on SCr of 0.88 mg/dL).   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[place])    Ductal carcinoma in situ (DCIS) of right breast    Fibrocystic breast disease    GERD (gastroesophageal reflux disease)    Hearing aid worn    bilateral   History of hiatal hernia    Hyperlipidemia    Lung nodule    Menopause    Migraine    Migraine    optical migraines   Motion sickness    Plantar fasciitis    Prolapse of female pelvic organs    hodge pessary   Raynaud's disease    Sciatic leg pain    Sciatica    Skin cancer of face    Stroke [facility])    Urinary retention with incomplete bladder emptying    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 bASA inpatient, holding Plavix   Assessment: 81 year old female admitted with recurrent acute ischemic stroke. Past medical history includes breast cancer, uterine cancer. Concern for hypercoagulability of malignancy.  Elevated D-dimer   CTA: Similar distribution and appearance of tree-in-bud nodularities, greatest within the RIGHT lung base. Findings are likely to reflect an infectious or inflammatory process.  Goal  of Therapy:  Heparin level 0.3-0.5 units/ml Monitor platelets by anticoagulation protocol: Yes   Plan:  No bolus Start heparin infusion at 550 units/hr Check anti-Xa level in 8 hours and daily while on heparin CBC daily   Wynelle Cleveland 08/18/2022,9:24 AM

## 2022-08-18 NOTE — Progress Notes (Signed)
Inpatient Rehab Coordinator Note:  I spoke with son and patient over the phone to discuss CIR recommendations and goals/expectations of CIR stay.  We reviewed 3 hrs/day of therapy, physician follow up, and average length of stay 2 weeks (dependent upon progress) with goals of mod I/min A.  Patient and son are in agreement with pursuing CIR. We will not be able to take patient while she is on heparin drip. Will open case with insurance in next 1-2 days for prior approval.  Will continue to follow.   Rehab Admissons Coordinator [place], [place], [place] [phone]

## 2022-08-18 NOTE — Progress Notes (Signed)
Occupational Therapy Treatment Patient Details Name: ELYSABETH Friedman MRN: [id number] DOB: Feb 19, 1941 Today's Date: 08/18/2022   History of present illness Pt is an 81 year old female admitted  with recurrent subacute to acute CVA, concerning for central involvement source after presenting to ED with dizziness and right-sided paresthesias; PMH significant for past history of breast and uterine cancer, recurrent CVA, dyslipidemia, GERD, Osteoarthritis, vitamin B12 deficiency and migraine. 10/24 update: Pt now with confirmed extension of CVA, new LUE deficits and L visual field deficits, new balance deficits.   OT comments  Pt received semi-reclined in bed. Appearing alert and ready for therapy session; willing to work with OT on ADLs in standing at sink and LUE NMRE exercises. See flowsheet below for further details of session. Left semi-reclined in bed, family in room, with all needs in reach.  Pt continues to be appropriate for acute rehab level of care at d/c, and requires this level of care to return to (I) level of function; pt requiring assistance for standing balance today, finding items on countertop on L side, transfer to standing, and use of LUE during ADL activities along with compensatory strategies. Pt is highly motivated and participatory during therapy session.     Recommendations for follow up therapy are one component of a multi-disciplinary discharge planning process, led by the attending physician.  Recommendations may be updated based on patient status, additional functional criteria and insurance authorization.    Follow Up Recommendations  Acute inpatient rehab (3hours/day)    Assistance Recommended at Discharge Frequent or constant Supervision/Assistance  Patient can return home with the following  A lot of help with walking and/or transfers;A lot of help with bathing/dressing/bathroom;Assistance with cooking/housework;Direct supervision/assist for medications management;Direct  supervision/assist for financial management;Assist for transportation;Help with stairs or ramp for entrance   Equipment Recommendations  Other (comment) (pending next venue of care)    Recommendations for Other Services      Precautions / Restrictions Precautions Precautions: Fall Precaution Comments: Also, pt needs cues to attend to LUE during transfers/mobility. Restrictions Weight Bearing Restrictions: No       Mobility Bed Mobility Overal bed mobility: Needs Assistance Bed Mobility: Supine to Sit, Sit to Supine     Supine to sit: Min guard (cues for LUE management) Sit to supine: Supervision   General bed mobility comments: Needs cues for LUE management.    Transfers Overall transfer level: Needs assistance Equipment used: None (Stood and took one step forward from EOB to the sink/countertop.) Transfers: Sit to/from Stand Sit to Stand: Min assist (handheld in LUE and cues for using RUE to assist by pushing from bed.)                 Balance Overall balance assessment: Needs assistance Sitting-balance support: Feet supported Sitting balance-Leahy Scale: Good                                     ADL either performed or assessed with clinical judgement   ADL Overall ADL's : Needs assistance/impaired     Grooming: Oral care;Cueing for compensatory techniques;Minimal assistance;Standing (MIN A for standing balance) Grooming Details (indicate cue type and reason): Pt requiring MIN A for standing balance at sink, with cues for how to use LUE as an assist (as pt typically uses LUE for unscrewing toothpaste top) during toothpaste opening, toothpaste on toothbrush, and then brushing teeth (with RUE); OT cues for pt  to place L hand on countertop for support while brushing. Overall standing x approx 8 minutes at sink. Intermittent cues for standing up tall (pt leaning to the L). Pt able to wash face with washcloth with MIN A standing balance.                                     Extremity/Trunk Assessment Upper Extremity Assessment Upper Extremity Assessment: LUE deficits/detail LUE Deficits / Details: Continued LUE proprioception deficits in L hand/fingers; appears intact at wrist and elbow. Sensation appears inact. Significant LUE coordination deficits in [facility] and [place]. Delayed response in LUE. Moderate L neglect of LUE. LUE:  (No shoulder issues noted today) LUE Sensation: decreased proprioception LUE Coordination: decreased fine motor;decreased gross motor   Lower Extremity Assessment Lower Extremity Assessment: Defer to PT evaluation;LLE deficits/detail        Vision       Perception     Praxis      Cognition Arousal/Alertness: Awake/alert Behavior During Therapy: WFL for tasks assessed/performed Overall Cognitive Status: Within Functional Limits for tasks assessed                                 General Comments: Pt appears to remember OT from yesterday's session. Follows cues with direction.        Exercises Exercises: Hand exercises, General Upper Extremity Other Exercises Other Exercises: OT showed pt and family how to do NMRE exercises at tray table from semi-reclined in bed for LUE coordination, grip/pinch- moving soap and deodorant containers and small cup from L side of tray to R side of tray with gross grasp or 2 or 3 pinch. OT also showed pt how to place cup in midline into tissue box (small space, just bigger than bottom of the cup) directly in front), challenging coordination, strength, and ROM of LUE. Pt demonstrated return of these exercises and two family members present for this teaching. Encouraged pt to practice several times per day, but also to get LUE rest throughout the day. Pt and family acknowledged understanding.    Shoulder Instructions       General Comments      Pertinent Vitals/ Pain       Pain Assessment Pain Assessment: 0-10 Pain Score: 5  Pain Location:  headache Pain Descriptors / Indicators: Aching Pain Intervention(s): Limited activity within patient's tolerance, Repositioned, Premedicated before session  Home Living                                          Prior Functioning/Environment              Frequency  Min 3X/week (Up to 7x/week)        Progress Toward Goals  OT Goals(current goals can now be found in the care plan section)  Progress towards OT goals: Progressing toward goals  Acute Rehab OT Goals Patient Stated Goal: Get to rehab OT Goal Formulation: With patient/family Time For Goal Achievement: 08/28/22 Potential to Achieve Goals: Good ADL Goals Pt Will Perform Grooming: with modified independence Pt Will Perform Lower Body Dressing: with modified independence Pt Will Transfer to Toilet: with modified independence Pt Will Perform Toileting - Clothing Manipulation and hygiene: with modified independence  Plan Discharge plan remains  appropriate    Co-evaluation                 AM-PAC OT "6 Clicks" Daily Activity     Outcome Measure   Help from another person eating meals?: A Little Help from another person taking care of personal grooming?: A Little Help from another person toileting, which includes using toliet, bedpan, or urinal?: A Lot Help from another person bathing (including washing, rinsing, drying)?: A Lot Help from another person to put on and taking off regular upper body clothing?: A Little Help from another person to put on and taking off regular lower body clothing?: A Lot 6 Click Score: 15    End of Session    OT Visit Diagnosis: Unsteadiness on feet (R26.81);Muscle weakness (generalized) (M62.81)   Activity Tolerance Patient tolerated treatment well   Patient Left in bed;with call bell/phone within reach;with family/visitor present   Nurse Communication Mobility status        Time: 0867-6195 OT Time Calculation (min): 39 min  Charges: OT General  Charges $OT Visit: 1 Visit OT Treatments $Self Care/Home Management : 23-37 mins $Neuromuscular Re-education: 8-22 mins  Waymon Amato, MS, OTR/L   Elizabeth Friedman 08/18/2022, 11:43 AM

## 2022-08-18 NOTE — Progress Notes (Signed)
PROGRESS NOTE    Elizabeth Friedman  FBP:[id number] DOB: [birth year] DOA: 08/13/2022 PCP: Adin Hector, MD   Brief Narrative: 81 year old with past medical history significant for recurrent CVA, dyslipidemia, GERD, osteoarthritis, vitamin B12 deficiency, breast and uterine cancer who was recently admitted for stroke on 10/13 who presents now with dizziness, right thigh paresthesia and tingling, she wake up the morning of admission with the symptoms.  She has been taking aspirin and Plavix.  MRI confirmed early subacute infarct involving the posterior right frontal parietal region, left cerebellum and left temporal occipital region.  Minimal petechial blood products at the level of the cerebellum without hemorrhagic transformation or significant regional mass effect.  Patient admitted with recurrent stroke, while in the hospital she develop further neurological deficits and recurrent stroke.  Due to increased concern for hypercoagulability in the setting of malignancy, an elevated D-dimer and uptrending CEA 125 neurology and oncology agreed that benefits outweighed risk and patient was a started on anticoagulation.  Patient will be started on low-dose heparin, if she tolerates heparin plan is to transition to Eliquis.  Patient will require inpatient rehab admission after she is transition to eliquis.     Assessment & Plan:   Principal Problem:   Acute CVA (cerebrovascular accident) [facility]) Active Problems:   Dyslipidemia   GERD without esophagitis   Migraine   CVA (cerebral vascular accident) ([place])  1-Acute CVA: Recurrent's Stroke. Concern for hypercoagulability in setting of malignancy  -Recurrent strokes on aspirin and Plavix. -Patient presented with dizziness and right-sided paresthesia. -MRI: Scattered acute to early subacute infarct involving the posterior right frontoparietal region, left cerebellum and left temporal occipital region. -Underwent TEE negative. -10/22: Patient develops  fine motor deficit, left hand decreased grip, left side drift , patient notice deficit at 1;15 am.  -10-23: notice to have new worsening left LE weakness.  -Repeated MRI 10/23: Interval extension and infarction in the right hemisphere, now affecting the deep insula and more of the frontoparietal cortical and subcortical brain.  Consistent with progressive right MCA branch vessel infarct.  Newly seen subcentimeter focus of acute infarct in the left frontal white matter.. -Due to concern for hypercoagulability, in the setting of malignancy, elevated D-dimer, recurrent stroke, no evidence of A-fib, patient was a started on low rate heparin drip, goal to transition to Eliquis if she is able to tolerate low rate heparin. -Plavix discontinue, continue aspirin per neurology -Started Norvasc to controlled BP.   Dyslipidemia: Continue with Lipitor  GERD: Continue with PPI  Migraine: On Topamax Bronchiectasis. Suppose to be on Azithromycin for chronic bronchiectasis for 3 month TIW per Dr Lanney Gins note 06/2022. I have resume Azithromycin.   History of  urine and breast cancer: Oncology is consulted to provide input in regards hypercoagulability in the setting of recurrent stroke    Estimated body mass index is 18.66 kg/m as calculated from the following:   Height as of this encounter: '5\' 2"'$  (1.575 m).   Weight as of this encounter: 46.3 kg.   DVT prophylaxis: Lovenox Code Status: DNR Family Communication: Patient. Son at bedside.  Disposition Plan:  Status is: Observation The patient will require care spanning > 2 midnights and should be moved to inpatient because: Recurrent stroke. CIR evaluation.     Consultants:  Neurology Oncology  Procedures:    Antimicrobials:    Subjective: Neuro deficit stable  She is getting over a headaches    Objective: Vitals:   08/17/22 1525 08/17/22 2009 08/17/22 2341 08/18/22 0430  BP: (!) 145/77 (!) 154/86 (!) 162/82 (!) 154/77  Pulse: 85  71 78 66  Resp: '18 18 16 16  '$ Temp: 98.4 F (36.9 C) 98.1 F (36.7 C) 98.2 F (36.8 C) 98.9 F (37.2 C)  TempSrc:      SpO2: 99% 98% 97% 97%  Weight:      Height:        Intake/Output Summary (Last 24 hours) at 08/18/2022 1100 Last data filed at 08/18/2022 0933 Gross per 24 hour  Intake 1336.14 ml  Output 350 ml  Net 986.14 ml    Filed Weights   08/14/22 1449 08/17/22 0802  Weight: 50.4 kg 46.3 kg    Examination:  General exam: NAD Respiratory system: CTA Cardiovascular system: S 1, S  2 RRR Gastrointestinal system: BS present, soft, nt Central nervous system: Alert, follows command, left upper extremity drift, decreased left hand grip, left LE mild weakness.  Extremities: No edema   Data Reviewed: I have personally reviewed following labs and imaging studies  CBC: Recent Labs  Lab 08/13/22 1715 08/17/22 1643 08/18/22 0406  WBC 4.6 7.1 5.4  NEUTROABS 2.8  --  3.4  HGB 11.0* 9.4* 9.6*  HCT 32.5* 27.2* 28.2*  MCV 97.6 96.8 96.9  PLT 125* 127* 129*    Basic Metabolic Panel: Recent Labs  Lab 08/13/22 1715 08/16/22 0432  NA 131* 138  K 4.1 3.6  CL 100 111  CO2 23 23  GLUCOSE 91 99  BUN 18 14  CREATININE 0.87 0.88  CALCIUM 9.2 9.2    GFR: Estimated Creatinine Clearance: 36 mL/min (by C-G formula based on SCr of 0.88 mg/dL). Liver Function Tests: Recent Labs  Lab 08/13/22 1715  AST 22  ALT 17  ALKPHOS 95  BILITOT 0.6  PROT 6.9  ALBUMIN 4.1    No results for input(s): "LIPASE", "AMYLASE" in the last 168 hours. No results for input(s): "AMMONIA" in the last 168 hours. Coagulation Profile: Recent Labs  Lab 08/15/22 0909 08/18/22 0942  INR 1.2 1.3*    Cardiac Enzymes: No results for input(s): "CKTOTAL", "CKMB", "CKMBINDEX", "TROPONINI" in the last 168 hours. BNP (last 3 results) No results for input(s): "PROBNP" in the last 8760 hours. HbA1C: No results for input(s): "HGBA1C" in the last 72 hours. CBG: Recent Labs  Lab  08/16/22 0831  GLUCAP 85    Lipid Profile: No results for input(s): "CHOL", "HDL", "LDLCALC", "TRIG", "CHOLHDL", "LDLDIRECT" in the last 72 hours.  Thyroid Function Tests: No results for input(s): "TSH", "T4TOTAL", "FREET4", "T3FREE", "THYROIDAB" in the last 72 hours. Anemia Panel: No results for input(s): "VITAMINB12", "FOLATE", "FERRITIN", "TIBC", "IRON", "RETICCTPCT" in the last 72 hours. Sepsis Labs: No results for input(s): "PROCALCITON", "LATICACIDVEN" in the last 168 hours.  No results found for this or any previous visit (from the past 240 hour(s)).        Radiology Studies: CT Angio Chest Pulmonary Embolism (PE) W or WO Contrast  Result Date: 08/17/2022 CLINICAL DATA:  Pulmonary embolism (PE) suspected, high prob EXAM: CT ANGIOGRAPHY CHEST WITH CONTRAST TECHNIQUE: Multidetector CT imaging of the chest was performed using the standard protocol during bolus administration of intravenous contrast. Multiplanar CT image reconstructions and MIPs were obtained to evaluate the vascular anatomy. RADIATION DOSE REDUCTION: This exam was performed according to the departmental dose-optimization program which includes automated exposure control, adjustment of the mA and/or kV according to patient size and/or use of iterative reconstruction technique. CONTRAST:  34m OMNIPAQUE IOHEXOL 350 MG/ML SOLN COMPARISON:  Chest XR,  08/08/2022. CT chest, 08/07/2022. NM V/Q scan, 08/08/2022 FINDINGS: Cardiovascular: Satisfactory opacification of the pulmonary arteries to the segmental level. No evidence of pulmonary embolism. Normal heart size. No pericardial effusion. Mediastinum/Nodes: No enlarged mediastinal, hilar, or axillary lymph nodes. Thyroid gland, trachea, and esophagus demonstrate no significant findings. Lungs/Pleura: No focal consolidation or mass. Similar distribution and appearance of tree-in-bud nodularities, greatest within the RIGHT lung base. LEFT basilar calcified granuloma. No pleural  effusion or pneumothorax. Upper Abdomen: No acute abnormality. Punctate calcified granulomas within liver and spleen, likely sequelae of prior granulomatous disease. Musculoskeletal: No chest wall abnormality. No acute or significant osseous findings. Review of the MIP images confirms the above findings. IMPRESSION: 1.  No segmental or larger pulmonary embolus. 2. Similar distribution and appearance of tree-in-bud nodularities, greatest within the RIGHT lung base. Findings are likely to reflect an infectious or inflammatory process. 3. Punctate calcified granulomas within chest and abdomen, likely sequelae of prior granulomatous disease. Electronically Signed   By: Michaelle Birks M.D.   On: 08/17/2022 16:42   MR CERVICAL SPINE W WO CONTRAST  Result Date: 08/17/2022 CLINICAL DATA:  Ataxia.  Left-sided weakness.  Stroke. EXAM: MRI CERVICAL SPINE WITHOUT AND WITH CONTRAST TECHNIQUE: Multiplanar and multiecho pulse sequences of the cervical spine, to include the craniocervical junction and cervicothoracic junction, were obtained without and with intravenous contrast. CONTRAST:  93m GADAVIST GADOBUTROL 1 MMOL/ML IV SOLN COMPARISON:  None Available. FINDINGS: Alignment: Normal Vertebrae: No fracture or focal lesion. Cord: No cord insult.  No cord compression. Posterior Fossa, vertebral arteries, paraspinal tissues: See results of brain MRI. Disc levels: The foramen magnum is widely patent. There is ordinary mild osteoarthritis of the C1-2 articulation but no encroachment upon the neural structures. C2-3: Facet osteoarthritis left more than right. No compressive stenosis. C3-4: Bulging of the disc. Facet osteoarthritis left more than right. Moderate left foraminal narrowing. C4-5: Spondylosis with endplate osteophytes and bulging of the disc. No cord compression. Moderate bilateral foraminal narrowing. C5-6: Spondylosis with endplate osteophytes and bulging of the disc. No cord compression. Moderate bilateral foraminal  narrowing. C6-7: Small right paracentral disc protrusion. No neural compression. C7-T1: Mild facet osteoarthritis.  No stenosis. IMPRESSION: 1. No cord insult. 2. Degenerative spondylosis from C3-4 through C5-6. No cord compression. Foraminal narrowing that could possibly be symptomatic on the left at C3-4 and bilateral at C4-5 and C5-6. Electronically Signed   By: MNelson ChimesM.D.   On: 08/17/2022 14:13   MR BRAIN W WO CONTRAST  Result Date: 08/17/2022 CLINICAL DATA:  Follow-up stroke.  Right-sided weakness. EXAM: MRI HEAD WITHOUT AND WITH CONTRAST TECHNIQUE: Multiplanar, multiecho pulse sequences of the brain and surrounding structures were obtained without and with intravenous contrast. CONTRAST:  538mGADAVIST GADOBUTROL 1 MMOL/ML IV SOLN COMPARISON:  Head CT yesterday.  MRI 08/13/2022. FINDINGS: Brain: Small areas of acute infarction previously seen in the left cerebellum are normalizing on diffusion imaging. No new cerebellar or brainstem insult. There has been extension of the region of infarction in the right hemisphere, now seen affecting the deep insula and more of the frontoparietal cortical and subcortical brain. This is consistent with progressive right MCA branch vessel infarction. Newly seen subcentimeter focus of acute infarction in the left frontal white matter. Normalization of diffusion at the location of punctate acute infarctions previously seen in the left posterior temporal lobe. No evidence of hemorrhage or mass effect. Elsewhere, chronic ischemic changes of the pons, thalami, basal ganglia and hemispheric white matter are stable. No hydrocephalus. No extra-axial  collection. After contrast administration, some foci of enhancement are notable in the regions of infarction in the left cerebellum and right frontoparietal region. Vascular: Major vessels at the base of the brain show flow. Skull and upper cervical spine: Negative Sinuses/Orbits: Clear/normal Other: None IMPRESSION: 1. Interval  extension of infarction in the right hemisphere, now seen affecting the deep insula and more of the frontoparietal cortical and subcortical brain. This is consistent with progressive right MCA branch vessel infarction. No evidence of hemorrhage or mass effect. 2. Newly seen subcentimeter focus of acute infarction in the left frontal white matter. 3. Some normalization of diffusion at the location of acute infarctions previously seen in the left posterior temporal lobe and left cerebellum. 4. Chronic small-vessel ischemic changes elsewhere throughout the brain as outlined above. Electronically Signed   By: Nelson Chimes M.D.   On: 08/17/2022 14:10   ECHO TEE  Result Date: 08/17/2022    TRANSESOPHOGEAL ECHO REPORT   Patient Name:   SYMPHONIE SCHNEIDERMAN Date of Exam: 08/17/2022 Medical Rec #:  [id number]    Height:       62.0 in Accession #:    [id number]   Weight:       102.0 lb Date of Birth:  [birth year]   BSA:          1.436 m Patient Age:    72 years     BP:           140/75 mmHg Patient Gender: F            HR:           72 bpm. Exam Location:  [facility] Procedure: Transesophageal Echo, Cardiac Doppler, Color Doppler and Saline            Contrast Bubble Study Indications:     Acute CVA  History:         Patient has prior history of Echocardiogram examinations, most                  recent 08/10/2022. Stroke. Atypical chest pain , Breast cancer.  Sonographer:     Sherrie Sport Referring Phys:  [id number] A Jaquelynn Wanamaker Diagnosing Phys: Neoma Laming PROCEDURE: The transesophogeal probe was passed without difficulty through the esophogus of the patient. Sedation performed by performing physician. The patient developed no complications during the procedure. IMPRESSIONS  1. Left ventricular ejection fraction, by estimation, is 65 to 70%. The left ventricle has normal function.  2. Right ventricular systolic function is normal. The right ventricular size is normal.  3. Left atrial size was mildly dilated. No left atrial/left atrial  appendage thrombus was detected.  4. Right atrial size was mildly dilated.  5. The mitral valve is grossly normal. Trivial mitral valve regurgitation.  6. The aortic valve is calcified. Aortic valve regurgitation is trivial.  7. Agitated saline contrast bubble study was negative, with no evidence of any interatrial shunt. Conclusion(s)/Recommendation(s): No LA/LAA thrombus identified. Negative bubble study for interatrial shunt. No intracardiac source of embolism detected on this on this transesophageal echocardiogram. FINDINGS  Left Ventricle: Left ventricular ejection fraction, by estimation, is 65 to 70%. The left ventricle has normal function. The left ventricular internal cavity size was normal in size. Right Ventricle: The right ventricular size is normal. No increase in right ventricular wall thickness. Right ventricular systolic function is normal. Left Atrium: Left atrial size was mildly dilated. No left atrial/left atrial appendage thrombus was detected. Right Atrium: Right atrial size was mildly dilated.  Pericardium: There is no evidence of pericardial effusion. Mitral Valve: The mitral valve is grossly normal. Trivial mitral valve regurgitation. Tricuspid Valve: The tricuspid valve is grossly normal. Tricuspid valve regurgitation is mild. Aortic Valve: The aortic valve is calcified. Aortic valve regurgitation is trivial. Pulmonic Valve: The pulmonic valve was normal in structure. Pulmonic valve regurgitation is trivial. Aorta: The aortic root, ascending aorta and aortic arch are all structurally normal, with no evidence of dilitation or obstruction. IAS/Shunts: No atrial level shunt detected by color flow Doppler. Agitated saline contrast was given intravenously to evaluate for intracardiac shunting. Agitated saline contrast bubble study was negative, with no evidence of any interatrial shunt. [place] Electronically signed by Neoma Laming Signature Date/Time: 08/17/2022/9:33:10 AM    Final          Scheduled Meds:  amLODipine  5 mg Oral Daily   ascorbic acid  250 mg Oral Daily   aspirin  81 mg Oral Daily   atorvastatin  40 mg Oral Daily   azithromycin  500 mg Oral Once per day on Mon Wed Fri   cholecalciferol  5,000 Units Oral Q breakfast   vitamin B-12  500 mcg Oral Daily   cycloSPORINE  1 drop Both Eyes BID   Ferrous Fumarate  1 tablet Oral Daily   gabapentin  400 mg Oral BID   pantoprazole  40 mg Oral Q breakfast   polyvinyl alcohol  1 drop Both Eyes QHS   topiramate  50 mg Oral QPM   vitamin E  400 Units Oral Q breakfast   Continuous Infusions:  sodium chloride 75 mL/hr at 08/18/22 0433   heparin       LOS: 4 days    Time spent: 35 Minutes.     Elmarie Shiley, MD [facility]   If 7PM-7AM, please contact night-coverage [email or web address]  08/18/2022, 11:00 AM

## 2022-08-18 NOTE — Progress Notes (Signed)
[place] for IV Heparin Indication: DVT / VTE Treatment  Patient Measurements: Height: '5\' 2"'$  (157.5 cm) Weight: 46.3 kg (102 lb) IBW/kg (Calculated) : 50.1 Heparin Dosing Weight: 46.3 kg  Labs: Recent Labs    08/16/22 0432 08/17/22 1643 08/18/22 0406 08/18/22 0942 08/18/22 1959  HGB  --  9.4* 9.6*  --   --   HCT  --  27.2* 28.2*  --   --   PLT  --  127* 129*  --   --   APTT  --   --   --  27  --   LABPROT  --   --   --  15.6*  --   INR  --   --   --  1.3*  --   HEPARINUNFRC  --   --   --   --  0.22*  CREATININE 0.88  --   --   --   --      Estimated Creatinine Clearance: 36 mL/min (by C-G formula based on SCr of 0.88 mg/dL).   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[place])    Ductal carcinoma in situ (DCIS) of right breast    Fibrocystic breast disease    GERD (gastroesophageal reflux disease)    Hearing aid worn    bilateral   History of hiatal hernia    Hyperlipidemia    Lung nodule    Menopause    Migraine    Migraine    optical migraines   Motion sickness    Plantar fasciitis    Prolapse of female pelvic organs    hodge pessary   Raynaud's disease    Sciatic leg pain    Sciatica    Skin cancer of face    Stroke [facility])    Urinary retention with incomplete bladder emptying    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 bASA inpatient, holding Plavix   Assessment: 81 year old female admitted with recurrent acute ischemic stroke. Past medical history includes breast cancer, uterine cancer. Concern for hypercoagulability of malignancy.  Elevated D-dimer   CTA: Similar distribution and appearance of tree-in-bud nodularities, greatest within the RIGHT lung  base. Findings are likely to reflect an infectious or inflammatory process.  [place] 0.22, subthera; 550 un/hr  Goal of Therapy:  Heparin level 0.3-0.5 units/ml Monitor platelets by anticoagulation protocol: Yes   Plan:  --Heparin level is subtherapeutic --Increase heparin infusion to 600 units/hr --Re-check HL 8 hours from rate change --Daily CBC per protocol while on IV heparin   Benita Gutter 08/18/2022,8:44 PM

## 2022-08-19 DIAGNOSIS — I639 Cerebral infarction, unspecified: Secondary | ICD-10-CM | POA: Diagnosis not present

## 2022-08-19 LAB — CBC
HCT: 28.9 % — ABNORMAL LOW (ref 36.0–46.0)
Hemoglobin: 9.8 g/dL — ABNORMAL LOW (ref 12.0–15.0)
MCH: 32.5 pg (ref 26.0–34.0)
MCHC: 33.9 g/dL (ref 30.0–36.0)
MCV: 95.7 fL (ref 80.0–100.0)
Platelets: 140 10*3/uL — ABNORMAL LOW (ref 150–400)
RBC: 3.02 MIL/uL — ABNORMAL LOW (ref 3.87–5.11)
RDW: 11.8 % (ref 11.5–15.5)
WBC: 5.9 10*3/uL (ref 4.0–10.5)
nRBC: 0 % (ref 0.0–0.2)

## 2022-08-19 LAB — BASIC METABOLIC PANEL
Anion gap: 5 (ref 5–15)
BUN: 11 mg/dL (ref 8–23)
CO2: 24 mmol/L (ref 22–32)
Calcium: 9.1 mg/dL (ref 8.9–10.3)
Chloride: 109 mmol/L (ref 98–111)
Creatinine, Ser: 0.76 mg/dL (ref 0.44–1.00)
GFR, Estimated: >60 mL/min (ref >60)
Glucose, Bld: 98 mg/dL (ref 70–99)
Potassium: 3.6 mmol/L (ref 3.5–5.1)
Sodium: 138 mmol/L (ref 135–145)

## 2022-08-19 LAB — HEPARIN LEVEL (UNFRACTIONATED)
Heparin Unfractionated: 0.35 IU/mL (ref 0.30–0.70)
Heparin Unfractionated: 0.22 IU/mL — ABNORMAL LOW (ref 0.30–0.70)

## 2022-08-19 MED ORDER — APIXABAN 5 MG PO TABS
5.0000 mg | ORAL_TABLET | Freq: Two times a day (BID) | ORAL | Status: DC
  Administered 2022-08-19 – 2022-08-20 (×3): 5 mg via ORAL
  Filled 2022-08-19 (×3): qty 1

## 2022-08-19 NOTE — TOC Progression Note (Signed)
Transition of Care [facility]) - Progression Note    Patient Details  Name: Elizabeth Friedman MRN: [id number] Date of Birth: 08/27/1941  [facility] CM/SW [place], [place] Phone Number: 08/19/2022, 3:23 PM  Clinical Narrative:     SW spoke to the inpatient rehab liaison regarding this patient. Family is requesting [facility]. [facility] is still working on Ship broker.       Expected Discharge Plan and Services     Discharge Planning Services: CM Consult   Living arrangements for the past 2 months: Single Family Home                                       Social Determinants of Health (SDOH) Interventions    Readmission Risk Interventions    08/18/2022    4:24 PM  Readmission Risk Prevention Plan  Transportation Screening Complete  PCP or Specialist Appt within 3-5 Days Complete  HRI or [place] Complete  Social Work Consult for [place] Planning/Counseling Complete  Palliative Care Screening Complete  Medication Review Press photographer) Complete

## 2022-08-19 NOTE — Progress Notes (Signed)
Inpatient Rehab Admissions Coordinator:    CIR following for potential admit. Will open case with insurance today  Clemens Catholic, [place], [place] Admissions Coordinator  [phone] ([place]) [phone] (office)

## 2022-08-19 NOTE — Progress Notes (Signed)
Neurology Progress Note   S:// No new neuro complaints from patient/family initially; they report patient is improving, no further headache  O:// Current vital signs: BP 139/74 (BP Location: Right Arm)   Pulse 80   Temp 98.8 F (37.1 C)   Resp 16   Ht '5\' 2"'$  (1.575 m)   Wt 46.3 kg   SpO2 100%   BMI 18.66 kg/m  Vital signs in last 24 hours: Temp:  [98.2 F (36.8 C)-99.1 F (37.3 C)] 98.8 F (37.1 C) (10/25 1147) Pulse Rate:  [62-80] 80 (10/25 1147) Resp:  [16-18] 16 (10/25 1147) BP: (132-151)/(72-81) 139/74 (10/25 1147) SpO2:  [97 %-100 %] 100 % (10/25 1147) Gen: Awake alert in no distress initially, then moderate headache HEENT: Normocephalic atraumatic Lungs: Clear Cardiovascular: Regular rate rhythm Abdomen nondistended nontender Extremities warm well perfused without any evidence of edema Neurological exam She is awake alert oriented but sometimes slow to respond There is some mild dysarthria  No evidence of aphasia-able to provide clear history. No neglect to DSS but does have a slightly preference to look to the right, improved today Some left NLF flattening. Saccadic pursuits, mild difficulty with LLQ finger counting (much improved, Ms. reports 5 fingers S4 but correctly counts 2 and 3 fingers in the quadrant). PERRL, tongue midline, sensation intact bilaterally in the face Stable motor examination is notable for 4- to 5 strength throughout the LUE with coaching, flexion a little stronger than extension worse distally than proximally. However she does have pronator drift of the LUE today which is stable and there is slight drift of the LLE with 4/5 hip flexion compared to 5/5 on the right and some subtle weakness in an upper motor neuron pattern in the LLE flexors proximal more than distal  Sensation is intact to light touch/temperature throughout all four extremities  Coordination exam-no dysmetria (not out of proporption to weakness) Gait testing was deferred at this  time NIH stroke scale- 3 for left arm and leg drift and LLQ partial hemianopia (the latter is improving)  Medications  Current Facility-Administered Medications:    acetaminophen (TYLENOL) tablet 650 mg, 650 mg, Oral, Q4H PRN, 650 mg at 08/18/22 2033 **OR** acetaminophen (TYLENOL) 160 MG/5ML solution 650 mg, 650 mg, Per Tube, Q4H PRN **OR** acetaminophen (TYLENOL) suppository 650 mg, 650 mg, Rectal, Q4H PRN, Mansy, Jan A, MD   amLODipine (NORVASC) tablet 5 mg, 5 mg, Oral, Daily, Regalado, Belkys A, MD, 5 mg at 08/19/22 4098   ascorbic acid (VITAMIN C) tablet 250 mg, 250 mg, Oral, Daily, Mansy, Jan A, MD, 250 mg at 08/19/22 1191   aspirin chewable tablet 81 mg, 81 mg, Oral, Daily, Mansy, Jan A, MD, 81 mg at 08/19/22 0948   atorvastatin (LIPITOR) tablet 40 mg, 40 mg, Oral, Daily, Mansy, Jan A, MD, 40 mg at 08/19/22 0946   azithromycin (ZITHROMAX) tablet 500 mg, 500 mg, Oral, Once per day on Mon Wed Fri, Regalado, Belkys A, MD, 500 mg at 08/19/22 4782   cholecalciferol (VITAMIN D3) tablet 5,000 Units, 5,000 Units, Oral, Q breakfast, Mansy, Jan A, MD, 5,000 Units at 08/19/22 9562   cyanocobalamin (VITAMIN B12) tablet 500 mcg, 500 mcg, Oral, Daily, Mansy, Jan A, MD, 500 mcg at 08/19/22 1308   cycloSPORINE (RESTASIS) 0.05 % ophthalmic emulsion 1 drop, 1 drop, Both Eyes, BID, Mansy, Jan A, MD, 1 drop at 08/19/22 6578   Ferrous Fumarate (HEMOCYTE - 106 mg FE) tablet 106 mg of iron, 1 tablet, Oral, Daily, Mansy, Arvella Merles, MD,  106 mg of iron at 08/19/22 0947   gabapentin (NEURONTIN) capsule 400 mg, 400 mg, Oral, BID, Mansy, Jan A, MD, 400 mg at 08/19/22 0947   heparin ADULT infusion 100 units/mL (25000 units/261m), 600 Units/hr, Intravenous, Continuous, CBenita Gutter RPH, Last Rate: 6 mL/hr at 08/18/22 2109, 600 Units/hr at 08/18/22 2109   hydrALAZINE (APRESOLINE) injection 10 mg, 10 mg, Intravenous, Q4H PRN, Regalado, Belkys A, MD   labetalol (NORMODYNE) injection 5 mg, 5 mg, Intravenous, Q2H PRN,  Regalado, Belkys A, MD   ondansetron (ZOFRAN) injection 4 mg, 4 mg, Intravenous, Q4H PRN, Mansy, JArvella Merles MD   Oral care mouth rinse, 15 mL, Mouth Rinse, PRN, Regalado, Belkys A, MD   pantoprazole (PROTONIX) EC tablet 40 mg, 40 mg, Oral, Q breakfast, Mansy, Jan A, MD, 40 mg at 08/19/22 02831  polyvinyl alcohol (LIQUIFILM TEARS) 1.4 % ophthalmic solution 1 drop, 1 drop, Both Eyes, QHS, Mansy, Jan A, MD, 1 drop at 08/18/22 2036   senna-docusate (Senokot-S) tablet 1 tablet, 1 tablet, Oral, QHS PRN, Mansy, Jan A, MD   topiramate (TOPAMAX) tablet 50 mg, 50 mg, Oral, QPM, Mansy, Jan A, MD, 50 mg at 08/18/22 1842   traZODone (DESYREL) tablet 25 mg, 25 mg, Oral, QHS PRN, Mansy, Jan A, MD   triamcinolone (NASACORT) nasal inhaler 2 spray, 2 spray, Each Nare, Daily PRN, Mansy, JArvella Merles MD   Vitamin E CAPS 400 Units, 400 Units, Oral, Q breakfast, Mansy, Jan A, MD, 400 Units at 08/19/22 0947 Labs CBC    Component Value Date/Time   WBC 5.9 08/19/2022 0443   RBC 3.02 (L) 08/19/2022 0443   HGB 9.8 (L) 08/19/2022 0443   HGB 11.8 (L) 05/22/2020 0804   HCT 28.9 (L) 08/19/2022 0443   PLT 140 (L) 08/19/2022 0443   PLT 223 05/22/2020 0804   MCV 95.7 08/19/2022 0443   MCH 32.5 08/19/2022 0443   MCHC 33.9 08/19/2022 0443   RDW 11.8 08/19/2022 0443   LYMPHSABS 1.3 08/18/2022 0406   MONOABS 0.6 08/18/2022 0406   EOSABS 0.1 08/18/2022 0406   BASOSABS 0.0 08/18/2022 0406    CMP     Component Value Date/Time   NA 138 08/19/2022 0443   K 3.6 08/19/2022 0443   CL 109 08/19/2022 0443   CO2 24 08/19/2022 0443   GLUCOSE 98 08/19/2022 0443   BUN 11 08/19/2022 0443   CREATININE 0.76 08/19/2022 0443   CREATININE 1.03 (H) 05/22/2020 0804   CALCIUM 9.1 08/19/2022 0443   PROT 6.9 08/13/2022 1715   ALBUMIN 4.1 08/13/2022 1715   AST 22 08/13/2022 1715   AST 17 05/22/2020 0804   ALT 17 08/13/2022 1715   ALT 11 05/22/2020 0804   ALKPHOS 95 08/13/2022 1715   BILITOT 0.6 08/13/2022 1715   BILITOT 0.3 05/22/2020  0804   GFRNONAA >60 08/19/2022 0443   GFRNONAA 52 (L) 05/22/2020 0804   GFRAA 60 (L) 05/22/2020 0804    glycosylated hemoglobin-5.7 LDL 86  Imaging I have reviewed images in epic and the results pertinent to this consultation are:  MR of the brain 10/19: Scattered acute to early subacute infarcts in the posterior right frontoparietal region, left cerebellum and left temporal occipital region with minimal petechial blood products at the left cerebellum without hemorrhagic transformation. For additional punctate foci of acute early to subacute ischemia noted within left temporal occipital region which were not seen on the prior scan.  Small volume acute to early subacute right frontoparietal region which is also  new from before.   MRI brain 10/23, personally reviewed, agree with radiology:     1. Interval extension of infarction in the right hemisphere, now seen affecting the deep insula and more of the frontoparietal cortical and subcortical brain. This is consistent with progressive right MCA branch vessel infarction. No evidence of hemorrhage or mass effect. 2. Newly seen subcentimeter focus of acute infarction in the left frontal white matter. 3. Some normalization of diffusion at the location of acute infarctions previously seen in the left posterior temporal lobe and left cerebellum. 4. Chronic small-vessel ischemic changes elsewhere throughout the brain as outlined above.  CTA head and neck from last admission showed short segment severe stenosis/occlusion of the left P1 segment with reconstitution.  Severe stenosis or short segment occlusion of the proximal left P3.  Moderate stenosis of the distal basilar artery.  Mild bilateral carotid bifurcation atherosclerosis without significant stenosis.  CTA Chest negative for PE   Lower extremity venous Dopplers did not reveal any evidence of DVT at that time.   2D echocardiogram showed LVEF 60 to 65%, normal mitral valve, normal  aortic valve, normal left atrial size and no atrial level shunt with bubble study.  Assessment:  81 year old with past history of breast and uterine cancer presenting last week with vertigo and found to have recurrent multifocal acute ischemic strokes in the anterior and posterior circulation ongoing despite DAPT  While sarcoma is not typically associated with high risk of hypercoagulable state, uterine carcinoma on further review of the literature is Gynecol Oncol. 2018 Feb;148(2):267-274. doi: [email or web address].  In discussion w/ patient, family and oncology all are in agreement that risk of AC is outweighed by beneift at this time.  Workup summary: Labs show elevated LDL for which she is on statin (86).  A1c is within goal (5.7%).  CT angiography of the head and neck did not reveal any significant stenosis in the anterior circulation although she has multiple stenoses in the posterior circulation. Stroke etiology remains cryptogenic with strong suspicion for embolic etiology, but event monitor here negative to date even with worsening exam c/f additional events during hospitalization. Transthoracic echo was unremarkable.  Lower extremity Dopplers negative for DVT.  CTA chest negative for PE TEE was done which showed no evidence of any left atrial left appendage or left ventricular thrombi with bubble study being negative.  Hypercoagulable labs ordered by oncology -they are pending at this time unless result listed below  Protein S 83% (ref 60-150%), Protein C 92, ATIII 106, beta-2-glycoprotein IgG/M/A neg, Anti-phospholipid syndrome profile neg,  Pending:  Factor 5 Leiden, Prothrombin gene mutation, D-Dimer 10.2 CA-125 46.2 (3 months ago 12.6)  Impression: Recurrent acute ischemic stroke in multiple vascular territories - increasing concern that etiology is hypercoagulability of malignancy given elevated D-Dimer and uptrending CA-125 and ongoing ischemic events  History of breast  cancer and aggressive form of uterine cancer with uptrending markers.   Recommendations: - apixaban 5 mg BID, d/c heparin drip - continue ASA 81 mg for CAD history - goal Normotension BP - continue statin, goal LDL < 70 - Neurology will be available as needed, please reach out if new questions or concerns arise  Lesleigh Noe MD-PhD [facility] [phone]  [facility] coverage for [facility] is from 8 AM to 4 AM in-house and 4 PM to 8 PM by telephone/video. 8 PM to 8 AM emergent questions or overnight urgent questions should be addressed to Teleneurology On-call or [facility] neurohospitalist; contact information can be found on  AMION  Greater than 50 min spent in care of patient, majority at bedside.

## 2022-08-19 NOTE — Progress Notes (Signed)
[place] for IV Heparin Indication: DVT / VTE Treatment  Patient Measurements: Height: '5\' 2"'$  (157.5 cm) Weight: 46.3 kg (102 lb) IBW/kg (Calculated) : 50.1 Heparin Dosing Weight: 46.3 kg  Labs: Recent Labs    08/17/22 1643 08/18/22 0406 08/18/22 0942 08/18/22 1959 08/19/22 0443  HGB 9.4* 9.6*  --   --  9.8*  HCT 27.2* 28.2*  --   --  28.9*  PLT 127* 129*  --   --  140*  APTT  --   --  27  --   --   LABPROT  --   --  15.6*  --   --   INR  --   --  1.3*  --   --   HEPARINUNFRC  --   --   --  0.22* 0.35     Estimated Creatinine Clearance: 36 mL/min (by C-G formula based on SCr of 0.88 mg/dL).   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[place])    Ductal carcinoma in situ (DCIS) of right breast    Fibrocystic breast disease    GERD (gastroesophageal reflux disease)    Hearing aid worn    bilateral   History of hiatal hernia    Hyperlipidemia    Lung nodule    Menopause    Migraine    Migraine    optical migraines   Motion sickness    Plantar fasciitis    Prolapse of female pelvic organs    hodge pessary   Raynaud's disease    Sciatic leg pain    Sciatica    Skin cancer of face    Stroke [facility])    Urinary retention with incomplete bladder emptying    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 bASA inpatient, holding Plavix   Assessment: 81 year old female admitted with recurrent acute ischemic stroke. Past medical history includes breast cancer, uterine cancer. Concern for hypercoagulability of malignancy.  Elevated D-dimer   CTA: Similar distribution and appearance of tree-in-bud nodularities, greatest within the RIGHT lung base. Findings are likely to reflect an  infectious or inflammatory process.  [place] 0.22, subthera; 550 un/hr 1025 0443 HL 0.35, therapeutic x 1 @ 600 un/hr  Goal of Therapy:  Heparin level 0.3-0.5 units/ml Monitor platelets by anticoagulation protocol: Yes   Plan:  --Heparin level is therapeutic --Continue heparin infusion at 600 units/hr --Check confirmatory HL in 8 hours --Daily CBC per protocol while on IV heparin   Ernan Runkles A Erik Burkett 08/19/2022,5:15 AM

## 2022-08-19 NOTE — Progress Notes (Signed)
PROGRESS NOTE    Elizabeth Friedman  WGN:[id number] DOB: Jun 29, 1941 DOA: 08/13/2022 PCP: Adin Hector, MD   Brief Narrative: 81 year old with past medical history significant for recurrent CVA, dyslipidemia, GERD, osteoarthritis, vitamin B12 deficiency, breast and uterine cancer who was recently admitted for stroke on 10/13 who presents now with dizziness, right thigh paresthesia and tingling, she wake up the morning of admission with the symptoms.  She has been taking aspirin and Plavix.  MRI confirmed early subacute infarct involving the posterior right frontal parietal region, left cerebellum and left temporal occipital region.  Minimal petechial blood products at the level of the cerebellum without hemorrhagic transformation or significant regional mass effect.  Patient admitted with recurrent stroke, while in the hospital she develop further neurological deficits and recurrent stroke.  Due to increased concern for hypercoagulability in the setting of malignancy, an elevated D-dimer and uptrending CEA 125 neurology and oncology agreed that benefits outweighed risk and patient was a started on anticoagulation.  Patient will be started on low-dose heparin, if she tolerates heparin plan is to transition to Eliquis.  Patient will require inpatient rehab admission after she is transition to eliquis.   Hypercoagulable work-up negative so far, pending factor V Leyden and prothrombin gene mutation.  Assessment & Plan:   Principal Problem:   Acute CVA (cerebrovascular accident) [facility]) Active Problems:   Dyslipidemia   GERD without esophagitis   Migraine   CVA (cerebral vascular accident) ([place])  1-Acute CVA: Recurrent's Stroke. Concern for hypercoagulability in setting of malignancy  -Recurrent strokes on aspirin and Plavix. -Patient presented with dizziness and right-sided paresthesia. -MRI: Scattered acute to early subacute infarct involving the posterior right frontoparietal region, left  cerebellum and left temporal occipital region. -Underwent TEE negative. -10/22: Patient develops fine motor deficit, left hand decreased grip, left side drift , patient notice deficit at 1;15 am.  -10-23: notice to have new worsening left LE weakness.  -Repeated MRI 10/23: Interval extension and infarction in the right hemisphere, now affecting the deep insula and more of the frontoparietal cortical and subcortical brain.  Consistent with progressive right MCA branch vessel infarct.  Newly seen subcentimeter focus of acute infarct in the left frontal white matter.. -Due to concern for hypercoagulability, in the setting of malignancy, elevated D-dimer, recurrent stroke, no evidence of A-fib, patient was a started on low rate heparin drip, goal to transition to Eliquis if she is able to tolerate low rate heparin. -Plavix discontinue, continue aspirin per neurology -Started Norvasc to controlled BP.   Dyslipidemia: Continue with Lipitor  GERD: Continue with PPI  Migraine: On Topamax  Bronchiectasis. Suppose to be on Azithromycin for chronic bronchiectasis for 3 month TIW per Dr Lanney Gins note 06/2022. I have resume Azithromycin.   History of uterine and breast cancer: Oncology is consulted to provide input in regards hypercoagulability in the setting of recurrent stroke. -Outpatient follow-up for further management.  Protein calorie malnutrition-severe Estimated body mass index is 18.66 kg/m as calculated from the following:   Height as of this encounter: '5\' 2"'$  (1.575 m).   Weight as of this encounter: 46.3 kg.  -Dietitian consult  DVT prophylaxis: Eliquis Code Status: DNR Family Communication: Discussed with son at bedside  Disposition Plan: CIR Status is: Inpatient The patient will require care spanning > 2 midnights and should be moved to inpatient because: Recurrent stroke. CIR evaluation.     Consultants:  Neurology Oncology  Procedures:    Antimicrobials:     Subjective: Patient was seen  and examined today.  She was sitting in chair with no new complaints or deficits.  Son at bedside.   Objective: Vitals:   08/18/22 1553 08/18/22 2004 08/19/22 0041 08/19/22 0448  BP: (!) 151/81 137/73 132/73 136/72  Pulse: 75 71 62 66  Resp: '17 18 18 16  '$ Temp: 99.1 F (37.3 C) 98.7 F (37.1 C) 98.2 F (36.8 C) 98.5 F (36.9 C)  TempSrc:      SpO2: 99% 97% 97% 99%  Weight:      Height:        Intake/Output Summary (Last 24 hours) at 08/19/2022 0754 Last data filed at 08/18/2022 1405 Gross per 24 hour  Intake 907.25 ml  Output 350 ml  Net 557.25 ml    Filed Weights   08/14/22 1449 08/17/22 0802  Weight: 50.4 kg 46.3 kg    Examination:  General.  Frail and malnourished elderly lady, in no acute distress. Pulmonary.  Lungs clear bilaterally, normal respiratory effort. CV.  Regular rate and rhythm, no JVD, rub or murmur. Abdomen.  Soft, nontender, nondistended, BS positive. CNS.  Alert and oriented .  No new focal neurologic deficit. Extremities.  No edema, no cyanosis, pulses intact and symmetrical. Psychiatry.  Judgment and insight appears normal.   Data Reviewed: I have personally reviewed following labs and imaging studies  CBC: Recent Labs  Lab 08/13/22 1715 08/17/22 1643 08/18/22 0406 08/19/22 0443  WBC 4.6 7.1 5.4 5.9  NEUTROABS 2.8  --  3.4  --   HGB 11.0* 9.4* 9.6* 9.8*  HCT 32.5* 27.2* 28.2* 28.9*  MCV 97.6 96.8 96.9 95.7  PLT 125* 127* 129* 140*    Basic Metabolic Panel: Recent Labs  Lab 08/13/22 1715 08/16/22 0432 08/19/22 0443  NA 131* 138 138  K 4.1 3.6 3.6  CL 100 111 109  CO2 '23 23 24  '$ GLUCOSE 91 99 98  BUN '18 14 11  '$ CREATININE 0.87 0.88 0.76  CALCIUM 9.2 9.2 9.1    GFR: Estimated Creatinine Clearance: 39.6 mL/min (by C-G formula based on SCr of 0.76 mg/dL). Liver Function Tests: Recent Labs  Lab 08/13/22 1715  AST 22  ALT 17  ALKPHOS 95  BILITOT 0.6  PROT 6.9  ALBUMIN 4.1    No  results for input(s): "LIPASE", "AMYLASE" in the last 168 hours. No results for input(s): "AMMONIA" in the last 168 hours. Coagulation Profile: Recent Labs  Lab 08/15/22 0909 08/18/22 0942  INR 1.2 1.3*    Cardiac Enzymes: No results for input(s): "CKTOTAL", "CKMB", "CKMBINDEX", "TROPONINI" in the last 168 hours. BNP (last 3 results) No results for input(s): "PROBNP" in the last 8760 hours. HbA1C: No results for input(s): "HGBA1C" in the last 72 hours. CBG: Recent Labs  Lab 08/16/22 0831  GLUCAP 85    Lipid Profile: No results for input(s): "CHOL", "HDL", "LDLCALC", "TRIG", "CHOLHDL", "LDLDIRECT" in the last 72 hours.  Thyroid Function Tests: No results for input(s): "TSH", "T4TOTAL", "FREET4", "T3FREE", "THYROIDAB" in the last 72 hours. Anemia Panel: No results for input(s): "VITAMINB12", "FOLATE", "FERRITIN", "TIBC", "IRON", "RETICCTPCT" in the last 72 hours. Sepsis Labs: No results for input(s): "PROCALCITON", "LATICACIDVEN" in the last 168 hours.  No results found for this or any previous visit (from the past 240 hour(s)).        Radiology Studies: CT Angio Chest Pulmonary Embolism (PE) W or WO Contrast  Result Date: 08/17/2022 CLINICAL DATA:  Pulmonary embolism (PE) suspected, high prob EXAM: CT ANGIOGRAPHY CHEST WITH CONTRAST TECHNIQUE: Multidetector CT imaging  of the chest was performed using the standard protocol during bolus administration of intravenous contrast. Multiplanar CT image reconstructions and MIPs were obtained to evaluate the vascular anatomy. RADIATION DOSE REDUCTION: This exam was performed according to the departmental dose-optimization program which includes automated exposure control, adjustment of the mA and/or kV according to patient size and/or use of iterative reconstruction technique. CONTRAST:  21m OMNIPAQUE IOHEXOL 350 MG/ML SOLN COMPARISON:  Chest XR, 08/08/2022. CT chest, 08/07/2022. NM V/Q scan, 08/08/2022 FINDINGS: Cardiovascular:  Satisfactory opacification of the pulmonary arteries to the segmental level. No evidence of pulmonary embolism. Normal heart size. No pericardial effusion. Mediastinum/Nodes: No enlarged mediastinal, hilar, or axillary lymph nodes. Thyroid gland, trachea, and esophagus demonstrate no significant findings. Lungs/Pleura: No focal consolidation or mass. Similar distribution and appearance of tree-in-bud nodularities, greatest within the RIGHT lung base. LEFT basilar calcified granuloma. No pleural effusion or pneumothorax. Upper Abdomen: No acute abnormality. Punctate calcified granulomas within liver and spleen, likely sequelae of prior granulomatous disease. Musculoskeletal: No chest wall abnormality. No acute or significant osseous findings. Review of the MIP images confirms the above findings. IMPRESSION: 1.  No segmental or larger pulmonary embolus. 2. Similar distribution and appearance of tree-in-bud nodularities, greatest within the RIGHT lung base. Findings are likely to reflect an infectious or inflammatory process. 3. Punctate calcified granulomas within chest and abdomen, likely sequelae of prior granulomatous disease. Electronically Signed   By: JMichaelle BirksM.D.   On: 08/17/2022 16:42   MR CERVICAL SPINE W WO CONTRAST  Result Date: 08/17/2022 CLINICAL DATA:  Ataxia.  Left-sided weakness.  Stroke. EXAM: MRI CERVICAL SPINE WITHOUT AND WITH CONTRAST TECHNIQUE: Multiplanar and multiecho pulse sequences of the cervical spine, to include the craniocervical junction and cervicothoracic junction, were obtained without and with intravenous contrast. CONTRAST:  519mGADAVIST GADOBUTROL 1 MMOL/ML IV SOLN COMPARISON:  None Available. FINDINGS: Alignment: Normal Vertebrae: No fracture or focal lesion. Cord: No cord insult.  No cord compression. Posterior Fossa, vertebral arteries, paraspinal tissues: See results of brain MRI. Disc levels: The foramen magnum is widely patent. There is ordinary mild osteoarthritis  of the C1-2 articulation but no encroachment upon the neural structures. C2-3: Facet osteoarthritis left more than right. No compressive stenosis. C3-4: Bulging of the disc. Facet osteoarthritis left more than right. Moderate left foraminal narrowing. C4-5: Spondylosis with endplate osteophytes and bulging of the disc. No cord compression. Moderate bilateral foraminal narrowing. C5-6: Spondylosis with endplate osteophytes and bulging of the disc. No cord compression. Moderate bilateral foraminal narrowing. C6-7: Small right paracentral disc protrusion. No neural compression. C7-T1: Mild facet osteoarthritis.  No stenosis. IMPRESSION: 1. No cord insult. 2. Degenerative spondylosis from C3-4 through C5-6. No cord compression. Foraminal narrowing that could possibly be symptomatic on the left at C3-4 and bilateral at C4-5 and C5-6. Electronically Signed   By: MaNelson Chimes.D.   On: 08/17/2022 14:13   MR BRAIN W WO CONTRAST  Result Date: 08/17/2022 CLINICAL DATA:  Follow-up stroke.  Right-sided weakness. EXAM: MRI HEAD WITHOUT AND WITH CONTRAST TECHNIQUE: Multiplanar, multiecho pulse sequences of the brain and surrounding structures were obtained without and with intravenous contrast. CONTRAST:  67m71mADAVIST GADOBUTROL 1 MMOL/ML IV SOLN COMPARISON:  Head CT yesterday.  MRI 08/13/2022. FINDINGS: Brain: Small areas of acute infarction previously seen in the left cerebellum are normalizing on diffusion imaging. No new cerebellar or brainstem insult. There has been extension of the region of infarction in the right hemisphere, now seen affecting the deep insula and more of  the frontoparietal cortical and subcortical brain. This is consistent with progressive right MCA branch vessel infarction. Newly seen subcentimeter focus of acute infarction in the left frontal white matter. Normalization of diffusion at the location of punctate acute infarctions previously seen in the left posterior temporal lobe. No evidence of  hemorrhage or mass effect. Elsewhere, chronic ischemic changes of the pons, thalami, basal ganglia and hemispheric white matter are stable. No hydrocephalus. No extra-axial collection. After contrast administration, some foci of enhancement are notable in the regions of infarction in the left cerebellum and right frontoparietal region. Vascular: Major vessels at the base of the brain show flow. Skull and upper cervical spine: Negative Sinuses/Orbits: Clear/normal Other: None IMPRESSION: 1. Interval extension of infarction in the right hemisphere, now seen affecting the deep insula and more of the frontoparietal cortical and subcortical brain. This is consistent with progressive right MCA branch vessel infarction. No evidence of hemorrhage or mass effect. 2. Newly seen subcentimeter focus of acute infarction in the left frontal white matter. 3. Some normalization of diffusion at the location of acute infarctions previously seen in the left posterior temporal lobe and left cerebellum. 4. Chronic small-vessel ischemic changes elsewhere throughout the brain as outlined above. Electronically Signed   By: Nelson Chimes M.D.   On: 08/17/2022 14:10   ECHO TEE  Result Date: 08/17/2022    TRANSESOPHOGEAL ECHO REPORT   Patient Name:   Elizabeth Friedman Date of Exam: 08/17/2022 Medical Rec #:  [id number]    Height:       62.0 in Accession #:    [id number]   Weight:       102.0 lb Date of Birth:  06/25/1941   BSA:          1.436 m Patient Age:    65 years     BP:           140/75 mmHg Patient Gender: F            HR:           72 bpm. Exam Location:  [facility] Procedure: Transesophageal Echo, Cardiac Doppler, Color Doppler and Saline            Contrast Bubble Study Indications:     Acute CVA  History:         Patient has prior history of Echocardiogram examinations, most                  recent 08/10/2022. Stroke. Atypical chest pain , Breast cancer.  Sonographer:     Sherrie Sport Referring Phys:  [id number] [facility] A [place] Diagnosing  Phys: Neoma Laming PROCEDURE: The transesophogeal probe was passed without difficulty through the esophogus of the patient. Sedation performed by performing physician. The patient developed no complications during the procedure. IMPRESSIONS  1. Left ventricular ejection fraction, by estimation, is 65 to 70%. The left ventricle has normal function.  2. Right ventricular systolic function is normal. The right ventricular size is normal.  3. Left atrial size was mildly dilated. No left atrial/left atrial appendage thrombus was detected.  4. Right atrial size was mildly dilated.  5. The mitral valve is grossly normal. Trivial mitral valve regurgitation.  6. The aortic valve is calcified. Aortic valve regurgitation is trivial.  7. Agitated saline contrast bubble study was negative, with no evidence of any interatrial shunt. Conclusion(s)/Recommendation(s): No LA/LAA thrombus identified. Negative bubble study for interatrial shunt. No intracardiac source of embolism detected on this on this transesophageal echocardiogram. FINDINGS  Left Ventricle: Left ventricular ejection fraction, by estimation, is 65 to 70%. The left ventricle has normal function. The left ventricular internal cavity size was normal in size. Right Ventricle: The right ventricular size is normal. No increase in right ventricular wall thickness. Right ventricular systolic function is normal. Left Atrium: Left atrial size was mildly dilated. No left atrial/left atrial appendage thrombus was detected. Right Atrium: Right atrial size was mildly dilated. Pericardium: There is no evidence of pericardial effusion. Mitral Valve: The mitral valve is grossly normal. Trivial mitral valve regurgitation. Tricuspid Valve: The tricuspid valve is grossly normal. Tricuspid valve regurgitation is mild. Aortic Valve: The aortic valve is calcified. Aortic valve regurgitation is trivial. Pulmonic Valve: The pulmonic valve was normal in structure. Pulmonic valve  regurgitation is trivial. Aorta: The aortic root, ascending aorta and aortic arch are all structurally normal, with no evidence of dilitation or obstruction. IAS/Shunts: No atrial level shunt detected by color flow Doppler. Agitated saline contrast was given intravenously to evaluate for intracardiac shunting. Agitated saline contrast bubble study was negative, with no evidence of any interatrial shunt. [place] Electronically signed by Neoma Laming Signature Date/Time: 08/17/2022/9:33:10 AM    Final         Scheduled Meds:  amLODipine  5 mg Oral Daily   ascorbic acid  250 mg Oral Daily   aspirin  81 mg Oral Daily   atorvastatin  40 mg Oral Daily   azithromycin  500 mg Oral Once per day on Mon Wed Fri   cholecalciferol  5,000 Units Oral Q breakfast   vitamin B-12  500 mcg Oral Daily   cycloSPORINE  1 drop Both Eyes BID   Ferrous Fumarate  1 tablet Oral Daily   gabapentin  400 mg Oral BID   pantoprazole  40 mg Oral Q breakfast   polyvinyl alcohol  1 drop Both Eyes QHS   topiramate  50 mg Oral QPM   vitamin E  400 Units Oral Q breakfast   Continuous Infusions:  heparin 600 Units/hr (08/18/22 2109)     LOS: 5 days    Time spent: 40 Minutes.   This record has been created using Systems analyst. Errors have been sought and corrected,but may not always be located. Such creation errors do not reflect on the standard of care.   Lorella Nimrod, MD [facility]   If 7PM-7AM, please contact night-coverage [email or web address]  08/19/2022, 7:54 AM

## 2022-08-19 NOTE — Progress Notes (Signed)
Physical Therapy Treatment Patient Details Name: Elizabeth Friedman MRN: [id number] DOB: January 18, 1941 Today's Date: 08/19/2022   History of Present Illness Pt is an 81 year old female admitted  with recurrent subacute to acute CVA, concerning for central involvement source after presenting to ED with dizziness and right-sided paresthesias; PMH significant for past history of breast and uterine cancer, recurrent CVA, dyslipidemia, GERD, Osteoarthritis, vitamin B12 deficiency and migraine. 10/24 update: Pt now with confirmed extension of CVA, new LUE deficits and L visual field deficits, new balance deficits.    PT Comments    Pt is making good progress towards goals with improved L UE control/mobility. Still demonstrates with moderate L side neglect, however with cues is able to focus attention to L side. Able to ambulate slightly further this date, requires cues for sequencing and fluid gait pattern. Pt continues to be very motivated to participate. Will continue to progress. Family at bedside very supportive.  Recommendations for follow up therapy are one component of a multi-disciplinary discharge planning process, led by the attending physician.  Recommendations may be updated based on patient status, additional functional criteria and insurance authorization.  Follow Up Recommendations  Acute inpatient rehab (3hours/day) Can patient physically be transported by private vehicle: Yes   Assistance Recommended at Discharge Intermittent Supervision/Assistance  Patient can return home with the following A lot of help with walking and/or transfers;A lot of help with bathing/dressing/bathroom;Assist for transportation;Help with stairs or ramp for entrance   Equipment Recommendations   (TBD)    Recommendations for Other Services       Precautions / Restrictions Precautions Precautions: Fall Precaution Comments: Also, pt needs cues to attend to LUE during transfers/mobility. Restrictions Weight  Bearing Restrictions: No     Mobility  Bed Mobility Overal bed mobility: Needs Assistance Bed Mobility: Sit to Supine       Sit to supine: Min guard   General bed mobility comments: cues for sequencing and attention to L side.    Transfers Overall transfer level: Needs assistance Equipment used: Rolling walker (2 wheels) Transfers: Sit to/from Stand Sit to Stand: Min guard           General transfer comment: responds well to cues for pushing from seated surface. Needs min assist for L hand to place on RW, however is then able to grip handle. Once standing, tends to weight shift to R and needs frequent cues for attention to L hemibody.    Ambulation/Gait Ambulation/Gait assistance: Min guard, Min assist Gait Distance (Feet): 60 Feet Assistive device: Rolling walker (2 wheels) Gait Pattern/deviations: Step-to pattern, Decreased step length - right, Decreased step length - left       General Gait Details: follows commands well with inconsistencies noted including 1 instance of scissoring gait. Still demonstrates narrow BOS. Very slow gait speed and cues to keep RW close to body. RW used with tactile cues for L knee extension during stance time due to L LE buckling.   Stairs             Wheelchair Mobility    Modified Rankin (Stroke Patients Only)       Balance Overall balance assessment: Needs assistance Sitting-balance support: Feet supported, Bilateral upper extremity supported Sitting balance-Leahy Scale: Good     Standing balance support: Bilateral upper extremity supported Standing balance-Leahy Scale: Fair  Cognition Arousal/Alertness: Awake/alert Behavior During Therapy: WFL for tasks assessed/performed Overall Cognitive Status: Within Functional Limits for tasks assessed                                 General Comments: great personality, great carryover from previous session, however does  demonstrate divided attention with distractions.        Exercises Other Exercises Other Exercises: standing ther-ex performed including grabbing weighted water bottle and bottle deodorant and moving across tray past midline. Multiple reps performed with cues for L knee extension. Other Exercises: ther-ex performed including sit<>Stands with L hand placed on RW and R hand on chair. Able to perform 5 reps with cga. Also performed standing alt marching with decreased proprioception on L LE. 10 reps performed. Other Exercises: ambulated to bathroom with min assist. Needs mod assist for doffing mesh panties and safe transfers. Has difficulty with self hygiene.    General Comments        Pertinent Vitals/Pain Pain Assessment Pain Assessment: No/denies pain    Home Living                          Prior Function            PT Goals (current goals can now be found in the care plan section) Acute Rehab PT Goals Patient Stated Goal: get back home PT Goal Formulation: With patient Time For Goal Achievement: 08/28/22 Potential to Achieve Goals: Good Progress towards PT goals: Progressing toward goals    Frequency    7X/week      PT Plan Current plan remains appropriate    Co-evaluation              AM-PAC PT "6 Clicks" Mobility   Outcome Measure  Help needed turning from your back to your side while in a flat bed without using bedrails?: A Little Help needed moving from lying on your back to sitting on the side of a flat bed without using bedrails?: A Little Help needed moving to and from a bed to a chair (including a wheelchair)?: A Little Help needed standing up from a chair using your arms (e.g., wheelchair or bedside chair)?: A Little Help needed to walk in hospital room?: A Little Help needed climbing 3-5 steps with a railing? : Total 6 Click Score: 16    End of Session Equipment Utilized During Treatment: Gait belt Activity Tolerance: Patient  tolerated treatment well Patient left: in bed;with bed alarm set Nurse Communication: Mobility status PT Visit Diagnosis: Muscle weakness (generalized) (M62.81);Ataxic gait (R26.0);Difficulty in walking, not elsewhere classified (R26.2);Unsteadiness on feet (R26.81)     Time: 0093-8182 PT Time Calculation (min) (ACUTE ONLY): 43 min  Charges:  $Gait Training: 8-22 mins $Therapeutic Exercise: 8-22 mins $Therapeutic Activity: 8-22 mins                     Greggory Stallion, PT, DPT, GCS [phone]    Pearlie Lafosse 08/19/2022, 3:42 PM

## 2022-08-19 NOTE — Progress Notes (Signed)
Occupational Therapy Treatment Patient Details Name: Elizabeth Friedman MRN: [id number] DOB: Mar 01, 1941 Today's Date: 08/19/2022   History of present illness Pt is an 81 year old female admitted  with recurrent subacute to acute CVA, concerning for central involvement source after presenting to ED with dizziness and right-sided paresthesias; PMH significant for past history of breast and uterine cancer, recurrent CVA, dyslipidemia, GERD, Osteoarthritis, vitamin B12 deficiency and migraine. 10/24 update: Pt now with confirmed extension of CVA, new LUE deficits and L visual field deficits, new balance deficits.   OT comments  Pt received seated in recliner with BIL LE elevated; son in room and supportive throughout session. Appearing alert and ready for therapy; willing to work with OT on ADLs with integrated use of LUE and compensatory techniques. T/f CGA with RW. See flowsheet below for further details of session. Left seated in recliner, BIL LE elevated, son and friend in room; with all needs in reach.     Recommendations for follow up therapy are one component of a multi-disciplinary discharge planning process, led by the attending physician.  Recommendations may be updated based on patient status, additional functional criteria and insurance authorization.    Follow Up Recommendations  Acute inpatient rehab (3hours/day)    Assistance Recommended at Discharge Frequent or constant Supervision/Assistance  Patient can return home with the following  A lot of help with walking and/or transfers;A lot of help with bathing/dressing/bathroom;Assistance with cooking/housework;Direct supervision/assist for medications management;Direct supervision/assist for financial management;Assist for transportation;Help with stairs or ramp for entrance   Equipment Recommendations  Other (comment) (defer to next level of care)    Recommendations for Other Services      Precautions / Restrictions  Precautions Precautions: Fall Precaution Comments: Also, pt needs cues to attend to LUE during transfers/mobility. Restrictions Weight Bearing Restrictions: No       Mobility Bed Mobility                    Transfers Overall transfer level: Needs assistance Equipment used: Rolling walker (2 wheels) Transfers: Sit to/from Stand Sit to Stand: Min guard, Min assist           General transfer comment: Pt able to walk from recliner around the bed to other side of bed where sink located with RW CGA-MIN A for balance. Pt needed seated rest break on EOB prior to standing at sink, and well as after grooming activity prior to walking around bed and back to chair.     Balance Overall balance assessment: Needs assistance Sitting-balance support: Feet supported, Bilateral upper extremity supported                                       ADL either performed or assessed with clinical judgement   ADL       Grooming: Oral care;Cueing for compensatory techniques;Minimal assistance;Standing (CGA-MOD A standing balance at sink today x10 minutes) Grooming Details (indicate cue type and reason): Pt requiring cues for adaptive techniques at sink; pt statically holding top of toothpaste with L fingers and twisting entire tube with R hand. Pt attempting to squeeze tube with L hand grip to place toothpaste onto brush (which she was holding in R hand); L grip not strong enough to expel toothpaste; OT suggested placing tube on the countertop with opening over sink and pushing down with L hand, which pt did successfully. Pt required OT verbal cues  for moving toothpaste tube with L hand and then releasing with L hand and placing L hand on countertop for balance assist. While standing and brushing, pt leaning forward and L, requiring CGA and eventually MOD A as she fatigued. Able to stand for total of 10 minutes.                                    Extremity/Trunk  Assessment Upper Extremity Assessment Upper Extremity Assessment: LUE deficits/detail LUE Deficits / Details: Continued LUE proprioception deficits in L hand/fingers; appears intact at wrist and elbow. Sensation appears inact. Significant LUE coordination deficits in [facility] [place]. Delayed response in LUE. Moderate L neglect of LUE. LUE Coordination: decreased fine motor;decreased gross motor   Lower Extremity Assessment Lower Extremity Assessment: Defer to PT evaluation        Vision   Vision Assessment?:  (continued L visual deficits)   Perception     Praxis      Cognition Arousal/Alertness: Awake/alert Behavior During Therapy: WFL for tasks assessed/performed Overall Cognitive Status: Within Functional Limits for tasks assessed                                 General Comments: good carryover from previous session; is social and distractable at times when family/friends in the room.        Exercises Exercises: Other exercises (Reviewed grip/pinch exercises from yesterday; encouraged pt to have visitors to stand on her L side to encourage her to look L.)    Shoulder Instructions       General Comments      Pertinent Vitals/ Pain       Pain Assessment Pain Assessment: No/denies pain  Home Living                                          Prior Functioning/Environment              Frequency  Min 3X/week (up to 7x/week)        Progress Toward Goals  OT Goals(current goals can now be found in the care plan section)  Progress towards OT goals: Progressing toward goals  Acute Rehab OT Goals Patient Stated Goal: Get to rehab to work on getting better OT Goal Formulation: With patient/family Time For Goal Achievement: 08/28/22 Potential to Achieve Goals: Good ADL Goals Pt Will Perform Grooming: with modified independence Pt Will Perform Lower Body Dressing: with modified independence Pt Will Transfer to Toilet: with modified  independence Pt Will Perform Toileting - Clothing Manipulation and hygiene: with modified independence  Plan Discharge plan remains appropriate    Co-evaluation                 AM-PAC OT "6 Clicks" Daily Activity     Outcome Measure   Help from another person eating meals?: A Little Help from another person taking care of personal grooming?: A Little Help from another person toileting, which includes using toliet, bedpan, or urinal?: A Lot Help from another person bathing (including washing, rinsing, drying)?: A Lot Help from another person to put on and taking off regular upper body clothing?: A Little Help from another person to put on and taking off regular lower body clothing?: A Lot 6 Click  Score: 15    End of Session Equipment Utilized During Treatment: Rolling walker (2 wheels);Gait belt  OT Visit Diagnosis: Unsteadiness on feet (R26.81);Muscle weakness (generalized) (M62.81)   Activity Tolerance Patient tolerated treatment well   Patient Left in chair;with call bell/phone within reach;with family/visitor present   Nurse Communication Mobility status        Time: 1015-1101 OT Time Calculation (min): 46 min  Charges: OT General Charges $OT Visit: 1 Visit OT Treatments $Self Care/Home Management : 38-52 mins  Waymon Amato, MS, OTR/L   Vania Rea 08/19/2022, 1:22 PM

## 2022-08-20 ENCOUNTER — Encounter (HOSPITAL_COMMUNITY): Payer: Self-pay | Admitting: Physical Medicine & Rehabilitation

## 2022-08-20 ENCOUNTER — Inpatient Hospital Stay (HOSPITAL_COMMUNITY)
Admission: RE | Admit: 2022-08-20 | Discharge: 2022-08-22 | DRG: 056 | Disposition: A | Discharge status: Other Acute Inpatient Hospital | Payer: PPO | Source: Intra-hospital | Attending: Physical Medicine & Rehabilitation | Admitting: Physical Medicine & Rehabilitation

## 2022-08-20 ENCOUNTER — Other Ambulatory Visit: Payer: Self-pay

## 2022-08-20 DIAGNOSIS — J479 Bronchiectasis, uncomplicated: Secondary | ICD-10-CM | POA: Diagnosis present

## 2022-08-20 DIAGNOSIS — I639 Cerebral infarction, unspecified: Secondary | ICD-10-CM | POA: Diagnosis not present

## 2022-08-20 DIAGNOSIS — Z79899 Other long term (current) drug therapy: Secondary | ICD-10-CM | POA: Diagnosis not present

## 2022-08-20 DIAGNOSIS — Z853 Personal history of malignant neoplasm of breast: Secondary | ICD-10-CM | POA: Diagnosis not present

## 2022-08-20 DIAGNOSIS — Z800 Family history of malignant neoplasm of digestive organs: Secondary | ICD-10-CM

## 2022-08-20 DIAGNOSIS — E785 Hyperlipidemia, unspecified: Secondary | ICD-10-CM | POA: Diagnosis not present

## 2022-08-20 DIAGNOSIS — I69393 Ataxia following cerebral infarction: Secondary | ICD-10-CM | POA: Diagnosis not present

## 2022-08-20 DIAGNOSIS — I69354 Hemiplegia and hemiparesis following cerebral infarction affecting left non-dominant side: Principal | ICD-10-CM

## 2022-08-20 DIAGNOSIS — Z681 Body mass index (BMI) 19 or less, adult: Secondary | ICD-10-CM

## 2022-08-20 DIAGNOSIS — M1990 Unspecified osteoarthritis, unspecified site: Secondary | ICD-10-CM | POA: Diagnosis present

## 2022-08-20 DIAGNOSIS — G43909 Migraine, unspecified, not intractable, without status migrainosus: Secondary | ICD-10-CM | POA: Diagnosis not present

## 2022-08-20 DIAGNOSIS — Z923 Personal history of irradiation: Secondary | ICD-10-CM

## 2022-08-20 DIAGNOSIS — R4182 Altered mental status, unspecified: Secondary | ICD-10-CM | POA: Diagnosis not present

## 2022-08-20 DIAGNOSIS — E43 Unspecified severe protein-calorie malnutrition: Secondary | ICD-10-CM | POA: Diagnosis not present

## 2022-08-20 DIAGNOSIS — I69390 Apraxia following cerebral infarction: Secondary | ICD-10-CM

## 2022-08-20 DIAGNOSIS — I69319 Unspecified symptoms and signs involving cognitive functions following cerebral infarction: Secondary | ICD-10-CM

## 2022-08-20 DIAGNOSIS — I63511 Cerebral infarction due to unspecified occlusion or stenosis of right middle cerebral artery: Secondary | ICD-10-CM | POA: Diagnosis not present

## 2022-08-20 DIAGNOSIS — Z803 Family history of malignant neoplasm of breast: Secondary | ICD-10-CM | POA: Diagnosis not present

## 2022-08-20 DIAGNOSIS — E538 Deficiency of other specified B group vitamins: Secondary | ICD-10-CM | POA: Diagnosis not present

## 2022-08-20 DIAGNOSIS — I69391 Dysphagia following cerebral infarction: Secondary | ICD-10-CM

## 2022-08-20 DIAGNOSIS — Z85828 Personal history of other malignant neoplasm of skin: Secondary | ICD-10-CM

## 2022-08-20 DIAGNOSIS — R569 Unspecified convulsions: Secondary | ICD-10-CM | POA: Diagnosis present

## 2022-08-20 DIAGNOSIS — R29702 NIHSS score 2: Secondary | ICD-10-CM | POA: Diagnosis not present

## 2022-08-20 DIAGNOSIS — Z8542 Personal history of malignant neoplasm of other parts of uterus: Secondary | ICD-10-CM | POA: Diagnosis not present

## 2022-08-20 DIAGNOSIS — I10 Essential (primary) hypertension: Secondary | ICD-10-CM | POA: Diagnosis not present

## 2022-08-20 DIAGNOSIS — R1310 Dysphagia, unspecified: Secondary | ICD-10-CM | POA: Diagnosis present

## 2022-08-20 DIAGNOSIS — K219 Gastro-esophageal reflux disease without esophagitis: Secondary | ICD-10-CM | POA: Diagnosis present

## 2022-08-20 DIAGNOSIS — D649 Anemia, unspecified: Secondary | ICD-10-CM | POA: Diagnosis present

## 2022-08-20 DIAGNOSIS — Z9221 Personal history of antineoplastic chemotherapy: Secondary | ICD-10-CM

## 2022-08-20 DIAGNOSIS — I7300 Raynaud's syndrome without gangrene: Secondary | ICD-10-CM | POA: Diagnosis not present

## 2022-08-20 DIAGNOSIS — Z8249 Family history of ischemic heart disease and other diseases of the circulatory system: Secondary | ICD-10-CM

## 2022-08-20 DIAGNOSIS — I69392 Facial weakness following cerebral infarction: Secondary | ICD-10-CM

## 2022-08-20 DIAGNOSIS — Z7902 Long term (current) use of antithrombotics/antiplatelets: Secondary | ICD-10-CM

## 2022-08-20 DIAGNOSIS — Z7982 Long term (current) use of aspirin: Secondary | ICD-10-CM

## 2022-08-20 LAB — FACTOR 5 LEIDEN

## 2022-08-20 LAB — CBC
HCT: 28.9 % — ABNORMAL LOW (ref 36.0–46.0)
Hemoglobin: 9.8 g/dL — ABNORMAL LOW (ref 12.0–15.0)
MCH: 33 pg (ref 26.0–34.0)
MCHC: 33.9 g/dL (ref 30.0–36.0)
MCV: 97.3 fL (ref 80.0–100.0)
Platelets: 140 10*3/uL — ABNORMAL LOW (ref 150–400)
RBC: 2.97 MIL/uL — ABNORMAL LOW (ref 3.87–5.11)
RDW: 11.9 % (ref 11.5–15.5)
WBC: 5.4 10*3/uL (ref 4.0–10.5)
nRBC: 0 % (ref 0.0–0.2)

## 2022-08-20 MED ORDER — VITAMIN C 500 MG PO TABS
250.0000 mg | ORAL_TABLET | Freq: Every day | ORAL | Status: DC
  Administered 2022-08-21: 250 mg via ORAL
  Filled 2022-08-20 (×2): qty 1

## 2022-08-20 MED ORDER — VITAMIN B-12 1000 MCG PO TABS
500.0000 ug | ORAL_TABLET | Freq: Every day | ORAL | Status: DC
  Administered 2022-08-21: 500 ug via ORAL
  Filled 2022-08-20 (×2): qty 1

## 2022-08-20 MED ORDER — CYCLOSPORINE 0.05 % OP EMUL
1.0000 [drp] | Freq: Two times a day (BID) | OPHTHALMIC | Status: DC
  Administered 2022-08-20 – 2022-08-21 (×3): 1 [drp] via OPHTHALMIC
  Filled 2022-08-20 (×5): qty 30

## 2022-08-20 MED ORDER — GABAPENTIN 400 MG PO CAPS
400.0000 mg | ORAL_CAPSULE | Freq: Two times a day (BID) | ORAL | Status: DC
  Administered 2022-08-20 – 2022-08-21 (×3): 400 mg via ORAL
  Filled 2022-08-20 (×4): qty 1

## 2022-08-20 MED ORDER — TOPIRAMATE 25 MG PO TABS
50.0000 mg | ORAL_TABLET | Freq: Every evening | ORAL | Status: DC
  Administered 2022-08-20 – 2022-08-21 (×2): 50 mg via ORAL
  Filled 2022-08-20 (×2): qty 2

## 2022-08-20 MED ORDER — VITAMIN E 180 MG (400 UNIT) PO CAPS
400.0000 [IU] | ORAL_CAPSULE | Freq: Every day | ORAL | Status: DC
  Administered 2022-08-21: 400 [IU] via ORAL
  Filled 2022-08-20 (×2): qty 1

## 2022-08-20 MED ORDER — POLYVINYL ALCOHOL 1.4 % OP SOLN
1.0000 [drp] | Freq: Every day | OPHTHALMIC | Status: DC
  Administered 2022-08-20 – 2022-08-21 (×3): 1 [drp] via OPHTHALMIC
  Filled 2022-08-20: qty 15

## 2022-08-20 MED ORDER — TRIAMCINOLONE ACETONIDE 55 MCG/ACT NA AERO
2.0000 | INHALATION_SPRAY | Freq: Every day | NASAL | Status: DC | PRN
  Filled 2022-08-20: qty 21.6

## 2022-08-20 MED ORDER — SENNOSIDES-DOCUSATE SODIUM 8.6-50 MG PO TABS: 1.0000 | ORAL_TABLET | Freq: Every evening | ORAL | Status: DC | PRN

## 2022-08-20 MED ORDER — ACETAMINOPHEN 500 MG PO TABS
1000.0000 mg | ORAL_TABLET | Freq: Three times a day (TID) | ORAL | Status: DC | PRN
  Filled 2022-08-20: qty 2

## 2022-08-20 MED ORDER — TRAZODONE HCL 50 MG PO TABS: 25.0000 mg | ORAL_TABLET | Freq: Every evening | ORAL | Status: DC | PRN

## 2022-08-20 MED ORDER — ACETAMINOPHEN 650 MG RE SUPP: 650.0000 mg | RECTAL | Status: DC | PRN

## 2022-08-20 MED ORDER — ASPIRIN 81 MG PO CHEW
81.0000 mg | CHEWABLE_TABLET | Freq: Every day | ORAL | Status: DC
  Administered 2022-08-21: 81 mg via ORAL
  Filled 2022-08-20 (×2): qty 1

## 2022-08-20 MED ORDER — ALUM & MAG HYDROXIDE-SIMETH 200-200-20 MG/5ML PO SUSP: 15.0000 mL | Freq: Four times a day (QID) | ORAL | Status: DC | PRN

## 2022-08-20 MED ORDER — ACETAMINOPHEN 160 MG/5ML PO SOLN: 650.0000 mg | ORAL | Status: DC | PRN

## 2022-08-20 MED ORDER — FERROUS FUMARATE 324 (106 FE) MG PO TABS
1.0000 | ORAL_TABLET | Freq: Every day | ORAL | Status: DC
  Administered 2022-08-21: 106 mg via ORAL
  Filled 2022-08-20 (×2): qty 1

## 2022-08-20 MED ORDER — ATORVASTATIN CALCIUM 40 MG PO TABS
40.0000 mg | ORAL_TABLET | Freq: Every day | ORAL | Status: DC
  Administered 2022-08-21: 40 mg via ORAL
  Filled 2022-08-20 (×2): qty 1

## 2022-08-20 MED ORDER — PANTOPRAZOLE SODIUM 40 MG PO TBEC
40.0000 mg | DELAYED_RELEASE_TABLET | Freq: Two times a day (BID) | ORAL | Status: DC
  Administered 2022-08-20 – 2022-08-22 (×4): 40 mg via ORAL
  Filled 2022-08-20 (×4): qty 1

## 2022-08-20 MED ORDER — AMLODIPINE BESYLATE 5 MG PO TABS
5.0000 mg | ORAL_TABLET | Freq: Every day | ORAL | Status: DC
  Administered 2022-08-21: 5 mg via ORAL
  Filled 2022-08-20 (×2): qty 1

## 2022-08-20 MED ORDER — PANTOPRAZOLE SODIUM 40 MG PO TBEC: 40.0000 mg | DELAYED_RELEASE_TABLET | Freq: Every day | ORAL | Status: DC

## 2022-08-20 MED ORDER — APIXABAN 5 MG PO TABS
5.0000 mg | ORAL_TABLET | Freq: Two times a day (BID) | ORAL | Status: DC
  Administered 2022-08-20 – 2022-08-21 (×3): 5 mg via ORAL
  Filled 2022-08-20 (×4): qty 1

## 2022-08-20 MED ORDER — AZITHROMYCIN 500 MG PO TABS
500.0000 mg | ORAL_TABLET | ORAL | Status: DC
  Administered 2022-08-21: 500 mg via ORAL
  Filled 2022-08-20: qty 1

## 2022-08-20 MED ORDER — APIXABAN 5 MG PO TABS: 5.0000 mg | ORAL_TABLET | Freq: Two times a day (BID) | ORAL | Status: DC

## 2022-08-20 MED ORDER — AMLODIPINE BESYLATE 5 MG PO TABS: 5.0000 mg | ORAL_TABLET | Freq: Every day | ORAL | Status: DC

## 2022-08-20 MED ORDER — VITAMIN D 25 MCG (1000 UNIT) PO TABS
5000.0000 [IU] | ORAL_TABLET | Freq: Every day | ORAL | Status: DC
  Administered 2022-08-21: 5000 [IU] via ORAL
  Filled 2022-08-20 (×2): qty 5

## 2022-08-20 MED ORDER — AZITHROMYCIN 500 MG PO TABS: 500.0000 mg | ORAL_TABLET | ORAL | 0 refills | Status: DC

## 2022-08-20 MED ORDER — ACETAMINOPHEN 325 MG PO TABS: 650.0000 mg | ORAL_TABLET | ORAL | Status: DC | PRN

## 2022-08-20 NOTE — Progress Notes (Signed)
   08/20/22 1615  Medical Necessity for Transport Certificate --- IF THIS TRANSPORT IS ROUND TRIP OR SCHEDULED AND REPEATED, A PHYSICIAN MUST COMPLETE THIS FORM  Transport from: Teacher, English as a foreign language) [facility]  Transport to (Location) [facility]  Did the patient arrive from a [place], [place] or Group Home? No  Is this the closest appropriate facility? Yes  Date of Transport Service 08/20/22  Name of [place]  Reason for Transport Discharge  Is this a hospice patient? No  Describe the Medical Condition Acute CVA  Q1 Are ALL the following "true"? 1. Patient unable to get up from bed without assistance  AND  2. Unable to ambulate  AND  3. Unable to sit in a chair, including wheelchair. No  Q2 Could the patient be transported safely by other means of transportation (I.E., wheelchair van)? No  Q3 Please check any of the following conditions that apply at the time of transport: Risk of injury to self and/or others  Electronic Signature Colen Darling  Credentials SW  Date Signed 08/20/22

## 2022-08-20 NOTE — TOC Transition Note (Signed)
Transition of Care [facility]) - CM/SW Discharge Note   Patient Details  Name: Elizabeth Friedman MRN: [id number] Date of Birth: 11-Mar-1941  [facility] CM/SW Contact:  Colen Darling, [place] Phone Number: 08/20/2022, 2:18 PM   Clinical Narrative:     Attending [place]. SW informed RN to call report. SW calling Del[facility] Pretty to inform of transport. [facility] to provide [facility] to [facility].  Final next level of care: IP Rehab Facility Barriers to Discharge: Barriers Resolved   Patient Goals and CMS Choice Patient states their goals for this hospitalization and ongoing recovery are:: attend rehab CMS Medicare.gov Compare Post Acute Care list provided to:: Patient Represenative (must comment) Choice offered to / list presented to : Adult Children  Discharge Placement                Patient to be transferred to facility by: [facility]      Discharge Plan and Services   Discharge Planning Services: CM Consult                                 Social Determinants of Health (SDOH) Interventions     Readmission Risk Interventions    08/18/2022    4:24 PM  Readmission Risk Prevention Plan  Transportation Screening Complete  PCP or Specialist Appt within 3-5 Days Complete  HRI or [place] Complete  Social Work Consult for [place] Planning/Counseling Complete  Palliative Care Screening Complete  Medication Review [employer]) Complete

## 2022-08-20 NOTE — Progress Notes (Signed)
Report called to [facility] at [facility], verbalized understanding and questions answered

## 2022-08-20 NOTE — Progress Notes (Signed)
Inpatient Rehabilitation Admission Medication Review by a Pharmacist  A complete drug regimen review was completed for this patient to identify any potential clinically significant medication issues.  High Risk Drug Classes Is patient taking? Indication by Medication  Antipsychotic No   Anticoagulant Yes Eliquis - stroke ppx  Antibiotic Yes Azithromycin - bronchiectasis (x 3 mo per 9/23 PCCM note)  Opioid No   Antiplatelet Yes ASA - CAD   Hypoglycemics/insulin No   Vasoactive Medication Yes Amlodipine - HTN  Chemotherapy No   Other Yes Atorvastatin - HLD Topamax - migraine ppx Trazodone - sleep Gabapentin - pain Protonix - GERD Ca, Vit D, Vit B12, Vit E, iron, Vit C - suppl     Type of Medication Issue Identified Description of Issue Recommendation(s)  Drug Interaction(s) (clinically significant)     Duplicate Therapy     Allergy     No Medication Administration End Date     Incorrect Dose     Additional Drug Therapy Needed     Significant med changes from prior encounter (inform family/care partners about these prior to discharge).    Other       Clinically significant medication issues were identified that warrant physician communication and completion of prescribed/recommended actions by midnight of the next day:  No  Name of provider notified for urgent issues identified:   Provider Method of Notification:     Pharmacist comments:   Time spent performing this drug regimen review (minutes):  [place], PharmD, BCPS 08/20/2022 5:34 PM

## 2022-08-20 NOTE — Care Management Important Message (Signed)
Important Message  Patient Details  Name: Elizabeth Friedman MRN: [id number] Date of Birth: Sep 12, 1941   Medicare Important Message Given:  Yes     Dannette Barbara 08/20/2022, 2:51 PM

## 2022-08-20 NOTE — Consult Note (Signed)
   [facility] Inpatient Consult   08/20/2022  Elizabeth Friedman [birth year] [id number]  [place] Organization [ACO] Patient: [employer]   Primary Care Provider: Adin Hector, MD with [facility] Liaison for remote review at [facility]  Patient's electronic medical record  was reviewed for transitional notice  and needs as patient is transitioning to [facility] inpatient rehab.  Plan:  No community follow up until post rehab assessment for needs.   Natividad Brood, RN BSN [place]  [phone] business mobile phone Toll free office [phone]  *[place]  [phone] Fax number: [phone] [email or web address]

## 2022-08-20 NOTE — Progress Notes (Signed)
Patient declines both Flu and pnemoccal vaccine.

## 2022-08-20 NOTE — Progress Notes (Signed)
   08/20/22 1334  Medical Necessity for Transport Certificate --- IF THIS TRANSPORT IS ROUND TRIP OR SCHEDULED AND REPEATED, A PHYSICIAN MUST COMPLETE THIS FORM  Transport from: Teacher, English as a foreign language) [facility]  Transport to (Location) [facility]  Did the patient arrive from a [place], [place] or Group Home? No  Is this the closest appropriate facility? Yes  Date of Transport Service 08/20/22  Name of [place]  Reason for Transport Discharge  Is this a hospice patient? No  Describe the Medical Condition Acute CVA  Q1 Are ALL the following "true"? 1. Patient unable to get up from bed without assistance  AND  2. Unable to ambulate  AND  3. Unable to sit in a chair, including wheelchair. Yes  Q2 Could the patient be transported safely by other means of transportation (I.E., wheelchair van)? No  Q3 Please check any of the following conditions that apply at the time of transport: Risk of injury to self and/or others  Electronic Signature Colen Darling  Credentials SW  Date Signed 08/20/22

## 2022-08-20 NOTE — Progress Notes (Signed)
PMR Admission Coordinator Pre-Admission Assessment   Patient: Elizabeth Friedman is an 81 y.o., female MRN: [id number] DOB: 1941-09-21 Height: _0  (157.5 cm) Weight: 46.3 kg   Insurance Information HMO:     PPO: yes     PCP:      IPA:      80/20:      OTHER:  PRIMARY: [employer]      Policy#: [id number]  Group# [id number]    Subscriber: Patient CM Name:  Marlowe Kays     Phone#: [phone]     Fax#: [phone] Pre-Cert#: [id number] approved for initial 7 days      Employer: Retired Benefits:  Phone #: [phone]     Name: Berton Mount Date: 10/26/2021 - 10/25/2022 Deductible: no deductible ($0) OOP Max: $3,200 ($3,200 met) CIR: $295/day co-pay for days 1-6, $0/day days 7-90 SNF:  $0/day co-pay for days 1-20, $184/day co-pay for days 21-100; limited to 100 days/benefit period Outpatient: $15/visit co-pay; limited by medical necessity Home Health:  100% coverage DME: 80% coverage; 20% co-insurance Providers: in network  SECONDARY:       Policy#:      Phone#:  Development worker, community:       Phone#:    The Engineer, petroleum" for patients in Inpatient Rehabilitation Facilities with attached "Privacy Act [place] Records" was provided and verbally reviewed with: Patient and Family   Emergency Contact Information Contact Information       Name Relation Home Work Mobile    [place] A Son [phone]   [phone]    Nail,Kathy Daughter [phone]   [phone]           Current Medical History  Patient Admitting Diagnosis: CVA History of Present Illness: 81 year old female admitted to [facility] 08/13/22 with dizziness and right-sided paresthesias. History of recent cryptogenic stroke discharged on dual antiplatelet therapy. Repeat MRI brain showed multifocal acute ischemic infarcts in the anterior and posterior circulation consistent with central embolic stroke. Of note patient finished chemotherapy for stage I carcinosarcoma  approximately 3 months ago.  She also finished adjuvant radiation approximately month ago. 10/22: Patient develops fine motor deficit, left hand decreased grip, left side drift , patient notice deficit at 1;15 am. Plan to continue with aspirin and plavix for 3 weeks then aspirin alone. Plan to repeat MRI due to gait abnormalities and Left LE weakness. D-dimer which is significantly elevated at 10.42. CTA Chest negative for PE, Lower extremity venous Dopplers did not reveal any evidence of DVT. 2D echocardiogram showed LVEF 60 to 65%, normal mitral valve, normal aortic valve, normal left atrial size and no atrial level shunt with bubble study. Recurrent acute ischemic stroke in multiple vascular territories with increasing concern that etiology is hypercoagulability of malignancy given elevated D-Dimer and uptrending CA-125 and ongoing ischemic events. History of breast cancer and aggressive form of uterine cancer with uptrending markers. Therapies are recommending intensive rehab program.  Complete NIHSS TOTAL: 2   Patient's medical record from [facility] has been reviewed by the rehabilitation admission coordinator and physician.   Past Medical History      Past Medical History:  Diagnosis Date   Allergic rhinitis     Anemia     Anginal pain (HCC)     Arthritis     Atypical chest pain     Breast cancer ([place])     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[place])     Ductal carcinoma in situ (DCIS) of right breast     Fibrocystic breast  disease     GERD (gastroesophageal reflux disease)     Hearing aid worn      bilateral   History of hiatal hernia     Hyperlipidemia     Lung nodule     Menopause     Migraine     Migraine      optical migraines   Motion sickness     Plantar fasciitis     Prolapse of female pelvic organs      hodge pessary   Raynaud's disease     Sciatic leg pain     Sciatica     Skin cancer of face     Stroke [facility])     Urinary retention with incomplete bladder emptying     Uterine carcinosarcoma  ([place]) 11/2021   Vitreous detachment of both eyes        Has the patient had major surgery during 100 days prior to admission? No   Family History   family history includes Breast cancer in her mother; Colon cancer in her brother; Heart disease in her father.   Current Medications   Current Facility-Administered Medications:    acetaminophen (TYLENOL) tablet 650 mg, 650 mg, Oral, Q4H PRN, 650 mg at 08/19/22 2047 **OR** acetaminophen (TYLENOL) 160 MG/5ML solution 650 mg, 650 mg, Per Tube, Q4H PRN **OR** acetaminophen (TYLENOL) suppository 650 mg, 650 mg, Rectal, Q4H PRN, Mansy, Jan A, MD   amLODipine (NORVASC) tablet 5 mg, 5 mg, Oral, Daily, Regalado, Belkys A, MD, 5 mg at 08/20/22 0926   apixaban (ELIQUIS) tablet 5 mg, 5 mg, Oral, BID, Bhagat, Srishti L, MD, 5 mg at 08/20/22 4008   ascorbic acid (VITAMIN C) tablet 250 mg, 250 mg, Oral, Daily, Mansy, Jan A, MD, 250 mg at 08/20/22 6761   aspirin chewable tablet 81 mg, 81 mg, Oral, Daily, Mansy, Jan A, MD, 81 mg at 08/20/22 9509   atorvastatin (LIPITOR) tablet 40 mg, 40 mg, Oral, Daily, Mansy, Jan A, MD, 40 mg at 08/20/22 3267   azithromycin [facility]) tablet 500 mg, 500 mg, Oral, Once per day on Mon Wed Fri, Regalado, Belkys A, MD, 500 mg at 08/19/22 1245   cholecalciferol (VITAMIN D3) tablet 5,000 Units, 5,000 Units, Oral, Q breakfast, Mansy, Jan A, MD, 5,000 Units at 08/20/22 8099   cyanocobalamin (VITAMIN B12) tablet 500 mcg, 500 mcg, Oral, Daily, Mansy, Jan A, MD, 500 mcg at 08/20/22 8338   cycloSPORINE (RESTASIS) 0.05 % ophthalmic emulsion 1 drop, 1 drop, Both Eyes, BID, Mansy, Jan A, MD, 1 drop at 08/20/22 2505   Ferrous Fumarate (HEMOCYTE - 106 mg FE) tablet 106 mg of iron, 1 tablet, Oral, Daily, Mansy, Jan A, MD, 106 mg of iron at 08/20/22 0926   gabapentin (NEURONTIN) capsule 400 mg, 400 mg, Oral, BID, Mansy, Jan A, MD, 400 mg at 08/20/22 3976   hydrALAZINE (APRESOLINE) injection 10 mg, 10 mg, Intravenous, Q4H PRN, Regalado, Belkys A,  MD   labetalol (NORMODYNE) injection 5 mg, 5 mg, Intravenous, Q2H PRN, Regalado, Belkys A, MD   ondansetron (ZOFRAN) injection 4 mg, 4 mg, Intravenous, Q4H PRN, Mansy, Jan A, MD   Oral care mouth rinse, 15 mL, Mouth Rinse, PRN, Regalado, Belkys A, MD   pantoprazole (PROTONIX) EC tablet 40 mg, 40 mg, Oral, Q breakfast, Mansy, Jan A, MD, 40 mg at 08/20/22 7341   polyvinyl alcohol (LIQUIFILM TEARS) 1.4 % ophthalmic solution 1 drop, 1 drop, Both Eyes, QHS, Mansy, Jan A, MD, 1 drop at 08/19/22 2049  senna-docusate (Senokot-S) tablet 1 tablet, 1 tablet, Oral, QHS PRN, Mansy, Jan A, MD   topiramate (TOPAMAX) tablet 50 mg, 50 mg, Oral, QPM, Mansy, Jan A, MD, 50 mg at 08/19/22 1806   traZODone (DESYREL) tablet 25 mg, 25 mg, Oral, QHS PRN, Mansy, Arvella Merles, MD   triamcinolone (NASACORT) nasal inhaler 2 spray, 2 spray, Each Nare, Daily PRN, Mansy, Arvella Merles, MD   Vitamin E CAPS 400 Units, 400 Units, Oral, Q breakfast, Mansy, Jan A, MD, 400 Units at 08/20/22 8832   Patients Current Diet:  Diet Order                  Diet - low sodium heart healthy             Diet Heart Room service appropriate? Yes; Fluid consistency: Thin  Diet effective now                         Precautions / Restrictions Precautions Precautions: Fall Precaution Comments: Also, pt needs cues to attend to LUE during transfers/mobility. Restrictions Weight Bearing Restrictions: No    Has the patient had 2 or more falls or a fall with injury in the past year? No   Prior Activity Level   Prior Functional Level Self Care: Did the patient need help bathing, dressing, using the toilet or eating? Needed some help   Indoor Mobility: Did the patient need assistance with walking from room to room (with or without device)? Independent   Stairs: Did the patient need assistance with internal or external stairs (with or without device)? Needed some help   Functional Cognition: Did the patient need help planning regular tasks such as  shopping or remembering to take medications? Independent   Patient Information Are you of Hispanic, Latino/a,or Spanish origin?: A. No, not of Hispanic, Latino/a, or Spanish origin What is your race?: A. White Do you need or want an interpreter to communicate with a doctor or health care staff?: 0. No   Patient's Response To:  Health Literacy and Transportation Is the patient able to respond to health literacy and transportation needs?: Yes Health Literacy - How often do you need to have someone help you when you read instructions, pamphlets, or other written material from your doctor or pharmacy?: Never In the past 12 months, has lack of transportation kept you from medical appointments or from getting medications?: No In the past 12 months, has lack of transportation kept you from meetings, work, or from getting things needed for daily living?: No   Development worker, international aid / [place] Devices/Equipment: Cabin crew Home Equipment: Shower seat - built in, Hand held shower head, Rollator (4 wheels)   Prior Device Use: Indicate devices/aids used by the patient prior to current illness, exacerbation or injury? Walker   Current Functional Level Cognition   Overall Cognitive Status: Within Functional Limits for tasks assessed Orientation Level: Oriented X4 General Comments: ? HOH vs cognition deficits. MIN cues for LUE attention    Extremity Assessment (includes Sensation/Coordination)   Upper Extremity Assessment: LUE deficits/detail LUE Deficits / Details: Continued LUE proprioception deficits in L hand/fingers; appears intact at wrist and elbow. Sensation appears inact. Significant LUE coordination deficits in [facility] and [place]. Delayed response in LUE. Moderate L neglect of LUE. LUE:  (No shoulder issues noted today) LUE Sensation: decreased proprioception LUE Coordination: decreased fine motor, decreased gross motor  Lower Extremity Assessment: Defer to PT evaluation  ADLs   Overall ADL's : Needs assistance/impaired Grooming: Oral care, Cueing for compensatory techniques, Minimal assistance, Standing (CGA-MOD A standing balance at sink today x10 minutes) Grooming Details (indicate cue type and reason): Pt requiring cues for adaptive techniques at sink; pt statically holding top of toothpaste with L fingers and twisting entire tube with R hand. Pt attempting to squeeze tube with L hand grip to place toothpaste onto brush (which she was holding in R hand); L grip not strong enough to expel toothpaste; OT suggested placing tube on the countertop with opening over sink and pushing down with L hand, which pt did successfully. Pt required OT verbal cues for moving toothpaste tube with L hand and then releasing with L hand and placing L hand on countertop for balance assist. While standing and brushing, pt leaning forward and L, requiring CGA and eventually MOD A as she fatigued. Able to stand for total of 10 minutes. Lower Body Dressing: Moderate assistance Toilet Transfer: Minimal assistance Toilet Transfer Details (indicate cue type and reason): MIN A with RW for balance; OT cues for LUE attention and movement of BIL hands for sit to stand and stand to sit. Toileting- Clothing Manipulation and Hygiene: Set up, Moderate assistance (MOD A clothing management from standing at RW; set up for hygiene from sitting using RUE. Pt is right-handed.) Functional mobility during ADLs: Min guard, Rolling walker (2 wheels) (approx 15' 2 attempts in room) General ADL Comments: SUPERVISION toileting with lateral leans for pericare. CGA + MIN cues hand washing, cues for thouroughly washing L hand. MIN A + L HHA for functional mobility ~80 ft.     Mobility   Overal bed mobility: Needs Assistance Bed Mobility: Sit to Supine Supine to sit: Min guard Sit to supine: Min guard General bed mobility comments: received and left sitting     Transfers   Overall transfer level: Needs  assistance Equipment used: Rolling walker (2 wheels) Transfers: Sit to/from Stand Sit to Stand: Min guard General transfer comment: responds well to cues for pushing from seated surface. Needs min assist for L hand to place on RW, however is then able to grip handle. Once standing, tends to weight shift to R and needs frequent cues for attention to L hemibody.     Ambulation / Gait / Stairs / Wheelchair Mobility   Ambulation/Gait Ambulation/Gait assistance: Min guard, Herbalist (Feet): 60 Feet Assistive device: Rolling walker (2 wheels) Gait Pattern/deviations: Step-to pattern, Decreased step length - right, Decreased step length - left General Gait Details: follows commands well with inconsistencies noted including 1 instance of scissoring gait. Still demonstrates narrow BOS. Very slow gait speed and cues to keep RW close to body. RW used with tactile cues for L knee extension during stance time due to L LE buckling. Gait velocity: slightly decreased General stair comments: will attempt at later session if possible, but has railing on the R at home if necessary.     Posture / Balance Balance Overall balance assessment: Needs assistance Sitting-balance support: No upper extremity supported, Feet supported Sitting balance-Leahy Scale: Good Standing balance support: During functional activity, No upper extremity supported Standing balance-Leahy Scale: Fair     Special needs/care consideration Skin intact    Previous Home Environment (from acute therapy documentation) Living Arrangements: Alone Available Help at Discharge: Family, Available 24 hours/day Type of Home: House Home Layout: One level Home Access: Stairs to enter Entrance Stairs-Rails: Right Entrance Stairs-Number of Steps: 2-3 steps on the side with  pull bar Bathroom Shower/Tub: Tub/shower unit, Multimedia programmer: Standard Bathroom Accessibility: Yes How Accessible: Accessible via walker [place]: No   Discharge Living Setting Plans for Discharge Living Setting: House Does the patient have any problems obtaining your medications?: No   Social/Family/Support Systems Anticipated Caregiver: son, [name] Anticipated Caregiver's Contact Information: [phone] Discharge Plan Discussed with Primary Caregiver: Yes Is Caregiver In Agreement with Plan?: Yes Does Caregiver/Family have Issues with Lodging/Transportation while Pt is in Rehab?: No   Goals Patient/Family Goal for Rehab: Mod I/min A, PT, OT, independent with SLP Expected length of stay: 12-14 days Pt/Family Agrees to Admission and willing to participate: Yes Program Orientation Provided & Reviewed with Pt/Caregiver Including Roles  & Responsibilities: Yes  Barriers to Discharge: Insurance for SNF coverage   Decrease burden of Care through IP rehab admission: Othern/a   Possible need for SNF placement upon discharge: not anticipated   Patient Condition: I have reviewed medical records from [facility], spoken with  TOC , and patient and spouse. I discussed via phone for inpatient rehabilitation assessment.  Patient will benefit from ongoing PT, OT, and SLP, can actively participate in 3 hours of therapy a day 5 days of the week, and can make measurable gains during the admission.  Patient will also benefit from the coordinated team approach during an Inpatient Acute Rehabilitation admission.  The patient will receive intensive therapy as well as Rehabilitation physician, nursing, social worker, and care management interventions.  Due to safety, skin/wound care, disease management, medication administration, pain management, and patient education the patient requires 24 hour a day rehabilitation nursing.  The patient is currently min to minguard with mobility and min assist with basic ADLs.  Discharge setting and therapy post discharge at home with home health is anticipated.  Patient has agreed to participate in the Acute  Inpatient Rehabilitation Program and will admit Today, 08/20/22.   Preadmission Screen Completed By:  Retta Diones, 08/20/2022 12:02 PM ______________________________________________________________________   Discussed status with Dr. Tressa Busman on 08/20/22 at 1000 and received approval for admission today.   Admission Coordinator:  Retta Diones, RN, time 1202/Date 08/20/22    Assessment/Plan: Diagnosis: Does the need for close, 24 hr/day Medical supervision in concert with the patient's rehab needs make it unreasonable for this patient to be served in a less intensive setting? Yes Co-Morbidities requiring supervision/potential complications: HTN, recurrent CVA, malignancy (breast, uterine), bronchiectasis, malnutrition Due to safety, skin/wound care, disease management, medication administration, pain management, and patient education, does the patient require 24 hr/day rehab nursing? Yes Does the patient require coordinated care of a physician, rehab nurse, PT, OT, and SLP to address physical and functional deficits in the context of the above medical diagnosis(es)? Yes Addressing deficits in the following areas: balance, endurance, locomotion, strength, transferring, bathing, dressing, feeding, grooming, toileting, cognition, speech, and psychosocial support Can the patient actively participate in an intensive therapy program of at least 3 hrs of therapy 5 days a week? Yes The potential for patient to make measurable gains while on inpatient rehab is good Anticipated functional outcomes upon discharge from inpatient rehab: min assist PT, min assist OT, independent SLP Estimated rehab length of stay to reach the above functional goals is: 10-14 days Anticipated discharge destination: Home 10. Overall Rehab/Functional Prognosis: good     MD Signature:   Gertie Gowda, DO 08/20/2022          Revision History  Note  Details  Author Gertie Gowda, DO File Time 08/20/2022 12:16 PM  Author Type Physician Status Signed  Last Editor Gertie Gowda, DO Service Physical Medicine and Rehabilitation

## 2022-08-20 NOTE — Discharge Summary (Signed)
Physician Discharge Summary   Patient: Elizabeth Friedman MRN: [id number] DOB: 08-May-1941  Admit date:     08/13/2022  Discharge date: 08/20/22  Discharge Physician: Val Riles   PCP: Adin Hector, MD   Recommendations at discharge:   Monitor blood pressure and titrate medications accordingly.  Watch for any signs of bleeding or bruises.  Patient has been started on Eliquis. Follow with neurology in 1 to 2 weeks Follow-up with oncologist for prior history of cancer. Hypercoagulable work-up negative so far, follow pending factor V Leyden and prothrombin gene mutation.  Still in process  Discharge Diagnoses: Principal Problem:   Acute CVA (cerebrovascular accident) [facility]) Active Problems:   Dyslipidemia   GERD without esophagitis   Migraine   CVA (cerebral vascular accident) ([place])  Resolved Problems:   * No resolved hospital problems. [facility] Course: Assessment and Plan: 81 year old with past medical history significant for recurrent CVA, dyslipidemia, GERD, osteoarthritis, vitamin B12 deficiency, breast and uterine cancer who was recently admitted for stroke on 10/13 who presents now with dizziness, right thigh paresthesia and tingling, she wake up the morning of admission with the symptoms.  She has been taking aspirin and Plavix.  MRI confirmed early subacute infarct involving the posterior right frontal parietal region, left cerebellum and left temporal occipital region.  Minimal petechial blood products at the level of the cerebellum without hemorrhagic transformation or significant regional mass effect. Patient admitted with recurrent stroke, while in the hospital she develop further neurological deficits and recurrent stroke.  Due to increased concern for hypercoagulability in the setting of malignancy, an elevated D-dimer and uptrending CEA 125 neurology and oncology agreed that benefits outweighed risk and patient was a started on anticoagulation.  Patient was started on  low-dose heparin, and transitioned to Eliquis.  Neurology was consulted.  Further management as below. # Acute CVA: Recurrent's Stroke. Concern for hypercoagulability in setting of malignancy  Due to Recurrent strokes patient was on aspirin and Plavix. Patient presented with dizziness and right-sided paresthesia.  MRI: Scattered acute to early subacute infarct involving the posterior right frontoparietal region, left cerebellum and left temporal occipital region. Underwent TEE negative. On 10/22: Patient develops fine motor deficit, left hand decreased grip, left side drift , patient notice deficit at 1;15 am. On 10-23: notice to have new worsening left LE weakness.  Repeated MRI 10/23: Interval extension and infarction in the right hemisphere, now affecting the deep insula and more of the frontoparietal cortical and subcortical brain.  Consistent with progressive right MCA branch vessel infarct.  Newly seen subcentimeter focus of acute infarct in the left frontal white matter. Due to concern for hypercoagulability, in the setting of malignancy, elevated D-dimer, recurrent stroke, no evidence of A-fib, patient was started on low rate heparin drip, and then transitioned to Eliquis. Plavix discontinued, continue aspirin per neurology. Started Norvasc to controlled BP.  Hypercoagulable work-up negative so far, pending factor V Leyden and prothrombin gene mutation. # Dyslipidemia: Continue with Lipitor # GERD: Continue with PPI # Migraine: On Topamax # Bronchiectasis. Suppose to be on Azithromycin for chronic bronchiectasis for 3 month TIW per Dr Lanney Gins note 06/2022. I have resume Azithromycin.  Follow-up with pulmonologist in 1 to 2 weeks # History of uterine and breast cancer: Oncology is consulted to provide input in regards hypercoagulability in the setting of recurrent stroke. Outpatient follow-up for further management. # Protein calorie malnutrition-severe: Estimated body mass index is 18.66  kg/m as calculated from the following:  Height as  of this encounter: '5\' 2"'$  (1.575 m).   Weight as of this encounter: 46.3 kg. Dietitian consult     Consultants: Neurology Procedures performed: None  Disposition: Rehabilitation facility Diet recommendation:  Discharge Diet Orders (From admission, onward)     Start     Ordered   08/20/22 0000  Diet - low sodium heart healthy        08/20/22 1152           Cardiac diet DISCHARGE MEDICATION: Allergies as of 08/20/2022   No Known Allergies      Medication List     STOP taking these medications    celecoxib 200 MG capsule Commonly known as: CELEBREX   clopidogrel 75 MG tablet Commonly known as: PLAVIX       TAKE these medications    acetaminophen 500 MG tablet Commonly known as: TYLENOL Take 1,000 mg by mouth every 6 (six) hours as needed for moderate pain or mild pain.   alum & mag hydroxide-simeth 200-200-20 MG/5ML suspension Commonly known as: MAALOX/MYLANTA Take 30 mLs by mouth every 6 (six) hours as needed for indigestion or heartburn.   amLODipine 5 MG tablet Commonly known as: NORVASC Take 1 tablet (5 mg total) by mouth daily. Start taking on: August 21, 2022   apixaban 5 MG Tabs tablet Commonly known as: ELIQUIS Take 1 tablet (5 mg total) by mouth 2 (two) times daily.   ascorbic acid 250 MG tablet Commonly known as: VITAMIN C Take 1 tablet (250 mg total) by mouth daily.   aspirin 81 MG chewable tablet Chew 1 tablet (81 mg total) by mouth daily.   atorvastatin 40 MG tablet Commonly known as: LIPITOR Take 1 tablet (40 mg total) by mouth daily.   azithromycin 500 MG tablet Commonly known as: ZITHROMAX Take 1 tablet (500 mg total) by mouth 3 (three) times a week. Start taking on: August 21, 2022   CALTRATE 600 PLUS-VIT D PO Take 1 tablet by mouth in the morning.   Carboxymethylcellulose Sod PF 1 % Gel Place 1 drop into both eyes at bedtime.   Coenzyme Q10 200 MG Tabs Take 200 mg by  mouth in the morning.   Ferrous Fumarate 324 (106 Fe) MG Tabs tablet Commonly known as: HEMOCYTE - 106 mg FE Take 1 tablet (106 mg of iron total) by mouth daily.   gabapentin 400 MG capsule Commonly known as: NEURONTIN Take 1 capsule (400 mg total) by mouth 2 (two) times daily.   magnesium hydroxide 400 MG/5ML suspension Commonly known as: MILK OF MAGNESIA Take by mouth daily as needed for mild constipation.   NASACORT ALLERGY 24HR NA Place 1 spray into the nose daily as needed (sinus congestion).   pantoprazole 40 MG tablet Commonly known as: PROTONIX Take 40 mg by mouth in the morning.   Restasis 0.05 % ophthalmic emulsion Generic drug: cycloSPORINE Place 1 drop into both eyes 2 (two) times daily.   topiramate 50 MG tablet Commonly known as: TOPAMAX Take 50 mg by mouth every evening.   VITAMIN B-12 PO Take 1 tablet by mouth in the morning.   Vitamin D 125 MCG (5000 UT) Caps Take 5,000 Units by mouth in the morning.   vitamin E 180 MG (400 UNITS) capsule Take 400 Units by mouth in the morning.        Discharge Exam: Filed Weights   08/14/22 1449 08/17/22 0802  Weight: 50.4 kg 46.3 kg   Condition at discharge: good  The results of significant  diagnostics from this hospitalization (including imaging, microbiology, ancillary and laboratory) are listed below for reference.   Imaging Studies: CT Angio Chest Pulmonary Embolism (PE) W or WO Contrast  Result Date: 08/17/2022 CLINICAL DATA:  Pulmonary embolism (PE) suspected, high prob EXAM: CT ANGIOGRAPHY CHEST WITH CONTRAST TECHNIQUE: Multidetector CT imaging of the chest was performed using the standard protocol during bolus administration of intravenous contrast. Multiplanar CT image reconstructions and MIPs were obtained to evaluate the vascular anatomy. RADIATION DOSE REDUCTION: This exam was performed according to the departmental dose-optimization program which includes automated exposure control, adjustment of  the mA and/or kV according to patient size and/or use of iterative reconstruction technique. CONTRAST:  44m OMNIPAQUE IOHEXOL 350 MG/ML SOLN COMPARISON:  Chest XR, 08/08/2022. CT chest, 08/07/2022. NM V/Q scan, 08/08/2022 FINDINGS: Cardiovascular: Satisfactory opacification of the pulmonary arteries to the segmental level. No evidence of pulmonary embolism. Normal heart size. No pericardial effusion. Mediastinum/Nodes: No enlarged mediastinal, hilar, or axillary lymph nodes. Thyroid gland, trachea, and esophagus demonstrate no significant findings. Lungs/Pleura: No focal consolidation or mass. Similar distribution and appearance of tree-in-bud nodularities, greatest within the RIGHT lung base. LEFT basilar calcified granuloma. No pleural effusion or pneumothorax. Upper Abdomen: No acute abnormality. Punctate calcified granulomas within liver and spleen, likely sequelae of prior granulomatous disease. Musculoskeletal: No chest wall abnormality. No acute or significant osseous findings. Review of the MIP images confirms the above findings. IMPRESSION: 1.  No segmental or larger pulmonary embolus. 2. Similar distribution and appearance of tree-in-bud nodularities, greatest within the RIGHT lung base. Findings are likely to reflect an infectious or inflammatory process. 3. Punctate calcified granulomas within chest and abdomen, likely sequelae of prior granulomatous disease. Electronically Signed   By: JMichaelle BirksM.D.   On: 08/17/2022 16:42   MR CERVICAL SPINE W WO CONTRAST  Result Date: 08/17/2022 CLINICAL DATA:  Ataxia.  Left-sided weakness.  Stroke. EXAM: MRI CERVICAL SPINE WITHOUT AND WITH CONTRAST TECHNIQUE: Multiplanar and multiecho pulse sequences of the cervical spine, to include the craniocervical junction and cervicothoracic junction, were obtained without and with intravenous contrast. CONTRAST:  527mGADAVIST GADOBUTROL 1 MMOL/ML IV SOLN COMPARISON:  None Available. FINDINGS: Alignment: Normal  Vertebrae: No fracture or focal lesion. Cord: No cord insult.  No cord compression. Posterior Fossa, vertebral arteries, paraspinal tissues: See results of brain MRI. Disc levels: The foramen magnum is widely patent. There is ordinary mild osteoarthritis of the C1-2 articulation but no encroachment upon the neural structures. C2-3: Facet osteoarthritis left more than right. No compressive stenosis. C3-4: Bulging of the disc. Facet osteoarthritis left more than right. Moderate left foraminal narrowing. C4-5: Spondylosis with endplate osteophytes and bulging of the disc. No cord compression. Moderate bilateral foraminal narrowing. C5-6: Spondylosis with endplate osteophytes and bulging of the disc. No cord compression. Moderate bilateral foraminal narrowing. C6-7: Small right paracentral disc protrusion. No neural compression. C7-T1: Mild facet osteoarthritis.  No stenosis. IMPRESSION: 1. No cord insult. 2. Degenerative spondylosis from C3-4 through C5-6. No cord compression. Foraminal narrowing that could possibly be symptomatic on the left at C3-4 and bilateral at C4-5 and C5-6. Electronically Signed   By: MaNelson Chimes.D.   On: 08/17/2022 14:13   MR BRAIN W WO CONTRAST  Result Date: 08/17/2022 CLINICAL DATA:  Follow-up stroke.  Right-sided weakness. EXAM: MRI HEAD WITHOUT AND WITH CONTRAST TECHNIQUE: Multiplanar, multiecho pulse sequences of the brain and surrounding structures were obtained without and with intravenous contrast. CONTRAST:  45m29mADAVIST GADOBUTROL 1 MMOL/ML IV SOLN COMPARISON:  Head CT yesterday.  MRI 08/13/2022. FINDINGS: Brain: Small areas of acute infarction previously seen in the left cerebellum are normalizing on diffusion imaging. No new cerebellar or brainstem insult. There has been extension of the region of infarction in the right hemisphere, now seen affecting the deep insula and more of the frontoparietal cortical and subcortical brain. This is consistent with progressive right MCA  branch vessel infarction. Newly seen subcentimeter focus of acute infarction in the left frontal white matter. Normalization of diffusion at the location of punctate acute infarctions previously seen in the left posterior temporal lobe. No evidence of hemorrhage or mass effect. Elsewhere, chronic ischemic changes of the pons, thalami, basal ganglia and hemispheric white matter are stable. No hydrocephalus. No extra-axial collection. After contrast administration, some foci of enhancement are notable in the regions of infarction in the left cerebellum and right frontoparietal region. Vascular: Major vessels at the base of the brain show flow. Skull and upper cervical spine: Negative Sinuses/Orbits: Clear/normal Other: None IMPRESSION: 1. Interval extension of infarction in the right hemisphere, now seen affecting the deep insula and more of the frontoparietal cortical and subcortical brain. This is consistent with progressive right MCA branch vessel infarction. No evidence of hemorrhage or mass effect. 2. Newly seen subcentimeter focus of acute infarction in the left frontal white matter. 3. Some normalization of diffusion at the location of acute infarctions previously seen in the left posterior temporal lobe and left cerebellum. 4. Chronic small-vessel ischemic changes elsewhere throughout the brain as outlined above. Electronically Signed   By: Nelson Chimes M.D.   On: 08/17/2022 14:10   ECHO TEE  Result Date: 08/17/2022    TRANSESOPHOGEAL ECHO REPORT   Patient Name:   BIANCO CANGE Date of Exam: 08/17/2022 Medical Rec #:  [id number]    Height:       62.0 in Accession #:    [id number]   Weight:       102.0 lb Date of Birth:  02/02/1941   BSA:          1.436 m Patient Age:    28 years     BP:           140/75 mmHg Patient Gender: F            HR:           72 bpm. Exam Location:  [facility] Procedure: Transesophageal Echo, Cardiac Doppler, Color Doppler and Saline            Contrast Bubble Study Indications:      Acute CVA  History:         Patient has prior history of Echocardiogram examinations, most                  recent 08/10/2022. Stroke. Atypical chest pain , Breast cancer.  Sonographer:     Sherrie Sport Referring Phys:  [id number] [facility] A [facility] Diagnosing Phys: Neoma Laming PROCEDURE: The transesophogeal probe was passed without difficulty through the esophogus of the patient. Sedation performed by performing physician. The patient developed no complications during the procedure. IMPRESSIONS  1. Left ventricular ejection fraction, by estimation, is 65 to 70%. The left ventricle has normal function.  2. Right ventricular systolic function is normal. The right ventricular size is normal.  3. Left atrial size was mildly dilated. No left atrial/left atrial appendage thrombus was detected.  4. Right atrial size was mildly dilated.  5. The mitral valve is grossly normal. Trivial mitral valve regurgitation.  6. The aortic valve is calcified. Aortic valve regurgitation is trivial.  7. Agitated saline contrast bubble study was negative, with no evidence of any interatrial shunt. Conclusion(s)/Recommendation(s): No LA/LAA thrombus identified. Negative bubble study for interatrial shunt. No intracardiac source of embolism detected on this on this transesophageal echocardiogram. FINDINGS  Left Ventricle: Left ventricular ejection fraction, by estimation, is 65 to 70%. The left ventricle has normal function. The left ventricular internal cavity size was normal in size. Right Ventricle: The right ventricular size is normal. No increase in right ventricular wall thickness. Right ventricular systolic function is normal. Left Atrium: Left atrial size was mildly dilated. No left atrial/left atrial appendage thrombus was detected. Right Atrium: Right atrial size was mildly dilated. Pericardium: There is no evidence of pericardial effusion. Mitral Valve: The mitral valve is grossly normal. Trivial mitral valve regurgitation. Tricuspid  Valve: The tricuspid valve is grossly normal. Tricuspid valve regurgitation is mild. Aortic Valve: The aortic valve is calcified. Aortic valve regurgitation is trivial. Pulmonic Valve: The pulmonic valve was normal in structure. Pulmonic valve regurgitation is trivial. Aorta: The aortic root, ascending aorta and aortic arch are all structurally normal, with no evidence of dilitation or obstruction. IAS/Shunts: No atrial level shunt detected by color flow Doppler. Agitated saline contrast was given intravenously to evaluate for intracardiac shunting. Agitated saline contrast bubble study was negative, with no evidence of any interatrial shunt. Neoma Laming Electronically signed by Neoma Laming Signature Date/Time: 08/17/2022/9:33:10 AM    Final    CT HEAD WO CONTRAST (5MM)  Result Date: 08/16/2022 CLINICAL DATA:  Neuro deficit, acute, stroke suspected. Severe headache earlier, now better. EXAM: CT HEAD WITHOUT CONTRAST TECHNIQUE: Contiguous axial images were obtained from the base of the skull through the vertex without intravenous contrast. RADIATION DOSE REDUCTION: This exam was performed according to the departmental dose-optimization program which includes automated exposure control, adjustment of the mA and/or kV according to patient size and/or use of iterative reconstruction technique. COMPARISON:  Head CT dated 08/13/2022.  Brain MRI dated 08/13/2022. FINDINGS: Brain: Mild generalized age related parenchymal atrophy with commensurate dilatation of the ventricles and sulci. Ventricles are stable in size and configuration in the short-term interval. Ill-defined area of low-density within the posterior RIGHT frontal lobe, corresponding to the brain MRI report of 08/13/2022 which described acute early subacute infarct in this area. Similar findings in the LEFT cerebellum and LEFT temporal occipital lobe. No mass effect or midline shift. No parenchymal hemorrhage. No extra-axial hemorrhage. Old lacunar  infarct again noted in the LEFT caudate. Mild chronic small vessel ischemic changes again noted within the bilateral periventricular white matter regions. Vascular: Chronic calcified atherosclerotic changes of the large vessels at the skull base. No unexpected hyperdense vessel. Skull: Normal. Negative for fracture or focal lesion. Sinuses/Orbits: No acute finding. Other: None. IMPRESSION: 1. Ill-defined areas of low-density within the posterior RIGHT frontal lobe, LEFT cerebellum and LEFT temporal-occipital lobe, corresponding to the findings of acute to early subacute infarcts reported on brain MRI of 08/13/2022. 2. No new findings compared to the recent head CT and brain MRI of 08/13/2022. No mass effect or midline shift on today's exam. No parenchymal or extra-axial hemorrhage. 3. Chronic ischemic changes in the white matter and LEFT caudate. Electronically Signed   By: Franki Cabot M.D.   On: 08/16/2022 09:20   MR BRAIN WO CONTRAST  Result Date: 08/14/2022 CLINICAL DATA:  Follow-up examination for stroke. EXAM: MRI HEAD WITHOUT CONTRAST TECHNIQUE: Multiplanar, multiecho pulse sequences of the brain and  surrounding structures were obtained without intravenous contrast. COMPARISON:  Prior CT from earlier the same day. FINDINGS: Brain: Cerebral volume within normal limits. Scattered patchy T2/FLAIR hyperintensity involving the periventricular deep white matter of both cerebral hemispheres as well as the pons, consistent with chronic microvascular ischemic disease, mild for age. Few small remote lacunar infarcts noted at the left caudate head and ventral right thalamus. Patchy diffusion signal abnormality involving the cortical and subcortical aspect of the right frontoparietal region, with involvement of the pre and postcentral gyri, consistent with an acute to early subacute right MCA distribution infarct. No associated hemorrhage. There is an additional patchy small volume acute to early subacute left  cerebellar infarct. Minimal petechial blood products at this level without hemorrhagic transformation or significant mass effect. Few additional punctate foci of acute to early subacute ischemia noted within the left temporal occipital region (series 5, images 19, 16). Gray-white matter differentiation otherwise maintained. No other acute or chronic intracranial blood products. No mass lesion or midline shift. No hydrocephalus or extra-axial fluid collection. Partially empty sella noted. Vascular: Major intracranial vascular flow voids are maintained. Skull and upper cervical spine: Craniocervical junction normal. Bone marrow signal intensity within normal limits. No scalp soft tissue abnormality. Sinuses/Orbits: Prior bilateral ocular lens replacement. Paranasal sinuses mastoid air cells are clear. Other: None. IMPRESSION: 1. Scattered acute to early subacute infarcts involving the posterior right frontal parietal region, left cerebellum, and left temporoccipital region. Minimal petechial blood products at the left cerebellum without hemorrhagic transformation or significant regional mass effect. Given the various vascular distributions involved, a central thromboembolic etiology is likely. 2. Underlying chronic microvascular ischemic disease with a few small remote lacunar infarcts as above. Electronically Signed   By: Jeannine Boga M.D.   On: 08/14/2022 00:21   CT HEAD WO CONTRAST (5MM)  Result Date: 08/13/2022 CLINICAL DATA:  Dizziness.  Recent stroke. EXAM: CT HEAD WITHOUT CONTRAST TECHNIQUE: Contiguous axial images were obtained from the base of the skull through the vertex without intravenous contrast. RADIATION DOSE REDUCTION: This exam was performed according to the departmental dose-optimization program which includes automated exposure control, adjustment of the mA and/or kV according to patient size and/or use of iterative reconstruction technique. COMPARISON:  08/08/2022 FINDINGS: Brain: No  evidence of intracranial hemorrhage, acute infarction, hydrocephalus, extra-axial collection, or mass lesion/mass effect. Old lacunar infarcts are again seen involving the left basal ganglia and left cerebellum. Vascular:  No hyperdense vessel or other acute findings. Skull: No evidence of fracture or other significant bone abnormality. Sinuses/Orbits:  No acute findings. Other: None. IMPRESSION: No acute intracranial abnormality. Old lacunar infarcts in left basal ganglia and left cerebellum. Electronically Signed   By: Marlaine Hind M.D.   On: 08/13/2022 17:51   ECHOCARDIOGRAM COMPLETE BUBBLE STUDY  Result Date: 08/10/2022    ECHOCARDIOGRAM REPORT   Patient Name:   SEVEN MARENGO Date of Exam: 08/10/2022 Medical Rec #:  [id number]    Height:       62.0 in Accession #:    [id number]   Weight:       101.2 lb Date of Birth:  1940-12-21   BSA:          1.431 m Patient Age:    63 years     BP:           134/77 mmHg Patient Gender: F            HR:           65 bpm.  Exam Location:  [facility] Procedure: 2D Echo, Color Doppler, Cardiac Doppler and Saline Contrast Bubble            Study Indications:     Stroke 434.91 / I63.9  History:         Patient has prior history of Echocardiogram examinations, most                  recent 12/22/2021. Risk Factors:Dyslipidemia. Migraines.  Sonographer:     Sherrie Sport Referring Phys:  Opal Diagnosing Phys: Serafina Royals MD IMPRESSIONS  1. Left ventricular ejection fraction, by estimation, is 60 to 65%. The left ventricle has normal function. The left ventricle has no regional wall motion abnormalities. Left ventricular diastolic parameters were normal.  2. Right ventricular systolic function is normal. The right ventricular size is normal.  3. The mitral valve is normal in structure. Mild mitral valve regurgitation.  4. The aortic valve is normal in structure. Aortic valve regurgitation is not visualized. FINDINGS  Left Ventricle: Left ventricular ejection fraction, by  estimation, is 60 to 65%. The left ventricle has normal function. The left ventricle has no regional wall motion abnormalities. The left ventricular internal cavity size was normal in size. There is  no left ventricular hypertrophy. Left ventricular diastolic parameters were normal. Right Ventricle: The right ventricular size is normal. No increase in right ventricular wall thickness. Right ventricular systolic function is normal. Left Atrium: Left atrial size was normal in size. Right Atrium: Right atrial size was normal in size. Pericardium: There is no evidence of pericardial effusion. Mitral Valve: The mitral valve is normal in structure. Mild mitral valve regurgitation. Tricuspid Valve: The tricuspid valve is normal in structure. Tricuspid valve regurgitation is mild. Aortic Valve: The aortic valve is normal in structure. Aortic valve regurgitation is not visualized. Aortic valve mean gradient measures 4.0 mmHg. Aortic valve peak gradient measures 7.8 mmHg. Aortic valve area, by VTI measures 1.98 cm. Pulmonic Valve: The pulmonic valve was normal in structure. Pulmonic valve regurgitation is trivial. Aorta: The aortic root and ascending aorta are structurally normal, with no evidence of dilitation. IAS/Shunts: No atrial level shunt detected by color flow Doppler. Agitated saline contrast was given intravenously to evaluate for intracardiac shunting.  LEFT VENTRICLE PLAX 2D LVIDd:         3.20 cm   Diastology LVIDs:         1.90 cm   LV e' medial:    5.66 cm/s LV PW:         0.60 cm   LV E/e' medial:  15.4 LV IVS:        1.00 cm   LV e' lateral:   8.92 cm/s LVOT diam:     1.90 cm   LV E/e' lateral: 9.8 LV SV:         58 LV SV Index:   40 LVOT Area:     2.84 cm  RIGHT VENTRICLE RV Basal diam:  3.80 cm RV Mid diam:    3.50 cm RV S prime:     19.50 cm/s TAPSE (M-mode): 2.8 cm LEFT ATRIUM           Index        RIGHT ATRIUM           Index LA diam:      2.40 cm 1.68 cm/m   RA Area:     10.20 cm LA Vol (A2C): 33.6  ml 23.48 ml/m  RA Volume:  20.70 ml  14.46 ml/m LA Vol (A4C): 11.4 ml 7.97 ml/m  AORTIC VALVE AV Area (Vmax):    2.01 cm AV Area (Vmean):   2.02 cm AV Area (VTI):     1.98 cm AV Vmax:           139.50 cm/s AV Vmean:          91.400 cm/s AV VTI:            0.291 m AV Peak Grad:      7.8 mmHg AV Mean Grad:      4.0 mmHg LVOT Vmax:         99.00 cm/s LVOT Vmean:        65.000 cm/s LVOT VTI:          0.203 m LVOT/AV VTI ratio: 0.70  AORTA Ao Root diam: 3.20 cm MITRAL VALVE               TRICUSPID VALVE MV Area (PHT): 3.08 cm    TR Peak grad:   17.1 mmHg MV Decel Time: 246 msec    TR Vmax:        207.00 cm/s MV E velocity: 87.00 cm/s MV A velocity: 89.60 cm/s  SHUNTS MV E/A ratio:  0.97        Systemic VTI:  0.20 m                            Systemic Diam: 1.90 cm Serafina Royals MD Electronically signed by Serafina Royals MD Signature Date/Time: 08/10/2022/9:02:45 AM    Final    CT ANGIO HEAD NECK W WO CM  Result Date: 08/08/2022 CLINICAL DATA:  Acute neurologic deficit EXAM: CT ANGIOGRAPHY HEAD AND NECK TECHNIQUE: Multidetector CT imaging of the head and neck was performed using the standard protocol during bolus administration of intravenous contrast. Multiplanar CT image reconstructions and MIPs were obtained to evaluate the vascular anatomy. Carotid stenosis measurements (when applicable) are obtained utilizing NASCET criteria, using the distal internal carotid diameter as the denominator. RADIATION DOSE REDUCTION: This exam was performed according to the departmental dose-optimization program which includes automated exposure control, adjustment of the mA and/or kV according to patient size and/or use of iterative reconstruction technique. CONTRAST:  84m OMNIPAQUE IOHEXOL 350 MG/ML SOLN COMPARISON:  08/07/2022 FINDINGS: CT HEAD FINDINGS Brain: There is no mass, hemorrhage or extra-axial collection. The size and configuration of the ventricles and extra-axial CSF spaces are normal. 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 There is hypoattenuation of the periventricular white matter, most commonly indicating chronic ischemic microangiopathy. Skull: The visualized skull base, calvarium and extracranial soft tissues are normal. Sinuses/Orbits: No fluid levels or advanced mucosal thickening of the visualized paranasal sinuses. No mastoid or middle ear effusion. The orbits are normal. CTA NECK FINDINGS SKELETON: There is no bony spinal canal stenosis. No lytic or blastic lesion. OTHER NECK: Normal pharynx, larynx and major salivary glands. No cervical lymphadenopathy. Unremarkable thyroid gland. UPPER CHEST: Biapical scarring AORTIC ARCH: There is calcific atherosclerosis of the aortic arch. There is no aneurysm, dissection or hemodynamically significant stenosis of the visualized portion of the aorta. Conventional 3 vessel aortic branching pattern. The visualized proximal subclavian arteries are widely patent. RIGHT CAROTID SYSTEM: No dissection, occlusion or aneurysm. Mild atherosclerotic calcification at the carotid bifurcation without hemodynamically significant stenosis. LEFT CAROTID SYSTEM: No dissection, occlusion or aneurysm. Mild atherosclerotic calcification at the carotid bifurcation without hemodynamically significant stenosis. VERTEBRAL  ARTERIES: Left dominant configuration. Both origins are clearly patent. There is no dissection, occlusion or flow-limiting stenosis to the skull base (V1-V3 segments). CTA HEAD FINDINGS POSTERIOR CIRCULATION: --Vertebral arteries: Normal V4 segments. --Inferior cerebellar arteries: Normal. --Basilar artery: Moderate stenosis of the distal basilar artery. --Superior cerebellar arteries: Normal. --Posterior cerebral arteries (PCA): Short segment severe stenosis/occlusion of the left P1 segment with reconstitution. Severe stenosis or short segment occlusion of the proximal left P3 segment. Right PCA is normal. There is a fetal predominant origin of the right PCA  and a diminutive left P-comm. ANTERIOR CIRCULATION: --Intracranial internal carotid arteries: Normal. --Anterior cerebral arteries (ACA): Normal. Both A1 segments are present. Patent anterior communicating artery (a-comm). --Middle cerebral arteries (MCA): Normal. VENOUS SINUSES: As permitted by contrast timing, patent. ANATOMIC VARIANTS: Fetal origin of the right posterior cerebral artery. Review of the MIP images confirms the above findings. IMPRESSION: 1. Short segment severe stenosis/occlusion of the left P1 segment with reconstitution. 2. Severe stenosis or short segment occlusion of the proximal left P3 segment. 3. Moderate stenosis of the distal basilar artery. 4. 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 5. Mild bilateral carotid bifurcation atherosclerosis without hemodynamically significant stenosis. Electronically Signed   By: Ulyses Jarred M.D.   On: 08/08/2022 19:02   US Venous Img Lower Bilateral (DVT)  Result Date: 08/08/2022 CLINICAL DATA:  Embolic stroke. Hypercoagulable. Clinical concern for DVT. EXAM: BILATERAL LOWER EXTREMITY VENOUS DOPPLER ULTRASOUND TECHNIQUE: Gray-scale sonography with compression, as well as color and duplex ultrasound, were performed to evaluate the deep venous system(s) from the level of the common femoral vein through the popliteal and proximal calf veins. COMPARISON:  None Available. FINDINGS: VENOUS Normal compressibility of the common femoral, superficial femoral, and popliteal veins, as well as the visualized calf veins. Visualized portions of profunda femoral vein and great saphenous vein unremarkable. No filling defects to suggest DVT on grayscale or color Doppler imaging. Doppler waveforms show normal direction of venous flow, normal respiratory plasticity and response to augmentation. Limited views of the contralateral common femoral vein are unremarkable. OTHER None. Limitations: Left peroneal vein not visualized. IMPRESSION: No evidence of a right  or left lower extremity deep venous thrombosis. Electronically Signed   By: Lajean Manes M.D.   On: 08/08/2022 18:17   NM Pulmonary Perfusion  Result Date: 08/08/2022 CLINICAL DATA:  Pulmonary embolism suspected. EXAM: NUCLEAR MEDICINE PERFUSION LUNG SCAN TECHNIQUE: Perfusion images were obtained in multiple projections after intravenous injection of radiopharmaceutical. Ventilation scans intentionally deferred if perfusion scan and chest x-ray adequate for interpretation during COVID 19 epidemic. RADIOPHARMACEUTICALS:  4.1 mCi Tc-13mMAA IV COMPARISON:  Chest x-ray earlier same day FINDINGS: Heterogeneous lung perfusion identified in the upper lobes bilaterally and in both lung bases. No definite peripheral wedge-shaped filling defect in either lung. IMPRESSION: Lung perfusion scan indeterminate for acute pulmonary embolus. Heterogeneous perfusion may reflect underlying emphysema. Consider chest CTA to further evaluate if patient renal function permits. Electronically Signed   By: EMisty StanleyM.D.   On: 08/08/2022 10:40   DG Chest Port 1 View  Result Date: 08/08/2022 CLINICAL DATA:  Dyspnea EXAM: PORTABLE CHEST 1 VIEW COMPARISON:  CT 08/07/2022 FINDINGS: The heart size and mediastinal contours are within normal limits. Reticulonodular opacities within the periphery of the right lower lobe, similar to the recent previous CT. No new airspace consolidation. No pleural effusion or pneumothorax. Calcified granuloma at the left lung base. The visualized skeletal structures are unremarkable. IMPRESSION: Unchanged reticulonodular opacities within the periphery  of the right lower lobe. No new airspace consolidation. Electronically Signed   By: Davina Poke D.O.   On: 08/08/2022 09:13   MR Brain W and Wo Contrast  Result Date: 08/07/2022 CLINICAL DATA:  Dizziness EXAM: MRI HEAD WITHOUT AND WITH CONTRAST TECHNIQUE: Multiplanar, multiecho pulse sequences of the brain and surrounding structures were  obtained without and with intravenous contrast. CONTRAST:  65m GADAVIST GADOBUTROL 1 MMOL/ML IV SOLN COMPARISON:  08/25/2019 FINDINGS: Brain: Scattered punctate foci of acute ischemia within both cerebellar hemispheres, left basal ganglia and both frontal lobes. Pattern is most consistent with emboli. No acute or chronic hemorrhage. There is multifocal hyperintense T2-weighted signal within the white matter. Parenchymal volume and CSF spaces are normal. The midline structures are normal. There is no abnormal contrast enhancement. Vascular: Major flow voids are preserved. Skull and upper cervical spine: Normal calvarium and skull base. Visualized upper cervical spine and soft tissues are normal. Sinuses/Orbits:No paranasal sinus fluid levels or advanced mucosal thickening. No mastoid or middle ear effusion. Normal orbits. IMPRESSION: Scattered punctate foci of acute ischemia within both cerebellar hemispheres, left basal ganglia and both frontal lobes. Pattern is most consistent with emboli, most likely from a cardiac or proximal aortic source. No hemorrhage or mass effect. Electronically Signed   By: KUlyses JarredM.D.   On: 08/07/2022 21:22   CT HEAD WO CONTRAST  Result Date: 08/07/2022 CLINICAL DATA:  Dizziness on Monday followed by vision changes on Wednesday EXAM: CT HEAD WITHOUT CONTRAST TECHNIQUE: Contiguous axial images were obtained from the base of the skull through the vertex without intravenous contrast. RADIATION DOSE REDUCTION: This exam was performed according to the departmental dose-optimization program which includes automated exposure control, adjustment of the mA and/or kV according to patient size and/or use of iterative reconstruction technique. COMPARISON:  Brain MRI 08/25/2019 FINDINGS: Brain: There is no acute intracranial hemorrhage, extra-axial fluid collection, or acute territorial infarct. Parenchymal volume is normal for age. The ventricles are normal in size. Gray-white  differentiation is preserved. There is a small remote lacunar infarct in the left caudate head, new since the prior MRI from 2020. There is no mass lesion.  There is no mass effect or midline shift. Vascular: No hyperdense vessel or unexpected calcification. Skull: Normal. Negative for fracture or focal lesion. Sinuses/Orbits: The imaged paranasal sinuses are clear. Bilateral lens implants are in place. The globes and orbits are otherwise unremarkable. Other: None. IMPRESSION: 1. No acute intracranial pathology. 2. Small infarct in the left caudate head is new since the brain MRI from 2020 but is remote in appearance. Otherwise, unremarkable for age head CT. Electronically Signed   By: PValetta MoleM.D.   On: 08/07/2022 16:52   CT CHEST ABDOMEN PELVIS W CONTRAST  Result Date: 08/07/2022 CLINICAL DATA:  History of endometrial cancer, rising CA 125. * Tracking Code: BO * EXAM: CT CHEST, ABDOMEN, AND PELVIS WITH CONTRAST TECHNIQUE: Multidetector CT imaging of the chest, abdomen and pelvis was performed following the standard protocol during bolus administration of intravenous contrast. RADIATION DOSE REDUCTION: This exam was performed according to the departmental dose-optimization program which includes automated exposure control, adjustment of the mA and/or kV according to patient size and/or use of iterative reconstruction technique. CONTRAST:  845mOMNIPAQUE IOHEXOL 300 MG/ML  SOLN COMPARISON:  CT December 11, 2021. FINDINGS: CT CHEST FINDINGS Cardiovascular: Aortic atherosclerosis. Normal caliber thoracic aorta. No central pulmonary embolus on this nondedicated study. Coronary artery calcifications. Normal size heart. No significant pericardial effusion/thickening. Mediastinum/Nodes: No supraclavicular  adenopathy. No discrete thyroid nodule. No pathologically enlarged mediastinal, hilar or axillary lymph nodes. Unchanged calcified nodal tissue in the subcarinal space. The esophagus is grossly unremarkable.  Lungs/Pleura: Biapical pleuroparenchymal scarring. Bronchiectasis with peripheral airway impaction noted in the right lower lobe on image 93/5 with adjacent tree-in-bud nodules. Additional tree-in-bud nodularity in the right middle lobe, lingula and scattered throughout the right lower lobe. Similar ground-glass opacities in the left lower lobe for instance on image 111/5 measuring 7 mm and 95/5 measuring 5 mm, unchanged. No new suspicious pulmonary nodules or masses. No pleural effusion. No pneumothorax. Musculoskeletal: Biopsy clip in the right breast. No aggressive lytic or blastic lesion of bone. Multilevel degenerative changes spine. Diffuse demineralization of bone. CT ABDOMEN PELVIS FINDINGS Hepatobiliary: No suspicious hepatic lesion. Gallbladder is unremarkable. Common bile duct measures 5 mm, unchanged Pancreas: No pancreatic ductal dilation or evidence of acute inflammation. Spleen: Calcified hepatic granulomata. Adrenals/Urinary Tract: Bilateral adrenal glands appear normal. No hydronephrosis. Kidneys demonstrate symmetric enhancement and excretion of contrast material. Urinary bladder is unremarkable for degree of distension Stomach/Bowel: Radiopaque enteric contrast material traverses the hepatic flexure. Stomach is unremarkable for degree of distension. No pathologic dilation of small or large bowel. Gas fluid levels throughout the colon suggestive of diarrheal illness. Vascular/Lymphatic: Aortic atherosclerosis. No pathologically enlarged abdominal or pelvic lymph nodes. Reproductive: Uterus is surgically absent, no soft tissue nodularity along the vaginal cuff. No suspicious adnexal mass Other: Mild mesenteric edema with some heterogeneity of the omental fat, similar prior. Trace fluid in the pelvis for instance a focal area of fluid in the posterior left hemipelvis on image 111/2. Mild pelvic peritoneal thickening. No discrete peritoneal or omental nodularity. Musculoskeletal: No aggressive lytic  or blastic lesion of bone. Degenerative grade 1 L4 on L5 anterolisthesis. Multilevel degenerative changes spine. Diffuse demineralization of bone. IMPRESSION: 1. Status post hysterectomy without soft tissue nodularity along the vaginal cuff. 2. Trace pelvic free fluid and pelvic peritoneal thickening with similar mild mesenteric edema and heterogeneity of the omental fat. No discrete peritoneal or omental nodularity. 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 Consider attention on short-term interval follow-up imaging. 3. Bronchiectasis with peripheral airway impaction in the right lower lobe and adjacent tree-in-bud nodules as well as additional tree-in-bud nodularity in the right middle lobe, lingula and scattered throughout the right lower lobe. Findings are favored to reflect an atypical infectious or inflammatory process such as atypical mycobacterial infection. 4. Stable ground-glass opacities in the left lower lobe measuring up to 7 mm, nonspecific possibly reflecting an infectious or inflammatory process. Attention on follow-up imaging suggested. 5. Gas fluid levels throughout the colon as can be seen with diarrheal illness 6.  Aortic Atherosclerosis (ICD10-I70.0). Electronically Signed   By: Dahlia Bailiff M.D.   On: 08/07/2022 12:11    Microbiology: Results for orders placed or performed during the hospital encounter of 08/07/22  Resp Panel by RT-PCR (Flu A&B, Covid) Anterior Nasal Swab     Status: None   Collection Time: 08/07/22  2:58 PM   Specimen: Anterior Nasal Swab  Result Value Ref Range Status   SARS Coronavirus 2 by RT PCR NEGATIVE NEGATIVE Final    Comment: (NOTE) SARS-CoV-2 target nucleic acids are NOT DETECTED.  The SARS-CoV-2 RNA is generally detectable in upper respiratory specimens during the acute phase of infection. The lowest concentration of SARS-CoV-2 viral copies this assay can detect is 138 copies/mL. A negative result does not preclude  SARS-Cov-2 infection and should not be used as the sole basis  for treatment or other patient management decisions. A negative result may occur with  improper specimen collection/handling, submission of specimen other than nasopharyngeal swab, presence of viral mutation(s) within the areas targeted by this assay, and inadequate number of viral copies(<138 copies/mL). A negative result must be combined with clinical observations, patient history, and epidemiological information. The expected result is Negative.  Fact Sheet for Patients:  EntrepreneurPulse.com.au  Fact Sheet for Healthcare Providers:  IncredibleEmployment.be  This test is no t yet approved or cleared by the [place] FDA and  has been authorized for detection and/or diagnosis of SARS-CoV-2 by FDA under an Emergency Use Authorization (EUA). This EUA will remain  in effect (meaning this test can be used) for the duration of the COVID-19 declaration under Section 564(b)(1) of the Act, 21 U.S.C.section 360bbb-3(b)(1), unless the authorization is terminated  or revoked sooner.       Influenza A by PCR NEGATIVE NEGATIVE Final   Influenza B by PCR NEGATIVE NEGATIVE Final    Comment: (NOTE) The Xpert Xpress SARS-CoV-2/FLU/RSV plus assay is intended as an aid in the diagnosis of influenza from Nasopharyngeal swab specimens and should not be used as a sole basis for treatment. Nasal washings and aspirates are unacceptable for Xpert Xpress SARS-CoV-2/FLU/RSV testing.  Fact Sheet for Patients: EntrepreneurPulse.com.au  Fact Sheet for Healthcare Providers: IncredibleEmployment.be  This test is not yet approved or cleared by the [place] FDA and has been authorized for detection and/or diagnosis of SARS-CoV-2 by FDA under an Emergency Use Authorization (EUA). This EUA will remain in effect (meaning this test can be used) for the duration of  the COVID-19 declaration under Section 564(b)(1) of the Act, 21 U.S.C. section 360bbb-3(b)(1), unless the authorization is terminated or revoked.  Performed at [facility], [address] [place], [place] [address]     Labs: CBC: Recent Labs  Lab 08/13/22 1715 08/17/22 1643 08/18/22 0406 08/19/22 0443 08/20/22 0441  WBC 4.6 7.1 5.4 5.9 5.4  NEUTROABS 2.8  --  3.4  --   --   HGB 11.0* 9.4* 9.6* 9.8* 9.8*  HCT 32.5* 27.2* 28.2* 28.9* 28.9*  MCV 97.6 96.8 96.9 95.7 97.3  PLT 125* 127* 129* 140* 722*   Basic Metabolic Panel: Recent Labs  Lab 08/13/22 1715 08/16/22 0432 08/19/22 0443  NA 131* 138 138  K 4.1 3.6 3.6  CL 100 111 109  CO2 '23 23 24  '$ GLUCOSE 91 99 98  BUN '18 14 11  '$ CREATININE 0.87 0.88 0.76  CALCIUM 9.2 9.2 9.1   Liver Function Tests: Recent Labs  Lab 08/13/22 1715  AST 22  ALT 17  ALKPHOS 95  BILITOT 0.6  PROT 6.9  ALBUMIN 4.1   CBG: Recent Labs  Lab 08/16/22 0831  GLUCAP 85    Discharge time spent: greater than 30 minutes.  Signed: Val Riles, MD [facility] 08/20/2022

## 2022-08-20 NOTE — Progress Notes (Signed)
[facility] admissions - I have called [facility] for pick up.  Patient will admit to [facility] [place] [facility] at [facility].  Please have nurse call report to [phone].  Call me for any questions.  [phone]

## 2022-08-20 NOTE — Progress Notes (Signed)
Physical Therapy Treatment Patient Details Name: Elizabeth Friedman MRN: [id number] DOB: 01-31-1941 Today's Date: 08/20/2022   History of Present Illness Pt is an 81 year old female admitted  with recurrent subacute to acute CVA, concerning for central involvement source after presenting to ED with dizziness and right-sided paresthesias; PMH significant for past history of breast and uterine cancer, recurrent CVA, dyslipidemia, GERD, Osteoarthritis, vitamin B12 deficiency and migraine. 10/24 update: Pt now with confirmed extension of CVA, new LUE deficits and L visual field deficits, new balance deficits.    PT Comments    Pt is making great progress towards goals with short distance ambulation without AD with heavy cues for sequencing, gait pattern. Able to perform seated and standing there-ex. Plan for admit to CIR this date.    Recommendations for follow up therapy are one component of a multi-disciplinary discharge planning process, led by the attending physician.  Recommendations may be updated based on patient status, additional functional criteria and insurance authorization.  Follow Up Recommendations  Acute inpatient rehab (3hours/day) Can patient physically be transported by private vehicle: Yes   Assistance Recommended at Discharge Intermittent Supervision/Assistance  Patient can return home with the following Assist for transportation;Help with stairs or ramp for entrance;A little help with walking and/or transfers;A little help with bathing/dressing/bathroom   Equipment Recommendations       Recommendations for Other Services       Precautions / Restrictions Precautions Precautions: Fall Restrictions Weight Bearing Restrictions: No     Mobility  Bed Mobility               General bed mobility comments: received and left sitting    Transfers Overall transfer level: Needs assistance Equipment used: Rolling walker (2 wheels) Transfers: Sit to/from Stand Sit to  Stand: Supervision           General transfer comment: cues for sequencing and technique. Once standing, tends to brace with B LEs against back of chair. Cues for upright posture    Ambulation/Gait Ambulation/Gait assistance: Min assist, Mod assist Gait Distance (Feet): 10 Feet Assistive device: None Gait Pattern/deviations: Step-to pattern, Shuffle       General Gait Details: follows commands, however delayed processing noted. Shuffle gait pattern performed with decreased step length noted on B LEs.   Stairs             Wheelchair Mobility    Modified Rankin (Stroke Patients Only)       Balance Overall balance assessment: Needs assistance Sitting-balance support: No upper extremity supported, Feet supported Sitting balance-Leahy Scale: Good     Standing balance support: During functional activity, No upper extremity supported Standing balance-Leahy Scale: Fair                              Cognition Arousal/Alertness: Awake/alert Behavior During Therapy: WFL for tasks assessed/performed Overall Cognitive Status: Within Functional Limits for tasks assessed                                 General Comments: increased cues to attention to instructions on L hemibody- increased processing time        Exercises Other Exercises Other Exercises: Pt performed standing ther-ex with B hand support including hip add and B UE reaching/grabbing cups. Cues for L knee extension during stance phase. 10 reps performed Other Exercises: Seated ther-ex performed on B LE  including hip add squeezes with resistance, resisted hip abd, and resisted LAQ. 10 reps performed    General Comments        Pertinent Vitals/Pain Pain Assessment Pain Assessment: No/denies pain    Home Living                          Prior Function            PT Goals (current goals can now be found in the care plan section) Acute Rehab PT Goals Patient Stated  Goal: get back home PT Goal Formulation: With patient Time For Goal Achievement: 08/28/22 Potential to Achieve Goals: Good Progress towards PT goals: Progressing toward goals    Frequency    7X/week      PT Plan Current plan remains appropriate    Co-evaluation              AM-PAC PT "6 Clicks" Mobility   Outcome Measure  Help needed turning from your back to your side while in a flat bed without using bedrails?: A Little Help needed moving from lying on your back to sitting on the side of a flat bed without using bedrails?: A Little Help needed moving to and from a bed to a chair (including a wheelchair)?: A Little Help needed standing up from a chair using your arms (e.g., wheelchair or bedside chair)?: A Little Help needed to walk in hospital room?: A Little Help needed climbing 3-5 steps with a railing? : A Lot 6 Click Score: 17    End of Session Equipment Utilized During Treatment: Gait belt Activity Tolerance: Patient tolerated treatment well Patient left: in chair;with family/visitor present Nurse Communication: Mobility status PT Visit Diagnosis: Muscle weakness (generalized) (M62.81);Ataxic gait (R26.0);Difficulty in walking, not elsewhere classified (R26.2);Unsteadiness on feet (R26.81)     Time: 8453-6468 PT Time Calculation (min) (ACUTE ONLY): 36 min  Charges:  $Therapeutic Exercise: 23-37 mins                     Greggory Stallion, PT, DPT, [facility] [phone]    Elizabeth Friedman 08/20/2022, 12:59 PM

## 2022-08-20 NOTE — Progress Notes (Addendum)
Occupational Therapy Treatment Patient Details Name: Elizabeth Friedman MRN: [id number] DOB: 1941/10/09 Today's Date: 08/20/2022   History of present illness Pt is an 81 year old female admitted  with recurrent subacute to acute CVA, concerning for central involvement source after presenting to ED with dizziness and right-sided paresthesias; PMH significant for past history of breast and uterine cancer, recurrent CVA, dyslipidemia, GERD, Osteoarthritis, vitamin B12 deficiency and migraine. 10/24 update: Pt now with confirmed extension of CVA, new LUE deficits and L visual field deficits, new balance deficits.   OT comments  Elizabeth Friedman was seen for OT treatment on this date. Upon arrival to room pt seated on toilet, agreeable to tx. Pt requires SUPERVISION toileting with lateral leans for pericare. CGA + MIN cues hand washing, cues for thouroughly washing L hand. MIN A + L HHA for functional mobility, assist for balance (L lateral lean and narrow BOS).   Green theraputty provided and pinch/grip exercises completed with pt and family educated on HEP. Pt completed [name] neglect test with 99% accuracy however notably utilizing scanning strategies as pt initially identifies R and middle lines only. Goals updated to reflect progress, discharge recommendation remains appropriate.     Recommendations for follow up therapy are one component of a multi-disciplinary discharge planning process, led by the attending physician.  Recommendations may be updated based on patient status, additional functional criteria and insurance authorization.    Follow Up Recommendations  Acute inpatient rehab (3hours/day)    Assistance Recommended at Discharge Frequent or constant Supervision/Assistance  Patient can return home with the following  A lot of help with walking and/or transfers;A lot of help with bathing/dressing/bathroom;Assistance with cooking/housework;Direct supervision/assist for medications management;Direct  supervision/assist for financial management;Assist for transportation;Help with stairs or ramp for entrance   Equipment Recommendations  Other (comment) (defer)    Recommendations for Other Services      Precautions / Restrictions Precautions Precautions: Fall Restrictions Weight Bearing Restrictions: No       Mobility Bed Mobility               General bed mobility comments: received and left sitting    Transfers Overall transfer level: Needs assistance Equipment used: Rolling walker (2 wheels) Transfers: Sit to/from Stand Sit to Stand: Min guard                 Balance Overall balance assessment: Needs assistance Sitting-balance support: No upper extremity supported, Feet supported Sitting balance-Leahy Scale: Good     Standing balance support: During functional activity, No upper extremity supported Standing balance-Leahy Scale: Fair                             ADL either performed or assessed with clinical judgement   ADL Overall ADL's : Needs assistance/impaired                                       General ADL Comments: SUPERVISION toileting with lateral leans for pericare. CGA + MIN cues hand washing, cues for thouroughly washing L hand. MIN A + L HHA for functional mobility ~80 ft.       Cognition Arousal/Alertness: Awake/alert Behavior During Therapy: WFL for tasks assessed/performed Overall Cognitive Status: Within Functional Limits for tasks assessed  General Comments: ? HOH vs cognition deficits. MIN cues for LUE attention        Exercises Exercises: Other exercises Other Exercises Other Exercises: Green theraputty provided and pinch/grip exercises completed            Pertinent Vitals/ Pain       Pain Assessment Pain Assessment: No/denies pain   Frequency  Min 5X/week        Progress Toward Goals  OT Goals(current goals can now be found in  the care plan section)  Progress towards OT goals: Goals met and updated - see care plan  Acute Rehab OT Goals Patient Stated Goal: return to PLOF OT Goal Formulation: With patient/family Time For Goal Achievement: 08/28/22 Potential to Achieve Goals: Good ADL Goals Pt Will Perform Grooming: Independently;standing Pt Will Perform Lower Body Dressing: Independently;sit to/from stand Pt Will Transfer to Toilet: Independently;ambulating;regular height toilet Pt Will Perform Toileting - Clothing Manipulation and hygiene: Independently;sitting/lateral leans  Plan Discharge plan remains appropriate;Frequency needs to be updated    Co-evaluation                 AM-PAC OT "6 Clicks" Daily Activity     Outcome Measure   Help from another person eating meals?: A Little Help from another person taking care of personal grooming?: A Little Help from another person toileting, which includes using toliet, bedpan, or urinal?: A Little Help from another person bathing (including washing, rinsing, drying)?: A Lot Help from another person to put on and taking off regular upper body clothing?: A Little Help from another person to put on and taking off regular lower body clothing?: A Lot 6 Click Score: 16    End of Session Equipment Utilized During Treatment: Rolling walker (2 wheels);Gait belt  OT Visit Diagnosis: Unsteadiness on feet (R26.81);Muscle weakness (generalized) (M62.81)   Activity Tolerance Patient tolerated treatment well   Patient Left in chair;with call bell/phone within reach;with family/visitor present   Nurse Communication Mobility status        Time: 5913-6859 OT Time Calculation (min): 42 min  Charges: OT General Charges $OT Visit: 1 Visit OT Treatments $Self Care/Home Management : 8-22 mins $Therapeutic Activity: 8-22 mins $Therapeutic Exercise: 8-22 mins  Dessie Coma, M.S. OTR/L  08/20/22, 10:25 AM  ascom [phone]

## 2022-08-20 NOTE — H&P (Signed)
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  She also finished adjuvant radiation approximately 1 month ago.  Per chart review patient lives alone.  1 level home 3 steps to entry.  Ambulates with a rollator since latest admission.  She does have good family support.  Presented to [facility] 08/13/2022 with increasing dizziness, left-sided weakness and loss of balance and slurred speech.  Most recent echocardiogram ejection fraction of 60 to 65% no wall motion abnormalities.  CT/MRI follow-up showed interval extension of infarction on the right hemisphere, now affecting the deep insula and more the frontal parietal cortical and subcortical brain consistent with progressive right MCA branch vessel infarction.  No evidence of hemorrhage or mass effect.  Newly seen subcentimeter focus of acute infarct in the left frontal white matter.  Underlying chronic microvascular ischemic disease with a few small remote lacunar infarcts.  MRI cervical spine with no cord insult.  CT angiogram of the chest showed no segmental or pulmonary emboli.  Lower extremity Dopplers negative for DVT.  Admission chemistries unremarkable except sodium 131, troponin 89 D-dimer elevated 10.67.  Follow-up TEE negative.  Follow-up neurology services in light of recurrent CVA concern for hypercoagulopathy in the setting of malignancy was placed on low-grade heparin and transitioned to Eliquis and would also remain on low.  Maintain on a regular consistency diet.  Therapy evaluations completed due to patient's left-sided weakness  decreased functional mobility was admitted for a comprehensive rehab program.   Review of Systems  Constitutional:  Negative for chills and fever.  HENT:  Positive for hearing loss.   Eyes:  Negative for blurred vision and double vision.  Respiratory:  Negative for cough, shortness of breath and wheezing.   Cardiovascular:  Negative for chest pain, palpitations and leg swelling.  Gastrointestinal:  Positive for constipation. Negative for heartburn, nausea and vomiting.       GERD  Genitourinary:  Negative for dysuria, flank pain and hematuria.  Musculoskeletal:  Positive for joint pain and myalgias.  Skin:  Negative for rash.  Neurological:  Positive for dizziness, weakness and headaches.  All other systems reviewed and are negative.       Past Medical History:  Diagnosis Date   Allergic rhinitis     Anemia     Anginal pain (HCC)     Arthritis     Atypical chest pain     Breast cancer ([place])     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[place])     Ductal carcinoma in situ (DCIS) of right breast     Fibrocystic breast disease     GERD (gastroesophageal reflux disease)     Hearing aid worn      bilateral   History of hiatal hernia     Hyperlipidemia     Lung nodule     Menopause     Migraine     Migraine      optical migraines   Motion sickness     Plantar fasciitis     Prolapse of female pelvic organs      hodge pessary   Raynaud's  disease     Sciatic leg pain     Sciatica     Skin cancer of face     Stroke [facility])     Urinary retention with incomplete bladder emptying     Uterine carcinosarcoma ([place]) 11/2021   Vitreous detachment of both eyes           Past Surgical History:  Procedure Laterality Date   APPENDECTOMY   1951   BREAST LUMPECTOMY WITH RADIOACTIVE SEED LOCALIZATION Right 05/29/2020    Procedure: RIGHT BREAST LUMPECTOMY WITH RADIOACTIVE SEED LOCALIZATION;  Surgeon: Rolm Bookbinder, MD;  Location: [place];  Service: General;  Laterality: Right;   CARDIAC  CATHETERIZATION   2004    negative   CATARACT EXTRACTION W/PHACO Right 02/26/2021    Procedure: CATARACT EXTRACTION PHACO AND INTRAOCULAR LENS PLACEMENT (IOC) RIGHT 5.86 01:19.6 7.4%;  Surgeon: Leandrew Koyanagi, MD;  Location: [place];  Service: Ophthalmology;  Laterality: Right;   CATARACT EXTRACTION W/PHACO Left 09/03/2021    Procedure: CATARACT EXTRACTION PHACO AND INTRAOCULAR LENS PLACEMENT ([place]) LEFT;  Surgeon: Leandrew Koyanagi, MD;  Location: [place];  Service: Ophthalmology;  Laterality: Left;  9.61 01:15.9   COLONOSCOPY        2000, 2006, 2012   COLONOSCOPY WITH PROPOFOL N/A 03/27/2016    Procedure: COLONOSCOPY WITH PROPOFOL;  Surgeon: Manya Silvas, MD;  Location: [facility];  Service: Endoscopy;  Laterality: N/A;   COLONOSCOPY WITH PROPOFOL N/A 08/14/2020    Procedure: COLONOSCOPY WITH PROPOFOL;  Surgeon: Toledo, Benay Pike, MD;  Location: [facility];  Service: Gastroenterology;  Laterality: N/A;   CYSTOSCOPY N/A 12/24/2021    Procedure: CYSTOSCOPY;  Surgeon: Jaquita Folds, MD;  Location: [facility];  Service: Gynecology;  Laterality: N/A;   DILATATION & CURETTAGE/HYSTEROSCOPY WITH MYOSURE N/A 12/01/2021    Procedure: DILATATION & CURETTAGE/HYSTEROSCOPY WITH MYOSURE;  Surgeon: Rubie Maid, MD;  Location: [facility];  Service: Gynecology;  Laterality: N/A;   ESOPHAGOGASTRODUODENOSCOPY   08/1999   ESOPHAGOGASTRODUODENOSCOPY (EGD) WITH PROPOFOL N/A 08/14/2020    Procedure: ESOPHAGOGASTRODUODENOSCOPY (EGD) WITH PROPOFOL;  Surgeon: Toledo, Benay Pike, MD;  Location: [facility];  Service: Gastroenterology;  Laterality: N/A;   ESOPHAGOGASTRODUODENOSCOPY (EGD) WITH PROPOFOL N/A 03/21/2021    Procedure: ESOPHAGOGASTRODUODENOSCOPY (EGD) WITH PROPOFOL;  Surgeon: Toledo, Benay Pike, MD;  Location: [facility];  Service: Gastroenterology;  Laterality: N/A;   PORTA CATH INSERTION N/A 01/15/2022    Procedure: PORTA CATH INSERTION;  Surgeon: Algernon Huxley, MD;  Location: [place] [facility];  Service: Cardiovascular;  Laterality: N/A;   PORTA CATH REMOVAL N/A 06/15/2022    Procedure: PORTA CATH REMOVAL;  Surgeon: Algernon Huxley, MD;  Location: [place] [facility];  Service: Cardiovascular;  Laterality: N/A;   SKIN CANCER EXCISION        face and neck   TEE WITHOUT CARDIOVERSION N/A 08/17/2022    Procedure: TRANSESOPHAGEAL ECHOCARDIOGRAM (TEE);  Surgeon: Dionisio David, MD;  Location: [facility];  Service: Cardiovascular;  Laterality: N/A;   TOTAL LAPAROSCOPIC HYSTERECTOMY WITH BILATERAL SALPINGO OOPHORECTOMY Bilateral 12/24/2021    Procedure: TOTAL LAPAROSCOPIC HYSTERECTOMY WITH BILATERAL SALPINGO OOPHORECTOMY, PELVIC NODE BIOPSY,  PESSARY REMOVAL (PESSARY REMOVAL BY DR. Wannetta Sender;  Surgeon: Mellody Drown, MD;  Location: [facility];  Service: Gynecology;  Laterality: Bilateral;         Family History  Problem Relation Age of Onset   Breast cancer Mother     Heart disease Father     Colon cancer Brother  Diabetes Neg Hx     Ovarian cancer Neg Hx      Social History:  reports that she has never smoked. She has never used smokeless tobacco. She reports that she does not drink alcohol and does not use drugs. Allergies: No Known Allergies       Medications Prior to Admission  Medication Sig Dispense Refill   ascorbic acid (VITAMIN C) 250 MG tablet Take 1 tablet (250 mg total) by mouth daily. 30 tablet 0   aspirin 81 MG chewable tablet Chew 1 tablet (81 mg total) by mouth daily. 30 tablet 0   atorvastatin (LIPITOR) 40 MG tablet Take 1 tablet (40 mg total) by mouth daily. 30 tablet 0   Calcium-Vitamin D (CALTRATE 600 PLUS-VIT D PO) Take 1 tablet by mouth in the morning.       Carboxymethylcellulose Sod PF 1 % GEL Place 1 drop into both eyes at bedtime.       celecoxib (CELEBREX) 200 MG capsule Take 200 mg by mouth 2 (two) times daily.       Cholecalciferol (VITAMIN D) 125 MCG (5000 UT) CAPS Take 5,000 Units by mouth in the morning.        clopidogrel (PLAVIX) 75 MG tablet Take 1 tablet (75 mg total) by mouth daily. PLAVIX + ASPIRIN FOR 90 DAYS, THEN ASPIRIN ALONE INDEFINITELY 90 tablet 0   Coenzyme Q10 200 MG TABS Take 200 mg by mouth in the morning.       Cyanocobalamin (VITAMIN B-12 PO) Take 1 tablet by mouth in the morning.       Ferrous Fumarate (HEMOCYTE - 106 MG FE) 324 (106 Fe) MG TABS tablet Take 1 tablet (106 mg of iron total) by mouth daily. 30 tablet 0   gabapentin (NEURONTIN) 400 MG capsule Take 1 capsule (400 mg total) by mouth 2 (two) times daily.       pantoprazole (PROTONIX) 40 MG tablet Take 40 mg by mouth in the morning.       RESTASIS 0.05 % ophthalmic emulsion Place 1 drop into both eyes 2 (two) times daily.       topiramate (TOPAMAX) 50 MG tablet Take 50 mg by mouth every evening.       vitamin E 180 MG (400 UNITS) capsule Take 400 Units by mouth in the morning.       acetaminophen (TYLENOL) 500 MG tablet Take 1,000 mg by mouth every 6 (six) hours as needed for moderate pain or mild pain.       alum & mag hydroxide-simeth (MAALOX/MYLANTA) 200-200-20 MG/5ML suspension Take 30 mLs by mouth every 6 (six) hours as needed for indigestion or heartburn. (Patient not taking: Reported on 08/05/2022)       magnesium hydroxide (MILK OF MAGNESIA) 400 MG/5ML suspension Take by mouth daily as needed for mild constipation.       Triamcinolone Acetonide (NASACORT ALLERGY 24HR NA) Place 1 spray into the nose daily as needed (sinus congestion).              Home: Home Living Family/patient expects to be discharged to:: Private residence Living Arrangements: Alone Available Help at Discharge: Family, Available 24 hours/day Type of Home: House Home Access: Stairs to enter [employer] of Steps: 2-3 steps on the side with pull bar Entrance Stairs-Rails: Right Home Layout: One level Bathroom Shower/Tub: Tub/shower unit, Multimedia programmer: Standard Bathroom Accessibility: Yes Home Equipment:  Shower seat - built in, Hand held shower head, Rollator (4 wheels)  Functional History: Prior Function Prior Level of Function : Independent/Modified Independent, Driving Mobility Comments: amb with rollator since previous admission ADLs Comments: prior to previous admission, indep in ADL/IADL; light assist for ADLs, assist for IADLs after previous admission   Functional Status:  Mobility: Bed Mobility Overal bed mobility: Needs Assistance Bed Mobility: Sit to Supine Supine to sit: Min guard Sit to supine: Min guard General bed mobility comments: received and left sitting Transfers Overall transfer level: Needs assistance Equipment used: Rolling walker (2 wheels) Transfers: Sit to/from Stand Sit to Stand: Min guard General transfer comment: responds well to cues for pushing from seated surface. Needs min assist for L hand to place on RW, however is then able to grip handle. Once standing, tends to weight shift to R and needs frequent cues for attention to L hemibody. Ambulation/Gait Ambulation/Gait assistance: Min guard, Min assist Gait Distance (Feet): 60 Feet Assistive device: Rolling walker (2 wheels) Gait Pattern/deviations: Step-to pattern, Decreased step length - right, Decreased step length - left General Gait Details: follows commands well with inconsistencies noted including 1 instance of scissoring gait. Still demonstrates narrow BOS. Very slow gait speed and cues to keep RW close to body. RW used with tactile cues for L knee extension during stance time due to L LE buckling. Gait velocity: slightly decreased General stair comments: will attempt at later session if possible, but has railing on the R at home if necessary.   ADL: ADL Overall ADL's : Needs assistance/impaired Grooming: Oral care, Cueing for compensatory techniques, Minimal assistance, Standing (CGA-MOD A standing balance at sink today x10 minutes) Grooming Details (indicate cue type and reason): Pt  requiring cues for adaptive techniques at sink; pt statically holding top of toothpaste with L fingers and twisting entire tube with R hand. Pt attempting to squeeze tube with L hand grip to place toothpaste onto brush (which she was holding in R hand); L grip not strong enough to expel toothpaste; OT suggested placing tube on the countertop with opening over sink and pushing down with L hand, which pt did successfully. Pt required OT verbal cues for moving toothpaste tube with L hand and then releasing with L hand and placing L hand on countertop for balance assist. While standing and brushing, pt leaning forward and L, requiring CGA and eventually MOD A as she fatigued. Able to stand for total of 10 minutes. Lower Body Dressing: Moderate assistance Toilet Transfer: Minimal assistance Toilet Transfer Details (indicate cue type and reason): MIN A with RW for balance; OT cues for LUE attention and movement of BIL hands for sit to stand and stand to sit. Toileting- Clothing Manipulation and Hygiene: Set up, Moderate assistance (MOD A clothing management from standing at RW; set up for hygiene from sitting using RUE. Pt is right-handed.) Functional mobility during ADLs: Min guard, Rolling walker (2 wheels) (approx 15' 2 attempts in room) General ADL Comments: SUPERVISION toileting with lateral leans for pericare. CGA + MIN cues hand washing, cues for thouroughly washing L hand. MIN A + L HHA for functional mobility ~80 ft.   Cognition: Cognition Overall Cognitive Status: Within Functional Limits for tasks assessed Orientation Level: Oriented X4 Cognition Arousal/Alertness: Awake/alert Behavior During Therapy: WFL for tasks assessed/performed Overall Cognitive Status: Within Functional Limits for tasks assessed General Comments: ? HOH vs cognition deficits. MIN cues for LUE attention   Physical Exam: Blood pressure (!) 141/71, pulse 70, temperature 98.7 F (37.1 C), resp. rate 18, height '5\' 2"'$  (1.575  m), weight 46.3  kg, SpO2 100 %. Physical Exam    Constitutional: No apparent distress. Appropriate appearance for age.  HENT: No JVD. Neck Supple. Trachea midline. Atraumatic, normocephalic. Temporal wasting. +HOH Eyes: PERRLA. EOMI. Visual fields grossly intact. Mild nystagmus on rightward end gaze. Cardiovascular: RRR, no murmurs/rub/gallops. No Edema. Peripheral pulses 2+  Respiratory: CTAB. No rales, rhonchi, or wheezing. On RA.  Abdomen: + bowel sounds, normoactive. No distention or tenderness.  GU: Not examined. Skin: C/D/I. No apparent lesions. MSK:      No apparent deformity.      Strength: 5/5 grossly in RUE and RLE. 4/5 throughout LUE > LLE.    Neurologic exam:  Cognition: AAO to person, place, time and event.  Language: Fluent, No substitutions or neoglisms. Mild dysarthria. Names 3/3 objects correctly.  Memory: Recalls 0/3 objects at 5 minutes. Insight: Good insight into current condition.  Mood: Pleasant affect, appropriate mood.  Sensation: To light touch intact in BL UEs and LEs  Reflexes: 2+ in BL UE and LEs. Negative Hoffman's and babinski signs bilaterally.  CN: Mild L tongue deviation. Otherwise 2-12 intact Coordination: + ataxia on L FTN; intact HTS bilaterally.  Spasticity: MAS 0 in all extremities.        Lab Results Last 48 Hours        Results for orders placed or performed during the hospital encounter of 08/13/22 (from the past 48 hour(s))  Heparin level (unfractionated)     Status: Abnormal    Collection Time: 08/18/22  7:59 PM  Result Value Ref Range    Heparin Unfractionated 0.22 (L) 0.30 - 0.70 IU/mL      Comment: (NOTE) The clinical reportable range upper limit is being lowered to >1.10 to align with the FDA approved guidance for the current laboratory assay.   If heparin results are below expected values, and patient dosage has  been confirmed, suggest follow up testing of antithrombin III levels. Performed at [facility], [address] [place], [place] [address]    CBC     Status: Abnormal    Collection Time: 08/19/22  4:43 AM  Result Value Ref Range    WBC 5.9 4.0 - 10.5 K/uL    RBC 3.02 (L) 3.87 - 5.11 MIL/uL    Hemoglobin 9.8 (L) 12.0 - 15.0 g/dL    HCT 28.9 (L) 36.0 - 46.0 %    MCV 95.7 80.0 - 100.0 fL    MCH 32.5 26.0 - 34.0 pg    MCHC 33.9 30.0 - 36.0 g/dL    RDW 11.8 11.5 - 15.5 %    Platelets 140 (L) 150 - 400 K/uL    nRBC 0.0 0.0 - 0.2 %      Comment: Performed at [facility], [address] [place], [place] [address]  Basic metabolic panel     Status: None    Collection Time: 08/19/22  4:43 AM  Result Value Ref Range    Sodium 138 135 - 145 mmol/L    Potassium 3.6 3.5 - 5.1 mmol/L    Chloride 109 98 - 111 mmol/L    CO2 24 22 - 32 mmol/L    Glucose, Bld 98 70 - 99 mg/dL      Comment: Glucose reference range applies only to samples taken after fasting for at least 8 hours.    BUN 11 8 - 23 mg/dL    Creatinine, Ser 0.76 0.44 - 1.00 mg/dL    Calcium 9.1 8.9 - 10.3 mg/dL  GFR, Estimated >60 >60 mL/min      Comment: (NOTE) Calculated using the CKD-EPI Creatinine Equation (2021)      Anion gap 5 5 - 15      Comment: Performed at [facility], [address] [place], [place] [address]  Heparin level (unfractionated)     Status: None    Collection Time: 08/19/22  4:43 AM  Result Value Ref Range    Heparin Unfractionated 0.35 0.30 - 0.70 IU/mL      Comment: (NOTE) The clinical reportable range upper limit is being lowered to >1.10 to align with the FDA approved guidance for the current laboratory assay.   If heparin results are below expected values, and patient dosage has  been confirmed, suggest follow up testing of antithrombin III levels. Performed at [facility], [place], [place] [address]    Heparin level (unfractionated)     Status: Abnormal    Collection Time: 08/19/22  1:10 PM  Result Value Ref Range    Heparin  Unfractionated 0.22 (L) 0.30 - 0.70 IU/mL      Comment: (NOTE) The clinical reportable range upper limit is being lowered to >1.10 to align with the FDA approved guidance for the current laboratory assay.   If heparin results are below expected values, and patient dosage has  been confirmed, suggest follow up testing of antithrombin III levels. Performed at [facility], [address] [place], [place] [address]    CBC     Status: Abnormal    Collection Time: 08/20/22  4:41 AM  Result Value Ref Range    WBC 5.4 4.0 - 10.5 K/uL    RBC 2.97 (L) 3.87 - 5.11 MIL/uL    Hemoglobin 9.8 (L) 12.0 - 15.0 g/dL    HCT 28.9 (L) 36.0 - 46.0 %    MCV 97.3 80.0 - 100.0 fL    MCH 33.0 26.0 - 34.0 pg    MCHC 33.9 30.0 - 36.0 g/dL    RDW 11.9 11.5 - 15.5 %    Platelets 140 (L) 150 - 400 K/uL    nRBC 0.0 0.0 - 0.2 %      Comment: Performed at [facility], [address] [place], [place] [address]      Imaging Results (Last 48 hours)  No results found.         Blood pressure (!) 141/71, pulse 70, temperature 98.7 F (37.1 C), resp. rate 18, height '5\' 2"'$  (1.575 m), weight 46.3 kg, SpO2 100 %.   Medical Problem List and Plan: 1. Functional deficits secondary to recurrent acute ischemic strokes in multiple vascular territories right hemisphere affecting deep insula and more of the frontal parietal cortical and subcortical brain consistent with progressive right MCA branch vessel infarction.             -patient may shower             -ELOS/Goals: 10-14 days 2.  Antithrombotics:            -DVT/anticoagulation:  Pharmaceutical: Eliquis            -antiplatelet therapy: Aspirin 81 mg daily            - Hypercoagulable work-up negative so far, pending factor V Leyden and prothrombin gene mutation. 3. Pain Management: Neurontin 400 mg twice daily, Topamax 50 mg daily.  Tylenol as needed.  4. Mood/Behavior/Sleep: Trazodone as needed sleep             -  antipsychotic agents:  N/A  5. Neuropsych/cognition: This patient is capable of making decisions on her own behalf.  6. Skin/Wound Care: Routine skin checks 7. Fluids/Electrolytes/Nutrition: Routine in and outs with follow-up chemistries                -Chronic dysphagia; takes pills in applesauce/yogurt. Order placed.   8.  History of uterine/breast cancer.  Follow-up oncology services outpatient.  Patient finished 3 months of chemotherapy as well as recent radiation therapy.  9.  Hyperlipidemia.  Lipitor 10.  Chronic anemia.  Continue iron supplement daily 11.  GERD.  Protonix BID per patient. Prn maalox.  12.  Hypertension.  Norvasc 5 mg daily.  Monitor with increased mobility    08/20/2022    7:30 PM 08/20/2022    5:02 PM 08/20/2022    3:32 PM  Vitals with BMI  Height  '5\' 2"'$    Weight  93 lbs 11 oz   BMI  65.78   Systolic 469 629   Diastolic 79 88   Pulse 84 86 83    13.  Bronchiectasis.  Patient supposed to be on azithromycin for chronic bronchiectasis for 3 months 3 times a week per Dr.Aleskerov note 06/2022.  Azithromycin has been resumed.   Lavon Paganini Angiulli, PA-C 08/20/2022   I have examined the patient independently and edited the note for HPI, ROS, exam, assessment, and plan as appropriate. I am in agreement with the above recommendations.   Gertie Gowda, DO 08/20/2022

## 2022-08-21 ENCOUNTER — Inpatient Hospital Stay (HOSPITAL_COMMUNITY): Payer: PPO

## 2022-08-21 DIAGNOSIS — I63511 Cerebral infarction due to unspecified occlusion or stenosis of right middle cerebral artery: Secondary | ICD-10-CM | POA: Diagnosis not present

## 2022-08-21 LAB — CBC WITH DIFFERENTIAL/PLATELET
Abs Immature Granulocytes: 0.01 10*3/uL (ref 0.00–0.07)
Basophils Absolute: 0 10*3/uL (ref 0.0–0.1)
Basophils Relative: 1 %
Eosinophils Absolute: 0.1 10*3/uL (ref 0.0–0.5)
Eosinophils Relative: 1 %
HCT: 32 % — ABNORMAL LOW (ref 36.0–46.0)
Hemoglobin: 10.9 g/dL — ABNORMAL LOW (ref 12.0–15.0)
Immature Granulocytes: 0 %
Lymphocytes Relative: 18 %
Lymphs Abs: 1 10*3/uL (ref 0.7–4.0)
MCH: 33.4 pg (ref 26.0–34.0)
MCHC: 34.1 g/dL (ref 30.0–36.0)
MCV: 98.2 fL (ref 80.0–100.0)
Monocytes Absolute: 0.5 10*3/uL (ref 0.1–1.0)
Monocytes Relative: 9 %
Neutro Abs: 4 10*3/uL (ref 1.7–7.7)
Neutrophils Relative %: 71 %
Platelets: 157 10*3/uL (ref 150–400)
RBC: 3.26 MIL/uL — ABNORMAL LOW (ref 3.87–5.11)
RDW: 11.9 % (ref 11.5–15.5)
WBC: 5.6 10*3/uL (ref 4.0–10.5)
nRBC: 0 % (ref 0.0–0.2)

## 2022-08-21 LAB — COMPREHENSIVE METABOLIC PANEL
ALT: 18 U/L (ref 0–44)
AST: 19 U/L (ref 15–41)
Albumin: 3.6 g/dL (ref 3.5–5.0)
Alkaline Phosphatase: 79 U/L (ref 38–126)
Anion gap: 8 (ref 5–15)
BUN: 12 mg/dL (ref 8–23)
CO2: 23 mmol/L (ref 22–32)
Calcium: 9.7 mg/dL (ref 8.9–10.3)
Chloride: 107 mmol/L (ref 98–111)
Creatinine, Ser: 1.03 mg/dL — ABNORMAL HIGH (ref 0.44–1.00)
GFR, Estimated: 54 mL/min — ABNORMAL LOW (ref >60)
Glucose, Bld: 129 mg/dL — ABNORMAL HIGH (ref 70–99)
Potassium: 3.4 mmol/L — ABNORMAL LOW (ref 3.5–5.1)
Sodium: 138 mmol/L (ref 135–145)
Total Bilirubin: 0.4 mg/dL (ref 0.3–1.2)
Total Protein: 6.2 g/dL — ABNORMAL LOW (ref 6.5–8.1)

## 2022-08-21 LAB — PROTHROMBIN GENE MUTATION

## 2022-08-21 NOTE — Progress Notes (Signed)
Patient reportedly became weak/unresponsive after bowel movement suspect vasovagal response.  No chest pain or shortness of breath reported.  Her speech does appear to be a bit more slurred.  She is following simple commands.  Will check CT scan of head

## 2022-08-21 NOTE — Plan of Care (Signed)
Problem: RH Furniture Transfers Goal: LTG Patient will perform furniture transfers w/assist (OT/PT) Description: LTG: Patient will perform furniture transfers  with assistance (OT/PT). Flowsheets (Taken 08/21/2022 1418 by Sherilyn Dacosta, Christian P, PT) LTG: Pt will perform furniture transfers with assist:: Supervision/Verbal cueing   Problem: RH Balance Goal: LTG: Patient will maintain dynamic sitting balance (OT) Description: LTG:  Patient will maintain dynamic sitting balance with assistance during activities of daily living (OT) Flowsheets (Taken 08/21/2022 1618) LTG: Pt will maintain dynamic sitting balance during ADLs with: Supervision/Verbal cueing Goal: LTG Patient will maintain dynamic standing with ADLs (OT) Description: LTG:  Patient will maintain dynamic standing balance with assist during activities of daily living (OT)  Flowsheets (Taken 08/21/2022 1618) LTG: Pt will maintain dynamic standing balance during ADLs with: Supervision/Verbal cueing   Problem: Sit to Stand Goal: LTG:  Patient will perform sit to stand in prep for activites of daily living with assistance level (OT) Description: LTG:  Patient will perform sit to stand in prep for activites of daily living with assistance level (OT) Flowsheets (Taken 08/21/2022 1618) LTG: PT will perform sit to stand in prep for activites of daily living with assistance level: Supervision/Verbal cueing   Problem: RH Grooming Goal: LTG Patient will perform grooming w/assist,cues/equip (OT) Description: LTG: Patient will perform grooming with assist, with/without cues using equipment (OT) Flowsheets (Taken 08/21/2022 1618) LTG: Pt will perform grooming with assistance level of: Supervision/Verbal cueing   Problem: RH Bathing Goal: LTG Patient will bathe all body parts with assist levels (OT) Description: LTG: Patient will bathe all body parts with assist levels (OT) Flowsheets (Taken 08/21/2022 1618) LTG: Pt will perform bathing with  assistance level/cueing: Supervision/Verbal cueing LTG: Position pt will perform bathing: Shower   Problem: RH Dressing Goal: LTG Patient will perform upper body dressing (OT) Description: LTG Patient will perform upper body dressing with assist, with/without cues (OT). Flowsheets (Taken 08/21/2022 1618) LTG: Pt will perform upper body dressing with assistance level of: Supervision/Verbal cueing Goal: LTG Patient will perform lower body dressing w/assist (OT) Description: LTG: Patient will perform lower body dressing with assist, with/without cues in positioning using equipment (OT) Flowsheets (Taken 08/21/2022 1618) LTG: Pt will perform lower body dressing with assistance level of: Supervision/Verbal cueing   Problem: RH Toileting Goal: LTG Patient will perform toileting task (3/3 steps) with assistance level (OT) Description: LTG: Patient will perform toileting task (3/3 steps) with assistance level (OT)  Flowsheets (Taken 08/21/2022 1618) LTG: Pt will perform toileting task (3/3 steps) with assistance level: Supervision/Verbal cueing   Problem: RH Functional Use of Upper Extremity Goal: LTG Patient will use RT/LT upper extremity as a (OT) Description: LTG: Patient will use right/left upper extremity as a stabilizer/gross assist/diminished/nondominant/dominant level with assist, with/without cues during functional activity (OT) Flowsheets (Taken 08/21/2022 1618) LTG: Use of upper extremity in functional activities: LUE as gross assist level LTG: Pt will use upper extremity in functional activity with assistance level of: Supervision/Verbal cueing   Problem: RH Laundry Goal: LTG Patient will perform laundry w/assist, cues (OT) Description: LTG: Patient will perform laundry with assistance, with/without cues (OT). Flowsheets (Taken 08/21/2022 1618) LTG: Pt will perform laundry with assistance level of: Contact Guard/Touching assist LTG: Pt will perform laundry with level of: Ambulate  with device   Problem: RH Awareness Goal: LTG: Patient will demonstrate awareness during functional activites type of (OT) Description: LTG: Patient will demonstrate awareness during functional activites type of (OT) Flowsheets (Taken 08/21/2022 1618) Patient will demonstrate awareness during functional activites type of:  Anticipatory LTG: Patient will demonstrate awareness during functional activites type of (OT): Supervision

## 2022-08-21 NOTE — Evaluation (Signed)
Occupational Therapy Assessment and Plan  Patient Details  Name: Elizabeth Friedman MRN: [id number] Date of Birth: Mar 21, 1941  OT Diagnosis: apraxia, ataxia, hemiplegia affecting non-dominant side, and muscle weakness (generalized) Rehab Potential: Rehab Potential (ACUTE ONLY): Good ELOS: 10-14 days   Today's Date: 08/21/2022 OT Individual Time: 1194-1740 OT Individual Time Calculation (min): 22 min     Hospital Problem: Principal Problem:   Acute ischemic right MCA stroke ([place])   Past Medical History:  Past Medical History:  Diagnosis Date   Allergic rhinitis    Anemia    Anginal pain ([place])    Arthritis    Atypical chest pain    Breast cancer ([place])    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[place])    Ductal carcinoma in situ (DCIS) of right breast    Fibrocystic breast disease    GERD (gastroesophageal reflux disease)    Hearing aid worn    bilateral   History of hiatal hernia    Hyperlipidemia    Lung nodule    Menopause    Migraine    Migraine    optical migraines   Motion sickness    Plantar fasciitis    Prolapse of female pelvic organs    hodge pessary   Raynaud's disease    Sciatic leg pain    Sciatica    Skin cancer of face    Stroke [facility])    Urinary retention with incomplete bladder emptying    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   Vitreous detachment of both eyes    Past Surgical History:  Past Surgical History:  Procedure Laterality Date   APPENDECTOMY  1951   BREAST LUMPECTOMY WITH RADIOACTIVE SEED LOCALIZATION Right 05/29/2020   Procedure: RIGHT BREAST LUMPECTOMY WITH RADIOACTIVE SEED LOCALIZATION;  Surgeon: Rolm Bookbinder, MD;  Location: [place];  Service: General;  Laterality: Right;   CARDIAC CATHETERIZATION  2004   negative   CATARACT EXTRACTION W/PHACO Right 02/26/2021   Procedure: CATARACT EXTRACTION PHACO AND INTRAOCULAR LENS PLACEMENT (IOC) RIGHT 5.86 01:19.6 7.4%;  Surgeon: Leandrew Koyanagi, MD;  Location: [place];  Service:  Ophthalmology;  Laterality: Right;   CATARACT EXTRACTION W/PHACO Left 09/03/2021   Procedure: CATARACT EXTRACTION PHACO AND INTRAOCULAR LENS PLACEMENT ([place]) LEFT;  Surgeon: Leandrew Koyanagi, MD;  Location: [place];  Service: Ophthalmology;  Laterality: Left;  9.61 01:15.9   COLONOSCOPY     2000, 2006, 2012   COLONOSCOPY WITH PROPOFOL N/A 03/27/2016   Procedure: COLONOSCOPY WITH PROPOFOL;  Surgeon: Manya Silvas, MD;  [facility] ENDOSCOPY;  Service: Endoscopy;  Laterality: N/A;   COLONOSCOPY WITH PROPOFOL N/A 08/14/2020   Procedure: COLONOSCOPY WITH PROPOFOL;  Surgeon: Toledo, Benay Pike, MD;  Location: [facility] ENDOSCOPY;  Service: Gastroenterology;  Laterality: N/A;   CYSTOSCOPY N/A 12/24/2021   Procedure: CYSTOSCOPY;  Surgeon: Jaquita Folds, MD;  Location: [facility] ORS;  Service: Gynecology;  Laterality: N/A;   DILATATION & CURETTAGE/HYSTEROSCOPY WITH MYOSURE N/A 12/01/2021   Procedure: DILATATION & CURETTAGE/HYSTEROSCOPY WITH MYOSURE;  Surgeon: Rubie Maid, MD;  Location: [facility] ORS;  Service: Gynecology;  Laterality: N/A;   ESOPHAGOGASTRODUODENOSCOPY  08/1999   ESOPHAGOGASTRODUODENOSCOPY (EGD) WITH PROPOFOL N/A 08/14/2020   Procedure: ESOPHAGOGASTRODUODENOSCOPY (EGD) WITH PROPOFOL;  Surgeon: Toledo, Benay Pike, MD;  Location: [facility] ENDOSCOPY;  Service: Gastroenterology;  Laterality: N/A;   ESOPHAGOGASTRODUODENOSCOPY (EGD) WITH PROPOFOL N/A 03/21/2021   Procedure: ESOPHAGOGASTRODUODENOSCOPY (EGD) WITH PROPOFOL;  Surgeon: Toledo, Benay Pike, MD;  Location: [facility] ENDOSCOPY;  Service: Gastroenterology;  Laterality: N/A;   PORTA CATH INSERTION N/A 01/15/2022  Procedure: PORTA CATH INSERTION;  Surgeon: Algernon Huxley, MD;  Location: [place] CV LAB;  Service: Cardiovascular;  Laterality: N/A;   PORTA CATH REMOVAL N/A 06/15/2022   Procedure: PORTA CATH REMOVAL;  Surgeon: Algernon Huxley, MD;  Location: [place] CV LAB;  Service: Cardiovascular;  Laterality: N/A;   SKIN CANCER  EXCISION     face and neck   TEE WITHOUT CARDIOVERSION N/A 08/17/2022   Procedure: TRANSESOPHAGEAL ECHOCARDIOGRAM (TEE);  Surgeon: Dionisio David, MD;  Location: [facility] ORS;  Service: Cardiovascular;  Laterality: N/A;   TOTAL LAPAROSCOPIC HYSTERECTOMY WITH BILATERAL SALPINGO OOPHORECTOMY Bilateral 12/24/2021   Procedure: TOTAL LAPAROSCOPIC HYSTERECTOMY WITH BILATERAL SALPINGO OOPHORECTOMY, PELVIC NODE BIOPSY,  PESSARY REMOVAL (PESSARY REMOVAL BY DR. Wannetta Sender;  Surgeon: Mellody Drown, MD;  Location: [facility] ORS;  Service: Gynecology;  Laterality: Bilateral;    Assessment & Plan Clinical Impression: Patient is a 81 y.o. 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  She also finished adjuvant radiation approximately 1 month ago.  Per chart review patient lives alone.  1 level home 3 steps to entry.  Ambulates with a rollator since latest admission.  She does have good family support.  Presented to [facility] 08/13/2022 with increasing dizziness, left-sided weakness and loss of balance and slurred speech.  Most recent echocardiogram ejection fraction of 60 to 65% no wall motion abnormalities.  CT/MRI follow-up showed interval extension of infarction on the right hemisphere, now affecting the deep insula and more the frontal parietal cortical and subcortical brain consistent with progressive right MCA branch vessel infarction.  No evidence of hemorrhage or mass effect.  Newly seen subcentimeter focus of acute infarct in the left frontal white matter.  Underlying chronic microvascular ischemic disease with a few small remote lacunar infarcts.  MRI cervical spine with no cord insult.  CT angiogram of the chest showed no segmental or pulmonary emboli.  Lower extremity  Dopplers negative for DVT.  Admission chemistries unremarkable except sodium 131, troponin 89 D-dimer elevated 10.67.  Follow-up TEE negative.  Follow-up neurology services in light of recurrent CVA concern for hypercoagulopathy in the setting of malignancy was placed on low-grade heparin and transitioned to Eliquis and would also remain on low. Patient transferred to CIR on 08/20/2022 .    Patient currently requires min to mod with basic self-care skills and IADL secondary to muscle weakness and muscle paralysis, decreased cardiorespiratoy endurance, motor apraxia, ataxia, decreased coordination, and decreased motor planning, decreased attention to left, and left side neglect.  Prior to hospitalization, patient could complete ADLs and IADLs with modified independent .  Patient will benefit from skilled intervention to decrease level of assist with basic self-care skills, increase independence with basic self-care skills, and increase level of independence with iADL prior to discharge  to an ALF .  Anticipate patient will require 24 hour supervision at home or at ALF and follow up home health.   OT - End of Session Activity Tolerance: Tolerates 30+ min activity with multiple rests OT Assessment Rehab Potential (ACUTE ONLY): Good OT Patient demonstrates impairments in the following area(s): Safety;Perception;Balance;Sensory;Motor;Cognition OT Basic ADL's Functional Problem(s): Grooming;Bathing;Toileting;Dressing OT Advanced ADL's Functional Problem(s): Laundry;Simple Meal Preparation;Light Housekeeping OT Transfers Functional Problem(s): Tub/Shower;Toilet OT Additional Impairment(s): Fuctional Use of Upper Extremity OT Plan OT Intensity:  Minimum of 1-2 x/day, 45 to 90 minutes OT Frequency: 5 out of 7 days OT Duration/Estimated Length of Stay: 10-14 days OT Treatment/Interventions: Medical illustrator training;Community reintegration;Functional electrical stimulation;Neuromuscular  re-education;Patient/family education;Self Care/advanced ADL retraining;Therapeutic Exercise;UE/LE Coordination activities;Wheelchair propulsion/positioning;Cognitive remediation/compensation;Discharge planning;DME/adaptive equipment instruction;Functional mobility training;Psychosocial support;Skin care/wound managment;Therapeutic Activities;UE/LE Strength taining/ROM;Visual/perceptual remediation/compensation OT Self Feeding Anticipated Outcome(s): Independent OT Basic Self-Care Anticipated Outcome(s): Modified Independent OT Toileting Anticipated Outcome(s): Modified Independent OT Bathroom Transfers Anticipated Outcome(s): Modified Independnet OT Recommendation Recommendations for Other Services: Therapeutic Recreation consult Therapeutic Recreation Interventions: Pet therapy;Outing/community reintergration;Kitchen group Patient destination: Home (Pt open to goingt to ALF or SNF if requiring assistance at dc. Need to discuss availability for family support with family members.) Follow Up Recommendations: Skilled nursing facility;24 hour supervision/assistance (Pt wanting to go to ALF d/t fmaily being out of town during [employer]) Equipment Recommended: To be determined   OT Evaluation Precautions/Restrictions  Precautions Precautions: Fall Precaution Comments: L hemi (UE > LE), L inattention Restrictions Weight Bearing Restrictions: No General Chart Reviewed: Yes Additional Pertinent History: Hx of cancer Family/Caregiver Present: No Vital Signs  Pain Pain Assessment Pain Scale: 0-10 Pain Score: 0-No pain Home Living/Prior Functioning Home Living Family/patient expects to be discharged to:: Private residence Living Arrangements: Alone Available Help at Discharge: Family, Available 24 hours/day Type of Home: House Home Access: Stairs to enter [employer] of Steps: 2-3 steps on the side with pull bar Entrance Stairs-Rails: Right Home Layout: One level Bathroom  Shower/Tub: Tub/shower unit, Multimedia programmer: Standard Bathroom Accessibility: Yes Additional Comments: Son lives ~20 min away, daughter ~30  Lives With: Alone IADL History Homemaking Responsibilities: Yes Meal Prep Responsibility: Therapist, occupational Responsibility: Primary Cleaning Responsibility: Primary Bill Paying/Finance Responsibility: Primary Shopping Responsibility: Primary Child Care Responsibility: No Current License: Yes Mode of Transportation: Car Occupation: Retired Prior Function Level of Independence: Independent with gait, Independent with transfers, Independent with homemaking with ambulation  Able to Take Stairs?: Yes Driving: Yes Vocation: Retired Surveyor, mining Baseline Vision/History: 1 Wears glasses Ability to See in Adequate Light: 1 Impaired Patient Visual Report: No change from baseline Vision Assessment?: Yes Eye Alignment: Within Functional Limits Ocular Range of Motion: Within Functional Limits Alignment/Gaze Preference: Within Defined Limits Tracking/Visual Pursuits: Decreased smoothness of horizontal tracking;Decreased smoothness of vertical tracking;Impaired - to be further tested in functional context Saccades: Within functional limits Convergence: Impaired (comment) Perception  Perception: Impaired Inattention/Neglect: Does not attend to left visual field;Does not attend to left side of body Praxis Praxis: Impaired Praxis Impairment Details: Motor planning Cognition Cognition Overall Cognitive Status: Within Functional Limits for tasks assessed Arousal/Alertness: Awake/alert Memory: Appears intact Attention: Selective;Sustained;Focused Focused Attention: Appears intact Sustained Attention: Appears intact Selective Attention: Impaired Selective Attention Impairment: Verbal basic;Functional basic Awareness: Impaired Awareness Impairment: Anticipatory impairment Problem Solving: Impaired Problem Solving Impairment: Verbal  basic;Functional basic Safety/Judgment: Impaired Sensation Sensation Light Touch: Impaired by gross assessment Hot/Cold: Not tested Proprioception: Impaired by gross assessment Stereognosis: Impaired by gross assessment Coordination Gross Motor Movements are Fluid and Coordinated: No Fine Motor Movements are Fluid and Coordinated: No Coordination and Movement Description: Uncoordinated movements of LUE GM and FM tested with sequential finger tapping test for thumb opposition Finger Nose Finger Test: Dysmetria in LUE, undershooting when reaching for figner Motor  Motor Motor: Hemiplegia;Ataxia;Motor perseverations Motor - Skilled Clinical Observations: Uncoordinated LUE movements  Trunk/Postural Assessment  Cervical Assessment Cervical Assessment: Exceptions to WFL (Forward head lean) Thoracic Assessment Thoracic Assessment: Within Functional Limits Lumbar Assessment Lumbar Assessment: Within Functional Limits Postural Control Postural Control: Deficits on  evaluation Trunk Control: Mild decreased trunk control on L side Righting Reactions: Decreased on L Protective Responses: Decreased on L  Balance Balance Balance Assessed: Yes Extremity/Trunk Assessment RUE Assessment RUE Assessment: Within Functional Limits LUE Assessment LUE Assessment: Exceptions to [facility] General Strength Comments: Decreased hand strength, shoulder 3-/5, bicep 3/5, tricep 3/5, ER/IR 3/5 with increased time for motor plannin LUE Body System: Neuro LUE AROM (degrees) Overall AROM Left Upper Extremity: Deficits Left Composite Finger Extension: 50% Left Composite Finger Flexion: 50% Left Thumb Opposition: Digit 2 LUE Strength LUE Overall Strength: Deficits  Care Tool Care Tool Self Care Eating        Oral Care    Oral Care Assist Level: Moderate Assistance - Patient 50 - 74%    Bathing   Body parts bathed by patient: Right arm;Abdomen;Left arm;Chest;Front perineal area;Buttocks;Right upper  leg;Left upper leg;Right lower leg;Face;Left lower leg     Assist Level: Minimal Assistance - Patient > 75% (Verbal cueing for attention & min a to maintain dynamic sitting/standing balance)    Upper Body Dressing(including orthotics)   What is the patient wearing?: Pull over shirt;Bra   Assist Level: Moderate Assistance - Patient 50 - 74%    Lower Body Dressing (excluding footwear)   What is the patient wearing?: Pants;Underwear/pull up Assist for lower body dressing: Moderate Assistance - Patient 50 - 74%    Putting on/Taking off footwear   What is the patient wearing?: Socks;Shoes Assist for footwear: Moderate Assistance - Patient 50 - 74%       Care Tool Toileting Toileting activity   Assist for toileting: Moderate Assistance - Patient 50 - 74%     Care Tool Bed Mobility Roll left and right activity        Sit to lying activity        Lying to sitting on side of bed activity         Care Tool Transfers Sit to stand transfer        Chair/bed transfer         Toilet transfer   Assist Level: Minimal Assistance - Patient > 75%     Care Tool Cognition  Expression of Ideas and Wants Expression of Ideas and Wants: 3. Some difficulty - exhibits some difficulty with expressing needs and ideas (e.g, some words or finishing thoughts) or speech is not clear  Understanding Verbal and Non-Verbal Content Understanding Verbal and Non-Verbal Content: 3. Usually understands - understands most conversations, but misses some part/intent of message. Requires cues at times to understand   Memory/Recall Ability Memory/Recall Ability : That he or she is in a hospital/hospital unit   Refer to Care Plan for Long Term Goals  SHORT TERM GOAL WEEK 1 OT Short Term Goal 1 (Week 1): Pt will perform LB dressing with CGA OT Short Term Goal 2 (Week 1): Pt will perform toilet transfer wiht CGA OT Short Term Goal 3 (Week 1): Pt will use of LUE during ADLS 10% of time with min A OT Short  Term Goal 4 (Week 1): Pt will perform grooming tasks sitting/standing at sink AD PRN with CGA  Recommendations for other services: Therapeutic Recreation  Pet therapy, Kitchen group, and Outing/community reintegration   Skilled Therapeutic Intervention Pt received completing toielting with nursing staff present in room in good spirits and motivated to participate in therapy session. Pt completed toileting with mod A for clothing management and balance. Pt perseverating on wiping front peri area requiring moderate verbal cues to continue to  next step of task. Pt ambulated to the sink with min A using RW with moderate verbal cues for attention to left side and to prevent running into objects. Pt ambulated back to the bathroom with her RW and transferred to tub bench with Min A using RW. Pt doffed clothing with min A and completed U/lB bathing with min A to maintain dynamic sitting balance and verbal cueing to continue to next step of task. Pt completed U/LB dressing sitting EOB with education on hemi-dressing technique providing requiring mod A for problem solving. Pt able to begin donning socks, but required assistance to finish task. Pt ambulated with min HHA to wc and was left restin in her wheel chair with seat belt alarm on, call bell in reach, and all needs met. ADL supplies presented on Pt's left side throughout session to increase awareness to L side. Pt demonstrating L sided neglect and decreased insight to deficits.   ADL ADL Grooming: Moderate cueing;Moderate assistance Where Assessed-Grooming: Sitting at sink Upper Body Bathing: Moderate cueing;Minimal assistance Where Assessed-Upper Body Bathing: Shower Lower Body Bathing: Moderate cueing;Minimal assistance Where Assessed-Lower Body Bathing: Shower Upper Body Dressing: Moderate cueing;Moderate assistance Where Assessed-Upper Body Dressing: Edge of bed Lower Body Dressing: Moderate assistance;Moderate cueing Where Assessed-Lower Body  Dressing: Edge of bed Toileting: Moderate assistance Where Assessed-Toileting: Glass blower/designer: Psychiatric nurse Method: Counselling psychologist: Energy manager: Not assessed Social research officer, government: Moderate cueing;Minimal assistance Social research officer, government Method: Print production planner without back;Grab bars Mobility  Bed Mobility Bed Mobility: Rolling Right;Rolling Left Rolling Right: Minimal Assistance - Patient > 75% Rolling Left: Minimal Assistance - Patient > 75% Transfers Sit to Stand: Minimal Assistance - Patient > 75% Stand to Sit: Minimal Assistance - Patient > 75%   Discharge Criteria: Patient will be discharged from OT if patient refuses treatment 3 consecutive times without medical reason, if treatment goals not met, if there is a change in medical status, if patient makes no progress towards goals or if patient is discharged from hospital.  The above assessment, treatment plan, treatment alternatives and goals were discussed and mutually agreed upon: by patient  Janey Genta 08/21/2022, 1:21 PM

## 2022-08-21 NOTE — Progress Notes (Signed)
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[place])    Ductal carcinoma in situ (DCIS) of right breast    Fibrocystic breast disease    GERD (gastroesophageal reflux disease)    Hearing aid worn    bilateral   History of hiatal hernia    Hyperlipidemia    Lung nodule    Menopause    Migraine    Migraine    optical migraines   Motion sickness    Plantar fasciitis    Prolapse of female pelvic organs    hodge pessary   Raynaud's disease    Sciatic leg pain    Sciatica    Skin cancer of face    Stroke [facility])    Urinary retention with incomplete bladder emptying    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   Vitreous detachment of both eyes    Past Surgical History:  Past Surgical History:  Procedure Laterality Date   APPENDECTOMY  1951   BREAST LUMPECTOMY WITH RADIOACTIVE SEED LOCALIZATION Right 05/29/2020   Procedure: RIGHT BREAST LUMPECTOMY WITH RADIOACTIVE SEED LOCALIZATION;  Surgeon: Rolm Bookbinder, MD;  Location: [place];  Service: General;  Laterality: Right;   CARDIAC CATHETERIZATION  2004   negative   CATARACT EXTRACTION W/PHACO Right 02/26/2021   Procedure: CATARACT EXTRACTION PHACO AND INTRAOCULAR LENS PLACEMENT (IOC) RIGHT 5.86 01:19.6 7.4%;  Surgeon: Leandrew Koyanagi, MD;  Location: [place];  Service: Ophthalmology;  Laterality: Right;   CATARACT EXTRACTION W/PHACO Left 09/03/2021   Procedure: CATARACT EXTRACTION PHACO AND INTRAOCULAR LENS PLACEMENT ([place]) LEFT;  Surgeon: Leandrew Koyanagi, MD;  Location: [place];  Service:  Ophthalmology;  Laterality: Left;  9.61 01:15.9   COLONOSCOPY     2000, 2006, 2012   COLONOSCOPY WITH PROPOFOL N/A 03/27/2016   Procedure: COLONOSCOPY WITH PROPOFOL;  Surgeon: Manya Silvas, MD;  Location: [facility] ENDOSCOPY;  Service: Endoscopy;  Laterality: N/A;   COLONOSCOPY WITH PROPOFOL N/A 08/14/2020   Procedure: COLONOSCOPY WITH PROPOFOL;  Surgeon: Toledo, Benay Pike, MD;  Location: [facility] ENDOSCOPY;  Service: Gastroenterology;  Laterality: N/A;   CYSTOSCOPY N/A 12/24/2021   Procedure: CYSTOSCOPY;  Surgeon: Jaquita Folds, MD;  Location: [facility] ORS;  Service: Gynecology;  Laterality: N/A;   DILATATION & CURETTAGE/HYSTEROSCOPY WITH MYOSURE N/A 12/01/2021   Procedure: DILATATION & CURETTAGE/HYSTEROSCOPY WITH MYOSURE;  Surgeon: Rubie Maid, MD;  Location: [facility] ORS;  Service: Gynecology;  Laterality: N/A;   ESOPHAGOGASTRODUODENOSCOPY  08/1999   ESOPHAGOGASTRODUODENOSCOPY (EGD) WITH PROPOFOL N/A 08/14/2020   Procedure: ESOPHAGOGASTRODUODENOSCOPY (EGD) WITH PROPOFOL;  Surgeon: Toledo, Benay Pike, MD;  Location: [facility] ENDOSCOPY;  Service: Gastroenterology;  Laterality: N/A;   ESOPHAGOGASTRODUODENOSCOPY (EGD) WITH PROPOFOL N/A 03/21/2021   Procedure: ESOPHAGOGASTRODUODENOSCOPY (EGD) WITH PROPOFOL;  Surgeon: Toledo, Benay Pike, MD;  Location: [facility] ENDOSCOPY;  Service: Gastroenterology;  Laterality: N/A;   PORTA CATH INSERTION N/A 01/15/2022   Procedure: PORTA CATH INSERTION;  Surgeon: Algernon Huxley, MD;  Location: [place] CV LAB;  Service: Cardiovascular;  Laterality: N/A;   PORTA CATH REMOVAL N/A 06/15/2022   Procedure: PORTA CATH REMOVAL;  Surgeon:  Algernon Huxley, MD;  Location: [place] CV LAB;  Service: Cardiovascular;  Laterality: N/A;   SKIN CANCER EXCISION     face and neck   TEE WITHOUT CARDIOVERSION N/A 08/17/2022   Procedure: TRANSESOPHAGEAL ECHOCARDIOGRAM (TEE);  Surgeon: Dionisio David, MD;  Location: [facility] ORS;  Service: Cardiovascular;  Laterality: N/A;   TOTAL LAPAROSCOPIC  HYSTERECTOMY WITH BILATERAL SALPINGO OOPHORECTOMY Bilateral 12/24/2021   Procedure: TOTAL LAPAROSCOPIC HYSTERECTOMY WITH BILATERAL SALPINGO OOPHORECTOMY, PELVIC NODE BIOPSY,  PESSARY REMOVAL (PESSARY REMOVAL BY DR. Wannetta Sender;  Surgeon: Mellody Drown, MD;  Location: [facility] ORS;  Service: Gynecology;  Laterality: Bilateral;   Social History:  reports that she has never smoked. She has never used smokeless tobacco. She reports that she does not drink alcohol and does not use drugs.  Family / Support Systems Children: Elizabeth Friedman (Son), Elizabeth Friedman (Daughter) Anticipated Caregiver: steven Ability/Limitations of Caregiver: Son getting married 11/11. Patient will requires [facility] or ALF  Social History Preferred language: English Religion: [employer] - How often do you need to have someone help you when you read instructions, pamphlets, or other written material from your doctor or pharmacy?: Never Writes: Yes   Abuse/Neglect Abuse/Neglect Assessment Can Be Completed: Yes Physical Abuse: Denies Verbal Abuse: Denies Sexual Abuse: Denies Exploitation of patient/patient's resources: Denies Self-Neglect: Denies  Patient response to: Social Isolation - How often do you feel lonely or isolated from those around you?: Never  Emotional Status Recent Psychosocial Issues: coping Psychiatric History: n/a Substance Abuse History: n/a  Patient / Family Perceptions, Expectations & Goals Pt/Family understanding of illness & functional limitations: yes Premorbid pt/family roles/activities: Previously required assistance with ADLS and independent with mobility and cognition Anticipated changes in roles/activities/participation: Aniticpates HC or ALF assistance Pt/family expectations/goals: MOD I  Community Resources Premorbid Home Care/DME Agencies: Other (Comment) Educational psychologist) Transportation available at discharge: Family potenially will be out of town, will require  transportation Is the patient able to respond to transportation needs?: Yes In the past 12 months, has lack of transportation kept you from medical appointments or from getting medications?: No In the past 12 months, has lack of transportation kept you from meetings, work, or from getting things needed for daily living?: No Resource referrals recommended: Neuropsychology  Discharge Planning Living Arrangements: Alone Support Systems: Children Type of Residence: Private residence Insurance Resources: Multimedia programmer (specify) (HTA) Financial Resources: Family Support Financial Screen Referred: No Money Management: Patient Does the patient have any problems obtaining your medications?: No Home Management: Independent Patient/Family Preliminary Plans: Anticipates to continue to manage Care Coordinator Barriers to Discharge: Insurance for SNF coverage, Decreased caregiver support, Lack of/limited family support, Inaccessible home environment Care Coordinator Anticipated Follow Up Needs: HH/OP Expected length of stay: 12-14 Days  Clinical Impression SW met with patient and daughter, introduced self and explained role. Patient anticipates discharging home with [facility] or to ALF. Sw provided patient and daughter with resources to review. No additional questions or concerns, sw will continue to follow up.   Dyanne Iha 08/21/2022, 12:23 PM

## 2022-08-21 NOTE — Evaluation (Addendum)
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[place])    Ductal carcinoma in situ (DCIS) of right breast    Fibrocystic breast disease    GERD (gastroesophageal reflux disease)    Hearing aid worn    bilateral   History of hiatal hernia    Hyperlipidemia    Lung nodule    Menopause    Migraine    Migraine    optical migraines   Motion sickness    Plantar fasciitis    Prolapse of female pelvic organs    hodge pessary   Raynaud's disease    Sciatic leg pain    Sciatica    Skin cancer of face    Stroke [facility])    Urinary retention with incomplete bladder emptying    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   Vitreous detachment of both eyes    Past Surgical History:  Past Surgical History:  Procedure Laterality Date   APPENDECTOMY  1951   BREAST LUMPECTOMY WITH RADIOACTIVE SEED LOCALIZATION Right 05/29/2020   Procedure: RIGHT BREAST LUMPECTOMY WITH RADIOACTIVE SEED LOCALIZATION;  Surgeon: Rolm Bookbinder, MD;  Location: [place];  Service: General;  Laterality: Right;   CARDIAC CATHETERIZATION  2004   negative   CATARACT EXTRACTION W/PHACO Right 02/26/2021   Procedure: CATARACT EXTRACTION PHACO AND INTRAOCULAR LENS PLACEMENT (IOC) RIGHT 5.86 01:19.6 7.4%;  Surgeon: Leandrew Koyanagi, MD;  Location: [place];  Service: Ophthalmology;  Laterality: Right;   CATARACT EXTRACTION W/PHACO Left 09/03/2021   Procedure:  CATARACT EXTRACTION PHACO AND INTRAOCULAR LENS PLACEMENT ([place]) LEFT;  Surgeon: Leandrew Koyanagi, MD;  Location: [place];  Service: Ophthalmology;  Laterality: Left;  9.61 01:15.9   COLONOSCOPY     2000, 2006, 2012   COLONOSCOPY WITH PROPOFOL N/A 03/27/2016   Procedure: COLONOSCOPY WITH PROPOFOL;  Surgeon: Manya Silvas, MD;  Location: [facility] ENDOSCOPY;  Service: Endoscopy;  Laterality: N/A;   COLONOSCOPY WITH PROPOFOL N/A 08/14/2020   Procedure: COLONOSCOPY WITH PROPOFOL;  Surgeon: Toledo, Benay Pike, MD;  Location: [facility] ENDOSCOPY;  Service: Gastroenterology;  Laterality: N/A;   CYSTOSCOPY N/A 12/24/2021   Procedure: CYSTOSCOPY;  Surgeon: Jaquita Folds, MD;  Location: [facility] ORS;  Service: Gynecology;  Laterality: N/A;   DILATATION & CURETTAGE/HYSTEROSCOPY WITH MYOSURE N/A 12/01/2021   Procedure: DILATATION & CURETTAGE/HYSTEROSCOPY WITH MYOSURE;  Surgeon: Rubie Maid, MD;  Location: [facility] ORS;  Service: Gynecology;  Laterality: N/A;   ESOPHAGOGASTRODUODENOSCOPY  08/1999   ESOPHAGOGASTRODUODENOSCOPY (EGD) WITH PROPOFOL N/A 08/14/2020   Procedure: ESOPHAGOGASTRODUODENOSCOPY (EGD) WITH PROPOFOL;  Surgeon: Toledo, Benay Pike, MD;  Location: [facility] ENDOSCOPY;  Service: Gastroenterology;  Laterality: N/A;   ESOPHAGOGASTRODUODENOSCOPY (EGD) WITH PROPOFOL N/A 03/21/2021   Procedure: ESOPHAGOGASTRODUODENOSCOPY (EGD) WITH PROPOFOL;  Surgeon: Toledo, Benay Pike, MD;  Location: [facility] ENDOSCOPY;  Service: Gastroenterology;  Laterality: N/A;   PORTA CATH INSERTION N/A 01/15/2022   Procedure: PORTA CATH INSERTION;  Surgeon: Algernon Huxley, MD;  Location: [place] CV LAB;  Service: Cardiovascular;  Laterality: N/A;   PORTA CATH REMOVAL N/A 06/15/2022   Procedure: PORTA CATH REMOVAL;  Surgeon: Algernon Huxley, MD;  Location: [place] CV LAB;  Service: Cardiovascular;  Laterality: N/A;   SKIN CANCER EXCISION     face and neck   TEE WITHOUT CARDIOVERSION N/A 08/17/2022   Procedure:  TRANSESOPHAGEAL ECHOCARDIOGRAM (TEE);  Surgeon: Dionisio David, MD;  Location: [facility] ORS;  Service: Cardiovascular;  Laterality: N/A;   TOTAL LAPAROSCOPIC HYSTERECTOMY WITH BILATERAL SALPINGO OOPHORECTOMY Bilateral 12/24/2021   Procedure: TOTAL LAPAROSCOPIC HYSTERECTOMY WITH BILATERAL SALPINGO OOPHORECTOMY, PELVIC NODE BIOPSY,  PESSARY REMOVAL (PESSARY REMOVAL BY DR. 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  She also finished adjuvant radiation approximately 1 month ago.  Per chart review patient lives alone.  1 level home 3 steps to entry.  Ambulates with a rollator since latest admission.  She does have good family support.  Presented to [facility] with increasing dizziness, left-sided weakness and loss of balance and slurred speech.  Most recent echocardiogram ejection fraction of 60 to 65% no wall motion abnormalities.  CT/MRI follow-up showed interval extension of infarction on the right hemisphere, now affecting the deep insula and more the frontal parietal cortical and subcortical brain consistent with progressive right MCA branch vessel infarction.  No evidence of hemorrhage or mass effect.  Newly seen subcentimeter focus of acute infarct in the left frontal white matter.  Underlying chronic microvascular ischemic disease with a few small remote lacunar infarcts.  MRI cervical spine with no cord insult.  CT angiogram of the chest showed no segmental or pulmonary emboli.  Lower extremity Dopplers negative for DVT.  Admission  chemistries unremarkable except sodium 131, troponin 89 D-dimer elevated 10.67.  Follow-up TEE negative.  Follow-up neurology services in light of recurrent CVA concern for hypercoagulopathy in the setting of malignancy was placed on low-grade heparin and transitioned to Eliquis and would also remain on low.  Maintain on a regular consistency diet.  Therapy evaluations completed due to patient's left-sided weakness decreased functional mobility was admitted for a comprehensive rehab program.  SLP consulted to complete cognitive-communication evaluation in the setting of acute R MCA CVA. Pt encountered awake/alert and OOB in w/c. Voiced appreciated for OT evaluation where she was able to shower and change into clean clothes. Pt endorses wearing hearing aids and glasses at baselinel; however, did not want SLP to place her hearing aids and she did not feel she could do this safely herself. SLP attempted to encourage and educate, with pt continuing to decline donning.  Per [facility] SLUMS evaluation and informal assessment measures, pt presents with at least moderate cognitive deficits, as evident by receiving a score of 12/30 (n = 27 for individuals with a high school diploma). Deficits noted in the domains of higher level attention + L inattention, recall, executive functioning, and visuospatial skills. Pt was noted to exhibit decreased eye contact when SLP was seated in pt's L visual field vs when SLP sat in pt's R visual field. Speech appears fluent and  intelligible, with receptive and expressive language skills appearing grossly intact for functional communication and comprehension. OME unremarkable.  Pt unable to fully report on her cognitive function PTA; however, was living alone and completing all ADL's and IADL's independently. Cannot r/o decreased hearing acuity and the impact it may have had on pt's performance during today's assessment. Nevertheless, suspect pt's behavior and performance is not consistent  with baseline given pt's report that she felt she did not do well. Brief observation of swallow function observed with sips of water with no apparent oropharyngeal deficits appreciated. Continue with regular diet and thin liquids.  At this time, pt presents with functional deficits, in her cognitive abilities, that would make her previously completed tasks very difficult and could potentially result in increased errors. Therefore, recommend skilled ST intervention to maximize pt's independence and decrease caregiver burden. Results and recommendations reviewed with pt who verbalized understanding and agreement with proposed ST POC. Anticipate pt will need 24/7 supervision and assistance and continued ST intervention at time of d/c from IPR.  SLUMS Orientation: 2/3 Mental Math Calculations: 1/3 Generative Naming: 1/3 Delayed Recall: 2/5 Attention + Working Memory: 0/2 Clock Drawing: 0/4 Visuospatial skills: 2/2 Short Story Recall: 4/8   Skilled Therapeutic Interventions          Cognitive-linguistic evaluation via administration of [facility] SLUMS assessment. Please see above for details.   SLP Assessment  Patient will need skilled Speech Lanaguage Pathology Services during CIR admission    Recommendations  Recommendations for Other Services: Neuropsych consult;Therapeutic Recreation consult Therapeutic Recreation Interventions: Outing/community reintergration Patient destination: Home Follow up Recommendations: Home Health SLP;Outpatient SLP;24 hour supervision/assistance Equipment Recommended: None recommended by SLP    SLP Frequency 3 to 5 out of 7 days   SLP Duration  SLP Intensity  SLP Treatment/Interventions 2 weeks  Minumum of 1-2 x/day, 30 to 90 minutes  Cognitive remediation/compensation;Functional tasks;Internal/external aids;Medication managment;Patient/family education    Pain Pain Assessment Pain Scale: 0-10 Pain Score: 0-No pain  Prior  Functioning Cognitive/Linguistic Baseline: Within functional limits Type of Home: House  Lives With: Alone Available Help at Discharge: Family;Available PRN/intermittently (Pt states son and daughter both work) Vocation: Retired (Previously a Marine scientist)  SLP Evaluation Cognition Overall Cognitive Status: Impaired/Different from baseline Arousal/Alertness: Awake/alert Orientation Level: Oriented X4 Year: 2023 Attention: Selective;Sustained;Focused Focused Attention: Appears intact Sustained Attention: Appears intact Selective Attention: Impaired Selective Attention Impairment: Verbal basic;Functional basic Memory: Impaired Memory Impairment: Retrieval deficit;Decreased short term memory;Storage deficit Awareness: Impaired Awareness Impairment: Anticipatory impairment Problem Solving: Impaired Problem Solving Impairment: Verbal basic;Functional basic Safety/Judgment: Impaired  Comprehension Auditory Comprehension Overall Auditory Comprehension: Appears within functional limits for tasks assessed Visual Recognition/Discrimination Discrimination: Not tested Reading Comprehension Reading Status: Not tested Expression Expression Primary Mode of Expression: Verbal Verbal Expression Overall Verbal Expression: Appears within functional limits for tasks assessed Initiation: No impairment Written Expression Dominant Hand: Right Written Expression: Not tested Oral Motor Oral Motor/Sensory Function Overall Oral Motor/Sensory Function: Within functional limits Motor Speech Overall Motor Speech: Appears within functional limits for tasks assessed Motor Planning: Witnin functional limits  Care Tool Care Tool Cognition Ability to hear (with hearing aid or hearing appliances if normally used Ability to hear (with hearing aid or hearing appliances if normally used): 1. Minimal difficulty - difficulty in some environments (e.g. when person speaks softly or setting is noisy)   Expression  of Ideas and Wants Expression of Ideas and Wants: 3. Some difficulty - exhibits some difficulty with expressing needs and ideas (e.g, some words or finishing  thoughts) or speech is not clear   Understanding Verbal and Non-Verbal Content Understanding Verbal and Non-Verbal Content: 3. Usually understands - understands most conversations, but misses some part/intent of message. Requires cues at times to understand  Memory/Recall Ability Memory/Recall Ability : That he or she is in a hospital/hospital unit    Bedside Swallowing Assessment Not completed; pt on regular diet and thin liquids with no swallowing needs identified during acute hospitalization at [facility].  Short Term Goals: Week 1: SLP Short Term Goal 1 (Week 1): Pt will complete daily recall tasks (therapies, therapists, schedule, etc.) with 100% accuracy given Min A. SLP Short Term Goal 2 (Week 1): Pt will complete left visual scanning tasks related to functional tasks with 90% accuracy and Min A. SLP Short Term Goal 3 (Week 1): Pt will complete instrumental ADL tasks related to baseline activities (shopping, telephone, cooking, meal prep, calendar management, medication management, and financial management with 90% accuracy given Min A. SLP Short Term Goal 4 (Week 1): Pt will demonstrate verbal problem-solving and sequencing for ADL and safety tasks with 90% accuracy over 2 sessions given Min A.  Refer to Care Plan for Long Term Goals  Recommendations for other services: Neuropsych and Therapeutic Recreation  Outing/community reintegration  Discharge Criteria: Patient will be discharged from SLP if patient refuses treatment 3 consecutive times without medical reason, if treatment goals not met, if there is a change in medical status, if patient makes no progress towards goals or if patient is discharged from hospital.  The above assessment, treatment plan, treatment alternatives and goals were discussed and mutually agreed upon: by  patient  Romelle Starcher A Andreal Vultaggio 08/21/2022, 3:47 PM

## 2022-08-21 NOTE — IPOC Note (Signed)
Overall Plan of Care [facility]) Patient Details Name: Elizabeth Friedman MRN: [id number] DOB: 12-06-1940  Admitting Diagnosis: Acute ischemic right MCA stroke [facility])  Hospital Problems: Principal Problem:   Acute ischemic right MCA stroke [facility])     Functional Problem List: Nursing Bowel, Safety, Medication Management, Endurance, Pain  PT Balance, Endurance, Motor, Safety, Sensory, Skin Integrity  OT Safety, Perception, Balance, Sensory, Motor, Cognition  SLP Cognition  TR         Basic ADL's: OT Grooming, Bathing, Toileting, Dressing     Advanced  ADL's: OT Laundry, Simple Meal Preparation, Light Housekeeping     Transfers: PT Bed Mobility, Bed to Chair, Administrator, arts, Agricultural engineer: PT Ambulation, Stairs     Additional Impairments: OT Fuctional Use of Upper Extremity  SLP Social Cognition   Problem Solving, Memory, Attention, Awareness  TR      Anticipated Outcomes Item Anticipated Outcome  Self Feeding Independent  Swallowing  N/A   Basic self-care  Modified Independent  Toileting  Modified Independent   Bathroom Transfers Modified Independnet  Bowel/Bladder  manage bowel w mod I assist  Transfers  supervision  Locomotion  supervision  Communication  N/A  Cognition  Sup A  Pain  < 4 with prns  Safety/Judgment  manage w cues   Therapy Plan: PT Intensity: Minimum of 1-2 x/day ,45 to 90 minutes PT Frequency: 5 out of 7 days PT Duration Estimated Length of Stay: 2 weeks OT Intensity: Minimum of 1-2 x/day, 45 to 90 minutes OT Frequency: 5 out of 7 days OT Duration/Estimated Length of Stay: 10-14 days SLP Intensity: Minumum of 1-2 x/day, 30 to 90 minutes SLP Frequency: 3 to 5 out of 7 days SLP Duration/Estimated Length of Stay: 2 weeks   Team Interventions: Nursing Interventions Patient/Family Education, Bowel Management, Disease Management/Prevention, Pain Management, Medication Management, Discharge Planning  PT interventions  Ambulation/gait training, Discharge planning, Functional mobility training, Psychosocial support, Therapeutic Activities, Visual/perceptual remediation/compensation, Wheelchair propulsion/positioning, Therapeutic Exercise, Skin care/wound management, Neuromuscular re-education, Disease management/prevention, Training and development officer, Cognitive remediation/compensation, DME/adaptive equipment instruction, Pain management, Splinting/orthotics, UE/LE Strength taining/ROM, UE/LE Coordination activities, Stair training, Patient/family education, Functional electrical stimulation, Community reintegration  OT Interventions Training and development officer, Academic librarian, Technical sales engineer stimulation, Neuromuscular re-education, Barrister's clerk education, Self Care/advanced ADL retraining, Therapeutic Exercise, UE/LE Coordination activities, Wheelchair propulsion/positioning, Cognitive remediation/compensation, Discharge planning, DME/adaptive equipment instruction, Functional mobility training, Psychosocial support, Skin care/wound managment, Therapeutic Activities, UE/LE Strength taining/ROM, Visual/perceptual remediation/compensation  SLP Interventions Cognitive remediation/compensation, Functional tasks, Internal/external aids, Medication managment, Patient/family education  TR Interventions    SW/CM Interventions Discharge Planning, Psychosocial Support, Patient/Family Education, Disease Management/Prevention   Barriers to Discharge MD   Pain limitation LUE  Nursing Decreased caregiver support, Home environment access/layout 1 level 2 ste right rail w son  PT Decreased caregiver support, Lack of/limited family support, Insurance underwriter for SNF coverage, Weight    OT      SLP Decreased caregiver support    SW Insurance for SNF coverage, Decreased caregiver support, Lack of/limited family support, Inaccessible home environment     Team Discharge Planning: Destination: PT-Assisted Living ,OT- Home  (Pt open to goingt to ALF or SNF if requiring assistance at dc. Need to discuss availability for family support with family members.) , SLP-Home Projected Follow-up: PT-Home health PT, 24 hour supervision/assistance, OT-  Skilled nursing facility, 24 hour supervision/assistance (Pt wanting to go to ALF d/t fmaily being out of town during [employer]), [place], Outpatient SLP, 24 hour supervision/assistance  Projected Equipment Needs: PT-To be determined, OT- To be determined, SLP-None recommended by SLP Equipment Details: PT- , OT-  Patient/family involved in discharge planning: PT- Patient,  OT-Patient, SLP-Patient  MD ELOS: 10-14d Medical Rehab Prognosis:  Good Assessment: The patient has been admitted for CIR therapies with the diagnosis of CVA. The team will be addressing functional mobility, strength, stamina, balance, safety, adaptive techniques and equipment, self-care, bowel and bladder mgt, patient and caregiver education, Left upper ext pain. Goals have been set at Mod I/Sup. Anticipated discharge destination is home .        See Team Conference Notes for weekly updates to the plan of care

## 2022-08-21 NOTE — Discharge Instructions (Signed)
Inpatient Rehab Discharge Instructions  [facility] Discharge date and time: No discharge date for patient encounter.   Activities/Precautions/ Functional Status: Activity: activity as tolerated Diet: regular diet Wound Care: Routine skin checks Functional status:  ___ No restrictions     ___ Walk up steps independently ___ 24/7 supervision/assistance   ___ Walk up steps with assistance ___ Intermittent supervision/assistance  ___ Bathe/dress independently ___ Walk with walker     _x__ Bathe/dress with assistance ___ Walk Independently    ___ Shower independently ___ Walk with assistance    ___ Shower with assistance ___ No alcohol     ___ Return to work/school ________  Special Instructions: No driving smoking or alcoholSTROKE/TIA DISCHARGE INSTRUCTIONS SMOKING Cigarette smoking nearly doubles your risk of having a stroke & is the single most alterable risk factor  If you smoke or have smoked in the last 12 months, you are advised to quit smoking for your health. Most of the excess cardiovascular risk related to smoking disappears within a year of stopping. Ask you doctor about anti-smoking medications [place] Quit Line: 1-800-QUIT NOW Free Smoking Cessation Classes [phone]  CHOLESTEROL Know your levels; limit fat & cholesterol in your diet  Lipid Panel     Component Value Date/Time   CHOL 152 08/14/2022 0500   TRIG 91 08/14/2022 0500   HDL 48 08/14/2022 0500   CHOLHDL 3.2 08/14/2022 0500   VLDL 18 08/14/2022 0500   LDLCALC 86 08/14/2022 0500     Many patients benefit from treatment even if their cholesterol is at goal. Goal: Total Cholesterol (CHOL) less than 160 Goal:  Triglycerides (TRIG) less than 150 Goal:  HDL greater than 40 Goal:  LDL (LDLCALC) less than 100   BLOOD PRESSURE American Stroke Association blood pressure target is less that 120/80 mm/Hg  Your discharge blood pressure is:  BP: (!) 122/58 Monitor your blood pressure Limit your salt and alcohol  intake Many individuals will require more than one medication for high blood pressure  DIABETES (A1c is a blood sugar average for last 3 months) Goal HGBA1c is under 7% (HBGA1c is blood sugar average for last 3 months)  Diabetes: No known diagnosis of diabetes    Lab Results  Component Value Date   HGBA1C 5.7 (H) 08/08/2022    Your HGBA1c can be lowered with medications, healthy diet, and exercise. Check your blood sugar as directed by your physician Call your physician if you experience unexplained or low blood sugars.  PHYSICAL ACTIVITY/REHABILITATION Goal is 30 minutes at least 4 days per week  Activity: Increase activity slowly, Therapies: Physical Therapy: Home Health Return to work:  Activity decreases your risk of heart attack and stroke and makes your heart stronger.  It helps control your weight and blood pressure; helps you relax and can improve your mood. Participate in a regular exercise program. Talk with your doctor about the best form of exercise for you (dancing, walking, swimming, cycling).  DIET/WEIGHT Goal is to maintain a healthy weight  Your discharge diet is:  Diet Order             Diet Heart Room service appropriate? Yes; Fluid consistency: Thin  Diet effective now                   liquids Your height is:  Height: '5\' 2"'$  (157.5 cm) Your current weight is: Weight: 42.5 kg Your Body Mass Index (BMI) is:  BMI (Calculated): 17.13 Following the type of diet specifically designed for  you will help prevent another stroke. Your goal weight range is:   Your goal Body Mass Index (BMI) is 19-24. Healthy food habits can help reduce 3 risk factors for stroke:  High cholesterol, hypertension, and excess weight.  RESOURCES Stroke/Support Group:  Call [phone]   STROKE EDUCATION PROVIDED/REVIEWED AND GIVEN TO PATIENT Stroke warning signs and symptoms How to activate emergency medical system (call 911). Medications prescribed at discharge. Need for follow-up  after discharge. Personal risk factors for stroke. Pneumonia vaccine given: No Flu vaccine given: No My questions have been answered, the writing is legible, and I understand these instructions.  I will adhere to these goals & educational materials that have been provided to me after my discharge from the hospital.       My questions have been answered and I understand these instructions. I will adhere to these goals and the provided educational materials after my discharge from the hospital.  Patient/Caregiver Signature _______________________________ Date __________  Clinician Signature _______________________________________ Date __________  Please bring this form and your medication list with you to all your follow-up doctor's appointments.

## 2022-08-21 NOTE — Progress Notes (Signed)
PROGRESS NOTE   Subjective/Complaints:  Working with OT, no complaints today, hard of hearing, sits at edge of bed without support Reviewed labs evidence of anemia but otherwise normal  Objective:   No results found. Recent Labs    08/20/22 0441 08/21/22 0728  WBC 5.4 5.6  HGB 9.8* 10.9*  HCT 28.9* 32.0*  PLT 140* 157   Recent Labs    08/19/22 0443  NA 138  K 3.6  CL 109  CO2 24  GLUCOSE 98  BUN 11  CREATININE 0.76  CALCIUM 9.1    Intake/Output Summary (Last 24 hours) at 08/21/2022 0834 Last data filed at 08/21/2022 2703 Gross per 24 hour  Intake 120 ml  Output --  Net 120 ml        Physical Exam: Vital Signs Blood pressure (!) 122/58, pulse 63, temperature 98.9 F (37.2 C), temperature source Oral, resp. rate 18, height '5\' 2"'$  (1.575 m), weight 42.5 kg, SpO2 100 %.   General: No acute distress Mood and affect are appropriate Heart: Regular rate and rhythm no rubs murmurs or extra sounds Lungs: Clear to auscultation, breathing unlabored, no rales or wheezes Abdomen: Positive bowel sounds, soft nontender to palpation, nondistended Extremities: No clubbing, cyanosis, or edema Skin: No evidence of breakdown, no evidence of rash Neurologic: Cranial nerves II through XII intact, motor strength is 5/5 in right deltoid, bicep, tricep, grip, bilateral hip flexor, knee extensors, ankle dorsiflexor and plantar flexor 4/5 left deltoid bicep tricep grip Sensory exam normal sensation to light touch left upper and lower extremities Cerebellar exam normal finger to nose to finger  in bilateral upper  Finger to thumb opposition is slower on the left side compared to the right side Musculoskeletal: Full range of motion in all 4 extremities. No joint swelling   Assessment/Plan: 1. Functional deficits which require 3+ hours per day of interdisciplinary therapy in a comprehensive inpatient rehab setting. Physiatrist  is providing close team supervision and 24 hour management of active medical problems listed below. Physiatrist and rehab team continue to assess barriers to discharge/monitor patient progress toward functional and medical goals  Care Tool:  Bathing              Bathing assist       Upper Body Dressing/Undressing Upper body dressing        Upper body assist      Lower Body Dressing/Undressing Lower body dressing            Lower body assist       Toileting Toileting    Toileting assist       Transfers Chair/bed transfer  Transfers assist           Locomotion Ambulation   Ambulation assist              Walk 10 feet activity   Assist           Walk 50 feet activity   Assist           Walk 150 feet activity   Assist           Walk 10 feet on uneven surface  activity   Assist  Wheelchair     Assist               Wheelchair 50 feet with 2 turns activity    Assist            Wheelchair 150 feet activity     Assist          Blood pressure (!) 122/58, pulse 63, temperature 98.9 F (37.2 C), temperature source Oral, resp. rate 18, height '5\' 2"'$  (1.575 m), weight 42.5 kg, SpO2 100 %.  Medical Problem List and Plan: 1. Functional deficits secondary to recurrent acute ischemic strokes in multiple vascular territories right hemisphere affecting deep insula and more of the frontal parietal cortical and subcortical brain consistent with progressive right MCA branch vessel infarction.             -patient may shower             -ELOS/Goals: 10-14 days 2.  Antithrombotics:            -DVT/anticoagulation:  Pharmaceutical: Eliquis            -antiplatelet therapy: Aspirin 81 mg daily            - Hypercoagulable work-up negative so far, pending factor V Leyden and prothrombin gene mutation. 3. Pain Management: Neurontin 400 mg twice daily, Topamax 50 mg daily.  Tylenol as needed.   4.  Mood/Behavior/Sleep: Trazodone as needed sleep             -antipsychotic agents: N/A   5. Neuropsych/cognition: This patient is capable of making decisions on her own behalf.   6. Skin/Wound Care: Routine skin checks 7. Fluids/Electrolytes/Nutrition: Routine in and outs with follow-up chemistries                -Chronic dysphagia; takes pills in applesauce/yogurt. Order placed.    8.  History of uterine/breast cancer.  Follow-up oncology services outpatient.  Patient finished 3 months of chemotherapy as well as recent radiation therapy.   9.  Hyperlipidemia.  Lipitor 10.  Chronic anemia.  Continue iron supplement daily 11.  GERD.  Protonix BID per patient. Prn maalox.  12.  Hypertension.  Norvasc 5 mg daily.  Monitor with increased mobility     08/20/2022    7:30 PM 08/20/2022    5:02 PM 08/20/2022    3:32 PM  Vitals with BMI  Height   '5\' 2"'$     Weight   93 lbs 11 oz    BMI   27.51    Systolic 700 174    Diastolic 79 88    Pulse 84 86 83    13.  Bronchiectasis.  Patient supposed to be on azithromycin for chronic bronchiectasis for 3 months 3 times a week per Dr.Aleskerov note 06/2022.  Azithromycin has been resumed.      LOS: 1 days A FACE TO FACE EVALUATION WAS PERFORMED  Charlett Blake 08/21/2022, 8:34 AM

## 2022-08-21 NOTE — Plan of Care (Signed)
  Problem: RH Problem Solving Goal: LTG Patient will demonstrate problem solving for (SLP) Description: LTG:  Patient will demonstrate problem solving for basic/complex daily situations with cues  (SLP) Flowsheets (Taken 08/21/2022 1608) LTG: Patient will demonstrate problem solving for (SLP): Complex daily situations LTG Patient will demonstrate problem solving for: Minimal Assistance - Patient > 75%   Problem: RH Memory Goal: LTG Patient will demonstrate ability for day to day (SLP) Description: LTG:   Patient will demonstrate ability for day to day recall/carryover during cognitive/linguistic activities with assist  (SLP) Flowsheets (Taken 08/21/2022 1608) LTG: Patient will demonstrate ability for day to day recall:  New information  Daily complex information LTG: Patient will demonstrate ability for day to day recall/carryover during cognitive/linguistic activities with assist (SLP): Minimal Assistance - Patient > 75% Goal: LTG Patient will use memory compensatory aids to (SLP) Description: LTG:  Patient will use memory compensatory aids to recall biographical/new, daily complex information with cues (SLP) Flowsheets (Taken 08/21/2022 1608) LTG: Patient will use memory compensatory aids to (SLP): Supervision   Problem: RH Attention Goal: LTG Patient will demonstrate this level of attention during functional activites (SLP) Description: LTG:  Patient will will demonstrate this level of attention during functional activites (SLP) Flowsheets (Taken 08/21/2022 1608) Patient will demonstrate during cognitive/linguistic activities the attention type of: Selective LTG: Patient will demonstrate this level of attention during cognitive/linguistic activities with assistance of (SLP): Supervision Number of minutes patient will demonstrate attention during cognitive/linguistic activities: 15   Problem: RH Awareness Goal: LTG: Patient will demonstrate awareness during functional activites type of  (SLP) Description: LTG: Patient will demonstrate awareness during functional activites type of (SLP) Flowsheets (Taken 08/21/2022 1608) Patient will demonstrate during cognitive/linguistic activities awareness type of:  Emergent  Anticipatory LTG: Patient will demonstrate awareness during cognitive/linguistic activities with assistance of (SLP): Minimal Assistance - Patient > 75%

## 2022-08-21 NOTE — Progress Notes (Signed)
Pt became unresponsive following bowel movement during therapy session. Pt transferred to bed and placed in trendelenburg position. Pt vitals obtained and reassessment completed. Dan PA at bedside. New orders received. Pt alert and oriented x4. Slurred speech noted and responses are delayed. Pt remains responsive at this time.   08/21/22 1410  Charting Type  Charting Type Full Reassessment Changes Noted  Neurological  Neuro (WDL) X  Orientation Level Oriented X4  Speech Slurred/Dysarthria;Delayed responses  R Pupil Size (mm) 2  R Pupil Shape Round  R Pupil Reaction Sluggish  L Pupil Size (mm) 2  L Pupil Shape Round  L Pupil Reaction Sluggish  R Hand Grip Weak  L Hand Grip Weak   R Foot Dorsiflexion Weak  L Foot Dorsiflexion Weak  R Foot Plantar Flexion Weak  L Foot Plantar Flexion Weak  RUE Motor Response Purposeful movement  RUE Sensation Full sensation  RUE Motor Strength 4  LUE Motor Response Purposeful movement  LUE Sensation Full sensation  LUE Motor Strength 2  RLE Motor Response Purposeful movement  RLE Sensation Full sensation  RLE Motor Strength 4  LLE Motor Response Purposeful movement  LLE Sensation Full sensation  LLE Motor Strength 4  Neuro Symptoms Drowsiness  HEENT  HEENT (WDL) X  Voice Difficulty speaking  Respiratory  Respiratory (WDL) WDL  Cardiac  Cardiac (WDL) WDL  Vascular  Vascular (WDL) WDL  RUE Neurovascular Assessment  R Radial Pulse +2  LUE Neurovascular Assessment  L Radial Pulse +2  RLE Neurovascular Assessment  R Dorsalis Pedis Pulse +1  LLE Neurovascular Assessment  L Dorsalis Pedis Pulse +1  Musculoskeletal Details  RUE Weakness  LUE Weakness  RLE Weakness  LLE Weakness

## 2022-08-21 NOTE — Progress Notes (Signed)
Inpatient Rehabilitation Center Individual Statement of Services  Patient Name:  Elizabeth Friedman  Date:  08/21/2022  Welcome to the [facility].  Our goal is to provide you with an individualized program based on your diagnosis and situation, designed to meet your specific needs.  With this comprehensive rehabilitation program, you will be expected to participate in at least 3 hours of rehabilitation therapies Monday-Friday, with modified therapy programming on the weekends.  Your rehabilitation program will include the following services:  Physical Therapy (PT), Occupational Therapy (OT), Speech Therapy (ST), 24 hour per day rehabilitation nursing, Therapeutic Recreaction (TR), Neuropsychology, Care Coordinator, Rehabilitation Medicine, Nutrition Services, Pharmacy Services, and Other  Weekly team conferences will be held on Wednesdays to discuss your progress.  Your Inpatient Rehabilitation Care Coordinator will talk with you frequently to get your input and to update you on team discussions.  Team conferences with you and your family in attendance may also be held.  Expected length of stay: 12-14 Days  Overall anticipated outcome:  MOD I  Depending on your progress and recovery, your program may change. Your Inpatient Rehabilitation Care Coordinator will coordinate services and will keep you informed of any changes. Your Inpatient Rehabilitation Care Coordinator's name and contact numbers are listed  below.  The following services may also be recommended but are not provided by the [facility]:   [place] will be made to provide these services after discharge if needed.  Arrangements include referral to agencies that provide these services.  Your insurance has been verified to be:   HTA Your primary doctor is:  Emerson Monte, MD  Pertinent information will be shared with  your doctor and your insurance company.  Inpatient Rehabilitation Care Coordinator:  Erlene Quan, [place] or [phone]  Information discussed with and copy given to patient by: Dyanne Iha, 08/21/2022, 10:48 AM

## 2022-08-21 NOTE — Progress Notes (Signed)
Inpatient Rehabilitation  Patient information reviewed and entered into eRehab system by Sayda Grable M. Quantina Dershem, M.A., CCC/SLP, PPS Coordinator.  Information including medical coding, functional ability and quality indicators will be reviewed and updated through discharge.    

## 2022-08-21 NOTE — Evaluation (Signed)
Physical Therapy Assessment and Plan  Patient Details  Name: Elizabeth Friedman MRN: [id number] Date of Birth: February 18, 1941  PT Diagnosis: Abnormal posture, Abnormality of gait, Cognitive deficits, Difficulty walking, Hemiplegia non-dominant, Impaired cognition, Impaired sensation, and Muscle weakness Rehab Potential: Good ELOS: 2 weeks   Today's Date: 08/21/2022 PT Individual Time: 1300-1415 PT Individual Time Calculation (min): 75 min    Hospital Problem: Principal Problem:   Acute ischemic right MCA stroke ([place])   Past Medical History:  Past Medical History:  Diagnosis Date   Allergic rhinitis    Anemia    Anginal pain ([place])    Arthritis    Atypical chest pain    Breast cancer ([place])    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[place])    Ductal carcinoma in situ (DCIS) of right breast    Fibrocystic breast disease    GERD (gastroesophageal reflux disease)    Hearing aid worn    bilateral   History of hiatal hernia    Hyperlipidemia    Lung nodule    Menopause    Migraine    Migraine    optical migraines   Motion sickness    Plantar fasciitis    Prolapse of female pelvic organs    hodge pessary   Raynaud's disease    Sciatic leg pain    Sciatica    Skin cancer of face    Stroke [facility])    Urinary retention with incomplete bladder emptying    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   Vitreous detachment of both eyes    Past Surgical History:  Past Surgical History:  Procedure Laterality Date   APPENDECTOMY  1951   BREAST LUMPECTOMY WITH RADIOACTIVE SEED LOCALIZATION Right 05/29/2020   Procedure: RIGHT BREAST LUMPECTOMY WITH RADIOACTIVE SEED LOCALIZATION;  Surgeon: Rolm Bookbinder, MD;  Location: [place];  Service: General;  Laterality: Right;   CARDIAC CATHETERIZATION  2004   negative   CATARACT EXTRACTION W/PHACO Right 02/26/2021   Procedure: CATARACT EXTRACTION PHACO AND INTRAOCULAR LENS PLACEMENT (IOC) RIGHT 5.86 01:19.6 7.4%;  Surgeon: Leandrew Koyanagi, MD;   Location: [place];  Service: Ophthalmology;  Laterality: Right;   CATARACT EXTRACTION W/PHACO Left 09/03/2021   Procedure: CATARACT EXTRACTION PHACO AND INTRAOCULAR LENS PLACEMENT ([place]) LEFT;  Surgeon: Leandrew Koyanagi, MD;  Location: [place];  Service: Ophthalmology;  Laterality: Left;  9.61 01:15.9   COLONOSCOPY     2000, 2006, 2012   COLONOSCOPY WITH PROPOFOL N/A 03/27/2016   Procedure: COLONOSCOPY WITH PROPOFOL;  Surgeon: Manya Silvas, MD;  Location: [facility] ENDOSCOPY;  Service: Endoscopy;  Laterality: N/A;   COLONOSCOPY WITH PROPOFOL N/A 08/14/2020   Procedure: COLONOSCOPY WITH PROPOFOL;  Surgeon: Toledo, Benay Pike, MD;  Location: [facility] ENDOSCOPY;  Service: Gastroenterology;  Laterality: N/A;   CYSTOSCOPY N/A 12/24/2021   Procedure: CYSTOSCOPY;  Surgeon: Jaquita Folds, MD;  Location: [facility] ORS;  Service: Gynecology;  Laterality: N/A;   DILATATION & CURETTAGE/HYSTEROSCOPY WITH MYOSURE N/A 12/01/2021   Procedure: DILATATION & CURETTAGE/HYSTEROSCOPY WITH MYOSURE;  Surgeon: Rubie Maid, MD;  Location: [facility] ORS;  Service: Gynecology;  Laterality: N/A;   ESOPHAGOGASTRODUODENOSCOPY  08/1999   ESOPHAGOGASTRODUODENOSCOPY (EGD) WITH PROPOFOL N/A 08/14/2020   Procedure: ESOPHAGOGASTRODUODENOSCOPY (EGD) WITH PROPOFOL;  Surgeon: Toledo, Benay Pike, MD;  Location: [facility] ENDOSCOPY;  Service: Gastroenterology;  Laterality: N/A;   ESOPHAGOGASTRODUODENOSCOPY (EGD) WITH PROPOFOL N/A 03/21/2021   Procedure: ESOPHAGOGASTRODUODENOSCOPY (EGD) WITH PROPOFOL;  Surgeon: Toledo, Benay Pike, MD;  Location: [facility] ENDOSCOPY;  Service: Gastroenterology;  Laterality: N/A;   PORTA CATH INSERTION  N/A 01/15/2022   Procedure: PORTA CATH INSERTION;  Surgeon: Algernon Huxley, MD;  Location: [place] CV LAB;  Service: Cardiovascular;  Laterality: N/A;   PORTA CATH REMOVAL N/A 06/15/2022   Procedure: PORTA CATH REMOVAL;  Surgeon: Algernon Huxley, MD;  Location: [place] CV LAB;  Service:  Cardiovascular;  Laterality: N/A;   SKIN CANCER EXCISION     face and neck   TEE WITHOUT CARDIOVERSION N/A 08/17/2022   Procedure: TRANSESOPHAGEAL ECHOCARDIOGRAM (TEE);  Surgeon: Dionisio David, MD;  Location: [facility] ORS;  Service: Cardiovascular;  Laterality: N/A;   TOTAL LAPAROSCOPIC HYSTERECTOMY WITH BILATERAL SALPINGO OOPHORECTOMY Bilateral 12/24/2021   Procedure: TOTAL LAPAROSCOPIC HYSTERECTOMY WITH BILATERAL SALPINGO OOPHORECTOMY, PELVIC NODE BIOPSY,  PESSARY REMOVAL (PESSARY REMOVAL BY DR. 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  She also finished adjuvant radiation approximately 1 month ago.  Per chart review patient lives alone.  1 level home 3 steps to entry.  Ambulates with a rollator since latest admission.  She does have good family support.  Presented to [facility]/13/2022 with increasing dizziness, left-sided weakness and loss of balance and slurred speech.  Most recent echocardiogram ejection fraction of 60 to 65% no wall motion abnormalities.  CT/MRI follow-up showed interval extension of infarction on the right hemisphere, now affecting the deep insula and more the frontal parietal cortical and subcortical brain consistent with progressive right MCA branch vessel infarction.  No evidence of hemorrhage or mass effect.  Newly seen subcentimeter focus of acute infarct in the left frontal white matter.  Underlying chronic microvascular ischemic disease with a few small remote lacunar infarcts.  MRI cervical spine with no cord insult.  CT angiogram of the  chest showed no segmental or pulmonary emboli.  Lower extremity Dopplers negative for DVT.  Admission chemistries unremarkable except sodium 131, troponin 89 D-dimer elevated 10.67.  Follow-up TEE negative.  Follow-up neurology services in light of recurrent CVA concern for hypercoagulopathy in the setting of malignancy was placed on low-grade heparin and transitioned to Eliquis and would also remain on low.  Maintain on a regular consistency diet.  Therapy evaluations completed due to patient's left-sided weakness decreased functional mobility was admitted for a comprehensive rehab program.Patient transferred to CIR on 08/20/2022 .   Patient currently requires min with mobility secondary to muscle weakness, decreased cardiorespiratoy endurance, unbalanced muscle activation, motor apraxia, and decreased coordination, decreased visual perceptual skills, decreased attention to left and decreased motor planning, decreased attention, decreased awareness, decreased problem solving, and decreased safety awareness, and decreased standing balance, decreased postural control, hemiplegia, and decreased balance strategies.  Prior to hospitalization, patient was independent  with mobility and lived with Alone in a House home.  Home access is 2-3 steps on the side with pull barStairs to enter.  Patient will benefit from skilled PT intervention to maximize safe functional mobility, minimize fall risk, and decrease caregiver burden for planned discharge home with 24 hour supervision.  Anticipate patient will benefit from follow up [place] at discharge.  PT - End of Session Activity Tolerance: Tolerates < 10 min activity with changes in vital signs  Endurance Deficit: Yes PT Assessment Rehab Potential (ACUTE/IP ONLY): Good PT Barriers to Discharge: Decreased caregiver support;Lack of/limited family support;Insurance for SNF coverage;Weight PT Patient demonstrates impairments in the following area(s):  Balance;Endurance;Motor;Safety;Sensory;Skin Integrity PT Transfers Functional Problem(s): Bed Mobility;Bed to Chair;Car PT Locomotion Functional Problem(s): Ambulation;Stairs PT Plan PT Intensity: Minimum of 1-2 x/day ,45 to 90 minutes PT Frequency: 5 out of 7 days PT Duration Estimated Length of Stay: 2 weeks PT Treatment/Interventions: Ambulation/gait training;Discharge planning;Functional mobility training;Psychosocial support;Therapeutic Activities;Visual/perceptual remediation/compensation;Wheelchair propulsion/positioning;Therapeutic Exercise;Skin care/wound management;Neuromuscular re-education;Disease management/prevention;Balance/vestibular training;Cognitive remediation/compensation;DME/adaptive equipment instruction;Pain management;Splinting/orthotics;UE/LE Strength taining/ROM;UE/LE Coordination activities;Stair training;Patient/family education;Functional electrical stimulation;Community reintegration PT Transfers Anticipated Outcome(s): supervision PT Locomotion Anticipated Outcome(s): supervision PT Recommendation Follow Up Recommendations: Home health PT;24 hour supervision/assistance Patient destination: Assisted Living Equipment Recommended: To be determined   PT Evaluation Precautions/Restrictions Precautions Precautions: Fall Precaution Comments: L hemi (UE > LE), L inattention Restrictions Weight Bearing Restrictions: No Pain Pain Assessment Pain Scale: 0-10 Pain Score: 0-No pain Pain Interference Pain Interference Pain Effect on Sleep: 0. Does not apply - I have not had any pain or hurting in the past 5 days Pain Interference with Therapy Activities: 0. Does not apply - I have not received rehabilitationtherapy in the past 5 days Pain Interference with Day-to-Day Activities: 1. Rarely or not at all Home Living/Prior [place] Living Arrangements: Alone Available Help at Discharge: Family;Available 24 hours/day Type of Home: House Home Access:  Stairs to enter [employer] of Steps: 2-3 steps on the side with pull bar Entrance Stairs-Rails: Right Home Layout: One level Bathroom Shower/Tub: Tub/shower unit;Walk-in shower Bathroom Toilet: Standard Bathroom Accessibility: Yes Additional Comments: Son lives ~20 min away, daughter ~30  Lives With: Alone Prior Function Level of Independence: Independent with gait;Independent with transfers;Independent with homemaking with ambulation  Able to Take Stairs?: Yes Driving: Yes Vocation: Retired Vision/Perception  Vision - History Ability to See in Adequate Light: 1 Impaired Vision - Assessment Eye Alignment: Within Functional Limits Ocular Range of Motion: Within Functional Limits Alignment/Gaze Preference: Within Defined Limits Tracking/Visual Pursuits: Decreased smoothness of horizontal tracking;Decreased smoothness of vertical tracking;Impaired - to be further tested in functional context Saccades: Within functional limits Convergence: Impaired (comment) Perception Perception: Impaired Inattention/Neglect: Does not attend to left visual field;Does not attend to left side of body Praxis Praxis: Impaired Praxis Impairment Details: Motor planning  Cognition Overall Cognitive Status: Within Functional Limits for tasks assessed Arousal/Alertness: Awake/alert Orientation Level: Oriented X4 Attention: Selective;Sustained;Focused Focused Attention: Appears intact Sustained Attention: Appears intact Selective Attention: Impaired Selective Attention Impairment: Verbal basic;Functional basic Memory: Appears intact Awareness: Impaired Awareness Impairment: Anticipatory impairment Problem Solving: Impaired Problem Solving Impairment: Verbal basic;Functional basic Safety/Judgment: Impaired Sensation Sensation Light Touch: Impaired Detail Light Touch Impaired Details: Absent LUE;Impaired LLE Hot/Cold: Not tested Proprioception: Impaired by gross  assessment Stereognosis: Not tested Additional Comments: Absent LUE, distally > proximally Coordination Gross Motor Movements are Fluid and Coordinated: No Fine Motor Movements are Fluid and Coordinated: No Coordination and Movement Description: L hemi (UE > LE) Finger Nose Finger Test: Dysmetria in LUE, undershooting when reaching for figner Heel Shin Test: [facility] - slowed on L Motor  Motor Motor: Hemiplegia;Ataxia;Motor perseverations Motor - Skilled Clinical Observations: Uncoordinated LUE movements   Trunk/Postural Assessment  Cervical Assessment Cervical Assessment: Exceptions to [facility] (forward head) Thoracic Assessment Thoracic Assessment: Exceptions to [facility] (rounded shoulders) Lumbar Assessment Lumbar Assessment: Exceptions to [facility] (posterior pelvic tilt) Postural Control Postural Control: Deficits on evaluation Trunk Control: L trunk lean, posterior bias Righting Reactions: Decreased on L Protective  Responses: Decreased on L  Balance Balance Balance Assessed: Yes Static Sitting Balance Static Sitting - Balance Support: Feet supported;No upper extremity supported Static Sitting - Level of Assistance: 5: Stand by assistance Dynamic Sitting Balance Dynamic Sitting - Balance Support: Feet supported;No upper extremity supported Dynamic Sitting - Level of Assistance: 4: Min Insurance risk surveyor Standing - Balance Support: No upper extremity supported Static Standing - Level of Assistance: 4: Min assist Dynamic Standing Balance Dynamic Standing - Balance Support: During functional activity;No upper extremity supported Dynamic Standing - Level of Assistance: 3: Mod assist Extremity Assessment  RUE Assessment RUE Assessment: Within Functional Limits LUE Assessment LUE Assessment: Exceptions to [facility] General Strength Comments: Decreased hand strength, shoulder 3-/5, bicep 3/5, tricep 3/5, ER/IR 3/5 with increased time for motor plannin LUE Body System: Neuro LUE  AROM (degrees) Overall AROM Left Upper Extremity: Deficits Left Composite Finger Extension: 50% Left Composite Finger Flexion: 50% Left Thumb Opposition: Digit 2 LUE Strength LUE Overall Strength: Deficits      Care Tool Care Tool Bed Mobility Roll left and right activity   Roll left and right assist level: Minimal Assistance - Patient > 75%    Sit to lying activity   Sit to lying assist level: Minimal Assistance - Patient > 75%    Lying to sitting on side of bed activity   Lying to sitting on side of bed assist level: the ability to move from lying on the back to sitting on the side of the bed with no back support.: Minimal Assistance - Patient > 75%     Care Tool Transfers Sit to stand transfer   Sit to stand assist level: Minimal Assistance - Patient > 75%    Chair/bed transfer   Chair/bed transfer assist level: Minimal Assistance - Patient > 75%     Toilet transfer   Assist Level: Minimal Assistance - Patient > 75%    Car transfer Car transfer activity did not occur: Safety/medical concerns        Care Tool Locomotion Ambulation   Assist level: Minimal Assistance - Patient > 75% Assistive device: Walker-rolling Max distance: 167f  Walk 10 feet activity   Assist level: Minimal Assistance - Patient > 75% Assistive device: Walker-rolling   Walk 50 feet with 2 turns activity   Assist level: Minimal Assistance - Patient > 75% Assistive device: Walker-rolling  Walk 150 feet activity   Assist level: Minimal Assistance - Patient > 75% Assistive device: Walker-rolling  Walk 10 feet on uneven surfaces activity Walk 10 feet on uneven surfaces activity did not occur: Safety/medical concerns      Stairs Stair activity did not occur: Safety/medical concerns        Walk up/down 1 step activity Walk up/down 1 step or curb (drop down) activity did not occur: Safety/medical concerns      Walk up/down 4 steps activity Walk up/down 4 steps activity did not occur:  Safety/medical concerns      Walk up/down 12 steps activity Walk up/down 12 steps activity did not occur: Safety/medical concerns      Pick up small objects from floor Pick up small object from the floor (from standing position) activity did not occur: Safety/medical concerns      Wheelchair Is the patient using a wheelchair?: Yes Type of Wheelchair: Manual Wheelchair activity did not occur: Safety/medical concerns      Wheel 50 feet with 2 turns activity Wheelchair 50 feet with 2 turns activity did not occur: Safety/medical concerns  Wheel 150 feet activity Wheelchair 150 feet activity did not occur: Safety/medical concerns      Refer to Care Plan for Long Term Goals  SHORT TERM GOAL WEEK 1 PT Short Term Goal 1 (Week 1): Pt will complete bed mobility with CGA PT Short Term Goal 2 (Week 1): Pt will complete bed<>chair transfers CGA and LRAD PT Short Term Goal 3 (Week 1): Pt will ambulate 175f with CGA and LRAD PT Short Term Goal 4 (Week 1): Pt will navigate up/down 4 steps with CGA and LRAD  Recommendations for other services: None   Skilled Therapeutic Intervention Mobility Bed Mobility Bed Mobility: Rolling Right;Rolling Left Rolling Right: Minimal Assistance - Patient > 75% Rolling Left: Minimal Assistance - Patient > 75% Transfers Transfers: Sit to Stand;Stand to Sit Sit to Stand: Minimal Assistance - Patient > 75% Stand to Sit: Minimal Assistance - Patient > 75% Transfer (Assistive device): Rolling walker Locomotion  Gait Ambulation: Yes Gait Assistance: Minimal Assistance - Patient > 75% Gait Distance (Feet): 150 Feet Assistive device: Rolling walker Gait Assistance Details: Verbal cues for technique;Verbal cues for precautions/safety;Verbal cues for gait pattern;Verbal cues for sequencing;Tactile cues for initiation;Tactile cues for weight shifting;Tactile cues for posture;Manual facilitation for weight shifting;Manual facilitation for placement Gait Gait:  Yes Gait Pattern: Impaired Gait Pattern: Step-through pattern;Decreased stance time - left;Decreased hip/knee flexion - left;Decreased dorsiflexion - left;Decreased weight shift to left;Trunk flexed;Narrow base of support;Poor foot clearance - left Stairs / Additional Locomotion Stairs: No Wheelchair Mobility Wheelchair Mobility: No  Skilled Intervention: Pt sitting in w/c to start and agreeable to PT evaluation. Daughter in room to start but quickly leaving upon PT arrival - daughter [place]   Pt very pleasant and oriented x4, jovial in conversation and provided social factors and PLOF - some difficulty with answering questions directly as she could be tangential. Prior to admission, she lived alone in a 1 lvl home, independent without an AD (OT reports that she was told she used a rollator?). She reports she will not have any family assist at DC as [place]- she is aware that she will need assist at DC and reports that the plan is to pursue ALF for long term care. Care team notified via secure chat to maintain DC planning.  Pt assisted to bathroom as she reports she hasn't toiled since AM - sit<>stands with minA and short distance gait with no AD and minA - inattentive to the L. Continent of bladder on toilet and requires minA for LB management.   Transported to main rehab gym for time and energy conservation.  Gait training using RW for AD - minA 1525f Intermittent toe drag on L, L inattention, narrow BOS, difficulty maintaining L hand grip to walker and may benefit from saddle splint.  Provided her with foam cushion and LUE trough to improve shoulder proximity and positioning.   Further gait training with RW to focus on L attention and safety awareness with RW management - cues for upright and for forward gaze.   Pt reporting urgent need to have BM - due to urgency, used bathroom across from ortho rehab gym.  While on toilet, patient moaning in ?pain and grabbing her lower abdomen. Pt reporting she feels sick and noxious but unable to further explain. She then soon became unresponsive and slumped to her R. Duress button activated and nursing quickly arriving to assist. Dependant transfer back to w/c and  quickly returned to her room with RN elevating BLE and PT supporting head while transporting chair. Dependent transfer back to bed and HOB placed in trendelenburg. Handoff of care from nursing in room. Dan, PA, called to assess.   Discharge Criteria: Patient will be discharged from PT if patient refuses treatment 3 consecutive times without medical reason, if treatment goals not met, if there is a change in medical status, if patient makes no progress towards goals or if patient is discharged from hospital.  The above assessment, treatment plan, treatment alternatives and goals were discussed and mutually agreed upon: by patient  Alger Simons PT, DPT 08/21/2022, 2:23 PM

## 2022-08-21 NOTE — Plan of Care (Signed)
  Problem: RH Balance Goal: LTG Patient will maintain dynamic sitting balance (PT) Description: LTG:  Patient will maintain dynamic sitting balance with assistance during mobility activities (PT) Flowsheets (Taken 08/21/2022 1418) LTG: Pt will maintain dynamic sitting balance during mobility activities with:: Supervision/Verbal cueing Goal: LTG Patient will maintain dynamic standing balance (PT) Description: LTG:  Patient will maintain dynamic standing balance with assistance during mobility activities (PT) Flowsheets (Taken 08/21/2022 1418) LTG: Pt will maintain dynamic standing balance during mobility activities with:: Supervision/Verbal cueing   Problem: Sit to Stand Goal: LTG:  Patient will perform sit to stand with assistance level (PT) Description: LTG:  Patient will perform sit to stand with assistance level (PT) Flowsheets (Taken 08/21/2022 1418) LTG: PT will perform sit to stand in preparation for functional mobility with assistance level: Independent with assistive device   Problem: RH Bed Mobility Goal: LTG Patient will perform bed mobility with assist (PT) Description: LTG: Patient will perform bed mobility with assistance, with/without cues (PT). Flowsheets (Taken 08/21/2022 1418) LTG: Pt will perform bed mobility with assistance level of: Independent with assistive device    Problem: RH Bed to Chair Transfers Goal: LTG Patient will perform bed/chair transfers w/assist (PT) Description: LTG: Patient will perform bed to chair transfers with assistance (PT). Flowsheets (Taken 08/21/2022 1418) LTG: Pt will perform Bed to Chair Transfers with assistance level: Independent with assistive device    Problem: RH Car Transfers Goal: LTG Patient will perform car transfers with assist (PT) Description: LTG: Patient will perform car transfers with assistance (PT). Flowsheets (Taken 08/21/2022 1418) LTG: Pt will perform car transfers with assist:: Supervision/Verbal cueing    Problem: RH Furniture Transfers Goal: LTG Patient will perform furniture transfers w/assist (OT/PT) Description: LTG: Patient will perform furniture transfers  with assistance (OT/PT). Flowsheets (Taken 08/21/2022 1418) LTG: Pt will perform furniture transfers with assist:: Supervision/Verbal cueing   Problem: RH Ambulation Goal: LTG Patient will ambulate in controlled environment (PT) Description: LTG: Patient will ambulate in a controlled environment, # of feet with assistance (PT). Flowsheets (Taken 08/21/2022 1418) LTG: Pt will ambulate in controlled environ  assist needed:: Supervision/Verbal cueing LTG: Ambulation distance in controlled environment: 129f Goal: LTG Patient will ambulate in home environment (PT) Description: LTG: Patient will ambulate in home environment, # of feet with assistance (PT). Flowsheets (Taken 08/21/2022 1418) LTG: Pt will ambulate in home environ  assist needed:: Supervision/Verbal cueing LTG: Ambulation distance in home environment: 538f  Problem: RH Stairs Goal: LTG Patient will ambulate up and down stairs w/assist (PT) Description: LTG: Patient will ambulate up and down # of stairs with assistance (PT) Flowsheets (Taken 08/21/2022 1418) LTG: Pt will ambulate up/down stairs assist needed:: Contact Guard/Touching assist LTG: Pt will  ambulate up and down number of stairs: 4

## 2022-08-22 ENCOUNTER — Inpatient Hospital Stay (HOSPITAL_COMMUNITY): Payer: PPO

## 2022-08-22 ENCOUNTER — Inpatient Hospital Stay (HOSPITAL_COMMUNITY)
Admission: RE | Admit: 2022-08-22 | Discharge: 2022-08-27 | DRG: 064 | Disposition: A | Discharge status: Hospice | Payer: PPO | Source: Ambulatory Visit | Attending: Internal Medicine | Admitting: Internal Medicine

## 2022-08-22 DIAGNOSIS — R4182 Altered mental status, unspecified: Secondary | ICD-10-CM | POA: Diagnosis not present

## 2022-08-22 DIAGNOSIS — Z681 Body mass index (BMI) 19 or less, adult: Secondary | ICD-10-CM | POA: Diagnosis not present

## 2022-08-22 DIAGNOSIS — K219 Gastro-esophageal reflux disease without esophagitis: Secondary | ICD-10-CM | POA: Diagnosis not present

## 2022-08-22 DIAGNOSIS — G40201 Localization-related (focal) (partial) symptomatic epilepsy and epileptic syndromes with complex partial seizures, not intractable, with status epilepticus: Secondary | ICD-10-CM | POA: Diagnosis not present

## 2022-08-22 DIAGNOSIS — Z853 Personal history of malignant neoplasm of breast: Secondary | ICD-10-CM

## 2022-08-22 DIAGNOSIS — Z90710 Acquired absence of both cervix and uterus: Secondary | ICD-10-CM

## 2022-08-22 DIAGNOSIS — Z7901 Long term (current) use of anticoagulants: Secondary | ICD-10-CM | POA: Diagnosis not present

## 2022-08-22 DIAGNOSIS — I6602 Occlusion and stenosis of left middle cerebral artery: Secondary | ICD-10-CM | POA: Diagnosis not present

## 2022-08-22 DIAGNOSIS — R509 Fever, unspecified: Secondary | ICD-10-CM | POA: Diagnosis not present

## 2022-08-22 DIAGNOSIS — E785 Hyperlipidemia, unspecified: Secondary | ICD-10-CM | POA: Diagnosis present

## 2022-08-22 DIAGNOSIS — I69398 Other sequelae of cerebral infarction: Secondary | ICD-10-CM | POA: Diagnosis not present

## 2022-08-22 DIAGNOSIS — J309 Allergic rhinitis, unspecified: Secondary | ICD-10-CM | POA: Diagnosis not present

## 2022-08-22 DIAGNOSIS — R569 Unspecified convulsions: Secondary | ICD-10-CM

## 2022-08-22 DIAGNOSIS — E872 Acidosis, unspecified: Secondary | ICD-10-CM | POA: Diagnosis present

## 2022-08-22 DIAGNOSIS — I214 Non-ST elevation (NSTEMI) myocardial infarction: Secondary | ICD-10-CM | POA: Diagnosis not present

## 2022-08-22 DIAGNOSIS — I69319 Unspecified symptoms and signs involving cognitive functions following cerebral infarction: Secondary | ICD-10-CM | POA: Diagnosis not present

## 2022-08-22 DIAGNOSIS — Z8669 Personal history of other diseases of the nervous system and sense organs: Secondary | ICD-10-CM

## 2022-08-22 DIAGNOSIS — Z923 Personal history of irradiation: Secondary | ICD-10-CM

## 2022-08-22 DIAGNOSIS — E876 Hypokalemia: Secondary | ICD-10-CM | POA: Diagnosis not present

## 2022-08-22 DIAGNOSIS — E538 Deficiency of other specified B group vitamins: Secondary | ICD-10-CM | POA: Diagnosis not present

## 2022-08-22 DIAGNOSIS — R0902 Hypoxemia: Secondary | ICD-10-CM | POA: Diagnosis not present

## 2022-08-22 DIAGNOSIS — Z85828 Personal history of other malignant neoplasm of skin: Secondary | ICD-10-CM

## 2022-08-22 DIAGNOSIS — I471 Supraventricular tachycardia, unspecified: Secondary | ICD-10-CM | POA: Diagnosis not present

## 2022-08-22 DIAGNOSIS — I69320 Aphasia following cerebral infarction: Secondary | ICD-10-CM | POA: Diagnosis not present

## 2022-08-22 DIAGNOSIS — R202 Paresthesia of skin: Secondary | ICD-10-CM | POA: Diagnosis not present

## 2022-08-22 DIAGNOSIS — Z515 Encounter for palliative care: Secondary | ICD-10-CM | POA: Diagnosis not present

## 2022-08-22 DIAGNOSIS — E43 Unspecified severe protein-calorie malnutrition: Secondary | ICD-10-CM | POA: Diagnosis not present

## 2022-08-22 DIAGNOSIS — I6340 Cerebral infarction due to embolism of unspecified cerebral artery: Secondary | ICD-10-CM | POA: Diagnosis not present

## 2022-08-22 DIAGNOSIS — I639 Cerebral infarction, unspecified: Secondary | ICD-10-CM

## 2022-08-22 DIAGNOSIS — I6522 Occlusion and stenosis of left carotid artery: Secondary | ICD-10-CM | POA: Diagnosis not present

## 2022-08-22 DIAGNOSIS — I771 Stricture of artery: Secondary | ICD-10-CM | POA: Diagnosis not present

## 2022-08-22 DIAGNOSIS — G459 Transient cerebral ischemic attack, unspecified: Secondary | ICD-10-CM | POA: Diagnosis not present

## 2022-08-22 DIAGNOSIS — R0689 Other abnormalities of breathing: Secondary | ICD-10-CM | POA: Diagnosis not present

## 2022-08-22 DIAGNOSIS — R29725 NIHSS score 25: Secondary | ICD-10-CM | POA: Diagnosis present

## 2022-08-22 DIAGNOSIS — R7303 Prediabetes: Secondary | ICD-10-CM | POA: Diagnosis not present

## 2022-08-22 DIAGNOSIS — Z7401 Bed confinement status: Secondary | ICD-10-CM | POA: Diagnosis not present

## 2022-08-22 DIAGNOSIS — G9340 Encephalopathy, unspecified: Secondary | ICD-10-CM | POA: Diagnosis not present

## 2022-08-22 DIAGNOSIS — Z9841 Cataract extraction status, right eye: Secondary | ICD-10-CM

## 2022-08-22 DIAGNOSIS — R7401 Elevation of levels of liver transaminase levels: Secondary | ICD-10-CM | POA: Diagnosis not present

## 2022-08-22 DIAGNOSIS — I7300 Raynaud's syndrome without gangrene: Secondary | ICD-10-CM | POA: Diagnosis present

## 2022-08-22 DIAGNOSIS — R404 Transient alteration of awareness: Secondary | ICD-10-CM | POA: Diagnosis not present

## 2022-08-22 DIAGNOSIS — Z803 Family history of malignant neoplasm of breast: Secondary | ICD-10-CM

## 2022-08-22 DIAGNOSIS — G43909 Migraine, unspecified, not intractable, without status migrainosus: Secondary | ICD-10-CM | POA: Diagnosis present

## 2022-08-22 DIAGNOSIS — Z961 Presence of intraocular lens: Secondary | ICD-10-CM | POA: Diagnosis present

## 2022-08-22 DIAGNOSIS — R4701 Aphasia: Secondary | ICD-10-CM

## 2022-08-22 DIAGNOSIS — Z8542 Personal history of malignant neoplasm of other parts of uterus: Secondary | ICD-10-CM | POA: Diagnosis not present

## 2022-08-22 DIAGNOSIS — I63512 Cerebral infarction due to unspecified occlusion or stenosis of left middle cerebral artery: Secondary | ICD-10-CM | POA: Diagnosis not present

## 2022-08-22 DIAGNOSIS — I63511 Cerebral infarction due to unspecified occlusion or stenosis of right middle cerebral artery: Secondary | ICD-10-CM | POA: Diagnosis not present

## 2022-08-22 DIAGNOSIS — Z86000 Personal history of in-situ neoplasm of breast: Secondary | ICD-10-CM | POA: Diagnosis not present

## 2022-08-22 DIAGNOSIS — I10 Essential (primary) hypertension: Secondary | ICD-10-CM | POA: Diagnosis not present

## 2022-08-22 DIAGNOSIS — Z8249 Family history of ischemic heart disease and other diseases of the circulatory system: Secondary | ICD-10-CM

## 2022-08-22 DIAGNOSIS — Z66 Do not resuscitate: Secondary | ICD-10-CM | POA: Diagnosis present

## 2022-08-22 DIAGNOSIS — Z9221 Personal history of antineoplastic chemotherapy: Secondary | ICD-10-CM

## 2022-08-22 DIAGNOSIS — I69354 Hemiplegia and hemiparesis following cerebral infarction affecting left non-dominant side: Secondary | ICD-10-CM | POA: Diagnosis not present

## 2022-08-22 DIAGNOSIS — J479 Bronchiectasis, uncomplicated: Secondary | ICD-10-CM | POA: Diagnosis present

## 2022-08-22 DIAGNOSIS — R233 Spontaneous ecchymoses: Secondary | ICD-10-CM | POA: Diagnosis not present

## 2022-08-22 DIAGNOSIS — Z79899 Other long term (current) drug therapy: Secondary | ICD-10-CM

## 2022-08-22 DIAGNOSIS — I69392 Facial weakness following cerebral infarction: Secondary | ICD-10-CM

## 2022-08-22 DIAGNOSIS — I6381 Other cerebral infarction due to occlusion or stenosis of small artery: Secondary | ICD-10-CM | POA: Diagnosis not present

## 2022-08-22 DIAGNOSIS — Z7982 Long term (current) use of aspirin: Secondary | ICD-10-CM

## 2022-08-22 DIAGNOSIS — D649 Anemia, unspecified: Secondary | ICD-10-CM | POA: Diagnosis not present

## 2022-08-22 DIAGNOSIS — Z9842 Cataract extraction status, left eye: Secondary | ICD-10-CM

## 2022-08-22 DIAGNOSIS — G40901 Epilepsy, unspecified, not intractable, with status epilepticus: Secondary | ICD-10-CM | POA: Diagnosis present

## 2022-08-22 LAB — GLUCOSE, CAPILLARY: Glucose-Capillary: 98 mg/dL (ref 70–99)

## 2022-08-22 MED ORDER — ACETAMINOPHEN 650 MG RE SUPP: 650.0000 mg | RECTAL | Status: DC | PRN

## 2022-08-22 MED ORDER — ASPIRIN 300 MG RE SUPP
300.0000 mg | Freq: Every day | RECTAL | Status: DC
  Administered 2022-08-22 – 2022-08-24 (×3): 300 mg via RECTAL
  Filled 2022-08-22 (×3): qty 1

## 2022-08-22 MED ORDER — LEVETIRACETAM IN NACL 500 MG/100ML IV SOLN
500.0000 mg | Freq: Two times a day (BID) | INTRAVENOUS | Status: DC
  Administered 2022-08-22 – 2022-08-24 (×5): 500 mg via INTRAVENOUS
  Filled 2022-08-22 (×5): qty 100

## 2022-08-22 MED ORDER — SODIUM CHLORIDE 0.9 % IV SOLN
60.0000 mg/kg | Freq: Once | INTRAVENOUS | Status: AC
  Administered 2022-08-22: 2550 mg via INTRAVENOUS
  Filled 2022-08-22: qty 25.5

## 2022-08-22 MED ORDER — SODIUM CHLORIDE 0.9 % IV SOLN: INTRAVENOUS | Status: DC

## 2022-08-22 MED ORDER — ACETAMINOPHEN 325 MG PO TABS: 650.0000 mg | ORAL_TABLET | ORAL | Status: DC | PRN

## 2022-08-22 MED ORDER — LORAZEPAM 2 MG/ML IJ SOLN
INTRAMUSCULAR | Status: AC
  Administered 2022-08-22: 2 mg via INTRAVENOUS
  Filled 2022-08-22: qty 1

## 2022-08-22 MED ORDER — LEVETIRACETAM IN NACL 500 MG/100ML IV SOLN: 500.0000 mg | Freq: Once | INTRAVENOUS | Status: DC

## 2022-08-22 MED ORDER — ASPIRIN 81 MG PO CHEW: 81.0000 mg | CHEWABLE_TABLET | Freq: Every day | ORAL | Status: DC

## 2022-08-22 MED ORDER — STROKE: EARLY STAGES OF RECOVERY BOOK
Freq: Once | Status: AC
  Filled 2022-08-22: qty 1

## 2022-08-22 MED ORDER — LEVETIRACETAM IN NACL 500 MG/100ML IV SOLN: 500.0000 mg | Freq: Two times a day (BID) | INTRAVENOUS | Status: DC

## 2022-08-22 MED ORDER — ACETAMINOPHEN 160 MG/5ML PO SOLN: 650.0000 mg | ORAL | Status: DC | PRN

## 2022-08-22 MED ORDER — LORAZEPAM 2 MG/ML IJ SOLN: 2.0000 mg | INTRAMUSCULAR | Status: DC | PRN

## 2022-08-22 MED ORDER — LORAZEPAM 2 MG/ML IJ SOLN: 2.0000 mg | Freq: Once | INTRAMUSCULAR | Status: AC

## 2022-08-22 MED ORDER — PANTOPRAZOLE SODIUM 40 MG IV SOLR
40.0000 mg | INTRAVENOUS | Status: DC
  Administered 2022-08-22 – 2022-08-23 (×2): 40 mg via INTRAVENOUS
  Filled 2022-08-22 (×2): qty 10

## 2022-08-22 MED ORDER — LORAZEPAM 2 MG/ML IJ SOLN
2.0000 mg | Freq: Once | INTRAMUSCULAR | Status: DC
  Filled 2022-08-22: qty 1

## 2022-08-22 MED ORDER — IOHEXOL 350 MG/ML SOLN
100.0000 mL | Freq: Once | INTRAVENOUS | Status: AC | PRN
  Administered 2022-08-22: 100 mL via INTRAVENOUS

## 2022-08-22 NOTE — Code Documentation (Addendum)
Stroke Response Nurse Documentation Code Documentation  Elizabeth Friedman is a 81 y.o. female who was on 4W-18. She has a  past medical hx of previous CVA (10/13), HLD, B12 deficiency, breast and uterine cancer, stage 1 uterine carcinosarcoma. On Eliquis (apixaban) daily. Code stroke was activated by Rapid response RN  Patient from 4W-18 rehabilitation where she was LKW at [facility] and now complaining of left sided gaze, aphasia, and worsening left sided deficits.   Patient to CT with team. NIHSS 24, see documentation for details and code stroke times. Patient with disoriented, not following commands, left gaze preference , bilateral hemianopia, left facial droop, left arm weakness, left leg weakness, right decreased sensation, and Global aphasia  on exam. The following imaging was completed:  CT Head, CTA, and CTP. Patient is not a candidate for IV Thrombolytic due to her being on apixiban. Patient is not a candidate for IR due to her high risk of hemorrhage. Delay in treatment decision due to extensive conversation with IR MD about the complexity of the case as well as family member discussion about treatment plan  Care Plan: q2 VS/neuro checks x 12 hours, then q4h.   Bedside handoff with ED RN Mickel Baas.    [place]  Stroke Response RN

## 2022-08-22 NOTE — Progress Notes (Signed)
Physical Therapy Discharge Note  Patient Details  Name: Elizabeth Friedman MRN: [id number] Date of Birth: May 28, 1941   This patient was unable to complete the inpatient rehab program due to unplanned DC to ED; therefore did not meet their long term goals. Pt left the program at a total assist level for their functional mobility/ transfers. This patient is being discharged from PT services at this time.    Pt's perception of pain in the last five days was unable to answer at this time.     See CareTool for functional status details    If the patient is able to return to inpatient rehabilitation within 3 midnights, this may be considered an interrupted stay and therapy services will resume as ordered. Modification and reinstatement of their goals will be made upon completion of therapy service reevaluations.        Therapy/Group: Individual Therapy  Lygia Olaes 08/22/2022, 7:58 AM

## 2022-08-22 NOTE — Progress Notes (Signed)
PT Cancellation Note  Patient Details Name: Elizabeth Friedman MRN: [id number] DOB: Nov 20, 1940   Cancelled Treatment:    Reason Eval/Treat Not Completed: Fatigue/lethargy limiting ability to participate - will check back tomorrow per request of RN.   Stacie Glaze, PT DPT Acute Rehabilitation Services Pager [phone]  Office [phone]    [place] 08/22/2022, 12:00 PM

## 2022-08-22 NOTE — Progress Notes (Signed)
OT Cancellation Note  Patient Details Name: KENYATTA GLOECKNER MRN: [id number] DOB: May 11, 1941   Cancelled Treatment:    Reason Eval/Treat Not Completed: Fatigue/lethargy limiting ability to participate;Other (comment) (RN reporting recent ativan, and pt unable to participate due to lethargy.)  Shanda Howells, OTR/L [facility] Acute Rehabilitation Office: [phone]   Lula Olszewski 08/22/2022, 3:07 PM

## 2022-08-22 NOTE — Progress Notes (Signed)
No documented urinary output noted today.  Bladder scan showed 733m at 2048.  Orders received from Dr. RMarlowe Saxwho is on call for hospitalist team for I+O cath prn bladder scan >3038m. StAyesha MohairSN RN CMPrudhoe Bay0/28/2023, 10:17 PM

## 2022-08-22 NOTE — Procedures (Incomplete)
Patient Name: Elizabeth Friedman  MRN: [id number]  Epilepsy Attending: Lora Havens  Referring Physician/Provider: Derek Jack, MD  Duration: 08/22/2022 1007 to 08/23/2022 1007   Patient history: 81 y.o. female  with medical hx of previous CVA (10/13), HLD, B12 deficiency, breast and uterine cancer, stage 1 uterine carcinosarcoma. On Eliquis (apixaban) daily. Now complaining of left sided gaze, aphasia, and worsening left sided deficits. EEEG to evaluate for seizure   Level of alertness: awake, asleep    AEDs during EEG study: LEV   Technical aspects: This EEG study was done with scalp electrodes positioned according to the 10-20 International system of electrode placement. Electrical activity was reviewed with band pass filter of 1-'70Hz'$ , sensitivity of 7 uV/mm, display speed of 59m/sec with a '60Hz'$  notched filter applied as appropriate. EEG data were recorded continuously and digitally stored.  Video monitoring was available and reviewed as appropriate.   Description: No clear posterior dominant rhythm was seen. Sleep was characterized by sleep spindles (12-14 Hz), maximal fronto-central region. EEG showed continuous generalized and lateralized right hemisphere 3 to 6 Hz theta-delta slowing admixed with 13-'15Hz'$  frontocentral region. Sharp transients were noted in left frontal region. Hyperventilation and photic stimulation were not performed.      ABNORMALITY - Continuous slow, generalized and lateralized right hemisphere   IMPRESSION: This study is suggestive of cortical dysfunction in right hemisphere likely secondary to underlying stroke. Additionally there is severe diffuse encephalopathy, nonspecific etiology. No seizures or epileptiform discharges were seen throughout the recording.     Marlie Kuennen OBarbra Sarks

## 2022-08-22 NOTE — Progress Notes (Signed)
Inpatient Rehabilitation Discharge Medication Review by a Pharmacist  A complete drug regimen review was completed for this patient to identify any potential clinically significant medication issues.  High Risk Drug Classes Is patient taking? Indication by Medication  Antipsychotic No   Anticoagulant Yes Eliquis - stroke ppx  Antibiotic Yes Azithromycin - bronchiectasis (x 3 mo per 9/23 PCCM note)  Opioid No   Antiplatelet Yes ASA - CAD, stroke ppx  Hypoglycemics/insulin No   Vasoactive Medication Yes Amlodipine - HTN  Chemotherapy No   Other Yes Atorvastatin - HLD Topamax - migraine ppx Trazodone - sleep Gabapentin - pain Protonix - GERD Ca, Vit D, Vit B12, Vit E, iron, Vit C - suppl     Type of Medication Issue Identified Description of Issue Recommendation(s)  Drug Interaction(s) (clinically significant)     Duplicate Therapy     Allergy     No Medication Administration End Date     Incorrect Dose     Additional Drug Therapy Needed     Significant med changes from prior encounter (inform family/care partners about these prior to discharge).    Other       Clinically significant medication issues were identified that warrant physician communication and completion of prescribed/recommended actions by midnight of the next day:  No   Time spent performing this drug regimen review (minutes):  [place], PharmD, [facility] PGY1 Pharmacy Resident 08/22/2022 10:26 AM

## 2022-08-22 NOTE — H&P (Signed)
History and Physical    Patient: Elizabeth Friedman FGH:[id number] DOB: 1941-08-06 DOA: 08/22/2022 DOS: the patient was seen and examined on 08/22/2022 PCP: Adin Hector, MD  Patient coming from: Transfer from rehab  Chief Complaint:   HPI: Elizabeth Friedman is a 81 y.o. female with medical history significant of recurrent CVA on Eliquis, dyslipidemia, GERD, osteoarthritis, vitamin B12 deficiency, breast and uterine cancer Patient had just recently been hospitalized at [facility] from 10/19-10/26 with early subacute infarct involving the posterior right frontal parietal region, left cerebellum, and left temporal occipital region confirmed on MRI.  There was concern for hypercoagulability in the setting malignancy due to recurrent strokes on aspirin and Plavix.  TEE was negative.  After discussions with oncology Plavix was discontinued, aspirin continued, and heparin drip transitioned to Eliquis p.o.  Patient had been sent to inpatient rehab for further treatment.  Code stroke was called this morning after patient was noted to have left gaze deviation and aphasia.  She had been reported to be in her normal state of health around 5:30 AM when she went to the bathroom.  During code stroke patient developed rhythmic jumping of the right upper and lower extremity concerning for seizure was aborted after receiving Ativan 4 mg IV.  She was loaded with Keppra 60 mg/kg and remained aphasic after seizure has stopped.  Labs from 10/27 noted hemoglobin 10.9, potassium 3.4, BUN 12, and creatinine 1.03.  CT scan of the head noted low attenuation of the frontal parietal lobe concerning for acute nonhemorrhagic MCA territory infarct with repeat scan CT scan of the head this morning noted acute posterior left MCA territory infarct affecting M3 segment.patient was not a tPA candidate due to being on anticoagulation.  CTA head and neck revealed emergent left MCA branch occlusion with positive emergent left MCA branch occlusion very  proximal to left M3 segment of the dominant posterior left M2 division.  She was not candidate for LVO due to stroke being near completion.  Review of Systems: unable to review all systems due to the inability of the patient to answer questions. Past Medical History:  Diagnosis Date   Allergic rhinitis    Anemia    Anginal pain (HCC)    Arthritis    Atypical chest pain    Breast cancer ([place])    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[place])    Ductal carcinoma in situ (DCIS) of right breast    Fibrocystic breast disease    GERD (gastroesophageal reflux disease)    Hearing aid worn    bilateral   History of hiatal hernia    Hyperlipidemia    Lung nodule    Menopause    Migraine    Migraine    optical migraines   Motion sickness    Plantar fasciitis    Prolapse of female pelvic organs    hodge pessary   Raynaud's disease    Sciatic leg pain    Sciatica    Skin cancer of face    Stroke [facility])    Urinary retention with incomplete bladder emptying    Uterine carcinosarcoma ([place]) 11/2021   Vitreous detachment of both eyes    Past Surgical History:  Procedure Laterality Date   APPENDECTOMY  1951   BREAST LUMPECTOMY WITH RADIOACTIVE SEED LOCALIZATION Right 05/29/2020   Procedure: RIGHT BREAST LUMPECTOMY WITH RADIOACTIVE SEED LOCALIZATION;  Surgeon: Rolm Bookbinder, MD;  Location: [place];  Service: General;  Laterality: Right;   CARDIAC CATHETERIZATION  2004   negative  CATARACT EXTRACTION W/PHACO Right 02/26/2021   Procedure: CATARACT EXTRACTION PHACO AND INTRAOCULAR LENS PLACEMENT (IOC) RIGHT 5.86 01:19.6 7.4%;  Surgeon: Leandrew Koyanagi, MD;  Location: [place];  Service: Ophthalmology;  Laterality: Right;   CATARACT EXTRACTION W/PHACO Left 09/03/2021   Procedure: CATARACT EXTRACTION PHACO AND INTRAOCULAR LENS PLACEMENT ([place]) LEFT;  Surgeon: Leandrew Koyanagi, MD;  Location: [place];  Service: Ophthalmology;  Laterality: Left;  9.61 01:15.9    COLONOSCOPY     2000, 2006, 2012   COLONOSCOPY WITH PROPOFOL N/A 03/27/2016   Procedure: COLONOSCOPY WITH PROPOFOL;  Surgeon: Manya Silvas, MD;  Location: [facility] ENDOSCOPY;  Service: Endoscopy;  Laterality: N/A;   COLONOSCOPY WITH PROPOFOL N/A 08/14/2020   Procedure: COLONOSCOPY WITH PROPOFOL;  Surgeon: Toledo, Benay Pike, MD;  Location: [facility] ENDOSCOPY;  Service: Gastroenterology;  Laterality: N/A;   CYSTOSCOPY N/A 12/24/2021   Procedure: CYSTOSCOPY;  Surgeon: Jaquita Folds, MD;  Location: [facility] ORS;  Service: Gynecology;  Laterality: N/A;   DILATATION & CURETTAGE/HYSTEROSCOPY WITH MYOSURE N/A 12/01/2021   Procedure: DILATATION & CURETTAGE/HYSTEROSCOPY WITH MYOSURE;  Surgeon: Rubie Maid, MD;  Location: [facility] ORS;  Service: Gynecology;  Laterality: N/A;   ESOPHAGOGASTRODUODENOSCOPY  08/1999   ESOPHAGOGASTRODUODENOSCOPY (EGD) WITH PROPOFOL N/A 08/14/2020   Procedure: ESOPHAGOGASTRODUODENOSCOPY (EGD) WITH PROPOFOL;  Surgeon: Toledo, Benay Pike, MD;  Location: [facility] ENDOSCOPY;  Service: Gastroenterology;  Laterality: N/A;   ESOPHAGOGASTRODUODENOSCOPY (EGD) WITH PROPOFOL N/A 03/21/2021   Procedure: ESOPHAGOGASTRODUODENOSCOPY (EGD) WITH PROPOFOL;  Surgeon: Toledo, Benay Pike, MD;  Location: [facility] ENDOSCOPY;  Service: Gastroenterology;  Laterality: N/A;   PORTA CATH INSERTION N/A 01/15/2022   Procedure: PORTA CATH INSERTION;  Surgeon: Algernon Huxley, MD;  Location: [place] CV LAB;  Service: Cardiovascular;  Laterality: N/A;   PORTA CATH REMOVAL N/A 06/15/2022   Procedure: PORTA CATH REMOVAL;  Surgeon: Algernon Huxley, MD;  Location: [place] CV LAB;  Service: Cardiovascular;  Laterality: N/A;   SKIN CANCER EXCISION     face and neck   TEE WITHOUT CARDIOVERSION N/A 08/17/2022   Procedure: TRANSESOPHAGEAL ECHOCARDIOGRAM (TEE);  Surgeon: Dionisio David, MD;  Location: [facility] ORS;  Service: Cardiovascular;  Laterality: N/A;   TOTAL LAPAROSCOPIC HYSTERECTOMY WITH BILATERAL SALPINGO OOPHORECTOMY  Bilateral 12/24/2021   Procedure: TOTAL LAPAROSCOPIC HYSTERECTOMY WITH BILATERAL SALPINGO OOPHORECTOMY, PELVIC NODE BIOPSY,  PESSARY REMOVAL (PESSARY REMOVAL BY DR. Wannetta Sender;  Surgeon: Mellody Drown, MD;  Location: [facility] ORS;  Service: Gynecology;  Laterality: Bilateral;   Social History:  reports that she has never smoked. She has never used smokeless tobacco. She reports that she does not drink alcohol and does not use drugs.  No Known Allergies  Family History  Problem Relation Age of Onset   Breast cancer Mother    Heart disease Father    Colon cancer Brother    Diabetes Neg Hx    Ovarian cancer Neg Hx     Prior to Admission medications   Medication Sig Start Date End Date Taking? Authorizing Provider  acetaminophen (TYLENOL) 500 MG tablet Take 1,000 mg by mouth every 6 (six) hours as needed for moderate pain or mild pain.    [provider]  alum & mag hydroxide-simeth (MAALOX/MYLANTA) 200-200-20 MG/5ML suspension Take 30 mLs by mouth every 6 (six) hours as needed for indigestion or heartburn. Patient not taking: Reported on 08/05/2022    [provider]  amLODipine (NORVASC) 5 MG tablet Take 1 tablet (5 mg total) by mouth daily. 08/21/22   Val Riles, MD  apixaban Arne [place]) 5  MG TABS tablet Take 1 tablet (5 mg total) by mouth 2 (two) times daily. 08/20/22   Val Riles, MD  ascorbic acid (VITAMIN C) 250 MG tablet Take 1 tablet (250 mg total) by mouth daily. 08/11/22   Emeterio Reeve, DO  aspirin 81 MG chewable tablet Chew 1 tablet (81 mg total) by mouth daily. 08/11/22   Emeterio Reeve, DO  atorvastatin (LIPITOR) 40 MG tablet Take 1 tablet (40 mg total) by mouth daily. 08/11/22   Emeterio Reeve, DO  azithromycin (ZITHROMAX) 500 MG tablet Take 1 tablet (500 mg total) by mouth 3 (three) times a week. 08/21/22 11/19/22  Val Riles, MD  Calcium-Vitamin D (CALTRATE 600 PLUS-VIT D PO) Take 1 tablet by mouth in the morning.    [provider]   Carboxymethylcellulose Sod PF 1 % GEL Place 1 drop into both eyes at bedtime.    [provider]  Cholecalciferol (VITAMIN D) 125 MCG (5000 UT) CAPS Take 5,000 Units by mouth in the morning.    [provider]  Coenzyme Q10 200 MG TABS Take 200 mg by mouth in the morning.    [provider]  Cyanocobalamin (VITAMIN B-12 PO) Take 1 tablet by mouth in the morning.    [provider]  Ferrous Fumarate (HEMOCYTE - 106 MG FE) 324 (106 Fe) MG TABS tablet Take 1 tablet (106 mg of iron total) by mouth daily. 08/11/22   Emeterio Reeve, DO  gabapentin (NEURONTIN) 400 MG capsule Take 1 capsule (400 mg total) by mouth 2 (two) times daily. 08/10/22   Emeterio Reeve, DO  magnesium hydroxide (MILK OF MAGNESIA) 400 MG/5ML suspension Take by mouth daily as needed for mild constipation.    [provider]  pantoprazole (PROTONIX) 40 MG tablet Take 40 mg by mouth in the morning.    [provider]  RESTASIS 0.05 % ophthalmic emulsion Place 1 drop into both eyes 2 (two) times daily. 10/24/15   [provider]  topiramate (TOPAMAX) 50 MG tablet Take 50 mg by mouth every evening. 12/22/19   [provider]  Triamcinolone Acetonide (NASACORT ALLERGY 24HR NA) Place 1 spray into the nose daily as needed (sinus congestion).    [provider]  vitamin E 180 MG (400 UNITS) capsule Take 400 Units by mouth in the morning.    [provider]    Physical Exam: Vitals:   08/22/22 0705  BP: (!) 120/57  Pulse: 75  Resp: 12   Constitutional: Lethargic elderly female currently not able to follow commands Eyes: PERRL, lids and conjunctivae normal ENMT: Mucous membranes are dry. Neck: normal, supple, no masses  Respiratory: clear to auscultation bilaterally, no wheezing, no crackles. Normal respiratory effort.   Cardiovascular: Regular rate and rhythm, no murmurs / rubs / gallops. No extremity edema. 2+ pedal pulses. No carotid  bruits.  Abdomen: no tenderness, no masses palpated.  Bowel sounds positive.  Musculoskeletal: no clubbing / cyanosis. No joint deformity upper and lower extremities.   Skin: no rashes, lesions, ulcers.   Neurologic:   Withdraws to pain in all 4 extremities. Psychiatric: Unresponsive at this time.  Data Reviewed:  EKG reveals sinus rhythm at 73 bpm.  Reviewed labs, imaging, and pertinent records as noted above in the HPI.  Assessment and Plan: Left MCA stroke Acute.  Patient noted to have left gaze preference with aphasia.  CT persistence of cancer.  Acute posterior left MCA territory infarct affecting the M3 segment with emergent left MCA branch occlusion.  Patient was not a candidate for thrombolytics due to anticoagulation and poor IR candidate.  -Admit to a progressive bed -Stroke order set utilized -Neurochecks -N.p.o. until able to pass swallow screen -Check MRI brain -PT/OT/speech to evaluate and treat -Aspirin suppository daily until tolerating p.o. -Appreciate neurology consultative services, will follow-up for any further recommendations  Seizures   Acute.  Patient had been multiple seizures with status epilepticus thought secondary to these recurrent strokes. She had been given Ativan 4 mg IV and loaded with 60 mg/kg Keppra. -Seizure precautions -Keppra 500 mg IV twice daily -Ativan IV as needed seizure activity -Continuous EEG  History of endometrial and breast cancer Patient with history of endometrial mass s/p removal 20 years ago.  Patient then had right breast DCIS status postlumpectomy with radiation treatment in 2021.  Later patient underwent total laparoscopic hysterectomy with bilateral salpingo oophorectomy 12/24/2021 for which pathology report revealed carcinosarcoma.  She had been treated with chemotherapy and radiation, but family notes most recent blood noted persistent elevated CA-125 in the 40s. -Consider discussing with oncology in regards to anticoagulation  if able or warranted to be resumed -Consider need of palliative care consult if patient does not appear to be improving  Dyslipidemia -Continue Lipitor when able  Bronchiectasis She was supposed to be on azithromycin for chronic bronchiectasis for 3 months/months from Dr. Lanney Gins note 06/2022.  This had been resumed following discharge 10/26. -Consider resuming azithromycin when able  History of migraine headaches -Topamax on hold  GERD -Continue Protonix IV  Severe protein calorie malnutrition BMI 17.14  kg/m     Advance Care Planning:   Code Status: DNR confirmed with family present at bedside Consults: Neurology  Family Communication: Daughter, son, and daughter-in-law updated at bedside Severity of Illness: The appropriate patient status for this patient is INPATIENT. Inpatient status is judged to be reasonable and necessary in order to provide the required intensity of service to ensure the patient's safety. The patient's presenting symptoms, physical exam findings, and initial radiographic and laboratory data in the context of their chronic comorbidities is felt to place them at high risk for further clinical deterioration. Furthermore, it is not anticipated that the patient will be medically stable for discharge from the hospital within 2 midnights of admission.   * I certify that at the point of admission it is my clinical judgment that the patient will require inpatient hospital care spanning beyond 2 midnights from the point of admission due to high intensity of service, high risk for further deterioration and high frequency of surveillance required.*  Author: Norval Morton, MD 08/22/2022 9:52 AM  For on call review [email or web address].

## 2022-08-22 NOTE — Progress Notes (Addendum)
Called by nursing earlier this am about a code stroke.  Pt down in CT, called by rapid response RN Saralyn Pilar who stated CT brain showed extension of Right MCA infarct, pt will be transferred to 3W progressive, tcalledson Richardson Landry Daily, who already spoke to Dr Quinn Axe Dr Su Monks will be attending neurologist

## 2022-08-22 NOTE — Progress Notes (Signed)
SLP Cancellation Note  Patient Details Name: Elizabeth Friedman MRN: [id number] DOB: January 24, 1941   Cancelled treatment:        Attempted to see pt for swallowing assessment.  Per RN pt with recent administration of ativan is too lethargic to participate in evaluation at this time.  SLP to follow for readiness for PO trials.   Celedonio Savage, [place], [place] Office: [phone] 08/22/2022, 3:57 PM

## 2022-08-22 NOTE — Progress Notes (Signed)
Occupational Therapy Discharge Note  This patient was unable to complete the inpatient rehab program due to transfer to acute care; therefore did not meet their long term goals. Pt left the program at a Mod assist level for their  functional ADLs. This patient is being discharged from OT services at this time.  BIMS at time of d/c  Pt unable to complete due to medical status  See CareTool for functional status details.  If the patient is able to return to inpatient rehabilitation within 3 midnights, this may be considered an interrupted stay and therapy services will resume as ordered. Modification and reinstatement of their goals will be made upon completion of therapy service reevaluations.

## 2022-08-22 NOTE — Procedures (Signed)
Patient Name: Elizabeth Friedman  MRN: [id number]  Epilepsy Attending: Lora Havens  Referring Physician/Provider: Derek Jack, MD  Date: 08/22/2022 Duration: 23.41 mins  Patient history: 81 y.o. female  with medical hx of previous CVA (10/13), HLD, B12 deficiency, breast and uterine cancer, stage 1 uterine carcinosarcoma. On Eliquis (apixaban) daily. Now complaining of left sided gaze, aphasia, and worsening left sided deficits. EEEG to evaluate for seizure  Level of alertness:  lethargic   AEDs during EEG study: None  Technical aspects: This EEG study was done with scalp electrodes positioned according to the 10-20 International system of electrode placement. Electrical activity was reviewed with band pass filter of 1-'70Hz'$ , sensitivity of 7 uV/mm, display speed of 54m/sec with a '60Hz'$  notched filter applied as appropriate. EEG data were recorded continuously and digitally stored.  Video monitoring was available and reviewed as appropriate.  Description: EEG showed continuous generalized 3 to 6 Hz theta-delta slowing admixed with 13-'15Hz'$  frontocentral region. Hyperventilation and photic stimulation were not performed.     ABNORMALITY - Continuous slow, generalized  IMPRESSION: This study is suggestive of severe diffuse encephalopathy, nonspecific etiology. No seizures or epileptiform discharges were seen throughout the recording.   Naol Ontiveros OBarbra Sarks

## 2022-08-22 NOTE — Progress Notes (Signed)
LTM EEG hooked up and running - no initial skin breakdown - push button tested - neuro notified. [facility] monitoring.  

## 2022-08-22 NOTE — Progress Notes (Signed)
Resident became unresponsive with a left gaze and aphasic. Notified charge nurse and rapid was called followed up with code stroke. Dr. Read Drivers notified by charge nurse. Resident transported to procedure area for further evaluation and intervention.

## 2022-08-22 NOTE — Progress Notes (Signed)
EEG complete - results pending 

## 2022-08-22 NOTE — Progress Notes (Signed)
   08/22/22 0713  Neurological  Neuro (WDL) X  Orientation Level Disoriented to person;Disoriented to place;Disoriented to time;Disoriented to situation  Cognition Poor attention/concentration  Speech Mute  R Pupil Size (mm) 2  R Pupil Shape Round  R Pupil Reaction Sluggish  L Pupil Size (mm) 2  L Pupil Shape Round  L Pupil Reaction Sluggish  Facial Symmetry Asymmetrical left  R Hand Grip Absent  L Hand Grip Absent  R Foot Dorsiflexion Absent  L Foot Dorsiflexion Absent  R Foot Plantar Flexion Absent  L Foot Plantar Flexion Absent  RUE Motor Response Non-purposeful movement  RUE Sensation Full sensation  RUE Motor Strength 2  LUE Motor Response Non-purposeful movement  LUE Sensation Decreased  LUE Motor Strength 2  RLE Motor Response Non-purposeful movement  RLE Sensation Full sensation  RLE Motor Strength 2  LLE Motor Response Non-purposeful movement  LLE Sensation Decreased  LLE Motor Strength 2  Neuro Symptoms Other (Comment)  Glasgow Coma Scale  Eye Opening 1  Best Verbal Response (NON-intubated) 1  Best Motor Response 3  Glasgow Coma Scale Score 5  Neurological  Level of Consciousness Unresponsive

## 2022-08-22 NOTE — Consult Note (Signed)
NEUROLOGY CONSULTATION NOTE   Date of service: August 22, 2022 Patient Name: Elizabeth Friedman MRN:  [id number] DOB:  August 27, 1941 Reason for consult: stroke code Requesting physician: Dr. Alysia Penna _ _ _   _ __   _ __ _ _  __ __   _ __   __ _  History of Present Illness   81 yo woman with hx breast cancer, carcinosarcoma of uterus, R breast, hyperlipidemia, migraine admitted for embolic strokes in multiple vascular territories with the largest in the right MCA.  She was discharged to rehab.  Stroke code was called this morning after patient had acute onset of left gaze deviation and aphasia.  LKW 0530 when patient was walked to the bathroom. She was unable to follow commands and was unable to speak intelligibly.  During the stroke code she had rhythmic jerking of her right upper and lower extremity consistent with seizure.  This aborted after 4 mg of IV Ativan.  She was also loaded on Keppra 60 mg/kg.  She remained aphasic after the seizure stopped.  She is on Eliquis. NIHSS = 25. Patient was not a TNK candidate 2/2 therapeutic anticoagulation.  CT head 1. Acute posterior Left MCA territory infarct affecting the M3 segment. ASPECTS 9. 2. No malignant hemorrhagic transformation or intracranial mass effect. But evidence of petechial hemorrhage in the recent Right MCA infarct.  CTA H&N 1. Positive for emergent Left MCA branch occlusion, very proximal Left M3 segment of the dominant posterior left M2 division. 2. However, CTP estimates a larger infarct core (40 mL) relative to the ASPECTS of 9. And conspicuous Tmax>10s in the posterior Left MCA territory suggests whatever core is present might be rapidly expanding.  CTP showed 50cc mismatch however the ischemia calculated was in both the L and R MCA territories and included the region of recent stroke. The region of ischemia in the vessel fed by the L M3 nearly matched the region of core in that area.  As such patient was felt to be a poor  candidate for IR due to the fact that there was felt to be very little brain to salvage in that vascular territory and the increased risk of hemorrhage both as a result of this and as a result of her being on Eliquis. The case was discussed with Dr. Estanislado Pandy of neuro IR and also son Deland Pretty by phone who were both in agreement not to proceed with mechanical thrombectomy.  STAT EEG showed no nonconvulsive seizure activity and will be f/b LTM   ROS   UTA 2/2 mental status  Past History   I have reviewed the following:  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HCC)    Ductal carcinoma in situ (DCIS) of right breast    Fibrocystic breast disease    GERD (gastroesophageal reflux disease)    Hearing aid worn    bilateral   History of hiatal hernia    Hyperlipidemia    Lung nodule    Menopause    Migraine    Migraine    optical migraines   Motion sickness    Plantar fasciitis    Prolapse of female pelvic organs    hodge pessary   Raynaud's disease    Sciatic leg pain    Sciatica  Skin cancer of face    Stroke [facility])    Urinary retention with incomplete bladder emptying    Uterine carcinosarcoma ([place]) 11/2021   Vitreous detachment of both eyes    Past Surgical History:  Procedure Laterality Date   APPENDECTOMY  1951   BREAST LUMPECTOMY WITH RADIOACTIVE SEED LOCALIZATION Right 05/29/2020   Procedure: RIGHT BREAST LUMPECTOMY WITH RADIOACTIVE SEED LOCALIZATION;  Surgeon: Rolm Bookbinder, MD;  Location: [place];  Service: General;  Laterality: Right;   CARDIAC CATHETERIZATION  2004   negative   CATARACT EXTRACTION W/PHACO Right 02/26/2021   Procedure: CATARACT EXTRACTION PHACO AND INTRAOCULAR LENS PLACEMENT ([facility]) RIGHT 5.86 01:19.6 7.4%;  Surgeon: Leandrew Koyanagi, MD;  Location: [place];  Service:  Ophthalmology;  Laterality: Right;   CATARACT EXTRACTION W/PHACO Left 09/03/2021   Procedure: CATARACT EXTRACTION PHACO AND INTRAOCULAR LENS PLACEMENT ([place]) LEFT;  Surgeon: Leandrew Koyanagi, MD;  Location: [place];  Service: Ophthalmology;  Laterality: Left;  9.61 01:15.9   COLONOSCOPY     2000, 2006, 2012   COLONOSCOPY WITH PROPOFOL N/A 03/27/2016   Procedure: COLONOSCOPY WITH PROPOFOL;  Surgeon: Manya Silvas, MD;  Location: [facility] ENDOSCOPY;  Service: Endoscopy;  Laterality: N/A;   COLONOSCOPY WITH PROPOFOL N/A 08/14/2020   Procedure: COLONOSCOPY WITH PROPOFOL;  Surgeon: Toledo, Benay Pike, MD;  Location: [facility] ENDOSCOPY;  Service: Gastroenterology;  Laterality: N/A;   CYSTOSCOPY N/A 12/24/2021   Procedure: CYSTOSCOPY;  Surgeon: Jaquita Folds, MD;  Location: [facility] ORS;  Service: Gynecology;  Laterality: N/A;   DILATATION & CURETTAGE/HYSTEROSCOPY WITH MYOSURE N/A 12/01/2021   Procedure: DILATATION & CURETTAGE/HYSTEROSCOPY WITH MYOSURE;  Surgeon: Rubie Maid, MD;  Location: [facility] ORS;  Service: Gynecology;  Laterality: N/A;   ESOPHAGOGASTRODUODENOSCOPY  08/1999   ESOPHAGOGASTRODUODENOSCOPY (EGD) WITH PROPOFOL N/A 08/14/2020   Procedure: ESOPHAGOGASTRODUODENOSCOPY (EGD) WITH PROPOFOL;  Surgeon: Toledo, Benay Pike, MD;  Location: [facility] ENDOSCOPY;  Service: Gastroenterology;  Laterality: N/A;   ESOPHAGOGASTRODUODENOSCOPY (EGD) WITH PROPOFOL N/A 03/21/2021   Procedure: ESOPHAGOGASTRODUODENOSCOPY (EGD) WITH PROPOFOL;  Surgeon: Toledo, Benay Pike, MD;  Location: [facility] ENDOSCOPY;  Service: Gastroenterology;  Laterality: N/A;   PORTA CATH INSERTION N/A 01/15/2022   Procedure: PORTA CATH INSERTION;  Surgeon: Algernon Huxley, MD;  Location: [place] CV LAB;  Service: Cardiovascular;  Laterality: N/A;   PORTA CATH REMOVAL N/A 06/15/2022   Procedure: PORTA CATH REMOVAL;  Surgeon: Algernon Huxley, MD;  Location: [place] CV LAB;  Service: Cardiovascular;  Laterality: N/A;   SKIN CANCER  EXCISION     face and neck   TEE WITHOUT CARDIOVERSION N/A 08/17/2022   Procedure: TRANSESOPHAGEAL ECHOCARDIOGRAM (TEE);  Surgeon: Dionisio David, MD;  Location: [facility] ORS;  Service: Cardiovascular;  Laterality: N/A;   TOTAL LAPAROSCOPIC HYSTERECTOMY WITH BILATERAL SALPINGO OOPHORECTOMY Bilateral 12/24/2021   Procedure: TOTAL LAPAROSCOPIC HYSTERECTOMY WITH BILATERAL SALPINGO OOPHORECTOMY, PELVIC NODE BIOPSY,  PESSARY REMOVAL (PESSARY REMOVAL BY DR. Wannetta Sender;  Surgeon: Mellody Drown, MD;  Location: [facility] ORS;  Service: Gynecology;  Laterality: Bilateral;   Family History  Problem Relation Age of Onset   Breast cancer Mother    Heart disease Father    Colon cancer Brother    Diabetes Neg Hx    Ovarian cancer Neg Hx    Social History   Socioeconomic History   Marital status: Widowed    Spouse name: Not on file   Number of children: Not on file   Years of education: Not on file   Highest education level: Not on file  Occupational History   Not on file  Tobacco Use   Smoking status: Never   Smokeless tobacco: Never  Vaping Use   Vaping Use: Never used  Substance and Sexual Activity   Alcohol use: No   Drug use: No   Sexual activity: Not Currently    Birth control/protection: Post-menopausal  Other Topics Concern   Not on file  Social History Narrative   Lives alone   Social Determinants of Health   Financial Resource Strain: Not on file  Food Insecurity: No Food Insecurity (08/14/2022)   Hunger Vital Sign    Worried About Running Out of Food in the Last Year: Never true    Ran Out of Food in the Last Year: Never true  Transportation Needs: No Transportation Needs (08/14/2022)   PRAPARE - Hydrologist (Medical): No    Lack of Transportation (Non-Medical): No  Physical Activity: Sufficiently Active (12/21/2017)   Exercise Vital Sign    Days of Exercise per Week: 3 days    Minutes of Exercise per Session: 60 min  Stress: Not on file  Social  Connections: Not on file   No Known Allergies  Medications   Medications Prior to Admission  Medication Sig Dispense Refill Last Dose   acetaminophen (TYLENOL) 500 MG tablet Take 1,000 mg by mouth every 6 (six) hours as needed for moderate pain or mild pain.      alum & mag hydroxide-simeth (MAALOX/MYLANTA) 200-200-20 MG/5ML suspension Take 30 mLs by mouth every 6 (six) hours as needed for indigestion or heartburn. (Patient not taking: Reported on 08/05/2022)      amLODipine (NORVASC) 5 MG tablet Take 1 tablet (5 mg total) by mouth daily.      apixaban (ELIQUIS) 5 MG TABS tablet Take 1 tablet (5 mg total) by mouth 2 (two) times daily. 60 tablet     ascorbic acid (VITAMIN C) 250 MG tablet Take 1 tablet (250 mg total) by mouth daily. 30 tablet 0    aspirin 81 MG chewable tablet Chew 1 tablet (81 mg total) by mouth daily. 30 tablet 0    atorvastatin (LIPITOR) 40 MG tablet Take 1 tablet (40 mg total) by mouth daily. 30 tablet 0    azithromycin (ZITHROMAX) 500 MG tablet Take 1 tablet (500 mg total) by mouth 3 (three) times a week. 36 tablet 0    Calcium-Vitamin D (CALTRATE 600 PLUS-VIT D PO) Take 1 tablet by mouth in the morning.      Carboxymethylcellulose Sod PF 1 % GEL Place 1 drop into both eyes at bedtime.      Cholecalciferol (VITAMIN D) 125 MCG (5000 UT) CAPS Take 5,000 Units by mouth in the morning.      Coenzyme Q10 200 MG TABS Take 200 mg by mouth in the morning.      Cyanocobalamin (VITAMIN B-12 PO) Take 1 tablet by mouth in the morning.      Ferrous Fumarate (HEMOCYTE - 106 MG FE) 324 (106 Fe) MG TABS tablet Take 1 tablet (106 mg of iron total) by mouth daily. 30 tablet 0    gabapentin (NEURONTIN) 400 MG capsule Take 1 capsule (400 mg total) by mouth 2 (two) times daily.      magnesium hydroxide (MILK OF MAGNESIA) 400 MG/5ML suspension Take by mouth daily as needed for mild constipation.      pantoprazole (PROTONIX) 40 MG tablet Take 40 mg by mouth in the morning.      RESTASIS 0.05 %  ophthalmic emulsion Place 1 drop into both eyes 2 (two) times daily.      topiramate (TOPAMAX) 50 MG tablet Take 50 mg by mouth every evening.      Triamcinolone Acetonide (NASACORT ALLERGY 24HR NA) Place 1 spray into the nose daily as needed (sinus congestion).      vitamin E 180 MG (400 UNITS) capsule Take 400 Units by mouth in the morning.        No current facility-administered medications for this encounter.  Vitals   Vitals:   08/22/22 0705  BP: (!) 120/57  Pulse: 75  Resp: 12     There is no height or weight on file to calculate BMI.  Physical Exam   Physical Exam Gen: alert, unable to answer questions or follow commands Resp: Normal WOB CV: RRR  Neuro: *MS: alert, unable to answer questions or follow commands *Speech: no intelligible speech *CN: PERRL, does not blink to threat bilat, L forced gaze deviation, sensation intact, L facial droop *Motor and sensory: drift to bed in all extrem, more pronounced on L; withdraws to pain in all 4 extrem but less briskly on L *Reflexes: 2+ more brisk on R  During the stroke code patient had focal seizure involving rhythmic jerking RUE and RLE which was aborted with '4mg'$  IV ativan   NIHSS  1a Level of Conscious.: 0 1b LOC Questions: 2 1c LOC Commands: 2 2 Best Gaze: 2 3 Visual: 3 4 Facial Palsy: 2 5a Motor Arm - left: 2 5b Motor Arm - Right: 2 6a Motor Leg - Left: 2 6b Motor Leg - Right: 2 7 Limb Ataxia: 0 8 Sensory: 1 9 Best Language: 3 10 Dysarthria: 2 11 Extinct. and Inatten.: 0  TOTAL: 25   Premorbid mRS = 1   Labs   CBC:  Recent Labs  Lab 08/18/22 0406 08/19/22 0443 08/20/22 0441 08/21/22 0728  WBC 5.4   < > 5.4 5.6  NEUTROABS 3.4  --   --  4.0  HGB 9.6*   < > 9.8* 10.9*  HCT 28.2*   < > 28.9* 32.0*  MCV 96.9   < > 97.3 98.2  PLT 129*   < > 140* 157   < > = values in this interval not displayed.    Basic Metabolic Panel:  Lab Results  Component Value Date   NA 138 08/21/2022   K 3.4 (L)  08/21/2022   CO2 23 08/21/2022   GLUCOSE 129 (H) 08/21/2022   BUN 12 08/21/2022   CREATININE 1.03 (H) 08/21/2022   CALCIUM 9.7 08/21/2022   GFRNONAA 54 (L) 08/21/2022   GFRAA 60 (L) 05/22/2020   Lipid Panel:  Lab Results  Component Value Date   LDLCALC 86 08/14/2022   HgbA1c:  Lab Results  Component Value Date   HGBA1C 5.7 (H) 08/08/2022   Urine Drug Screen: No results found for: "LABOPIA", "COCAINSCRNUR", "LABBENZ", "AMPHETMU", "THCU", "LABBARB"  Alcohol Level     Component Value Date/Time   ETH <10 08/07/2022 1458     Impression   81 yo woman with hx breast cancer, carcinosarcoma of uterus, R breast, hyperlipidemia, migraine admitted for embolic strokes in multiple vascular territories with the largest in the right MCA then discharged in rehab. Stroke code was called this morning after patient had acute onset of left gaze deviation and aphasia.  LKW 0530 when patient was walked to the bathroom. She was unable to follow commands and was unable to speak intelligibly.  During the stroke  code she had rhythmic jerking of her right upper and lower extremity consistent with seizure.  This aborted after 4 mg of IV Ativan.  She was also loaded on Keppra 60 mg/kg.  She remained aphasic after the seizure stopped.  She is on Eliquis. NIHSS = 25. Patient was not a TNK candidate 2/2 therapeutic anticoagulation. CT head showed acute L MCA infarct. LVO present but stroke completed therefore pt deemed inappropriate candidate for IR. STAT EEG showed no nonconvulsive seizure activity and will be f/b LTM  Recommendations   # Acute ischemic L MCA stroke - Transfer to progressive 3W under hospitalist service - BP goal 130-160 in the setting of acute ischemic stroke and petechial hemorrhage recent R MCA stroke, on eliquis until this AM. PRN labetalol or hydralazine if BP above these parameters. Avoid oral antihypertensives. - MRI brain wo contrast - Hold eliquis in the setting of acute ischemic  stroke; bridge with ASA '81mg'$  daily - Leave NPO for now - q4 hr neuro checks - STAT head CT for any change in neuro exam - Tele - PT/OT/SLP - Stroke education - Amb referral to neurology upon discharge   # Focal seizure w/ status epilepticus - Loaded w/ '60mg'$ /kg keppra, continue '500mg'$  bid - cEEG w/ MRI compatible leads - '2mg'$  IV ativan prn for sz - Seizure precautions  Will continue to follow. Family updated.  ______________________________________________________________________   Thank you for the opportunity to take part in the care of this patient. If you have any further questions, please contact the neurology consultation attending.  Signed,  Su Monks, MD [facility] [phone]  If 7pm- 7am, please page neurology on call as listed in [place].  **Any copied and pasted documentation in this note was written by me in another application not billed for and pasted by me into this document.

## 2022-08-22 NOTE — Plan of Care (Signed)
CODE STROKE; extension R MCA CVA; Discharged to acute services

## 2022-08-22 NOTE — Progress Notes (Signed)
Pt is in the middle of a transfer. Try back in 30 mins to 3w

## 2022-08-23 ENCOUNTER — Inpatient Hospital Stay (HOSPITAL_COMMUNITY): Payer: PPO

## 2022-08-23 DIAGNOSIS — G40901 Epilepsy, unspecified, not intractable, with status epilepticus: Secondary | ICD-10-CM

## 2022-08-23 DIAGNOSIS — I639 Cerebral infarction, unspecified: Secondary | ICD-10-CM | POA: Diagnosis not present

## 2022-08-23 DIAGNOSIS — R569 Unspecified convulsions: Secondary | ICD-10-CM | POA: Diagnosis not present

## 2022-08-23 MED ORDER — ATORVASTATIN CALCIUM 40 MG PO TABS: 40.0000 mg | ORAL_TABLET | Freq: Every day | ORAL | Status: DC

## 2022-08-23 MED ORDER — METOPROLOL TARTRATE 5 MG/5ML IV SOLN: 2.5000 mg | Freq: Four times a day (QID) | INTRAVENOUS | Status: DC | PRN

## 2022-08-23 NOTE — Procedures (Signed)
Patient Name: Elizabeth Friedman  MRN: [id number]  Epilepsy Attending: Lora Havens  Referring Physician/Provider: Derek Jack, MD  Duration: 08/23/2022 1007 to 08/23/2022 1255   Patient history: 81 y.o. female  with medical hx of previous CVA (10/13), HLD, B12 deficiency, breast and uterine cancer, stage 1 uterine carcinosarcoma. On Eliquis (apixaban) daily. Now complaining of left sided gaze, aphasia, and worsening left sided deficits. EEEG to evaluate for seizure   Level of alertness: awake, asleep    AEDs during EEG study: LEV   Technical aspects: This EEG study was done with scalp electrodes positioned according to the 10-20 International system of electrode placement. Electrical activity was reviewed with band pass filter of 1-'70Hz'$ , sensitivity of 7 uV/mm, display speed of 37m/sec with a '60Hz'$  notched filter applied as appropriate. EEG data were recorded continuously and digitally stored.  Video monitoring was available and reviewed as appropriate.   Description: No clear posterior dominant rhythm was seen. Sleep was characterized by sleep spindles (12-14 Hz), maximal fronto-central region. EEG showed continuous generalized and lateralized right hemisphere 3 to 6 Hz theta-delta slowing admixed with 13-'15Hz'$  frontocentral region. Sharp transients were noted in left frontal region. Hyperventilation and photic stimulation were not performed.      ABNORMALITY - Continuous slow, generalized and lateralized right hemisphere   IMPRESSION: This study is suggestive of cortical dysfunction in right hemisphere likely secondary to underlying stroke. Additionally there is severe diffuse encephalopathy, nonspecific etiology. No seizures or epileptiform discharges were seen throughout the recording.     Correy Weidner OBarbra Sarks

## 2022-08-23 NOTE — Progress Notes (Addendum)
STROKE TEAM PROGRESS NOTE   INTERVAL HISTORY Her son is at the bedside.  Patient has a history of stroke (here from 10/19-10/29 was at [facility]) and according to her son, she had right gaze deviation and aphasia. Today she was too sedated to engage on assessment.  Vitals:   08/22/22 2354 08/23/22 0410 08/23/22 0738 08/23/22 1247  BP: 133/82 135/86 (!) 145/67 (!) 143/80  Pulse: (!) 102 80 79 99  Resp: '19 17 17 17  '$ Temp: 98.5 F (36.9 C) 98.5 F (36.9 C) 98.4 F (36.9 C) 99.1 F (37.3 C)  TempSrc: Oral Oral Oral Oral  SpO2: 99% 96% 90% 93%   CBC:  Recent Labs  Lab 08/18/22 0406 08/19/22 0443 08/20/22 0441 08/21/22 0728  WBC 5.4   < > 5.4 5.6  NEUTROABS 3.4  --   --  4.0  HGB 9.6*   < > 9.8* 10.9*  HCT 28.2*   < > 28.9* 32.0*  MCV 96.9   < > 97.3 98.2  PLT 129*   < > 140* 157   < > = values in this interval not displayed.   Basic Metabolic Panel:  Recent Labs  Lab 08/19/22 0443 08/21/22 0728  NA 138 138  K 3.6 3.4*  CL 109 107  CO2 24 23  GLUCOSE 98 129*  BUN 11 12  CREATININE 0.76 1.03*  CALCIUM 9.1 9.7   Lipid Panel: LDL 86 HgbA1c: 5.7 Urine Drug Screen: No results for input(s): "LABOPIA", "COCAINSCRNUR", "LABBENZ", "AMPHETMU", "THCU", "LABBARB" in the last 168 hours.  Alcohol Level No results for input(s): "ETH" in the last 168 hours.  IMAGING past 24 hours Overnight EEG with video  Result Date: 08/22/2022 Lora Havens, MD     08/23/2022  7:47 AM Patient Name: Elizabeth Friedman MRN: [id number] Epilepsy Attending: Lora Havens Referring Physician/Provider: Derek Jack, MD Duration: 08/22/2022 1007 to 08/23/2022 0730  Patient history: 81 y.o. female  with medical hx of previous CVA (10/13), HLD, B12 deficiency, breast and uterine cancer, stage 1 uterine carcinosarcoma. On Eliquis (apixaban) daily. Now complaining of left sided gaze, aphasia, and worsening left sided deficits. EEEG to evaluate for seizure  Level of alertness: awake, asleep  AEDs during EEG  study: LEV  Technical aspects: This EEG study was done with scalp electrodes positioned according to the 10-20 International system of electrode placement. Electrical activity was reviewed with band pass filter of 1-'70Hz'$ , sensitivity of 7 uV/mm, display speed of 65m/sec with a '60Hz'$  notched filter applied as appropriate. EEG data were recorded continuously and digitally stored.  Video monitoring was available and reviewed as appropriate.  Description: No clear posterior dominant rhythm was seen. Sleep was characterized by sleep spindles (12-14 Hz), maximal fronto-central region. EEG showed continuous generalized and lateralized right hemisphere 3 to 6 Hz theta-delta slowing admixed with 13-'15Hz'$  frontocentral region. Sharp transients were noted in left frontal region. Hyperventilation and photic stimulation were not performed.    ABNORMALITY - Continuous slow, generalized and lateralized right hemisphere  IMPRESSION: This study is suggestive of cortical dysfunction in right hemisphere likely secondary to underlying stroke. Additionally there is severe diffuse encephalopathy, nonspecific etiology. No seizures or epileptiform discharges were seen throughout the recording.   [place]    PHYSICAL EXAM General: Pleasant, well-appearing  in bed. No acute distress. Skin: Warm and dry. No obvious rash or lesions. Neuro: exam limited as she was sedated and not responding to stimuli. Pupils are equal round and reactive.  Aggressive  assessment was not completed today as she was sedated and resting peacefully Psych: Normal mood and affect   ASSESSMENT/PLAN 81 yo woman with hx breast cancer, carcinosarcoma of uterus, R breast, hyperlipidemia, migraine admitted for embolic strokes in multiple vascular territories with the largest in the right MCA.  Stroke:  left MCA Etiology:  large vessel occlusion  CT head Acute posterior Left MCA territory infarct affecting the M3 segment. ASPECTS 9. CTA head & neck  Positive for emergent Left MCA branch occlusion, very proximal Left M3 segment of the dominant posterior left M2 division. CT perfusion CTP estimates a larger infarct core (40 mL) relative to the ASPECTS of 9. And conspicuous Tmax>10s in the posterior Left MCA territory suggests whatever core is present might be rapidly expanding. MRI  pending 2D Echo EF 65-70%, no atrial shunt present EEG severe encephalopathy LDL 86 HgbA1c 5.7 VTE prophylaxis - none    Diet   Diet NPO time specified   aspirin 81 mg daily and Eliquis (apixaban) daily prior to admission, now on aspirin 81 mg daily. If MRI shows no hemorrhagic bleed, will start xarelto   Therapy recommendations:  pending Disposition:  pending  Hypertension Home meds:  norvasc Stable Permissive hypertension (OK if < 220/120) but gradually normalize in 5-7 days Long-term BP goal normotensive  Hyperlipidemia Home meds:  lipitor 40 mg, resumed in hospital LDL 86, goal < 70 Continue statin at discharge  Prediabetes Home meds:  none HgbA1c 5.7, goal < 7.0 CBGs Recent Labs    08/22/22 0652  GLUCAP 98     Other Stroke Risk Factors Advanced Age >/= 75  Hx stroke/TIA Migraines   Other Active Problems  Seizure Received Ativan 4 mg IV Loaded with Keppra 60 mg/kg  Hospital day # 1  Coralyn Pear, MD PGY-1 Psychiatry   ATTENDING ATTESTATION:  Dr. Reeves Forth evaluated pt independently, reviewed imaging, chart, labs. Discussed and formulated plan with the Resident/APP. Changes were made to the note where appropriate. Please see APP/resident note above for details.    81 year old with history of breast cancer uterine cancer admitted at [facility] for multiple embolic strokes cancer in the right MCA area and transferred to rehab at [facility] where she had a code stroke called with acute onset left gaze deviation and inability to speak.  She had rhythmic shaking of the right side suspicious for a seizure.  Loaded with Keppra 60  mg/kg.  She is been on LTM overnight with no further activity.  Leads were removed for MRI today.  She was on Eliquis prior to her code stroke currently being held and bridged with aspirin until MRI is completed discussed with patient's son we will likely start Xarelto.  Discussed the possibility of Coumadin however the son did not like the idea of further blood sticks given everything she has been through.  He understands she is at high risk for recurrent strokes and risk of bleeding on anticoagulation going forward.   Lorilee Cafarella,MD   To contact Stroke Continuity provider, please refer to [email or web address] After hours, contact General Neurology

## 2022-08-23 NOTE — Progress Notes (Signed)
Inpatient Rehab Admissions Coordinator:   Per therapy recommendations, patient was screened for CIR candidacy by Clemens Catholic, MS, CCC-SLP Pt. Is a recent d/c from CIR due to new stroke; however, at this time, Pt. is not yet at a level to tolerate the intensity of CIR.   Pt. may have potential to progress to becoming a potential CIR candidate, so CIR admissions team will follow and monitor for progress and participation with therapies and place consult order if Pt. appears to be an appropriate candidate. Please contact me with any questions.   Clemens Catholic, [employer], [facility] Admissions Coordinator  [phone] ([place]) [phone] (office)

## 2022-08-23 NOTE — Discharge Summary (Signed)
Physician Discharge Summary  Patient ID: INEZE SERRAO MRN: [id number] DOB/AGE: Dec 07, 1940 81 y.o.  Admit date: 08/22/2022 Discharge date: 08/22/2022  Discharge Diagnoses:  Principal Problem:   CVA (cerebral vascular accident) [facility]) Active Problems:   History of ductal carcinoma in situ (DCIS) of breast   Bronchiectasis without complication ([place])   Dyslipidemia   GERD without esophagitis   Seizure ([place])   History of endometrial cancer   History of migraine headaches   Protein-calorie malnutrition, severe ([place])   Discharged Condition: Guarded  Significant Diagnostic Studies: Overnight EEG with video  Result Date: 08/22/2022 Lora Havens, MD     08/23/2022  7:47 AM Patient Name: GAYLEEN SHOLTZ MRN: [id number] Epilepsy Attending: Lora Havens Referring Physician/Provider: Derek Jack, MD Duration: 08/22/2022 1007 to 08/23/2022 0730  Patient history: 81 y.o. female  with medical hx of previous CVA (10/13), HLD, B12 deficiency, breast and uterine cancer, stage 1 uterine carcinosarcoma. On Eliquis (apixaban) daily. Now complaining of left sided gaze, aphasia, and worsening left sided deficits. EEEG to evaluate for seizure  Level of alertness: awake, asleep  AEDs during EEG study: LEV  Technical aspects: This EEG study was done with scalp electrodes positioned according to the 10-20 International system of electrode placement. Electrical activity was reviewed with band pass filter of 1-'70Hz'$ , sensitivity of 7 uV/mm, display speed of 57m/sec with a '60Hz'$  notched filter applied as appropriate. EEG data were recorded continuously and digitally stored.  Video monitoring was available and reviewed as appropriate.  Description: No clear posterior dominant rhythm was seen. Sleep was characterized by sleep spindles (12-14 Hz), maximal fronto-central region. EEG showed continuous generalized and lateralized right hemisphere 3 to 6 Hz theta-delta slowing admixed with 13-'15Hz'$  frontocentral region.  Sharp transients were noted in left frontal region. Hyperventilation and photic stimulation were not performed.    ABNORMALITY - Continuous slow, generalized and lateralized right hemisphere  IMPRESSION: This study is suggestive of cortical dysfunction in right hemisphere likely secondary to underlying stroke. Additionally there is severe diffuse encephalopathy, nonspecific etiology. No seizures or epileptiform discharges were seen throughout the recording.   PLora Havens   EEG adult  Result Date: 08/22/2022 YLora Havens MD     08/22/2022 10:51 AM Patient Name: EIMOGEAN CIAMPAMRN: [id number] Attending: PLora HavensReferring Physician/Provider: SDerek Jack MD Date: 08/22/2022 Duration: 23.41 mins Patient history: 81y.o. female  with medical hx of previous CVA (10/13), HLD, B12 deficiency, breast and uterine cancer, stage 1 uterine carcinosarcoma. On Eliquis (apixaban) daily. Now complaining of left sided gaze, aphasia, and worsening left sided deficits. EEEG to evaluate for seizure Level of alertness:  lethargic AEDs during EEG study: None Technical aspects: This EEG study was done with scalp electrodes positioned according to the 10-20 International system of electrode placement. Electrical activity was reviewed with band pass filter of 1-'70Hz'$ , sensitivity of 7 uV/mm, display speed of 31msec with a '60Hz'$  notched filter applied as appropriate. EEG data were recorded continuously and digitally stored.  Video monitoring was available and reviewed as appropriate. Description: EEG showed continuous generalized 3 to 6 Hz theta-delta slowing admixed with 13-'15Hz'$  frontocentral region. Hyperventilation and photic stimulation were not performed.   ABNORMALITY - Continuous slow, generalized IMPRESSION: This study is suggestive of severe diffuse encephalopathy, nonspecific etiology. No seizures or epileptiform discharges were seen throughout the recording. Priyanka O Barbra Sarks CT ANGIO HEAD  NECK W WO CM W PERF (CODE STROKE)  Result Date: 08/22/2022 CLINICAL  DATA:  81 year old female with evidence of acute left MCA infarct on plain head CT. Recent right MCA infarct. EXAM: CT ANGIOGRAPHY HEAD AND NECK CT PERFUSION BRAIN TECHNIQUE: Multidetector CT imaging of the head and neck was performed using the standard protocol during bolus administration of intravenous contrast. Multiplanar CT image reconstructions and MIPs were obtained to evaluate the vascular anatomy. Carotid stenosis measurements (when applicable) are obtained utilizing NASCET criteria, using the distal internal carotid diameter as the denominator. Multiphase CT imaging of the brain was performed following IV bolus contrast injection. Subsequent parametric perfusion maps were calculated using RAPID software. RADIATION DOSE REDUCTION: This exam was performed according to the departmental dose-optimization program which includes automated exposure control, adjustment of the mA and/or kV according to patient size and/or use of iterative reconstruction technique. COMPARISON:  Plain head CT today 0727 hours. Recent CTA head and neck 08/08/2022. FINDINGS: CT Brain Perfusion Findings: ASPECTS: 9 CBF (<30%) Volume: 40 mLmL, posterior left MCA territory Perfusion (Tmax>6.0s) volume: 73m, although this includes some oligemia in the contralateral right hemisphere. Hypoperfusion index 0.3, but there appears to be substantial T-max greater than 10 S in the posterior left MCA territory. Mismatch Volume: Less than 515m and perhaps a very little mismatch given the above. Infarction Location:Acute is in the left MCA CTA NECK Skeleton: Stable.  Cervical facet degeneration. Upper chest: Stable apical lung scarring. Stable visible superior mediastinum. Other neck: Mild retained secretions in the pharynx, otherwise stable. Aortic arch: Stable Calcified aortic atherosclerosis. Mildly bovine arch configuration. Right carotid system: Stable. No significant  stenosis despite some plaque. Left carotid system: Stable.  Mild for age plaque and no stenosis. Vertebral arteries: Stable. Proximal left subclavian artery plaque without significant stenosis. Dominant left vertebral artery. Late entry of the right vertebral artery into the cervical transverse foramen. No significant stenosis to the skull base. CTA HEAD Posterior circulation: Stable. No posterior circulation large vessel occlusion or significant stenosis. Anterior circulation: Both ICA siphons are stable and patent. Mild plaque and no significant stenosis. Bilateral ACAs and right MCAs are stable from the recent CTA. Left MCA M1 segment and bifurcation remain patent. There is an acute left MCA branch occlusion, which is the dominant posterior M2 branch although the occlusion occurs just distal to a branch takeoff as seen on series 13, image 25, and is technically at the proximal M3 level. Other left MCA branches appear stable. Venous sinuses: Early contrast timing, grossly patent. Anatomic variants: Unchanged from description on prior CTA. Review of the MIP images confirms the above findings Preliminary result of left MCA branch occlusion were communicated to Dr. COSu Monksn 08/22/2022 at 0746 hours and then discussed by telephone at 0747 hours. IMPRESSION: 1. Positive for emergent Left MCA branch occlusion, very proximal Left M3 segment of the dominant posterior left M2 division. 2. However, CTP estimates a larger infarct core (40 mL) relative to the ASPECTS of 9. And conspicuous Tmax>10s in the posterior Left MCA territory suggests whatever core is present might be rapidly expanding. 3. The above was discussed by telephone with Dr. COSu Monksn 08/22/2022 at 0756 hours. 4. Otherwise stable CTA head and neck since 08/08/2022. Electronically Signed   By: H Genevie Ann.D.   On: 08/22/2022 08:02   CT HEAD CODE STROKE WO CONTRAST  Result Date: 08/22/2022 CLINICAL DATA:  Code stroke. [age]ear old female code  stroke presentation with leftward gaze which is new. Recent right MCA infarct. EXAM: CT HEAD WITHOUT CONTRAST TECHNIQUE: Contiguous axial images were obtained from  the base of the skull through the vertex without intravenous contrast. RADIATION DOSE REDUCTION: This exam was performed according to the departmental dose-optimization program which includes automated exposure control, adjustment of the mA and/or kV according to patient size and/or use of iterative reconstruction technique. COMPARISON:  None Available. FINDINGS: Brain: Of all vein right MCA territory infarct primarily along the sylvian fissure with mild petechial hemorrhage on series 2, image 19, perhaps slightly increased in conspicuity from yesterday but no malignant hemorrhagic transformation or mass effect. Chronic lacunar infarct left caudate nucleus. Subtle loss of gray-white matter differentiation in the posterior left MCA territory most apparent on images 14 and 15 affecting the posterior temporal lobe, left M3 segment. Other gray-white matter differentiation stable, including small cerebellar infarcts. No midline shift or ventriculomegaly. Normal basilar cisterns. Vascular: Stable. Skull: No acute osseous abnormality identified. Sinuses/Orbits: Visualized paranasal sinuses and mastoids are stable and well aerated. Other: Stable orbit and scalp soft tissues. ASPECTS [facility] Stroke Program Early CT Score) Total score (0-10 with 10 being normal): 9, abnormal left M3 segment. Study discussed by telephone with Dr. Quinn Axe on 08/22/2022 at 07:35 . IMPRESSION: 1. Acute posterior Left MCA territory infarct affecting the M3 segment. ASPECTS 9. 2. No malignant hemorrhagic transformation or intracranial mass effect. But evidence of petechial hemorrhage in the recent Right MCA infarct. 3. Study discussed by telephone with Dr. Concepcion Living on 08/22/2022 at 07:35 . Electronically Signed   By: Genevie Ann M.D.   On: 08/22/2022 07:39   CT HEAD WO CONTRAST  (5MM)  Result Date: 08/21/2022 CLINICAL DATA:  Altered mental status EXAM: CT HEAD WITHOUT CONTRAST TECHNIQUE: Contiguous axial images were obtained from the base of the skull through the vertex without intravenous contrast. RADIATION DOSE REDUCTION: This exam was performed according to the departmental dose-optimization program which includes automated exposure control, adjustment of the mA and/or kV according to patient size and/or use of iterative reconstruction technique. COMPARISON:  08/16/2022 FINDINGS: Brain: Low-attenuation of the frontoparietal lobe concerning for an acute MCA territory infarct. No evidence of hemorrhage, hydrocephalus, extra-axial collection or mass lesion/mass effect. Vascular: No hyperdense vessel or unexpected calcification. Skull: No osseous abnormality. Sinuses/Orbits: Visualized paranasal sinuses are clear. Visualized mastoid sinuses are clear. Visualized orbits demonstrate no focal abnormality. Other: None IMPRESSION: 1. Low-attenuation of the frontoparietal lobe concerning for an acute non-hemorrhagic MCA territory infarct. Electronically Signed   By: Kathreen Devoid M.D.   On: 08/21/2022 15:25   CT Angio Chest Pulmonary Embolism (PE) W or WO Contrast  Result Date: 08/17/2022 CLINICAL DATA:  Pulmonary embolism (PE) suspected, high prob EXAM: CT ANGIOGRAPHY CHEST WITH CONTRAST TECHNIQUE: Multidetector CT imaging of the chest was performed using the standard protocol during bolus administration of intravenous contrast. Multiplanar CT image reconstructions and MIPs were obtained to evaluate the vascular anatomy. RADIATION DOSE REDUCTION: This exam was performed according to the departmental dose-optimization program which includes automated exposure control, adjustment of the mA and/or kV according to patient size and/or use of iterative reconstruction technique. CONTRAST:  20m OMNIPAQUE IOHEXOL 350 MG/ML SOLN COMPARISON:  Chest XR, 08/08/2022. CT chest, 08/07/2022. NM V/Q scan,  08/08/2022 FINDINGS: Cardiovascular: Satisfactory opacification of the pulmonary arteries to the segmental level. No evidence of pulmonary embolism. Normal heart size. No pericardial effusion. Mediastinum/Nodes: No enlarged mediastinal, hilar, or axillary lymph nodes. Thyroid gland, trachea, and esophagus demonstrate no significant findings. Lungs/Pleura: No focal consolidation or mass. Similar distribution and appearance of tree-in-bud nodularities, greatest within the RIGHT lung base. LEFT basilar calcified granuloma.  No pleural effusion or pneumothorax. Upper Abdomen: No acute abnormality. Punctate calcified granulomas within liver and spleen, likely sequelae of prior granulomatous disease. Musculoskeletal: No chest wall abnormality. No acute or significant osseous findings. Review of the MIP images confirms the above findings. IMPRESSION: 1.  No segmental or larger pulmonary embolus. 2. Similar distribution and appearance of tree-in-bud nodularities, greatest within the RIGHT lung base. Findings are likely to reflect an infectious or inflammatory process. 3. Punctate calcified granulomas within chest and abdomen, likely sequelae of prior granulomatous disease. Electronically Signed   By: Michaelle Birks M.D.   On: 08/17/2022 16:42   MR CERVICAL SPINE W WO CONTRAST  Result Date: 08/17/2022 CLINICAL DATA:  Ataxia.  Left-sided weakness.  Stroke. EXAM: MRI CERVICAL SPINE WITHOUT AND WITH CONTRAST TECHNIQUE: Multiplanar and multiecho pulse sequences of the cervical spine, to include the craniocervical junction and cervicothoracic junction, were obtained without and with intravenous contrast. CONTRAST:  43m GADAVIST GADOBUTROL 1 MMOL/ML IV SOLN COMPARISON:  None Available. FINDINGS: Alignment: Normal Vertebrae: No fracture or focal lesion. Cord: No cord insult.  No cord compression. Posterior Fossa, vertebral arteries, paraspinal tissues: See results of brain MRI. Disc levels: The foramen magnum is widely patent.  There is ordinary mild osteoarthritis of the C1-2 articulation but no encroachment upon the neural structures. C2-3: Facet osteoarthritis left more than right. No compressive stenosis. C3-4: Bulging of the disc. Facet osteoarthritis left more than right. Moderate left foraminal narrowing. C4-5: Spondylosis with endplate osteophytes and bulging of the disc. No cord compression. Moderate bilateral foraminal narrowing. C5-6: Spondylosis with endplate osteophytes and bulging of the disc. No cord compression. Moderate bilateral foraminal narrowing. C6-7: Small right paracentral disc protrusion. No neural compression. C7-T1: Mild facet osteoarthritis.  No stenosis. IMPRESSION: 1. No cord insult. 2. Degenerative spondylosis from C3-4 through C5-6. No cord compression. Foraminal narrowing that could possibly be symptomatic on the left at C3-4 and bilateral at C4-5 and C5-6. Electronically Signed   By: MNelson ChimesM.D.   On: 08/17/2022 14:13   MR BRAIN W WO CONTRAST  Result Date: 08/17/2022 CLINICAL DATA:  Follow-up stroke.  Right-sided weakness. EXAM: MRI HEAD WITHOUT AND WITH CONTRAST TECHNIQUE: Multiplanar, multiecho pulse sequences of the brain and surrounding structures were obtained without and with intravenous contrast. CONTRAST:  519mGADAVIST GADOBUTROL 1 MMOL/ML IV SOLN COMPARISON:  Head CT yesterday.  MRI 08/13/2022. FINDINGS: Brain: Small areas of acute infarction previously seen in the left cerebellum are normalizing on diffusion imaging. No new cerebellar or brainstem insult. There has been extension of the region of infarction in the right hemisphere, now seen affecting the deep insula and more of the frontoparietal cortical and subcortical brain. This is consistent with progressive right MCA branch vessel infarction. Newly seen subcentimeter focus of acute infarction in the left frontal white matter. Normalization of diffusion at the location of punctate acute infarctions previously seen in the left  posterior temporal lobe. No evidence of hemorrhage or mass effect. Elsewhere, chronic ischemic changes of the pons, thalami, basal ganglia and hemispheric white matter are stable. No hydrocephalus. No extra-axial collection. After contrast administration, some foci of enhancement are notable in the regions of infarction in the left cerebellum and right frontoparietal region. Vascular: Major vessels at the base of the brain show flow. Skull and upper cervical spine: Negative Sinuses/Orbits: Clear/normal Other: None IMPRESSION: 1. Interval extension of infarction in the right hemisphere, now seen affecting the deep insula and more of the frontoparietal cortical and subcortical brain. This is consistent  with progressive right MCA branch vessel infarction. No evidence of hemorrhage or mass effect. 2. Newly seen subcentimeter focus of acute infarction in the left frontal white matter. 3. Some normalization of diffusion at the location of acute infarctions previously seen in the left posterior temporal lobe and left cerebellum. 4. Chronic small-vessel ischemic changes elsewhere throughout the brain as outlined above. Electronically Signed   By: Nelson Chimes M.D.   On: 08/17/2022 14:10   ECHO TEE  Result Date: 08/17/2022    TRANSESOPHOGEAL ECHO REPORT   Patient Name:   TAHIRIH LAIR Date of Exam: 08/17/2022 Medical Rec #:  [id number]    Height:       62.0 in Accession #:    [id number]   Weight:       102.0 lb Date of Birth:  10-12-1941   BSA:          1.436 m Patient Age:    48 years     BP:           140/75 mmHg Patient Gender: F            HR:           72 bpm. Exam Location:  [facility] Procedure: Transesophageal Echo, Cardiac Doppler, Color Doppler and Saline            Contrast Bubble Study Indications:     Acute CVA  History:         Patient has prior history of Echocardiogram examinations, most                  recent 08/10/2022. Stroke. Atypical chest pain , Breast cancer.  Sonographer:     Sherrie Sport Referring Phys:   [id number] A [facility]O Diagnosing Phys: Neoma Laming PROCEDURE: The transesophogeal probe was passed without difficulty through the esophogus of the patient. Sedation performed by performing physician. The patient developed no complications during the procedure. IMPRESSIONS  1. Left ventricular ejection fraction, by estimation, is 65 to 70%. The left ventricle has normal function.  2. Right ventricular systolic function is normal. The right ventricular size is normal.  3. Left atrial size was mildly dilated. No left atrial/left atrial appendage thrombus was detected.  4. Right atrial size was mildly dilated.  5. The mitral valve is grossly normal. Trivial mitral valve regurgitation.  6. The aortic valve is calcified. Aortic valve regurgitation is trivial.  7. Agitated saline contrast bubble study was negative, with no evidence of any interatrial shunt. Conclusion(s)/Recommendation(s): No LA/LAA thrombus identified. Negative bubble study for interatrial shunt. No intracardiac source of embolism detected on this on this transesophageal echocardiogram. FINDINGS  Left Ventricle: Left ventricular ejection fraction, by estimation, is 65 to 70%. The left ventricle has normal function. The left ventricular internal cavity size was normal in size. Right Ventricle: The right ventricular size is normal. No increase in right ventricular wall thickness. Right ventricular systolic function is normal. Left Atrium: Left atrial size was mildly dilated. No left atrial/left atrial appendage thrombus was detected. Right Atrium: Right atrial size was mildly dilated. Pericardium: There is no evidence of pericardial effusion. Mitral Valve: The mitral valve is grossly normal. Trivial mitral valve regurgitation. Tricuspid Valve: The tricuspid valve is grossly normal. Tricuspid valve regurgitation is mild. Aortic Valve: The aortic valve is calcified. Aortic valve regurgitation is trivial. Pulmonic Valve: The pulmonic valve was normal in  structure. Pulmonic valve regurgitation is trivial. Aorta: The aortic root, ascending aorta and aortic arch are all structurally normal,  with no evidence of dilitation or obstruction. IAS/Shunts: No atrial level shunt detected by color flow Doppler. Agitated saline contrast was given intravenously to evaluate for intracardiac shunting. Agitated saline contrast bubble study was negative, with no evidence of any interatrial shunt. Neoma Laming Electronically signed by Neoma Laming Signature Date/Time: 08/17/2022/9:33:10 AM    Final    CT HEAD WO CONTRAST (5MM)  Result Date: 08/16/2022 CLINICAL DATA:  Neuro deficit, acute, stroke suspected. Severe headache earlier, now better. EXAM: CT HEAD WITHOUT CONTRAST TECHNIQUE: Contiguous axial images were obtained from the base of the skull through the vertex without intravenous contrast. RADIATION DOSE REDUCTION: This exam was performed according to the departmental dose-optimization program which includes automated exposure control, adjustment of the mA and/or kV according to patient size and/or use of iterative reconstruction technique. COMPARISON:  Head CT dated 08/13/2022.  Brain MRI dated 08/13/2022. FINDINGS: Brain: Mild generalized age related parenchymal atrophy with commensurate dilatation of the ventricles and sulci. Ventricles are stable in size and configuration in the short-term interval. Ill-defined area of low-density within the posterior RIGHT frontal lobe, corresponding to the brain MRI report of 08/13/2022 which described acute early subacute infarct in this area. Similar findings in the LEFT cerebellum and LEFT temporal occipital lobe. No mass effect or midline shift. No parenchymal hemorrhage. No extra-axial hemorrhage. Old lacunar infarct again noted in the LEFT caudate. Mild chronic small vessel ischemic changes again noted within the bilateral periventricular white matter regions. Vascular: Chronic calcified atherosclerotic changes of the large  vessels at the skull base. No unexpected hyperdense vessel. Skull: Normal. Negative for fracture or focal lesion. Sinuses/Orbits: No acute finding. Other: None. IMPRESSION: 1. Ill-defined areas of low-density within the posterior RIGHT frontal lobe, LEFT cerebellum and LEFT temporal-occipital lobe, corresponding to the findings of acute to early subacute infarcts reported on brain MRI of 08/13/2022. 2. No new findings compared to the recent head CT and brain MRI of 08/13/2022. No mass effect or midline shift on today's exam. No parenchymal or extra-axial hemorrhage. 3. Chronic ischemic changes in the white matter and LEFT caudate. Electronically Signed   By: Franki Cabot M.D.   On: 08/16/2022 09:20   MR BRAIN WO CONTRAST  Result Date: 08/14/2022 CLINICAL DATA:  Follow-up examination for stroke. EXAM: MRI HEAD WITHOUT CONTRAST TECHNIQUE: Multiplanar, multiecho pulse sequences of the brain and surrounding structures were obtained without intravenous contrast. COMPARISON:  Prior CT from earlier the same day. FINDINGS: Brain: Cerebral volume within normal limits. Scattered patchy T2/FLAIR hyperintensity involving the periventricular deep white matter of both cerebral hemispheres as well as the pons, consistent with chronic microvascular ischemic disease, mild for age. Few small remote lacunar infarcts noted at the left caudate head and ventral right thalamus. Patchy diffusion signal abnormality involving the cortical and subcortical aspect of the right frontoparietal region, with involvement of the pre and postcentral gyri, consistent with an acute to early subacute right MCA distribution infarct. No associated hemorrhage. There is an additional patchy small volume acute to early subacute left cerebellar infarct. Minimal petechial blood products at this level without hemorrhagic transformation or significant mass effect. Few additional punctate foci of acute to early subacute ischemia noted within the left  temporal occipital region (series 5, images 19, 16). Gray-white matter differentiation otherwise maintained. No other acute or chronic intracranial blood products. No mass lesion or midline shift. No hydrocephalus or extra-axial fluid collection. Partially empty sella noted. Vascular: Major intracranial vascular flow voids are maintained. Skull and upper cervical spine: Craniocervical junction  normal. Bone marrow signal intensity within normal limits. No scalp soft tissue abnormality. Sinuses/Orbits: Prior bilateral ocular lens replacement. Paranasal sinuses mastoid air cells are clear. Other: None. IMPRESSION: 1. Scattered acute to early subacute infarcts involving the posterior right frontal parietal region, left cerebellum, and left temporoccipital region. Minimal petechial blood products at the left cerebellum without hemorrhagic transformation or significant regional mass effect. Given the various vascular distributions involved, a central thromboembolic etiology is likely. 2. Underlying chronic microvascular ischemic disease with a few small remote lacunar infarcts as above. Electronically Signed   By: Jeannine Boga M.D.   On: 08/14/2022 00:21   CT HEAD WO CONTRAST (5MM)  Result Date: 08/13/2022 CLINICAL DATA:  Dizziness.  Recent stroke. EXAM: CT HEAD WITHOUT CONTRAST TECHNIQUE: Contiguous axial images were obtained from the base of the skull through the vertex without intravenous contrast. RADIATION DOSE REDUCTION: This exam was performed according to the departmental dose-optimization program which includes automated exposure control, adjustment of the mA and/or kV according to patient size and/or use of iterative reconstruction technique. COMPARISON:  08/08/2022 FINDINGS: Brain: No evidence of intracranial hemorrhage, acute infarction, hydrocephalus, extra-axial collection, or mass lesion/mass effect. Old lacunar infarcts are again seen involving the left basal ganglia and left cerebellum.  Vascular:  No hyperdense vessel or other acute findings. Skull: No evidence of fracture or other significant bone abnormality. Sinuses/Orbits:  No acute findings. Other: None. IMPRESSION: No acute intracranial abnormality. Old lacunar infarcts in left basal ganglia and left cerebellum. Electronically Signed   By: Marlaine Hind M.D.   On: 08/13/2022 17:51   ECHOCARDIOGRAM COMPLETE BUBBLE STUDY  Result Date: 08/10/2022    ECHOCARDIOGRAM REPORT   Patient Name:   JANE BIRKEL Date of Exam: 08/10/2022 Medical Rec #:  [id number]    Height:       62.0 in Accession #:    [id number]   Weight:       101.2 lb Date of Birth:  1941/04/13   BSA:          1.431 m Patient Age:    29 years     BP:           134/77 mmHg Patient Gender: F            HR:           65 bpm. Exam Location:  [facility] Procedure: 2D Echo, Color Doppler, Cardiac Doppler and Saline Contrast Bubble            Study Indications:     Stroke 434.91 / I63.9  History:         Patient has prior history of Echocardiogram examinations, most                  recent 12/22/2021. Risk Factors:Dyslipidemia. Migraines.  Sonographer:     Sherrie Sport Referring Phys:  [place] Diagnosing Phys: Serafina Royals MD IMPRESSIONS  1. Left ventricular ejection fraction, by estimation, is 60 to 65%. The left ventricle has normal function. The left ventricle has no regional wall motion abnormalities. Left ventricular diastolic parameters were normal.  2. Right ventricular systolic function is normal. The right ventricular size is normal.  3. The mitral valve is normal in structure. Mild mitral valve regurgitation.  4. The aortic valve is normal in structure. Aortic valve regurgitation is not visualized. FINDINGS  Left Ventricle: Left ventricular ejection fraction, by estimation, is 60 to 65%. The left ventricle has normal function. The left ventricle has no regional  wall motion abnormalities. The left ventricular internal cavity size was normal in size. There is  no left  ventricular hypertrophy. Left ventricular diastolic parameters were normal. Right Ventricle: The right ventricular size is normal. No increase in right ventricular wall thickness. Right ventricular systolic function is normal. Left Atrium: Left atrial size was normal in size. Right Atrium: Right atrial size was normal in size. Pericardium: There is no evidence of pericardial effusion. Mitral Valve: The mitral valve is normal in structure. Mild mitral valve regurgitation. Tricuspid Valve: The tricuspid valve is normal in structure. Tricuspid valve regurgitation is mild. Aortic Valve: The aortic valve is normal in structure. Aortic valve regurgitation is not visualized. Aortic valve mean gradient measures 4.0 mmHg. Aortic valve peak gradient measures 7.8 mmHg. Aortic valve area, by VTI measures 1.98 cm. Pulmonic Valve: The pulmonic valve was normal in structure. Pulmonic valve regurgitation is trivial. Aorta: The aortic root and ascending aorta are structurally normal, with no evidence of dilitation. IAS/Shunts: No atrial level shunt detected by color flow Doppler. Agitated saline contrast was given intravenously to evaluate for intracardiac shunting.  LEFT VENTRICLE PLAX 2D LVIDd:         3.20 cm   Diastology LVIDs:         1.90 cm   LV e' medial:    5.66 cm/s LV PW:         0.60 cm   LV E/e' medial:  15.4 LV IVS:        1.00 cm   LV e' lateral:   8.92 cm/s LVOT diam:     1.90 cm   LV E/e' lateral: 9.8 LV SV:         58 LV SV Index:   40 LVOT Area:     2.84 cm  RIGHT VENTRICLE RV Basal diam:  3.80 cm RV Mid diam:    3.50 cm RV S prime:     19.50 cm/s TAPSE (M-mode): 2.8 cm LEFT ATRIUM           Index        RIGHT ATRIUM           Index LA diam:      2.40 cm 1.68 cm/m   RA Area:     10.20 cm LA Vol (A2C): 33.6 ml 23.48 ml/m  RA Volume:   20.70 ml  14.46 ml/m LA Vol (A4C): 11.4 ml 7.97 ml/m  AORTIC VALVE AV Area (Vmax):    2.01 cm AV Area (Vmean):   2.02 cm AV Area (VTI):     1.98 cm AV Vmax:           139.50  cm/s AV Vmean:          91.400 cm/s AV VTI:            0.291 m AV Peak Grad:      7.8 mmHg AV Mean Grad:      4.0 mmHg LVOT Vmax:         99.00 cm/s LVOT Vmean:        65.000 cm/s LVOT VTI:          0.203 m LVOT/AV VTI ratio: 0.70  AORTA Ao Root diam: 3.20 cm MITRAL VALVE               TRICUSPID VALVE MV Area (PHT): 3.08 cm    TR Peak grad:   17.1 mmHg MV Decel Time: 246 msec    TR Vmax:  207.00 cm/s MV E velocity: 87.00 cm/s MV A velocity: 89.60 cm/s  SHUNTS MV E/A ratio:  0.97        Systemic VTI:  0.20 m                            Systemic Diam: 1.90 cm Serafina Royals MD Electronically signed by Serafina Royals MD Signature Date/Time: 08/10/2022/9:02:45 AM    Final    CT ANGIO HEAD NECK W WO CM  Result Date: 08/08/2022 CLINICAL DATA:  Acute neurologic deficit EXAM: CT ANGIOGRAPHY HEAD AND NECK TECHNIQUE: Multidetector CT imaging of the head and neck was performed using the standard protocol during bolus administration of intravenous contrast. Multiplanar CT image reconstructions and MIPs were obtained to evaluate the vascular anatomy. Carotid stenosis measurements (when applicable) are obtained utilizing NASCET criteria, using the distal internal carotid diameter as the denominator. RADIATION DOSE REDUCTION: This exam was performed according to the departmental dose-optimization program which includes automated exposure control, adjustment of the mA and/or kV according to patient size and/or use of iterative reconstruction technique. CONTRAST:  58m OMNIPAQUE IOHEXOL 350 MG/ML SOLN COMPARISON:  08/07/2022 FINDINGS: CT HEAD FINDINGS Brain: There is no mass, hemorrhage or extra-axial collection. The size and configuration of the ventricles and extra-axial CSF spaces are normal. 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 There is hypoattenuation of the periventricular white matter, most commonly indicating chronic ischemic microangiopathy. Skull: The visualized skull base, calvarium and  extracranial soft tissues are normal. Sinuses/Orbits: No fluid levels or advanced mucosal thickening of the visualized paranasal sinuses. No mastoid or middle ear effusion. The orbits are normal. CTA NECK FINDINGS SKELETON: There is no bony spinal canal stenosis. No lytic or blastic lesion. OTHER NECK: Normal pharynx, larynx and major salivary glands. No cervical lymphadenopathy. Unremarkable thyroid gland. UPPER CHEST: Biapical scarring AORTIC ARCH: There is calcific atherosclerosis of the aortic arch. There is no aneurysm, dissection or hemodynamically significant stenosis of the visualized portion of the aorta. Conventional 3 vessel aortic branching pattern. The visualized proximal subclavian arteries are widely patent. RIGHT CAROTID SYSTEM: No dissection, occlusion or aneurysm. Mild atherosclerotic calcification at the carotid bifurcation without hemodynamically significant stenosis. LEFT CAROTID SYSTEM: No dissection, occlusion or aneurysm. Mild atherosclerotic calcification at the carotid bifurcation without hemodynamically significant stenosis. VERTEBRAL ARTERIES: Left dominant configuration. Both origins are clearly patent. There is no dissection, occlusion or flow-limiting stenosis to the skull base (V1-V3 segments). CTA HEAD FINDINGS POSTERIOR CIRCULATION: --Vertebral arteries: Normal V4 segments. --Inferior cerebellar arteries: Normal. --Basilar artery: Moderate stenosis of the distal basilar artery. --Superior cerebellar arteries: Normal. --Posterior cerebral arteries (PCA): Short segment severe stenosis/occlusion of the left P1 segment with reconstitution. Severe stenosis or short segment occlusion of the proximal left P3 segment. Right PCA is normal. There is a fetal predominant origin of the right PCA and a diminutive left P-comm. ANTERIOR CIRCULATION: --Intracranial internal carotid arteries: Normal. --Anterior cerebral arteries (ACA): Normal. Both A1 segments are present. Patent anterior  communicating artery (a-comm). --Middle cerebral arteries (MCA): Normal. VENOUS SINUSES: As permitted by contrast timing, patent. ANATOMIC VARIANTS: Fetal origin of the right posterior cerebral artery. Review of the MIP images confirms the above findings. IMPRESSION: 1. Short segment severe stenosis/occlusion of the left P1 segment with reconstitution. 2. Severe stenosis or short segment occlusion of the proximal left P3 segment. 3. Moderate stenosis of the distal basilar artery. 4. 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 5. Mild bilateral  carotid bifurcation atherosclerosis without hemodynamically significant stenosis. Electronically Signed   By: Ulyses Jarred M.D.   On: 08/08/2022 19:02   US Venous Img Lower Bilateral (DVT)  Result Date: 08/08/2022 CLINICAL DATA:  Embolic stroke. Hypercoagulable. Clinical concern for DVT. EXAM: BILATERAL LOWER EXTREMITY VENOUS DOPPLER ULTRASOUND TECHNIQUE: Gray-scale sonography with compression, as well as color and duplex ultrasound, were performed to evaluate the deep venous system(s) from the level of the common femoral vein through the popliteal and proximal calf veins. COMPARISON:  None Available. FINDINGS: VENOUS Normal compressibility of the common femoral, superficial femoral, and popliteal veins, as well as the visualized calf veins. Visualized portions of profunda femoral vein and great saphenous vein unremarkable. No filling defects to suggest DVT on grayscale or color Doppler imaging. Doppler waveforms show normal direction of venous flow, normal respiratory plasticity and response to augmentation. Limited views of the contralateral common femoral vein are unremarkable. OTHER None. Limitations: Left peroneal vein not visualized. IMPRESSION: No evidence of a right or left lower extremity deep venous thrombosis. Electronically Signed   By: Lajean Manes M.D.   On: 08/08/2022 18:17   NM Pulmonary Perfusion  Result Date: 08/08/2022 CLINICAL DATA:   Pulmonary embolism suspected. EXAM: NUCLEAR MEDICINE PERFUSION LUNG SCAN TECHNIQUE: Perfusion images were obtained in multiple projections after intravenous injection of radiopharmaceutical. Ventilation scans intentionally deferred if perfusion scan and chest x-ray adequate for interpretation during COVID 19 epidemic. RADIOPHARMACEUTICALS:  4.1 mCi Tc-14mMAA IV COMPARISON:  Chest x-ray earlier same day FINDINGS: Heterogeneous lung perfusion identified in the upper lobes bilaterally and in both lung bases. No definite peripheral wedge-shaped filling defect in either lung. IMPRESSION: Lung perfusion scan indeterminate for acute pulmonary embolus. Heterogeneous perfusion may reflect underlying emphysema. Consider chest CTA to further evaluate if patient renal function permits. Electronically Signed   By: EMisty StanleyM.D.   On: 08/08/2022 10:40   DG Chest Port 1 View  Result Date: 08/08/2022 CLINICAL DATA:  Dyspnea EXAM: PORTABLE CHEST 1 VIEW COMPARISON:  CT 08/07/2022 FINDINGS: The heart size and mediastinal contours are within normal limits. Reticulonodular opacities within the periphery of the right lower lobe, similar to the recent previous CT. No new airspace consolidation. No pleural effusion or pneumothorax. Calcified granuloma at the left lung base. The visualized skeletal structures are unremarkable. IMPRESSION: Unchanged reticulonodular opacities within the periphery of the right lower lobe. No new airspace consolidation. Electronically Signed   By: NDavina PokeD.O.   On: 08/08/2022 09:13   MR Brain W and Wo Contrast  Result Date: 08/07/2022 CLINICAL DATA:  Dizziness EXAM: MRI HEAD WITHOUT AND WITH CONTRAST TECHNIQUE: Multiplanar, multiecho pulse sequences of the brain and surrounding structures were obtained without and with intravenous contrast. CONTRAST:  422mGADAVIST GADOBUTROL 1 MMOL/ML IV SOLN COMPARISON:  08/25/2019 FINDINGS: Brain: Scattered punctate foci of acute ischemia within both  cerebellar hemispheres, left basal ganglia and both frontal lobes. Pattern is most consistent with emboli. No acute or chronic hemorrhage. There is multifocal hyperintense T2-weighted signal within the white matter. Parenchymal volume and CSF spaces are normal. The midline structures are normal. There is no abnormal contrast enhancement. Vascular: Major flow voids are preserved. Skull and upper cervical spine: Normal calvarium and skull base. Visualized upper cervical spine and soft tissues are normal. Sinuses/Orbits:No paranasal sinus fluid levels or advanced mucosal thickening. No mastoid or middle ear effusion. Normal orbits. IMPRESSION: Scattered punctate foci of acute ischemia within both cerebellar hemispheres, left basal ganglia and both frontal lobes. Pattern is most  consistent with emboli, most likely from a cardiac or proximal aortic source. No hemorrhage or mass effect. Electronically Signed   By: Ulyses Jarred M.D.   On: 08/07/2022 21:22   CT HEAD WO CONTRAST  Result Date: 08/07/2022 CLINICAL DATA:  Dizziness on Monday followed by vision changes on Wednesday EXAM: CT HEAD WITHOUT CONTRAST TECHNIQUE: Contiguous axial images were obtained from the base of the skull through the vertex without intravenous contrast. RADIATION DOSE REDUCTION: This exam was performed according to the departmental dose-optimization program which includes automated exposure control, adjustment of the mA and/or kV according to patient size and/or use of iterative reconstruction technique. COMPARISON:  Brain MRI 08/25/2019 FINDINGS: Brain: There is no acute intracranial hemorrhage, extra-axial fluid collection, or acute territorial infarct. Parenchymal volume is normal for age. The ventricles are normal in size. Gray-white differentiation is preserved. There is a small remote lacunar infarct in the left caudate head, new since the prior MRI from 2020. There is no mass lesion.  There is no mass effect or midline shift.  Vascular: No hyperdense vessel or unexpected calcification. Skull: Normal. Negative for fracture or focal lesion. Sinuses/Orbits: The imaged paranasal sinuses are clear. Bilateral lens implants are in place. The globes and orbits are otherwise unremarkable. Other: None. IMPRESSION: 1. No acute intracranial pathology. 2. Small infarct in the left caudate head is new since the brain MRI from 2020 but is remote in appearance. Otherwise, unremarkable for age head CT. Electronically Signed   By: Valetta Mole M.D.   On: 08/07/2022 16:52   CT CHEST ABDOMEN PELVIS W CONTRAST  Result Date: 08/07/2022 CLINICAL DATA:  History of endometrial cancer, rising CA 125. * Tracking Code: BO * EXAM: CT CHEST, ABDOMEN, AND PELVIS WITH CONTRAST TECHNIQUE: Multidetector CT imaging of the chest, abdomen and pelvis was performed following the standard protocol during bolus administration of intravenous contrast. RADIATION DOSE REDUCTION: This exam was performed according to the departmental dose-optimization program which includes automated exposure control, adjustment of the mA and/or kV according to patient size and/or use of iterative reconstruction technique. CONTRAST:  29m OMNIPAQUE IOHEXOL 300 MG/ML  SOLN COMPARISON:  CT December 11, 2021. FINDINGS: CT CHEST FINDINGS Cardiovascular: Aortic atherosclerosis. Normal caliber thoracic aorta. No central pulmonary embolus on this nondedicated study. Coronary artery calcifications. Normal size heart. No significant pericardial effusion/thickening. Mediastinum/Nodes: No supraclavicular adenopathy. No discrete thyroid nodule. No pathologically enlarged mediastinal, hilar or axillary lymph nodes. Unchanged calcified nodal tissue in the subcarinal space. The esophagus is grossly unremarkable. Lungs/Pleura: Biapical pleuroparenchymal scarring. Bronchiectasis with peripheral airway impaction noted in the right lower lobe on image 93/5 with adjacent tree-in-bud nodules. Additional tree-in-bud  nodularity in the right middle lobe, lingula and scattered throughout the right lower lobe. Similar ground-glass opacities in the left lower lobe for instance on image 111/5 measuring 7 mm and 95/5 measuring 5 mm, unchanged. No new suspicious pulmonary nodules or masses. No pleural effusion. No pneumothorax. Musculoskeletal: Biopsy clip in the right breast. No aggressive lytic or blastic lesion of bone. Multilevel degenerative changes spine. Diffuse demineralization of bone. CT ABDOMEN PELVIS FINDINGS Hepatobiliary: No suspicious hepatic lesion. Gallbladder is unremarkable. Common bile duct measures 5 mm, unchanged Pancreas: No pancreatic ductal dilation or evidence of acute inflammation. Spleen: Calcified hepatic granulomata. Adrenals/Urinary Tract: Bilateral adrenal glands appear normal. No hydronephrosis. Kidneys demonstrate symmetric enhancement and excretion of contrast material. Urinary bladder is unremarkable for degree of distension Stomach/Bowel: Radiopaque enteric contrast material traverses the hepatic flexure. Stomach is unremarkable for degree of distension.  No pathologic dilation of small or large bowel. Gas fluid levels throughout the colon suggestive of diarrheal illness. Vascular/Lymphatic: Aortic atherosclerosis. No pathologically enlarged abdominal or pelvic lymph nodes. Reproductive: Uterus is surgically absent, no soft tissue nodularity along the vaginal cuff. No suspicious adnexal mass Other: Mild mesenteric edema with some heterogeneity of the omental fat, similar prior. Trace fluid in the pelvis for instance a focal area of fluid in the posterior left hemipelvis on image 111/2. Mild pelvic peritoneal thickening. No discrete peritoneal or omental nodularity. Musculoskeletal: No aggressive lytic or blastic lesion of bone. Degenerative grade 1 L4 on L5 anterolisthesis. Multilevel degenerative changes spine. Diffuse demineralization of bone. IMPRESSION: 1. Status post hysterectomy without soft  tissue nodularity along the vaginal cuff. 2. Trace pelvic free fluid and pelvic peritoneal thickening with similar mild mesenteric edema and heterogeneity of the omental fat. No discrete peritoneal or omental nodularity. 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 Consider attention on short-term interval follow-up imaging. 3. Bronchiectasis with peripheral airway impaction in the right lower lobe and adjacent tree-in-bud nodules as well as additional tree-in-bud nodularity in the right middle lobe, lingula and scattered throughout the right lower lobe. Findings are favored to reflect an atypical infectious or inflammatory process such as atypical mycobacterial infection. 4. Stable ground-glass opacities in the left lower lobe measuring up to 7 mm, nonspecific possibly reflecting an infectious or inflammatory process. Attention on follow-up imaging suggested. 5. Gas fluid levels throughout the colon as can be seen with diarrheal illness 6.  Aortic Atherosclerosis (ICD10-I70.0). Electronically Signed   By: Dahlia Bailiff M.D.   On: 08/07/2022 12:11    Labs:  Basic Metabolic Panel: Recent Labs  Lab 08/19/22 0443 08/21/22 0728  NA 138 138  K 3.6 3.4*  CL 109 107  CO2 24 23  GLUCOSE 98 129*  BUN 11 12  CREATININE 0.76 1.03*  CALCIUM 9.1 9.7    CBC: Recent Labs  Lab 08/18/22 0406 08/19/22 0443 08/20/22 0441 08/21/22 0728  WBC 5.4 5.9 5.4 5.6  NEUTROABS 3.4  --   --  4.0  HGB 9.6* 9.8* 9.8* 10.9*  HCT 28.2* 28.9* 28.9* 32.0*  MCV 96.9 95.7 97.3 98.2  PLT 129* 140* 140* 157    CBG: Recent Labs  Lab 08/22/22 0652  GLUCAP 98   Family history.  Mother with breast cancer father with CAD.  Negative for diabetes ovarian cancer or rectal cancer  Brief HPI:   OMER MONTER is a 81 y.o. 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  She also finished radiation therapy approxi-1 month ago.  Per chart review lives alone.  Ambulates with a rolling walker.  [facility]ed [facility]13/2022 with increasing dizziness left-sided weakness loss of balance and slurred speech.  Most recent echocardiogram ejection fraction of 60 to 65% no wall motion abnormalities.  CT/MRI follow-up showed interval extension of infarction on the right hemisphere now affecting the deep insula and more the frontal parietal cortical and subcortical brain consistent with progressive right MCA branch vessel infarction.  No evidence of hemorrhage or mass effect.  Newly seen subcentimeter focus of acute infarction in the left frontal white matter.  Underlying chronic microvascular ischemic disease with a few small remote lacunar infarcts.  MRI cervical spine with  no cord insult.  CT angiogram of the chest showed no segmental or pulmonary emboli.  Lower extremity Doppler is negative.  Admission chemistries unremarkable except troponin 89 D-dimer elevated 10.67.  Follow-up TEE negative.  Follow-up neurology services in light of recent recurrent CVA concern for hypercoagulopathy in the setting of malignancy was placed on low-grade heparin and transition to Eliquis and would remain on low-dose aspirin.  Therapy evaluations completed due to patient left-sided weakness decreased functional mobility was admitted for a comprehensive rehab program.   Hospital Course: ANSLEE MICHELETTI was admitted to rehab 08/22/2022 for inpatient therapies to consist of PT, ST and OT at least three hours five days a week. Past admission physiatrist, therapy team and rehab RN have worked together to provide customized collaborative inpatient rehab.  In regards to patient's recurrent acute ischemic stroke in multiple vascular territories right hemisphere affecting deep  insula and more of the frontal parietal cortical and subcortical brain consistent with progressive right MCA branch vessel infarction.  Patient maintained on Eliquis and low-dose aspirin.  During her rehabilitation stay 08/22/2022 left gaze deviation and aphasia she was unable to follow commands.  Code stroke was called patient had rhythmic jerking of her right upper and lower extremity received IV Ativan and loaded with Keppra.  CT of the head showed low-attenuation of the frontal parietal lobe concerning for acute nonhemorrhagic MCA territory infarction.  CT angiogram of head and neck positive for emergent left MCA branch occlusion versus proximal left M3 segment of the dominant posterior left M2 division.  She was discharged to acute care services 08/22/2022 for ongoing medical work-up.   Blood pressures were monitored on TID basis and soft and monitored    Rehab course: During patient's stay in rehab weekly team conferences were held to monitor patient's progress, set goals and discuss barriers to discharge. At admission, patient required min guard 60 feet rolling walker minimal assist sit to stand  Physical exam.  Blood pressure 110/70 pulse 80 respirations 18 oxygen saturation 92% room air Neuro.  Alert unable to answer questions follow commands no intelligible speech.  Left forced gaze deviation sensation intact noted left facial droop.  Withdraws to pain in all 4 extremities  He/She  has had improvement in activity tolerance, balance, postural control as well as ability to compensate for deficits. He/She has had improvement in functional use RUE/LUE  and RLE/LLE as well as improvement in awareness.  Patient not able to complete inpatient rehab program due to transfer discharge to acute care services.       Disposition: Discharged to acute care services    Diet: N.p.o.  Special Instructions: Medications at discharge.  As recommended by hospitalist/neurology team  30-35 minutes were  spent completing discharge summary and discharge planning      Signed: Cathlyn Parsons 08/23/2022, 12:10 PM

## 2022-08-23 NOTE — Evaluation (Signed)
Occupational Therapy Evaluation Patient Details Name: Elizabeth Friedman MRN: [id number] DOB: 06-10-1941 Today's Date: 08/23/2022   History of Present Illness 81 yo female admitted from [facility] 10/28 with unresponsiveness, L gaze, aphasia. CTH shows Acute posterior Left MCA territory infarct affecting the M3 segment. Pt with admission to [facility] on 23/53 for embolic strokes in multiple vascular territories with the largest in the right MCA. PMH includes breast cancer, carcinosarcoma of uterus, R breast, hyperlipidemia, migraine.   Clinical Impression   Pt in bed with family present on arrival, and family providing consent for OT eval. PTA, pt was recently at [facility] making good progress. Prior to original admission, she had been living alone and was mod I. Upon eval, pt presents with limitations in arousal, functional mobility, activity tolerance, balance, and strength. Pt remaining lethargic throughout session despite provision of ROM, noxious stimuli, familiar voices, and position change. Pt requiring total A for all ADL this session and was dependent on therapist for bed mobility and static sitting. Pt was observed to move RUE 2x during session and withdraw BLE to noxious stim. Will continue to follow acutely, and as arousal improves, pt will likely benefit from return to [facility].      Recommendations for follow up therapy are one component of a multi-disciplinary discharge planning process, led by the attending physician.  Recommendations may be updated based on patient status, additional functional criteria and insurance authorization.   Follow Up Recommendations  Acute inpatient rehab (3hours/day) (when can tolerate intensity level.)    Assistance Recommended at Discharge Frequent or constant Supervision/Assistance  Patient can return home with the following Two people to help with walking and/or transfers;Two people to help with bathing/dressing/bathroom;Assistance with cooking/housework;Assistance with  feeding;Direct supervision/assist for medications management;Direct supervision/assist for financial management;Assist for transportation;Help with stairs or ramp for entrance    Functional Status Assessment  Patient has had a recent decline in their functional status and demonstrates the ability to make significant improvements in function in a reasonable and predictable amount of time.  Equipment Recommendations  Other (comment) (TBD)    Recommendations for Other Services       Precautions / Restrictions Precautions Precautions: Fall Precaution Comments: on continuous EEG, L hemi with L inattention Restrictions Weight Bearing Restrictions: No      Mobility Bed Mobility Overal bed mobility: Needs Assistance Bed Mobility: Supine to Sit, Sit to Supine     Supine to sit: Total assist, +2 for safety/equipment, HOB elevated Sit to supine: Total assist, +2 for safety/equipment   General bed mobility comments: pt with no attempt to assist, dependent on assist from therapists    Transfers                   General transfer comment: unsafe to attempt due to lethargy and lack of command following      Balance Overall balance assessment: Needs assistance Sitting-balance support: No upper extremity supported, Feet supported Sitting balance-Leahy Scale: Zero Sitting balance - Comments: dependent on assist from therapist Postural control: Posterior lean, Right lateral lean, Left lateral lean                                 ADL either performed or assessed with clinical judgement   ADL Overall ADL's : Needs assistance/impaired Eating/Feeding: NPO   Grooming: Total assistance;Sitting   Upper Body Bathing: Total assistance;Bed level   Lower Body Bathing: Total assistance;Bed level;+2 for physical assistance;+2 for  safety/equipment   Upper Body Dressing : Total assistance;Bed level   Lower Body Dressing: Total assistance;Bed level;+2 for physical  assistance;+2 for safety/equipment   Toilet Transfer: Total assistance;+2 for physical assistance;+2 for safety/equipment   Toileting- Clothing Manipulation and Hygiene: Total assistance;+2 for physical assistance;+2 for safety/equipment   Tub/ Shower Transfer: Total assistance;+2 for physical assistance;+2 for safety/equipment   Functional mobility during ADLs: Total assistance;+2 for physical assistance;+2 for safety/equipment (deferring transfers this date due to pt lethargy)       Vision Baseline Vision/History: 1 Wears glasses Ability to See in Adequate Light: 1 Impaired Patient Visual Report: No change from baseline Vision Assessment?: Vision impaired- to be further tested in functional context Additional Comments: no eyhe opening this session     Perception     Praxis      Pertinent Vitals/Pain Pain Assessment Pain Assessment: Faces Faces Pain Scale: No hurt Pain Intervention(s): Monitored during session     Hand Dominance Right   Extremity/Trunk Assessment Upper Extremity Assessment Upper Extremity Assessment: Difficult to assess due to impaired cognition (Pt did spontaneously move RUE 2x during session in flexin toward mouth, but not reaching mouth, so unsure if volitional, and not following commands. PROM WFL BUE)   Lower Extremity Assessment Lower Extremity Assessment: Defer to PT evaluation   Cervical / Trunk Assessment Cervical / Trunk Assessment: Kyphotic   Communication Communication Communication:  (Pt with no attempts to verbalize or follow commands this session)   Cognition Arousal/Alertness: Lethargic Behavior During Therapy: Flat affect Overall Cognitive Status: Difficult to assess                                 General Comments: no response to cues due to lethargy, even with max stimulation and change in position. does respond to noxious stimuli briskly with BLE.     General Comments  family present and supportive     Exercises     Shoulder Instructions      Home Living Family/patient expects to be discharged to:: Private residence Living Arrangements: Alone Available Help at Discharge: Family;Available PRN/intermittently Type of Home: House Home Access: Stairs to enter [employer] of Steps: 2-3 steps on the side with pull bar Entrance Stairs-Rails: Right Home Layout: One level     Bathroom Shower/Tub: Tub/shower unit;Walk-in shower   Bathroom Toilet: Standard Bathroom Accessibility: Yes How Accessible: Accessible via walker Home Equipment: Shower seat - built in;Hand held [employer] (4 wheels)   Additional Comments: Son lives ~20 min away, daughter ~30  Lives With: Alone    Prior Functioning/Environment Prior Level of Function : Independent/Modified Independent;Driving             Mobility Comments: amb with rollator, minA to walk 150 ft with RW at evaluation at [facility] on 10/26 ADLs Comments: prior to previous admission, indep in ADL/IADL; light assist for ADLs, assist for IADLs after previous admission        OT Problem List: Decreased strength;Decreased knowledge of use of DME or AE;Decreased activity tolerance;Impaired balance (sitting and/or standing);Impaired vision/perception;Impaired sensation;Decreased cognition;Decreased safety awareness      OT Treatment/Interventions: Self-care/ADL training;Patient/family education;Therapeutic exercise;Balance training;Energy conservation;Therapeutic activities;DME and/or AE instruction;Cognitive remediation/compensation    OT Goals(Current goals can be found in the care plan section) Acute Rehab OT Goals Patient Stated Goal: unable to state OT Goal Formulation: With patient/family Time For Goal Achievement: 09/06/22 Potential to Achieve Goals: Fair  OT Frequency: Min 2X/week  Co-evaluation PT/OT/SLP Co-Evaluation/Treatment: Yes Reason for Co-Treatment: Complexity of the patient's impairments (multi-system  involvement);For patient/therapist safety;To address functional/ADL transfers;Necessary to address cognition/behavior during functional activity PT goals addressed during session: Mobility/safety with mobility OT goals addressed during session: ADL's and self-care      AM-PAC OT "6 Clicks" Daily Activity     Outcome Measure Help from another person eating meals?: Total Help from another person taking care of personal grooming?: Total Help from another person toileting, which includes using toliet, bedpan, or urinal?: Total Help from another person bathing (including washing, rinsing, drying)?: Total Help from another person to put on and taking off regular upper body clothing?: Total Help from another person to put on and taking off regular lower body clothing?: Total 6 Click Score: 6   End of Session Nurse Communication: Mobility status (no response to change in position)  Activity Tolerance: Patient limited by lethargy Patient left: in bed;with call bell/phone within reach;with bed alarm set;with family/visitor present  OT Visit Diagnosis: Unsteadiness on feet (R26.81);Muscle weakness (generalized) (M62.81);Other symptoms and signs involving cognitive function                Time: 4259-5638 OT Time Calculation (min): 21 min Charges:  OT General Charges $OT Visit: 1 Visit OT Evaluation $OT Eval Moderate Complexity: 1 Mod  Elizabeth Friedman, [facility] Acute Rehabilitation Office: [phone]   Elizabeth Friedman 08/23/2022, 10:27 AM

## 2022-08-23 NOTE — Discharge Summary (Deleted)
  The note originally documented on this encounter has been moved the the encounter in which it belongs.  

## 2022-08-23 NOTE — Evaluation (Signed)
Physical Therapy Evaluation Patient Details Name: EULALAH RUPERT MRN: [id number] DOB: February 21, 1941 Today's Date: 08/23/2022  History of Present Illness  81 yo female admitted from [facility] 10/28 with unresponsiveness, L gaze, aphasia. CTH shows Acute posterior Left MCA territory infarct affecting the M3 segment. Pt with admission to [facility] on 53/66 for embolic strokes in multiple vascular territories with the largest in the right MCA. PMH includes breast cancer, carcinosarcoma of uterus, R breast, hyperlipidemia, migraine.   Clinical Impression  Pt in bed upon arrival of PT, agreeable to evaluation at this time. Prior to original admission, the pt was independent with mobility, living alone, and very active. She required minA to complete OOB transfers and ambulation and min-modA for ADLs upon arrival at [place] on 10/26. The pt now presents with limitations in functional mobility, arousal, command following, strength, activity tolerance, and stability due to above dx, and will continue to benefit from skilled PT to address these deficits. The pt remains lethargic this morning and did not respond to verbal or tactile cues, and showed no change in arousal or responsiveness with change in position. She was dependent on therapist support to maintain static sitting balance, and required totalA to complete transition to sitting EOB. She did respond briskly to noxious stimuli in BLE and show spontaneous movement of RUE x1 and RLE x2 during session. Will continue to benefit from skilled PT acutely to progress strength, mobility, and stability as arousal and alertness improve, and will likely benefit from return to acute inpatient rehab when medically stable and better able to participate in intensity of rehab.       Recommendations for follow up therapy are one component of a multi-disciplinary discharge planning process, led by the attending physician.  Recommendations may be updated based on patient status, additional  functional criteria and insurance authorization.  Follow Up Recommendations Acute inpatient rehab (3hours/day) (when able to tolerate intensity of rehab) Can patient physically be transported by private vehicle: No    Assistance Recommended at Discharge Frequent or constant Supervision/Assistance  Patient can return home with the following  Two people to help with walking and/or transfers;Two people to help with bathing/dressing/bathroom;Assistance with cooking/housework;Assistance with feeding;Direct supervision/assist for medications management;Direct supervision/assist for financial management;Assist for transportation;Help with stairs or ramp for entrance    Equipment Recommendations None recommended by PT (defer to post acute)  Recommendations for Other Services  Rehab consult    Functional Status Assessment Patient has had a recent decline in their functional status and demonstrates the ability to make significant improvements in function in a reasonable and predictable amount of time.     Precautions / Restrictions Precautions Precautions: Fall Precaution Comments: on continuous EEG, L hemi with L inattention Restrictions Weight Bearing Restrictions: No      Mobility  Bed Mobility Overal bed mobility: Needs Assistance Bed Mobility: Supine to Sit, Sit to Supine     Supine to sit: Total assist, +2 for safety/equipment, HOB elevated Sit to supine: Total assist, +2 for safety/equipment   General bed mobility comments: pt with no attempt to assist, dependent on assist from therapists    Transfers                   General transfer comment: unsafe to attempt due to lethargy and lack of command following    Modified Rankin (Stroke Patients Only) Modified Rankin (Stroke Patients Only) Pre-Morbid Rankin Score: Moderately severe disability Modified Rankin: Severe disability     Balance Overall balance assessment: Needs assistance  Sitting-balance support: No  upper extremity supported, Feet supported Sitting balance-Leahy Scale: Zero Sitting balance - Comments: dependent on assist from therapist Postural control: Posterior lean, Right lateral lean, Left lateral lean                                   Pertinent Vitals/Pain Pain Assessment Pain Assessment: Faces Faces Pain Scale: No hurt Pain Intervention(s): Monitored during session    Home Living Family/patient expects to be discharged to:: Private residence Living Arrangements: Alone Available Help at Discharge: Family;Available PRN/intermittently Type of Home: House Home Access: Stairs to enter Entrance Stairs-Rails: Right Entrance Stairs-Number of Steps: 2-3 steps on the side with pull bar   Home Layout: One level Home Equipment: Shower seat - built in;Hand held [employer] (4 wheels) Additional Comments: Son lives ~20 min away, daughter ~30    Prior Function Prior Level of Function : Independent/Modified Independent;Driving             Mobility Comments: amb with rollator, minA to walk 150 ft with RW at evaluation at [facility] on 10/26 ADLs Comments: prior to previous admission, indep in ADL/IADL; light assist for ADLs, assist for IADLs after previous admission     Hand Dominance   Dominant Hand: Right    Extremity/Trunk Assessment   Upper Extremity Assessment Upper Extremity Assessment: Defer to OT evaluation    Lower Extremity Assessment Lower Extremity Assessment: Difficult to assess due to impaired cognition (pt with brisk response to noxious stimuli and goor ROM at all joints bilaterally, no volitional movment to command. spontaneous movement of RLE noted x2)    Cervical / Trunk Assessment Cervical / Trunk Assessment: Kyphotic  Communication   Communication:  (pt with no attempt at verbalization this session)  Cognition Arousal/Alertness: Lethargic Behavior During Therapy: Flat affect Overall Cognitive Status: Difficult to assess                                  General Comments: no response to cues due to lethargy, even with max stimulation and change in position. does respond to noxious stimuli briskly with BLE        General Comments General comments (skin integrity, edema, etc.): family present and supportive        Assessment/Plan    PT Assessment Patient needs continued PT services  PT Problem List Decreased activity tolerance;Decreased balance;Decreased knowledge of use of DME;Decreased safety awareness;Decreased strength;Decreased mobility       PT Treatment Interventions DME instruction;Gait training;Functional mobility training;Therapeutic activities;Stair training;Therapeutic exercise;Balance training;Neuromuscular re-education;Patient/family education    PT Goals (Current goals can be found in the Care Plan section)  Acute Rehab PT Goals Patient Stated Goal: get back home PT Goal Formulation: With patient Time For Goal Achievement: 09/06/22 Potential to Achieve Goals: Good    Frequency Min 4X/week     Co-evaluation PT/OT/SLP Co-Evaluation/Treatment: Yes Reason for Co-Treatment: Complexity of the patient's impairments (multi-system involvement);Necessary to address cognition/behavior during functional activity;For patient/therapist safety;To address functional/ADL transfers PT goals addressed during session: Mobility/safety with mobility;Strengthening/ROM;Balance         AM-PAC PT "6 Clicks" Mobility  Outcome Measure Help needed turning from your back to your side while in a flat bed without using bedrails?: Total Help needed moving from lying on your back to sitting on the side of a flat bed without using bedrails?: Total Help needed  moving to and from a bed to a chair (including a wheelchair)?: Total Help needed standing up from a chair using your arms (e.g., wheelchair or bedside chair)?: Total Help needed to walk in hospital room?: Total Help needed climbing 3-5 steps with a  railing? : Total 6 Click Score: 6    End of Session   Activity Tolerance: Patient limited by lethargy Patient left: in bed;with call bell/phone within reach;with bed alarm set;with family/visitor present (chair position in bed) Nurse Communication: Mobility status PT Visit Diagnosis: Muscle weakness (generalized) (M62.81);Ataxic gait (R26.0);Difficulty in walking, not elsewhere classified (R26.2);Unsteadiness on feet (R26.81)    Time: 4142-3953 PT Time Calculation (min) (ACUTE ONLY): 23 min   Charges:   PT Evaluation $PT Eval Moderate Complexity: 1 Mod          [place], PT, DPT   Acute Rehabilitation Department  Sandra Cockayne 08/23/2022, 10:12 AM

## 2022-08-23 NOTE — Progress Notes (Signed)
SLP Cancellation Note  Patient Details Name: Elizabeth Friedman MRN: [id number] DOB: April 23, 1941   Cancelled treatment:       Reason Eval/Treat Not Completed: Fatigue/lethargy limiting ability to participate (Pt unable to maintain an adequate level of alertness to participate in a swallow evaluation. SLP will plan to follow up on subsequent date unless contacted sooner by RN regarding improved alertness; [name], RN in agreement with plan.)  Tionne Carelli I. Hardin Negus, [place], [place] Office number [phone]  Elizabeth Friedman 08/23/2022, 10:29 AM

## 2022-08-23 NOTE — Progress Notes (Signed)
LTM EEG discontinued - no skin breakdown at unhook.   

## 2022-08-23 NOTE — Progress Notes (Addendum)
[facility] PROGRESS NOTE   Elizabeth Friedman BMW:[id number] DOB: 19-Dec-1940 DOA: 08/22/2022  PCP: Adin Hector, MD  Brief History/Interval Summary: 81 y.o. female with medical history significant of recurrent CVA on Eliquis, dyslipidemia, GERD, osteoarthritis, vitamin B12 deficiency, breast and uterine cancer. Patient had just recently been hospitalized at [facility] from 10/19-10/26 with early subacute infarct involving the posterior right frontal parietal region, left cerebellum, and left temporal occipital region confirmed on MRI.  There was concern for hypercoagulability in the setting malignancy due to recurrent strokes on aspirin and Plavix.  TEE was negative.  After discussions with oncology Plavix was discontinued, aspirin continued, and heparin drip transitioned to Eliquis p.o.  Patient had been sent to inpatient rehab for further treatment.  A code stroke was called on the morning of 10/28.  This was because the patient was found to have a left gaze deviation and aphasia.  Subsequently there was concern for seizure activity.  She was loaded with Keppra.  Patient was hospitalized for further management.  Neurology was consulted.  Patient was placed on long-term monitoring.  Consultants: Neurology  Procedures: Continuous EEG    Subjective/Interval History: Patient is not very responsive this morning.  Eyes are closed.    Assessment/Plan:  Acute left MCA stroke Neurology is following.  Patient was recently hospitalized at [facility] with subacute stroke. Patient was not a candidate for thrombolytics as patient was on Eliquis. Patient was on aspirin and Eliquis.  Eliquis currently on hold due to concern for hemorrhagic transformation. MRI is pending. LDL was 86 on October 20.  HbA1c 5.7 on October 14. Patient underwent TEE on October 23 which did not show any shunts.  LVEF was 65 to 70%.  No thrombus was identified. PT and OT consulted.  Status epilepticus Probably related to  stroke.  Patient was given Ativan and loaded with Keppra.  Currently on Keppra 500 mg twice a day.  Seizure precautions.  On continuous EEG.  PSVT Brief run of SVT noted on telemetry.  Noted to be regular.  We will check TSH.  Metoprolol as needed.  Continue to monitor for now.  History of endometrial and breast cancer Patient with history of endometrial mass s/p removal 20 years ago.  Patient then had right breast DCIS status postlumpectomy with radiation treatment in 2021.  Later patient underwent total laparoscopic hysterectomy with bilateral salpingo oophorectomy 12/24/2021 for which pathology report revealed carcinosarcoma.  She had been treated with chemotherapy and radiation, but family notes most recent blood noted persistent elevated CA-125 in the 40s. Looks like patient is followed by medical oncologist Dr. Janese Banks in [place].  Last seen by Dr. Fara Chute on October 11.  At that time patient was doing well with no concerning signs symptoms of recurrence of her cancer.  Patient also supposed to follow-up with GYN oncology. Does not appear that patient is on any active cancer treatment currently.  Dyslipidemia On atorvastatin.  Bronchiectasis She was supposed to be on azithromycin for chronic bronchiectasis for 3 months/months from Dr. Lanney Gins note 06/2022.  This had been resumed following discharge 10/26. -Consider resuming azithromycin when able   History of migraine headaches -Topamax on hold   GERD -Continue Protonix IV  Goals of care Long discussion with patient's son and daughter and granddaughter.  They were updated on patient's clinical status.  They understand the situation.  Patient's known wishes were reviewed.  She is already a DNR.  She also would not want any artificial nutrition.  Feeding tube  was discussed with family and they are certain of patient's wishes so we will not place any kind of feeding tube in this individual.  Aspiration risk was also discussed with them and  they understand.  Prognosis is guarded.    DVT Prophylaxis: Apixaban on hold.  SCDs for now. Code Status: DNR Family Communication: No family at bedside Disposition Plan: To be determined  Status is: Inpatient Remains inpatient appropriate because: Acute stroke, seizure      Medications: Scheduled:  aspirin  81 mg Oral Daily   Or   aspirin  300 mg Rectal Daily   atorvastatin  40 mg Oral Daily   pantoprazole (PROTONIX) IV  40 mg Intravenous Q24H   Continuous:  sodium chloride 75 mL/hr at 08/23/22 0530   levETIRAcetam 500 mg (08/23/22 1032)   [id number] **OR** acetaminophen (TYLENOL) oral liquid 160 mg/5 mL **OR** acetaminophen, LORazepam  Antibiotics: Anti-infectives (From admission, onward)    None       Objective:  Vital Signs  Vitals:   08/22/22 2006 08/22/22 2354 08/23/22 0410 08/23/22 0738  BP: (!) 155/83 133/82 135/86 (!) 145/67  Pulse: (!) 110 (!) 102 80 79  Resp: '17 19 17 17  '$ Temp: 100.1 F (37.8 C) 98.5 F (36.9 C) 98.5 F (36.9 C) 98.4 F (36.9 C)  TempSrc: Oral Oral Oral Oral  SpO2: 96% 99% 96% 90%    Intake/Output Summary (Last 24 hours) at 08/23/2022 1025 Last data filed at 08/23/2022 0530 Gross per 24 hour  Intake 1520.19 ml  Output 725 ml  Net 795.19 ml   There were no vitals filed for this visit.  General appearance: Poorly responsive Resp: Clear to auscultation bilaterally.  Normal effort Cardio: S1-S2 is normal regular.  No S3-S4.  No rubs murmurs or bruit GI: Abdomen is soft.  Nontender nondistended.  Bowel sounds are present normal.  No masses organomegaly Extremities: No edema.       Lab Results:  Data Reviewed: I have personally reviewed following labs and reports of the imaging studies  CBC: Recent Labs  Lab 08/17/22 1643 08/18/22 0406 08/19/22 0443 08/20/22 0441 08/21/22 0728  WBC 7.1 5.4 5.9 5.4 5.6  NEUTROABS  --  3.4  --   --  4.0  HGB 9.4* 9.6* 9.8* 9.8* 10.9*  HCT 27.2* 28.2* 28.9* 28.9* 32.0*   MCV 96.8 96.9 95.7 97.3 98.2  PLT 127* 129* 140* 140* 254    Basic Metabolic Panel: Recent Labs  Lab 08/19/22 0443 08/21/22 0728  NA 138 138  K 3.6 3.4*  CL 109 107  CO2 24 23  GLUCOSE 98 129*  BUN 11 12  CREATININE 0.76 1.03*  CALCIUM 9.1 9.7    GFR: Estimated Creatinine Clearance: 28.3 mL/min (A) (by C-G formula based on SCr of 1.03 mg/dL (H)).  Liver Function Tests: Recent Labs  Lab 08/21/22 0728  AST 19  ALT 18  ALKPHOS 79  BILITOT 0.4  PROT 6.2*  ALBUMIN 3.6     Coagulation Profile: Recent Labs  Lab 08/18/22 0942  INR 1.3*     CBG: Recent Labs  Lab 08/22/22 0652  GLUCAP 98     Radiology Studies: Overnight EEG with video  Result Date: 08/22/2022 Lora Havens, MD     08/23/2022  7:47 AM Patient Name: KHALIYAH NORTHROP MRN: [id number] Epilepsy Attending: Lora Havens Referring Physician/Provider: Derek Jack, MD Duration: 08/22/2022 1007 to 08/23/2022 0730  Patient history: 81 y.o. female  with medical hx of previous CVA (  10/13), HLD, B12 deficiency, breast and uterine cancer, stage 1 uterine carcinosarcoma. On Eliquis (apixaban) daily. Now complaining of left sided gaze, aphasia, and worsening left sided deficits. EEEG to evaluate for seizure  Level of alertness: awake, asleep  AEDs during EEG study: LEV  Technical aspects: This EEG study was done with scalp electrodes positioned according to the 10-20 International system of electrode placement. Electrical activity was reviewed with band pass filter of 1-'70Hz'$ , sensitivity of 7 uV/mm, display speed of 76m/sec with a '60Hz'$  notched filter applied as appropriate. EEG data were recorded continuously and digitally stored.  Video monitoring was available and reviewed as appropriate.  Description: No clear posterior dominant rhythm was seen. Sleep was characterized by sleep spindles (12-14 Hz), maximal fronto-central region. EEG showed continuous generalized and lateralized right hemisphere 3 to 6 Hz  theta-delta slowing admixed with 13-'15Hz'$  frontocentral region. Sharp transients were noted in left frontal region. Hyperventilation and photic stimulation were not performed.    ABNORMALITY - Continuous slow, generalized and lateralized right hemisphere  IMPRESSION: This study is suggestive of cortical dysfunction in right hemisphere likely secondary to underlying stroke. Additionally there is severe diffuse encephalopathy, nonspecific etiology. No seizures or epileptiform discharges were seen throughout the recording.   PLora Havens   EEG adult  Result Date: 08/22/2022 YLora Havens MD     08/22/2022 10:51 AM Patient Name: EANTOINETTA BERRONESMRN: [id number] Attending: PLora HavensReferring Physician/Provider: SDerek Jack MD Date: 08/22/2022 Duration: 23.41 mins Patient history: 81y.o. female  with medical hx of previous CVA (10/13), HLD, B12 deficiency, breast and uterine cancer, stage 1 uterine carcinosarcoma. On Eliquis (apixaban) daily. Now complaining of left sided gaze, aphasia, and worsening left sided deficits. EEEG to evaluate for seizure Level of alertness:  lethargic AEDs during EEG study: None Technical aspects: This EEG study was done with scalp electrodes positioned according to the 10-20 International system of electrode placement. Electrical activity was reviewed with band pass filter of 1-'70Hz'$ , sensitivity of 7 uV/mm, display speed of 363msec with a '60Hz'$  notched filter applied as appropriate. EEG data were recorded continuously and digitally stored.  Video monitoring was available and reviewed as appropriate. Description: EEG showed continuous generalized 3 to 6 Hz theta-delta slowing admixed with 13-'15Hz'$  frontocentral region. Hyperventilation and photic stimulation were not performed.   ABNORMALITY - Continuous slow, generalized IMPRESSION: This study is suggestive of severe diffuse encephalopathy, nonspecific etiology. No seizures or epileptiform discharges were seen  throughout the recording. Priyanka O Barbra Sarks CT ANGIO HEAD NECK W WO CM W PERF (CODE STROKE)  Result Date: 08/22/2022 CLINICAL DATA:  [age]ear old female with evidence of acute left MCA infarct on plain head CT. Recent right MCA infarct. EXAM: CT ANGIOGRAPHY HEAD AND NECK CT PERFUSION BRAIN TECHNIQUE: Multidetector CT imaging of the head and neck was performed using the standard protocol during bolus administration of intravenous contrast. Multiplanar CT image reconstructions and MIPs were obtained to evaluate the vascular anatomy. Carotid stenosis measurements (when applicable) are obtained utilizing NASCET criteria, using the distal internal carotid diameter as the denominator. Multiphase CT imaging of the brain was performed following IV bolus contrast injection. Subsequent parametric perfusion maps were calculated using RAPID software. RADIATION DOSE REDUCTION: This exam was performed according to the departmental dose-optimization program which includes automated exposure control, adjustment of the mA and/or kV according to patient size and/or use of iterative reconstruction technique. COMPARISON:  Plain head CT today 0727 hours. Recent CTA head and neck  08/08/2022. FINDINGS: CT Brain Perfusion Findings: ASPECTS: 9 CBF (<30%) Volume: 40 mLmL, posterior left MCA territory Perfusion (Tmax>6.0s) volume: 10m, although this includes some oligemia in the contralateral right hemisphere. Hypoperfusion index 0.3, but there appears to be substantial T-max greater than 10 S in the posterior left MCA territory. Mismatch Volume: Less than 512m and perhaps a very little mismatch given the above. Infarction Location:Acute is in the left MCA CTA NECK Skeleton: Stable.  Cervical facet degeneration. Upper chest: Stable apical lung scarring. Stable visible superior mediastinum. Other neck: Mild retained secretions in the pharynx, otherwise stable. Aortic arch: Stable Calcified aortic atherosclerosis. Mildly bovine arch  configuration. Right carotid system: Stable. No significant stenosis despite some plaque. Left carotid system: Stable.  Mild for age plaque and no stenosis. Vertebral arteries: Stable. Proximal left subclavian artery plaque without significant stenosis. Dominant left vertebral artery. Late entry of the right vertebral artery into the cervical transverse foramen. No significant stenosis to the skull base. CTA HEAD Posterior circulation: Stable. No posterior circulation large vessel occlusion or significant stenosis. Anterior circulation: Both ICA siphons are stable and patent. Mild plaque and no significant stenosis. Bilateral ACAs and right MCAs are stable from the recent CTA. Left MCA M1 segment and bifurcation remain patent. There is an acute left MCA branch occlusion, which is the dominant posterior M2 branch although the occlusion occurs just distal to a branch takeoff as seen on series 13, image 25, and is technically at the proximal M3 level. Other left MCA branches appear stable. Venous sinuses: Early contrast timing, grossly patent. Anatomic variants: Unchanged from description on prior CTA. Review of the MIP images confirms the above findings Preliminary result of left MCA branch occlusion were communicated to Dr. COSu Monksn 08/22/2022 at 0746 hours and then discussed by telephone at 0747 hours. IMPRESSION: 1. Positive for emergent Left MCA branch occlusion, very proximal Left M3 segment of the dominant posterior left M2 division. 2. However, CTP estimates a larger infarct core (40 mL) relative to the ASPECTS of 9. And conspicuous Tmax>10s in the posterior Left MCA territory suggests whatever core is present might be rapidly expanding. 3. The above was discussed by telephone with Dr. COSu Monksn 08/22/2022 at 0756 hours. 4. Otherwise stable CTA head and neck since 08/08/2022. Electronically Signed   By: H Genevie Ann.D.   On: 08/22/2022 08:02   CT HEAD CODE STROKE WO CONTRAST  Result Date:  08/22/2022 CLINICAL DATA:  Code stroke. [age]ear old female code stroke presentation with leftward gaze which is new. Recent right MCA infarct. EXAM: CT HEAD WITHOUT CONTRAST TECHNIQUE: Contiguous axial images were obtained from the base of the skull through the vertex without intravenous contrast. RADIATION DOSE REDUCTION: This exam was performed according to the departmental dose-optimization program which includes automated exposure control, adjustment of the mA and/or kV according to patient size and/or use of iterative reconstruction technique. COMPARISON:  None Available. FINDINGS: Brain: Of all vein right MCA territory infarct primarily along the sylvian fissure with mild petechial hemorrhage on series 2, image 19, perhaps slightly increased in conspicuity from yesterday but no malignant hemorrhagic transformation or mass effect. Chronic lacunar infarct left caudate nucleus. Subtle loss of gray-white matter differentiation in the posterior left MCA territory most apparent on images 14 and 15 affecting the posterior temporal lobe, left M3 segment. Other gray-white matter differentiation stable, including small cerebellar infarcts. No midline shift or ventriculomegaly. Normal basilar cisterns. Vascular: Stable. Skull: No acute osseous abnormality identified. Sinuses/Orbits: Visualized paranasal sinuses and  mastoids are stable and well aerated. Other: Stable orbit and scalp soft tissues. ASPECTS [facility] Stroke Program Early CT Score) Total score (0-10 with 10 being normal): 9, abnormal left M3 segment. Study discussed by telephone with Dr. Quinn Axe on 08/22/2022 at 07:35 . IMPRESSION: 1. Acute posterior Left MCA territory infarct affecting the M3 segment. ASPECTS 9. 2. No malignant hemorrhagic transformation or intracranial mass effect. But evidence of petechial hemorrhage in the recent Right MCA infarct. 3. Study discussed by telephone with Dr. Concepcion Living on 08/22/2022 at 07:35 . Electronically Signed   By: Genevie Ann  M.D.   On: 08/22/2022 07:39   CT HEAD WO CONTRAST (5MM)  Result Date: 08/21/2022 CLINICAL DATA:  Altered mental status EXAM: CT HEAD WITHOUT CONTRAST TECHNIQUE: Contiguous axial images were obtained from the base of the skull through the vertex without intravenous contrast. RADIATION DOSE REDUCTION: This exam was performed according to the departmental dose-optimization program which includes automated exposure control, adjustment of the mA and/or kV according to patient size and/or use of iterative reconstruction technique. COMPARISON:  08/16/2022 FINDINGS: Brain: Low-attenuation of the frontoparietal lobe concerning for an acute MCA territory infarct. No evidence of hemorrhage, hydrocephalus, extra-axial collection or mass lesion/mass effect. Vascular: No hyperdense vessel or unexpected calcification. Skull: No osseous abnormality. Sinuses/Orbits: Visualized paranasal sinuses are clear. Visualized mastoid sinuses are clear. Visualized orbits demonstrate no focal abnormality. Other: None IMPRESSION: 1. Low-attenuation of the frontoparietal lobe concerning for an acute non-hemorrhagic MCA territory infarct. Electronically Signed   By: Kathreen Devoid M.D.   On: 08/21/2022 15:25       LOS: 1 day   [facility] Pager on [email or web address]  08/23/2022, 10:25 AM

## 2022-08-24 ENCOUNTER — Encounter (INDEPENDENT_AMBULATORY_CARE_PROVIDER_SITE_OTHER): Payer: Self-pay

## 2022-08-24 DIAGNOSIS — I6340 Cerebral infarction due to embolism of unspecified cerebral artery: Secondary | ICD-10-CM | POA: Diagnosis not present

## 2022-08-24 DIAGNOSIS — R569 Unspecified convulsions: Secondary | ICD-10-CM | POA: Diagnosis not present

## 2022-08-24 DIAGNOSIS — I69398 Other sequelae of cerebral infarction: Secondary | ICD-10-CM | POA: Diagnosis not present

## 2022-08-24 DIAGNOSIS — I214 Non-ST elevation (NSTEMI) myocardial infarction: Secondary | ICD-10-CM | POA: Diagnosis not present

## 2022-08-24 DIAGNOSIS — Z8542 Personal history of malignant neoplasm of other parts of uterus: Secondary | ICD-10-CM | POA: Diagnosis not present

## 2022-08-24 DIAGNOSIS — I639 Cerebral infarction, unspecified: Secondary | ICD-10-CM | POA: Diagnosis not present

## 2022-08-24 DIAGNOSIS — E785 Hyperlipidemia, unspecified: Secondary | ICD-10-CM | POA: Diagnosis not present

## 2022-08-24 DIAGNOSIS — R202 Paresthesia of skin: Secondary | ICD-10-CM | POA: Diagnosis not present

## 2022-08-24 DIAGNOSIS — G43909 Migraine, unspecified, not intractable, without status migrainosus: Secondary | ICD-10-CM | POA: Diagnosis not present

## 2022-08-24 DIAGNOSIS — I7300 Raynaud's syndrome without gangrene: Secondary | ICD-10-CM | POA: Diagnosis not present

## 2022-08-24 DIAGNOSIS — K219 Gastro-esophageal reflux disease without esophagitis: Secondary | ICD-10-CM | POA: Diagnosis not present

## 2022-08-24 DIAGNOSIS — G40901 Epilepsy, unspecified, not intractable, with status epilepticus: Secondary | ICD-10-CM | POA: Diagnosis not present

## 2022-08-24 LAB — COMPREHENSIVE METABOLIC PANEL
ALT: 59 U/L — ABNORMAL HIGH (ref 0–44)
AST: 70 U/L — ABNORMAL HIGH (ref 15–41)
Albumin: 2.9 g/dL — ABNORMAL LOW (ref 3.5–5.0)
Alkaline Phosphatase: 65 U/L (ref 38–126)
Anion gap: 13 (ref 5–15)
BUN: 12 mg/dL (ref 8–23)
CO2: 15 mmol/L — ABNORMAL LOW (ref 22–32)
Calcium: 8.8 mg/dL — ABNORMAL LOW (ref 8.9–10.3)
Chloride: 110 mmol/L (ref 98–111)
Creatinine, Ser: 0.89 mg/dL (ref 0.44–1.00)
GFR, Estimated: >60 mL/min (ref >60)
Glucose, Bld: 95 mg/dL (ref 70–99)
Potassium: 3.4 mmol/L — ABNORMAL LOW (ref 3.5–5.1)
Sodium: 138 mmol/L (ref 135–145)
Total Bilirubin: 0.8 mg/dL (ref 0.3–1.2)
Total Protein: 5.3 g/dL — ABNORMAL LOW (ref 6.5–8.1)

## 2022-08-24 LAB — CBC
HCT: 28.1 % — ABNORMAL LOW (ref 36.0–46.0)
Hemoglobin: 9.6 g/dL — ABNORMAL LOW (ref 12.0–15.0)
MCH: 33.8 pg (ref 26.0–34.0)
MCHC: 34.2 g/dL (ref 30.0–36.0)
MCV: 98.9 fL (ref 80.0–100.0)
Platelets: 117 10*3/uL — ABNORMAL LOW (ref 150–400)
RBC: 2.84 MIL/uL — ABNORMAL LOW (ref 3.87–5.11)
RDW: 11.8 % (ref 11.5–15.5)
WBC: 9.4 10*3/uL (ref 4.0–10.5)
nRBC: 0 % (ref 0.0–0.2)

## 2022-08-24 LAB — MAGNESIUM: Magnesium: 1.7 mg/dL (ref 1.7–2.4)

## 2022-08-24 MED ORDER — GLYCOPYRROLATE 0.2 MG/ML IJ SOLN
0.2000 mg | INTRAMUSCULAR | Status: DC | PRN
  Administered 2022-08-26 – 2022-08-27 (×4): 0.2 mg via INTRAVENOUS
  Filled 2022-08-24 (×4): qty 1

## 2022-08-24 MED ORDER — ONDANSETRON HCL 4 MG/2ML IJ SOLN: 4.0000 mg | Freq: Four times a day (QID) | INTRAMUSCULAR | Status: DC | PRN

## 2022-08-24 MED ORDER — GLYCOPYRROLATE 1 MG PO TABS: 1.0000 mg | ORAL_TABLET | ORAL | Status: DC | PRN

## 2022-08-24 MED ORDER — LORAZEPAM 1 MG PO TABS: 1.0000 mg | ORAL_TABLET | ORAL | Status: DC | PRN

## 2022-08-24 MED ORDER — MAGNESIUM SULFATE 2 GM/50ML IV SOLN
2.0000 g | Freq: Once | INTRAVENOUS | Status: DC
  Filled 2022-08-24: qty 50

## 2022-08-24 MED ORDER — HALOPERIDOL 0.5 MG PO TABS: 0.5000 mg | ORAL_TABLET | ORAL | Status: DC | PRN

## 2022-08-24 MED ORDER — ONDANSETRON 4 MG PO TBDP: 4.0000 mg | ORAL_TABLET | Freq: Four times a day (QID) | ORAL | Status: DC | PRN

## 2022-08-24 MED ORDER — POLYVINYL ALCOHOL 1.4 % OP SOLN: 1.0000 [drp] | Freq: Four times a day (QID) | OPHTHALMIC | Status: DC | PRN

## 2022-08-24 MED ORDER — BIOTENE DRY MOUTH MT LIQD: 15.0000 mL | OROMUCOSAL | Status: DC | PRN

## 2022-08-24 MED ORDER — MORPHINE SULFATE (PF) 2 MG/ML IV SOLN
1.0000 mg | INTRAVENOUS | Status: DC | PRN
  Administered 2022-08-24 – 2022-08-27 (×7): 1 mg via INTRAVENOUS
  Filled 2022-08-24 (×7): qty 1

## 2022-08-24 MED ORDER — POTASSIUM CHLORIDE 10 MEQ/100ML IV SOLN
10.0000 meq | INTRAVENOUS | Status: DC
  Administered 2022-08-24 (×4): 10 meq via INTRAVENOUS
  Filled 2022-08-24 (×4): qty 100

## 2022-08-24 MED ORDER — HALOPERIDOL LACTATE 2 MG/ML PO CONC: 0.5000 mg | ORAL | Status: DC | PRN

## 2022-08-24 MED ORDER — LORAZEPAM 2 MG/ML PO CONC: 1.0000 mg | ORAL | Status: DC | PRN

## 2022-08-24 MED ORDER — LORAZEPAM 2 MG/ML IJ SOLN: 1.0000 mg | INTRAMUSCULAR | Status: DC | PRN

## 2022-08-24 MED ORDER — HALOPERIDOL LACTATE 5 MG/ML IJ SOLN: 0.5000 mg | INTRAMUSCULAR | Status: DC | PRN

## 2022-08-24 MED ORDER — ACETAMINOPHEN 10 MG/ML IV SOLN
1000.0000 mg | Freq: Four times a day (QID) | INTRAVENOUS | Status: AC | PRN
  Administered 2022-08-24: 1000 mg via INTRAVENOUS
  Filled 2022-08-24: qty 100

## 2022-08-24 MED ORDER — SODIUM BICARBONATE 8.4 % IV SOLN
INTRAVENOUS | Status: DC
  Filled 2022-08-24 (×2): qty 1000

## 2022-08-24 MED ORDER — GLYCOPYRROLATE 0.2 MG/ML IJ SOLN: 0.2000 mg | INTRAMUSCULAR | Status: DC | PRN

## 2022-08-24 NOTE — Progress Notes (Addendum)
[facility] PROGRESS NOTE   Elizabeth Friedman NGE:[id number] DOB: 09/28/1941 DOA: 08/22/2022  PCP: Adin Hector, MD  Brief History/Interval Summary: 81 y.o. female with medical history significant of recurrent CVA on Eliquis, dyslipidemia, GERD, osteoarthritis, vitamin B12 deficiency, breast and uterine cancer. Patient had just recently been hospitalized at [facility] from 10/19-10/26 with early subacute infarct involving the posterior right frontal parietal region, left cerebellum, and left temporal occipital region confirmed on MRI.  There was concern for hypercoagulability in the setting malignancy due to recurrent strokes on aspirin and Plavix.  TEE was negative.  After discussions with oncology Plavix was discontinued, aspirin continued, and heparin drip transitioned to Eliquis p.o.  Patient had been sent to inpatient rehab for further treatment.  A code stroke was called on the morning of 10/28.  This was because the patient was found to have a left gaze deviation and aphasia.  Subsequently there was concern for seizure activity.  She was loaded with Keppra.  Patient was hospitalized for further management.  Neurology was consulted.  Patient was placed on long-term monitoring.  Consultants: Neurology  Procedures: Continuous EEG    Subjective/Interval History: Patient remains quite obtunded.  Eyes are closed.  Patient's son is at the bedside.      Assessment/Plan:  Acute left MCA stroke Patient was recently hospitalized at [facility] with subacute stroke. Patient was not a candidate for thrombolytics as patient was on Eliquis. Patient was on aspirin and Eliquis.  Eliquis currently on hold due to concern for hemorrhagic transformation. MRI brain revealed left MCA stroke.  Discussed with neurology who mentioned that this was a fairly large stroke. LDL was 86 on October 20.  HbA1c 5.7 on October 14. Patient underwent TEE on October 23 which did not show any shunts.  LVEF was 65 to 70%.   No thrombus was identified. Prognosis is guarded based on neurological input.  Patient remains obtunded.  Discussed with the patient's son.  Neurology to weigh in as well.  Status epilepticus Probably related to stroke.  Patient was given Ativan and loaded with Keppra.  Currently on Keppra 500 mg twice a day.  Seizure precautions.  Looks like her long-term monitoring has been discontinued.  PSVT Brief run of SVT noted on telemetry.  Noted to be regular.  No episodes of SVT noted on telemetry this morning.  TSH is pending.    Hypokalemia Will be repleted.  Magnesium is 1.7.  Will give dose of magnesium sulfate as well.  Normal anion gap metabolic acidosis Bicarbonate noted to be 15.  Will initiate sodium bicarbonate infusion.  History of endometrial and breast cancer Patient with history of endometrial mass s/p removal 20 years ago.  Patient then had right breast DCIS status postlumpectomy with radiation treatment in 2021.  Later patient underwent total laparoscopic hysterectomy with bilateral salpingo oophorectomy 12/24/2021 for which pathology report revealed carcinosarcoma.  She had been treated with chemotherapy and radiation, but family notes most recent blood noted persistent elevated CA-125 in the 40s. Looks like patient is followed by medical oncologist Dr. Janese Banks in [place].  Last seen by Dr. Janese Banks on October 11.  At that time patient was doing well with no concerning signs symptoms of recurrence of her cancer.  Patient also supposed to follow-up with GYN oncology. Does not appear that patient is on any active cancer treatment currently.  Mildly elevated transaminases AST and ALT noted to be mildly elevated this morning.  Noted to be on statin.  Will recheck  tomorrow.  Continue  Dyslipidemia On atorvastatin though she has not been getting these medicines as she remains n.p.o. due to high aspiration risk.  Bronchiectasis She was supposed to be on azithromycin for chronic bronchiectasis  for 3 months/months from Dr. Lanney Gins note 06/2022.  This had been resumed following discharge 10/26. -Consider resuming azithromycin when able   History of migraine headaches -Topamax on hold   GERD -Continue Protonix IV  Goals of care Long discussion with patient's son and daughter and granddaughter on 10/29.  They were updated on patient's clinical status.  They understand the situation.  Patient's known wishes were reviewed.  She is already a DNR.  She also would not want any artificial nutrition.  Feeding tube was discussed with family and they are certain of patient's wishes so we will not place any kind of feeding tube in this individual.  Aspiration risk was also discussed with them and they understand.  Prognosis is guarded. Discussed with son this morning.  Urology to speak with family as well. We will consult palliative care to assist with goals of care.  ADDENDUM Patient started to spike fever.  Might have aspirated.  Discussed with patient's family again.  They are now ready for patient to be transitioned to comfort care.  Discussed with nursing staff.  Neurology was informed.  We will transition to comfort.    DVT Prophylaxis: Apixaban on hold.  SCDs for now. Code Status: DNR Family Communication: No family at bedside Disposition Plan: To be determined  Status is: Inpatient Remains inpatient appropriate because: Acute stroke, seizure      Medications: Scheduled:  aspirin  81 mg Oral Daily   Or   aspirin  300 mg Rectal Daily   atorvastatin  40 mg Oral Daily   pantoprazole (PROTONIX) IV  40 mg Intravenous Q24H   Continuous:  levETIRAcetam 500 mg (08/24/22 0952)   potassium chloride 10 mEq (08/24/22 0932)   sodium bicarbonate 150 mEq in dextrose 5 % 1,150 mL infusion     QQV:ZDGLOVFIEPPIR **OR** acetaminophen (TYLENOL) oral liquid 160 mg/5 mL **OR** acetaminophen, LORazepam, metoprolol tartrate  Antibiotics: Anti-infectives (From admission, onward)    None        Objective:  Vital Signs  Vitals:   08/23/22 2017 08/24/22 0023 08/24/22 0434 08/24/22 0812  BP: (!) 164/82 (!) 159/84 (!) 155/82 (!) 156/78  Pulse: (!) 106 (!) 106 (!) 106 (!) 105  Resp: '20 20 19 18  '$ Temp: 98.4 F (36.9 C) 99.3 F (37.4 C) 98.4 F (36.9 C) 99.8 F (37.7 C)  TempSrc: Axillary Axillary Oral Oral  SpO2: 94% 92% 93% 93%    Intake/Output Summary (Last 24 hours) at 08/24/2022 1026 Last data filed at 08/24/2022 0551 Gross per 24 hour  Intake 613.93 ml  Output 1450 ml  Net -836.07 ml    There were no vitals filed for this visit.  General appearance: Obtunded Resp: Clear to auscultation bilaterally.  Normal effort Cardio: S1-S2 is normal regular.  No S3-S4.  No rubs murmurs or bruit GI: Abdomen is soft.  Nontender nondistended.  Bowel sounds are present normal.  No masses organomegaly Extremities: No edema.       Lab Results:  Data Reviewed: I have personally reviewed following labs and reports of the imaging studies  CBC: Recent Labs  Lab 08/18/22 0406 08/19/22 0443 08/20/22 0441 08/21/22 0728 08/24/22 0346  WBC 5.4 5.9 5.4 5.6 9.4  NEUTROABS 3.4  --   --  4.0  --  HGB 9.6* 9.8* 9.8* 10.9* 9.6*  HCT 28.2* 28.9* 28.9* 32.0* 28.1*  MCV 96.9 95.7 97.3 98.2 98.9  PLT 129* 140* 140* 157 117*     Basic Metabolic Panel: Recent Labs  Lab 08/19/22 0443 08/21/22 0728 08/24/22 0346  NA 138 138 138  K 3.6 3.4* 3.4*  CL 109 107 110  CO2 24 23 15*  GLUCOSE 98 129* 95  BUN '11 12 12  '$ CREATININE 0.76 1.03* 0.89  CALCIUM 9.1 9.7 8.8*  MG  --   --  1.7     GFR: Estimated Creatinine Clearance: 32.7 mL/min (by C-G formula based on SCr of 0.89 mg/dL).  Liver Function Tests: Recent Labs  Lab 08/21/22 0728 08/24/22 0346  AST 19 70*  ALT 18 59*  ALKPHOS 79 65  BILITOT 0.4 0.8  PROT 6.2* 5.3*  ALBUMIN 3.6 2.9*      Coagulation Profile: Recent Labs  Lab 08/18/22 0942  INR 1.3*      CBG: Recent Labs  Lab 08/22/22 0652   GLUCAP 98      Radiology Studies: MR BRAIN WO CONTRAST  Result Date: 08/23/2022 CLINICAL DATA:  Left gaze deviation and aphasia EXAM: MRI HEAD WITHOUT CONTRAST TECHNIQUE: Multiplanar, multiecho pulse sequences of the brain and surrounding structures were obtained without intravenous contrast. COMPARISON:  08/17/2022 FINDINGS: Brain: New area of restricted diffusion with ADC correlate in the left MCA territory, involving the left temporal lobe, posteroinferior frontal lobe, inferolateral parietal lobe, lateral occipital lobe, caudate, and lentiform nucleus (series 5, images 69-87). Increased restricted diffusion with ADC correlate in the right temporal lobe and posteroinferior frontal lobe (series 5, images 70 3-88), with significantly increased cortical involvement. Additional new scattered foci in the right frontal lobe (series 5, image 83 and 84), left occipital lobe (series 5, image 69),and right occipital lobe (series 5, images 60-62). These areas associated with T2 hyperintense signal, likely cytotoxic edema, and mild gyral swelling. No definite hemosiderin deposition to suggest petechial hemorrhage. No mass, midline shift, hydrocephalus, or extra-axial collection. Vascular: Normal arterial flow voids. Skull and upper cervical spine: Normal marrow signal. Sinuses/Orbits: Clear paranasal sinuses. Status post bilateral lens replacements. Other: The mastoids are well aerated. IMPRESSION: 1. Acute infarction in the left MCA territory, involving the left temporal lobe, posteroinferior frontal lobe, inferolateral parietal lobe, lateral occipital lobe, caudate, and lentiform nucleus. 2. Further interval extension of previously noted infarct in the right MCA territory. No evidence of hemorrhage or significant mass effect. These results will be called to the ordering clinician or representative by the Radiologist Assistant, and communication documented in the PACS or [employer]. Electronically Signed    By: Merilyn Baba M.D.   On: 08/23/2022 23:52   Overnight EEG with video  Result Date: 08/22/2022 Lora Havens, MD     08/23/2022  8:35 PM Patient Name: Elizabeth Friedman MRN: [id number] Epilepsy Attending: Lora Havens Referring Physician/Provider: Derek Jack, MD Duration: 08/22/2022 1007 to 08/23/2022 1007  Patient history: 81 y.o. female  with medical hx of previous CVA (10/13), HLD, B12 deficiency, breast and uterine cancer, stage 1 uterine carcinosarcoma. On Eliquis (apixaban) daily. Now complaining of left sided gaze, aphasia, and worsening left sided deficits. EEEG to evaluate for seizure  Level of alertness: awake, asleep  AEDs during EEG study: LEV  Technical aspects: This EEG study was done with scalp electrodes positioned according to the 10-20 International system of electrode placement. Electrical activity was reviewed with band pass filter of 1-'70Hz'$ , sensitivity of  7 uV/mm, display speed of 51m/sec with a '60Hz'$  notched filter applied as appropriate. EEG data were recorded continuously and digitally stored.  Video monitoring was available and reviewed as appropriate.  Description: No clear posterior dominant rhythm was seen. Sleep was characterized by sleep spindles (12-14 Hz), maximal fronto-central region. EEG showed continuous generalized and lateralized right hemisphere 3 to 6 Hz theta-delta slowing admixed with 13-'15Hz'$  frontocentral region. Sharp transients were noted in left frontal region. Hyperventilation and photic stimulation were not performed.    ABNORMALITY - Continuous slow, generalized and lateralized right hemisphere  IMPRESSION: This study is suggestive of cortical dysfunction in right hemisphere likely secondary to underlying stroke. Additionally there is severe diffuse encephalopathy, nonspecific etiology. No seizures or epileptiform discharges were seen throughout the recording.   [place]       LOS: 2 days   Jeriel Vivanco KCarMaxPager on  [email or web address]  08/24/2022, 10:26 AM

## 2022-08-24 NOTE — Progress Notes (Signed)
  Transition of Care [facility]) Screening Note   Patient Details  Name: Elizabeth Friedman Date of Birth: [birth year]   Transition of Care [facility]) CM/SW Contact:    Pollie Friar, RN Phone Number: 08/24/2022, 12:45 PM   Pt was discharged from CIR back to hospital. CIR is following. Transition of Care Department [facility]) has reviewed patient. We will continue to monitor patient advancement through interdisciplinary progression rounds. If new patient transition needs arise, please place a TOC consult.

## 2022-08-24 NOTE — Progress Notes (Signed)
MD notified.   08/24/22 1100  Vitals  Temp (!) 101.1 F (38.4 C)  Temp Source Axillary  BP (!) 168/92  MAP (mmHg) 111  BP Location Left Arm  BP Method Automatic  Patient Position (if appropriate) Lying  Pulse Rate (!) 115  Pulse Rate Source Dinamap  ECG Heart Rate (!) 118  Resp 20  MEWS COLOR  MEWS Score Color Red  Oxygen Therapy  SpO2 91 %  MEWS Score  MEWS Temp 1  MEWS Systolic 0  MEWS Pulse 2  MEWS RR 0  MEWS LOC 2  MEWS Score 5

## 2022-08-24 NOTE — Progress Notes (Signed)
Inpatient Rehabilitation Care Coordinator Discharge Note   Patient Details  Name: Elizabeth Friedman MRN: [id number] Date of Birth: [birth year]   Discharge location: Acute  Length of Stay:    Discharge activity level:    Home/community participation:    Patient response JF:HLKTGY Literacy - How often do you need to have someone help you when you read instructions, pamphlets, or other written material from your doctor or pharmacy?: Patient unable to respond  Patient response BW:LSLHTD Isolation - How often do you feel lonely or isolated from those around you?: Patient unable to respond  Services provided included: MD, RD, PT, OT, SLP, RN, CM, Pharmacy, TR, SW  Financial Services:  Charity fundraiser Utilized: [employer]  Choices offered to/list presented to: patient and daughter  Follow-up services arranged:              Patient response to transportation need: Is the patient able to respond to transportation needs?: No In the past 12 months, has lack of transportation kept you from medical appointments or from getting medications?: No In the past 12 months, has lack of transportation kept you from meetings, work, or from getting things needed for daily living?: No    Comments (or additional information):  Patient/Family verbalized understanding of follow-up arrangements:     Individual responsible for coordination of the follow-up plan:    Confirmed correct DME delivered: Dyanne Iha 08/24/2022    Dyanne Iha

## 2022-08-24 NOTE — Progress Notes (Signed)
Physical Therapy Discharge Patient Details Name: Elizabeth Friedman MRN: [id number] DOB: 04/27/1941 Today's Date: 08/24/2022 Time:  -     Patient discharged from PT services secondary to  family has decided to pursue comfort care .  Please see latest therapy progress note for current level of functioning and progress toward goals.    Progress and discharge plan discussed with patient and/or caregiver: Patient unable to participate in discharge planning and no caregivers available  GP     Arby Barrette, [place] Doolittle 08/24/2022, 1:57 PM

## 2022-08-24 NOTE — TOC CAGE-AID Note (Signed)
Transition of Care [facility]) - CAGE-AID Screening   Patient Details  Name: Elizabeth Friedman MRN: [id number] Date of Birth: 07-09-1941  Transition of Care [facility]) CM/SW Contact:    Coralee Pesa, [place] Phone Number: 08/24/2022, 11:04 AM   Clinical Narrative:  Per chart review, pt is minimally responsive and not appropriate for CAGE- AID assessment.  CAGE-AID Screening: Substance Abuse Screening unable to be completed due to: : Patient unable to participate

## 2022-08-24 NOTE — Progress Notes (Signed)
SLP Cancellation Note  Patient Details Name: Elizabeth Friedman MRN: [id number] DOB: 04/03/1941   Cancelled treatment:   Pt remains poorly responsive. Her son and daughter are at the bedside.  SLP will follow for appropriateness for swallow and speech evaluations.  Griselda Tosh L. Tivis Ringer, MA CCC/SLP Clinical Specialist - Acute Care SLP Acute Rehabilitation Services Office number [phone]         Juan Quam Laurice 08/24/2022, 10:33 AM

## 2022-08-24 NOTE — Plan of Care (Signed)
  Problem: Education: Goal: Knowledge of disease or condition will improve Outcome: Progressing Goal: Knowledge of secondary prevention will improve (MUST DOCUMENT ALL) Outcome: Progressing   Problem: Ischemic Stroke/TIA Tissue Perfusion: Goal: Complications of ischemic stroke/TIA will be minimized Outcome: Progressing   Problem: Self-Care: Goal: Ability to participate in self-care as condition permits will improve Outcome: Progressing

## 2022-08-24 NOTE — Progress Notes (Addendum)
STROKE TEAM PROGRESS NOTE   ATTENDING NOTE: I reviewed above note and agree with the assessment and plan. Pt was seen and examined.   81 year old female with history of breast cancer, uterus cancer, HLD, migraine admitted on 10/13 for vertigo and MRI showed multiple punctate bilateral embolic infarcts.  DVT negative, 2D echo unremarkable.  A1c 5.7, LDL 94.  Put on DAPT and Lipitor 40.  She was readmitted 10/20 to 10/25 and MRI showed progressive embolic strokes bilaterally.  TEE unremarkable. CTA chest negative for PE.  CTA 125 elevated at 46.2 compared with 3 months ago 12.6.  D-dimer 10.2.  Hypercoagulable labs ordered by oncology.  LDL 86.  Put on Eliquis 5 mg bid as well as aspirin 81.  Patient was sent to CIR.  However, she was sent back on 10/28 with left gaze, aphasia and right-sided body shaking concerning for seizure status post Ativan and Keppra load.  CTA head and neck showed left M2/M3 occlusion.  MRI showed large left MCA and moderate right MCA infarcts.  EEG showed diffuse slowing and more on the right hemisphere.  On exam, patient son and daughter-in-law are at bedside.  Patient lying in bed, barely open eyes on voice, nonverbal, not following commands.  With forced eye opening, patient actively resists eye opening, however not blinking to visual threat, eyes midline, sluggish to light, left facial droop with grimacing.  Right upper extremity flaccid on pain stimulation but patient has spontaneous movement with right upper extremity and purposeful.  Withdrawal to all other extremities on pain stimulation.  Had long discussion with son and daughter-in-law, reviewed neuro images together, and discussed all possible appropriate gnosis given bilateral involvement, advanced age and large infarcts.  Family expressed patient wishes not being live in debilitated state.  Family willing to start palliative care/hospice process.  Discussed with Dr. [place] Pink.  For detailed assessment and plan, please  refer to above/below as I have made changes wherever appropriate.   Neurology will sign off. Please call with questions. Thanks for the consult.   Rosalin Hawking, MD PhD Stroke Neurology 08/24/2022 9:37 PM    INTERVAL HISTORY Her son is at the bedside. Patient continues to be somnolent on assessment. Discussed with the family regarding poor prognosis due to most recent stroke. They are in agreement to proceed with comfort care.  Vitals:   08/23/22 2017 08/24/22 0023 08/24/22 0434 08/24/22 0812  BP: (!) 164/82 (!) 159/84 (!) 155/82 (!) 156/78  Pulse: (!) 106 (!) 106 (!) 106 (!) 105  Resp: '20 20 19 18  '$ Temp: 98.4 F (36.9 C) 99.3 F (37.4 C) 98.4 F (36.9 C) 99.8 F (37.7 C)  TempSrc: Axillary Axillary Oral Oral  SpO2: 94% 92% 93% 93%   CBC:  Recent Labs  Lab 08/18/22 0406 08/19/22 0443 08/21/22 0728 08/24/22 0346  WBC 5.4   < > 5.6 9.4  NEUTROABS 3.4  --  4.0  --   HGB 9.6*   < > 10.9* 9.6*  HCT 28.2*   < > 32.0* 28.1*  MCV 96.9   < > 98.2 98.9  PLT 129*   < > 157 117*   < > = values in this interval not displayed.   Basic Metabolic Panel:  Recent Labs  Lab 08/21/22 0728 08/24/22 0346  NA 138 138  K 3.4* 3.4*  CL 107 110  CO2 23 15*  GLUCOSE 129* 95  BUN 12 12  CREATININE 1.03* 0.89  CALCIUM 9.7 8.8*  MG  --  1.7  Lipid Panel: LDL 86 HgbA1c: 5.7 Urine Drug Screen: No results for input(s): "LABOPIA", "COCAINSCRNUR", "LABBENZ", "AMPHETMU", "THCU", "LABBARB" in the last 168 hours.  Alcohol Level No results for input(s): "ETH" in the last 168 hours.  IMAGING past 24 hours MR BRAIN WO CONTRAST  Result Date: 08/23/2022 CLINICAL DATA:  Left gaze deviation and aphasia EXAM: MRI HEAD WITHOUT CONTRAST TECHNIQUE: Multiplanar, multiecho pulse sequences of the brain and surrounding structures were obtained without intravenous contrast. COMPARISON:  08/17/2022 FINDINGS: Brain: New area of restricted diffusion with ADC correlate in the left MCA territory, involving the  left temporal lobe, posteroinferior frontal lobe, inferolateral parietal lobe, lateral occipital lobe, caudate, and lentiform nucleus (series 5, images 69-87). Increased restricted diffusion with ADC correlate in the right temporal lobe and posteroinferior frontal lobe (series 5, images 70 3-88), with significantly increased cortical involvement. Additional new scattered foci in the right frontal lobe (series 5, image 83 and 84), left occipital lobe (series 5, image 69),and right occipital lobe (series 5, images 60-62). These areas associated with T2 hyperintense signal, likely cytotoxic edema, and mild gyral swelling. No definite hemosiderin deposition to suggest petechial hemorrhage. No mass, midline shift, hydrocephalus, or extra-axial collection. Vascular: Normal arterial flow voids. Skull and upper cervical spine: Normal marrow signal. Sinuses/Orbits: Clear paranasal sinuses. Status post bilateral lens replacements. Other: The mastoids are well aerated. IMPRESSION: 1. Acute infarction in the left MCA territory, involving the left temporal lobe, posteroinferior frontal lobe, inferolateral parietal lobe, lateral occipital lobe, caudate, and lentiform nucleus. 2. Further interval extension of previously noted infarct in the right MCA territory. No evidence of hemorrhage or significant mass effect. These results will be called to the ordering clinician or representative by the Radiologist Assistant, and communication documented in the PACS or [employer]. Electronically Signed   By: Merilyn Baba M.D.   On: 08/23/2022 23:52    PHYSICAL EXAM  General: Pleasant, well-appearing  in bed. No acute distress. Skin: Warm and dry. No obvious rash or lesions. Neuro: pupils are equal, round and reactive. Eyes are sluggish to respond with light. All extremities withdraw to pain except RUE. Psych: Normal mood and affect   ASSESSMENT/PLAN 81 yo woman with hx breast cancer, carcinosarcoma of uterus, R breast,  hyperlipidemia, migraine admitted for embolic strokes in multiple vascular territories with the largest in the right MCA.  Stroke:  left MCA Etiology:  large vessel occlusion  CT head Acute posterior Left MCA territory infarct affecting the M3 segment. ASPECTS 9. CTA head & neck Positive for emergent Left MCA branch occlusion, very proximal Left M3 segment of the dominant posterior left M2 division. CT perfusion CTP estimates a larger infarct core (40 mL) relative to the ASPECTS of 9. And conspicuous Tmax>10s in the posterior Left MCA territory suggests whatever core is present might be rapidly expanding. MRI Acute infarction in the left MCA territory, involving the left temporal lobe, posteroinferior frontal lobe, inferolateral parietal lobe, lateral occipital lobe, caudate, and lentiform nucleus. Further interval extension of previously noted infarct in the right MCA territory 2D Echo EF 65-70%, no atrial shunt present EEG severe encephalopathy LDL 86 HgbA1c 5.7 VTE prophylaxis - none    Diet   Diet NPO time specified   aspirin 81 mg daily and Eliquis (apixaban) daily prior to admission, now on aspirin 81 mg daily. Patient will proceed to comfort care.  Therapy recommendations:  none Disposition:  comfort care  Hypertension Home meds:  norvasc Stable Permissive hypertension (OK if < 220/120) but gradually normalize  in 5-7 days Long-term BP goal normotensive  Hyperlipidemia Home meds:  lipitor 40 mg, resumed in hospital LDL 86, goal < 70  Prediabetes Home meds:  none HgbA1c 5.7, goal < 7.0 CBGs Recent Labs    08/22/22 0652  GLUCAP 98     Other Stroke Risk Factors Advanced Age >/= 48  Hx stroke/TIA Migraines   Other Active Problems  Seizure Received Ativan 4 mg IV Loaded with Keppra 60 mg/kg  Hospital day # 2  Coralyn Pear, MD PGY-1 Psychiatry    To contact Stroke Continuity provider, please refer to [email or web address] After hours, contact General Neurology

## 2022-08-25 DIAGNOSIS — I6340 Cerebral infarction due to embolism of unspecified cerebral artery: Secondary | ICD-10-CM | POA: Diagnosis not present

## 2022-08-25 NOTE — Care Management Important Message (Signed)
Important Message  Patient Details  Name: Elizabeth Friedman MRN: [id number] Date of Birth: 07/23/1941   Medicare Important Message Given:  Yes     [name] 08/25/2022, 3:13 PM

## 2022-08-25 NOTE — Progress Notes (Signed)
[facility] PROGRESS NOTE   Elizabeth Friedman FGH:[id number] DOB: Jan 19, 1941 DOA: 08/22/2022  PCP: Adin Hector, MD  Brief History/Interval Summary: 81 y.o. female with medical history significant of recurrent CVA on Eliquis, dyslipidemia, GERD, osteoarthritis, vitamin B12 deficiency, breast and uterine cancer. Patient had just recently been hospitalized at [facility] from 10/19-10/26 with early subacute infarct involving the posterior right frontal parietal region, left cerebellum, and left temporal occipital region confirmed on MRI.  There was concern for hypercoagulability in the setting malignancy due to recurrent strokes on aspirin and Plavix.  TEE was negative.  After discussions with oncology Plavix was discontinued, aspirin continued, and heparin drip transitioned to Eliquis p.o.  Patient had been sent to inpatient rehab for further treatment.  A code stroke was called on the morning of 10/28.  This was because the patient was found to have a left gaze deviation and aphasia.  Subsequently there was concern for seizure activity.  She was loaded with Keppra.  Patient was hospitalized for further management.  Neurology was consulted.  Patient was placed on long-term monitoring.  Consultants: Neurology  Procedures: Continuous EEG    Subjective/Interval History: Patient is unresponsive.     Assessment/Plan:  Acute left MCA stroke Patient was recently hospitalized at [facility] with subacute stroke. Patient was not a candidate for thrombolytics as patient was on Eliquis. Patient was on aspirin and Eliquis.  Eliquis currently on hold due to concern for hemorrhagic transformation. MRI brain revealed left MCA stroke.  Discussed with neurology who mentioned that this was a fairly large stroke. LDL was 86 on October 20.  HbA1c 5.7 on October 14. Patient underwent TEE on October 23 which did not show any shunts.  LVEF was 65 to 70%.  No thrombus was identified. Neurological prognosis is poor  per neurology.  This was communicated to patient's family.  Patient was subsequently transitioned to comfort care.  Status epilepticus Probably related to stroke.  Patient was given Ativan and loaded with Keppra.  Now comfort care.  Antiepileptics have been discontinued.  PSVT Brief run of SVT noted on telemetry.    Hypokalemia  Normal anion gap metabolic acidosis  History of endometrial and breast cancer Patient with history of endometrial mass s/p removal 20 years ago.  Patient then had right breast DCIS status postlumpectomy with radiation treatment in 2021.  Later patient underwent total laparoscopic hysterectomy with bilateral salpingo oophorectomy 12/24/2021 for which pathology report revealed carcinosarcoma.  She had been treated with chemotherapy and radiation, but family notes most recent blood noted persistent elevated CA-125 in the 40s. Looks like patient is followed by medical oncologist Dr. Janese Banks in [place].  Last seen by Dr. Janese Banks on October 11.  Does not appear that patient is on any active cancer treatment currently.  Mildly elevated transaminases Dyslipidemia  Bronchiectasis She was supposed to be on azithromycin for chronic bronchiectasis for 3 months/months from Dr. Lanney Gins note 06/2022.     History of migraine headaches -Topamax on hold   GERD -Continue Protonix IV  Goals of care See previous notes as well.  Patient transition to comfort care on 08/24/2022. Anticipate in-hospital death.   DVT Prophylaxis: Comfort care Code Status: DNR Family Communication: No family at bedside Disposition Plan: Anticipate in-hospital death.  Status is: Inpatient Remains inpatient appropriate because: Acute stroke, seizure      Medications: Scheduled:   Continuous:  acetaminophen Stopped (08/24/22 1331)   CVE:[id number], antiseptic oral rinse, glycopyrrolate **OR** glycopyrrolate **OR** glycopyrrolate, haloperidol **OR** haloperidol **OR**  haloperidol lactate,  LORazepam **OR** LORazepam **OR** LORazepam, morphine injection, ondansetron **OR** ondansetron (ZOFRAN) IV, polyvinyl alcohol  Antibiotics: Anti-infectives (From admission, onward)    None       Objective:  Vital Signs  Vitals:   08/24/22 0812 08/24/22 1100 08/24/22 2000 08/25/22 0804  BP: (!) 156/78 (!) 168/92 137/78 137/77  Pulse: (!) 105 (!) 115 (!) 106 (!) 121  Resp: '18 20 20 20  '$ Temp: 99.8 F (37.7 C) (!) 101.1 F (38.4 C) (!) 100.6 F (38.1 C) (!) 100.6 F (38.1 C)  TempSrc: Oral Axillary Axillary Axillary  SpO2: 93% 91% 90% 90%    Intake/Output Summary (Last 24 hours) at 08/25/2022 1119 Last data filed at 08/24/2022 2126 Gross per 24 hour  Intake 884.84 ml  Output --  Net 884.84 ml    There were no vitals filed for this visit.  Unresponsive.  Noted to be mildly.  No grimacing noted.   Lab Results:  Data Reviewed: I have personally reviewed following labs and reports of the imaging studies  CBC: Recent Labs  Lab 08/19/22 0443 08/20/22 0441 08/21/22 0728 08/24/22 0346  WBC 5.9 5.4 5.6 9.4  NEUTROABS  --   --  4.0  --   HGB 9.8* 9.8* 10.9* 9.6*  HCT 28.9* 28.9* 32.0* 28.1*  MCV 95.7 97.3 98.2 98.9  PLT 140* 140* 157 117*     Basic Metabolic Panel: Recent Labs  Lab 08/19/22 0443 08/21/22 0728 08/24/22 0346  NA 138 138 138  K 3.6 3.4* 3.4*  CL 109 107 110  CO2 24 23 15*  GLUCOSE 98 129* 95  BUN '11 12 12  '$ CREATININE 0.76 1.03* 0.89  CALCIUM 9.1 9.7 8.8*  MG  --   --  1.7     GFR: Estimated Creatinine Clearance: 32.7 mL/min (by C-G formula based on SCr of 0.89 mg/dL).  Liver Function Tests: Recent Labs  Lab 08/21/22 0728 08/24/22 0346  AST 19 70*  ALT 18 59*  ALKPHOS 79 65  BILITOT 0.4 0.8  PROT 6.2* 5.3*  ALBUMIN 3.6 2.9*      CBG: Recent Labs  Lab 08/22/22 0652  GLUCAP 98      Radiology Studies: MR BRAIN WO CONTRAST  Result Date: 08/23/2022 CLINICAL DATA:  Left gaze deviation and aphasia EXAM: MRI HEAD  WITHOUT CONTRAST TECHNIQUE: Multiplanar, multiecho pulse sequences of the brain and surrounding structures were obtained without intravenous contrast. COMPARISON:  08/17/2022 FINDINGS: Brain: New area of restricted diffusion with ADC correlate in the left MCA territory, involving the left temporal lobe, posteroinferior frontal lobe, inferolateral parietal lobe, lateral occipital lobe, caudate, and lentiform nucleus (series 5, images 69-87). Increased restricted diffusion with ADC correlate in the right temporal lobe and posteroinferior frontal lobe (series 5, images 70 3-88), with significantly increased cortical involvement. Additional new scattered foci in the right frontal lobe (series 5, image 83 and 84), left occipital lobe (series 5, image 69),and right occipital lobe (series 5, images 60-62). These areas associated with T2 hyperintense signal, likely cytotoxic edema, and mild gyral swelling. No definite hemosiderin deposition to suggest petechial hemorrhage. No mass, midline shift, hydrocephalus, or extra-axial collection. Vascular: Normal arterial flow voids. Skull and upper cervical spine: Normal marrow signal. Sinuses/Orbits: Clear paranasal sinuses. Status post bilateral lens replacements. Other: The mastoids are well aerated. IMPRESSION: 1. Acute infarction in the left MCA territory, involving the left temporal lobe, posteroinferior frontal lobe, inferolateral parietal lobe, lateral occipital lobe, caudate, and lentiform nucleus. 2. Further interval extension of  previously noted infarct in the right MCA territory. No evidence of hemorrhage or significant mass effect. These results will be called to the ordering clinician or representative by the Radiologist Assistant, and communication documented in the PACS or [employer]. Electronically Signed   By: Merilyn Baba M.D.   On: 08/23/2022 23:52       LOS: 3 days   [facility] Pager on [email or web address]  08/25/2022, 11:19  AM

## 2022-08-26 DIAGNOSIS — I639 Cerebral infarction, unspecified: Secondary | ICD-10-CM | POA: Diagnosis not present

## 2022-08-26 MED ORDER — ACETAMINOPHEN 650 MG RE SUPP
650.0000 mg | RECTAL | Status: DC | PRN
  Administered 2022-08-26: 650 mg via RECTAL
  Filled 2022-08-26: qty 1

## 2022-08-26 NOTE — Progress Notes (Signed)
[facility] PROGRESS NOTE   Elizabeth Friedman KYH:[id number] DOB: 07-11-1941 DOA: 08/22/2022  PCP: Adin Hector, MD  Brief History/Interval Summary: 81 y.o. female with medical history significant of recurrent CVA on Eliquis, dyslipidemia, GERD, osteoarthritis, vitamin B12 deficiency, breast and uterine cancer. Patient had just recently been hospitalized at [facility] from 10/19-10/26 with early subacute infarct involving the posterior right frontal parietal region, left cerebellum, and left temporal occipital region confirmed on MRI.  There was concern for hypercoagulability in the setting malignancy due to recurrent strokes on aspirin and Plavix.  TEE was negative.  After discussions with oncology Plavix was discontinued, aspirin continued, and heparin drip transitioned to Eliquis p.o.  Patient had been sent to inpatient rehab for further treatment.   A code stroke was called on the morning of 10/28.  This was because the patient was found to have a left gaze deviation and aphasia.  Subsequently there was concern for seizure activity.  She was loaded with Keppra.  Patient was hospitalized for further management.  Neurology was consulted.  Patient was placed on long-term monitoring.  Continue comfort measures per previous discussion -discussed hospice house with family today, they are interested in following up with palliative care to further discuss possible disposition given their large family and multiple grandchildren visiting the hospital is somewhat taxing on the family.  Consultants: Neurology  Procedures: Continuous EEG  Subjective/Interval History: Patient is unresponsive.  Review of systems limited  Assessment/Plan:  Goals of care See previous notes as well.  Patient transition to comfort care on 08/24/2022. Anticipate in-hospital death.  Acute large left MCA stroke Patient was recently hospitalized at [facility] with subacute stroke. Patient was not a candidate for  thrombolytics as patient was on Eliquis. Patient was on aspirin and Eliquis.  Eliquis currently on hold due to concern for hemorrhagic transformation. MRI brain revealed left MCA stroke.  Discussed with neurology who mentioned that this was a fairly large stroke. LDL was 86 on October 20.  HbA1c 5.7 on October 14. Patient underwent TEE on October 23 which did not show any shunts.  LVEF was 65 to 70%.  No thrombus was identified. Neurological prognosis is poor per neurology.  This was communicated to patient's family.  Patient was subsequently transitioned to comfort care.  Status epilepticus Probably related to stroke.  Patient was given Ativan and loaded with Keppra.  Now comfort care.  Antiepileptics have been discontinued.  PSVT Brief run of SVT noted on telemetry.    Hypokalemia  Normal anion gap metabolic acidosis  History of endometrial and breast cancer Patient with history of endometrial mass s/p removal 20 years ago.  Patient then had right breast DCIS status postlumpectomy with radiation treatment in 2021.  Later patient underwent total laparoscopic hysterectomy with bilateral salpingo oophorectomy 12/24/2021 for which pathology report revealed carcinosarcoma.  She had been treated with chemotherapy and radiation, but family notes most recent blood noted persistent elevated CA-125 in the 40s. Looks like patient is followed by medical oncologist Dr. Janese Banks in [place].  Last seen by Dr. Janese Banks on October 11.  Does not appear that patient is on any active cancer treatment currently.  Mildly elevated transaminases Dyslipidemia  Bronchiectasis She was supposed to be on azithromycin for chronic bronchiectasis for 3 months/months from Dr. Lanney Gins note 06/2022.     History of migraine headaches -Topamax on hold   GERD -Continue Protonix IV  DVT Prophylaxis: Comfort care Code Status: DNR Family Communication: No family at bedside Disposition Plan:  Anticipate in-hospital  death.  Status is: Inpatient Remains inpatient appropriate because: Acute stroke, seizure      Medications: Scheduled:   Continuous:   RVI:[id number] oral rinse, glycopyrrolate **OR** glycopyrrolate **OR** glycopyrrolate, haloperidol **OR** haloperidol **OR** haloperidol lactate, LORazepam **OR** LORazepam **OR** LORazepam, morphine injection, ondansetron **OR** ondansetron (ZOFRAN) IV, polyvinyl alcohol  Antibiotics: Anti-infectives (From admission, onward)    None       Objective:  Vital Signs  Vitals:   08/24/22 1100 08/24/22 2000 08/25/22 0804 08/25/22 1952  BP: (!) 168/92 137/78 137/77 (!) 141/93  Pulse: (!) 115 (!) 106 (!) 121 (!) 122  Resp: '20 20 20 15  '$ Temp: (!) 101.1 F (38.4 C) (!) 100.6 F (38.1 C) (!) 100.6 F (38.1 C) 98.4 F (36.9 C)  TempSrc: Axillary Axillary Axillary Oral  SpO2: 91% 90% 90% 91%    Intake/Output Summary (Last 24 hours) at 08/26/2022 0747 Last data filed at 08/25/2022 1214 Gross per 24 hour  Intake --  Output 600 ml  Net -600 ml    There were no vitals filed for this visit.  Unresponsive.  Noted to be mildly.  No grimacing noted.   Lab Results:  Data Reviewed: I have personally reviewed following labs and reports of the imaging studies  CBC: Recent Labs  Lab 08/20/22 0441 08/21/22 0728 08/24/22 0346  WBC 5.4 5.6 9.4  NEUTROABS  --  4.0  --   HGB 9.8* 10.9* 9.6*  HCT 28.9* 32.0* 28.1*  MCV 97.3 98.2 98.9  PLT 140* 157 117*     Basic Metabolic Panel: Recent Labs  Lab 08/21/22 0728 08/24/22 0346  NA 138 138  K 3.4* 3.4*  CL 107 110  CO2 23 15*  GLUCOSE 129* 95  BUN 12 12  CREATININE 1.03* 0.89  CALCIUM 9.7 8.8*  MG  --  1.7     GFR: Estimated Creatinine Clearance: 32.7 mL/min (by C-G formula based on SCr of 0.89 mg/dL).  Liver Function Tests: Recent Labs  Lab 08/21/22 0728 08/24/22 0346  AST 19 70*  ALT 18 59*  ALKPHOS 79 65  BILITOT 0.4 0.8  PROT 6.2* 5.3*  ALBUMIN 3.6 2.9*       CBG: Recent Labs  Lab 08/22/22 0652  GLUCAP 98      Radiology Studies: No results found.     LOS: 4 days   Little Ishikawa  [facility] Pager on [email or web address]  08/26/2022, 7:47 AM

## 2022-08-26 NOTE — TOC Initial Note (Signed)
Transition of Care [facility]) - Initial/Assessment Note    Patient Details  Name: Elizabeth Friedman MRN: [id number] Date of Birth: 1941-08-26  Transition of Care [facility]) CM/SW Contact:    Coralee Pesa, [place] Phone Number: 08/26/2022, 3:42 PM  Clinical Narrative:                 CSW was notified by medical team that family is interested in discussing transfer to residential hospice. CSW met with family in the room. Son states a preference for [facility] in [place], as they are familiar with it. CSW confirmed with facility they are currently taking referrals, fax was sent, being reviewed. TOC will continue to follow for DC planning.  Expected Discharge Plan: Hospice Medical Facility Barriers to Discharge: Other (must enter comment) (Hospice reviewing for admission)   Patient Goals and CMS Choice Patient states their goals for this hospitalization and ongoing recovery are:: Pt unresponsive and unable to participate in goal setting. CMS Medicare.gov Compare Post Acute Care list provided to:: Patient Represenative (must comment) Choice offered to / list presented to : Adult Children  Expected Discharge Plan and Services Expected Discharge Plan: [place]     Post Acute Care Choice: Residential Hospice Bed Living arrangements for the past 2 months: Single Family Home                                      Prior Living Arrangements/Services Living arrangements for the past 2 months: Single Family Home Lives with:: Self Patient language and need for interpreter reviewed:: Yes Do you feel safe going back to the place where you live?: Yes      Need for Family Participation in Patient Care: Yes (Comment) Care giver support system in place?: Yes (comment)   Criminal Activity/Legal Involvement Pertinent to Current Situation/Hospitalization: No - Comment as needed  Activities of Daily Living      Permission Sought/Granted Permission sought to share information with :  Family Supports Permission granted to share information with : Yes, Verbal Permission Granted  Share Information with NAME: Deland Pretty     Permission granted to share info w Relationship: Son  Permission granted to share info w Contact Information: [phone]  Emotional Assessment Appearance:: Appears stated age Attitude/Demeanor/Rapport: Unable to Assess Affect (typically observed): Unable to Assess Orientation: :  (Unresponsive) Alcohol / Substance Use: Not Applicable Psych Involvement: No (comment)  Admission diagnosis:  CVA (cerebral vascular accident) [facility]) [I63.9] Patient Active Problem List   Diagnosis Date Noted   Seizure ([place]) 08/22/2022   History of endometrial cancer 08/22/2022   History of migraine headaches 08/22/2022   Protein-calorie malnutrition, severe ([place]) 08/22/2022   Acute ischemic right MCA stroke ([place]) 08/20/2022   Acute CVA (cerebrovascular accident) ([place]) 08/14/2022   Dyslipidemia 08/14/2022   GERD without esophagitis 08/14/2022   Migraine 08/14/2022   CVA (cerebral vascular accident) ([place]) 08/14/2022   Paresthesias    Elevated troponin I measurement 08/08/2022   H/O: GI bleed 08/08/2022   Dizziness 08/07/2022   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[place]) 01/23/2022   Postoperative state 12/24/2021   Endometrial cancer ([place])    Bronchiectasis without complication ([place]) 93/79/0240   Lung granuloma ([place]) 12/10/2021   B12 deficiency 09/25/2021   History of ductal carcinoma in situ (DCIS) of breast 09/10/2020   Iron deficiency anemia 09/10/2020   Ductal carcinoma in situ (DCIS) of right breast 05/17/2020   Bilateral carotid artery  stenosis 02/06/2020   Visual changes 01/12/2020   Carotid stenosis 01/11/2020   Migraine aura without headache 03/07/2019   Complete tear of left rotator cuff 03/10/2017   Pessary maintenance 12/18/2016   Rectocele 12/18/2016   Midline cystocele 12/18/2016   Menopausal syndrome on hormone replacement therapy 12/18/2016    Allergic rhinitis 12/17/2015   Atypical chest pain 12/17/2015   Bloodgood disease 12/17/2015   Acid reflux 12/17/2015   HLD (hyperlipidemia) 12/17/2015   Lung mass 12/17/2015   Uterine prolapse 12/17/2015   CA of skin 12/17/2015   Post menopausal syndrome 01/14/2015   Incomplete bladder emptying 01/14/2015   Neuralgia neuritis, sciatic nerve 01/14/2015   H/O malignant neoplasm of skin 09/17/2014   PCP:  Adin Hector, MD Pharmacy:   [facility] [address] ([address]), [place] [address] [place]) [place] [address] Phone: [phone] Fax: [phone]     Social Determinants of Health (SDOH) Interventions    Readmission Risk Interventions    08/18/2022    4:24 PM  Readmission Risk Prevention Plan  Transportation Screening Complete  PCP or Specialist Appt within 3-5 Days Complete  HRI or [place] Complete  Social Work Consult for [place] Planning/Counseling Complete  Palliative Care Screening Complete  Medication Review Press photographer) Complete

## 2022-08-26 NOTE — Progress Notes (Signed)
Patient Elizabeth Friedman      DOB: 1941-10-17      MBT:[id number]      Palliative Medicine Team    Subjective: Bedside symptom check completed. No family or visitors present at this time.    Physical exam: Patient resting in bed with eyes closed at time of visit. Breathing even and non-labored on room air, no excessive secretions noted. Patient without physical or non-verbal signs of pain or discomfort at this time. Patient did not acknowledge this RN with verbal introduction and light tactile stimulation, did not attempt to further stimulate, to promote comfort. Patient with warm extremities to touch, palpable distal pulses.    Assessment and plan: This RN called son, Elizabeth Friedman, who shared he and his family had been well supported by staff on 3W and the LCSW Janett Billow, who was working on referral to [facility] in [place]. All questions answered at this time. This RN available to assist with transfer process as needed, patient appears stable for transfer this afternoon. Bedside RN without needs or concerns. Will continue to follow for any changes or advances.    Thank you for allowing the Palliative Medicine Team to assist in the care of this patient.     Damian Leavell, MSN, [place] Palliative Medicine Team Team Phone: [phone]  This phone is monitored 7a-7p, please reach out to attending physician outside of these hours for urgent needs.

## 2022-08-27 ENCOUNTER — Other Ambulatory Visit: Payer: PPO

## 2022-08-27 ENCOUNTER — Inpatient Hospital Stay: Payer: PPO | Admitting: Nurse Practitioner

## 2022-08-27 DIAGNOSIS — I6340 Cerebral infarction due to embolism of unspecified cerebral artery: Secondary | ICD-10-CM | POA: Diagnosis not present

## 2022-08-27 MED ORDER — ONDANSETRON 4 MG PO TBDP: 4.0000 mg | ORAL_TABLET | Freq: Four times a day (QID) | ORAL | 0 refills | Status: AC | PRN

## 2022-08-27 MED ORDER — MORPHINE SULFATE (CONCENTRATE) 10 MG /0.5 ML PO SOLN: 5.0000 mg | ORAL | 0 refills | Status: AC | PRN

## 2022-08-27 MED ORDER — LORAZEPAM 2 MG/ML PO CONC: 1.0000 mg | ORAL | 0 refills | Status: AC | PRN

## 2022-08-27 MED ORDER — POLYVINYL ALCOHOL 1.4 % OP SOLN: 1.0000 [drp] | Freq: Four times a day (QID) | OPHTHALMIC | 0 refills | Status: AC | PRN

## 2022-08-27 MED ORDER — HALOPERIDOL LACTATE 2 MG/ML PO CONC: 1.0000 mg | ORAL | 0 refills | Status: AC | PRN

## 2022-08-27 NOTE — Progress Notes (Addendum)
[employer] [facility]) Hosptal Liaison Note  Referral received for patient/family interest in [place]. Chart under review by [facility] physician.   Hospice eligibility confirmed.   Bed offered and accepted for transfer to [facility] today. Unit RN please call report to [phone] prior to patient leaving the unit.   Please leave all IV access and ports in place.   Please call with any questions or concerns. Thank you  Roselee Nova, [facility] Liaison [phone]

## 2022-08-27 NOTE — Progress Notes (Signed)
Report called to [place] at [phone].

## 2022-08-27 NOTE — TOC Transition Note (Signed)
Transition of Care [facility]) - CM/SW Discharge Note   Patient Details  Name: GUY TONEY MRN: [id number] Date of Birth: Dec 23, 1940  Transition of Care [facility]) CM/SW Contact:  Coralee Pesa, [place] Phone Number: 08/27/2022, 1:48 PM   Clinical Narrative:     Pt to be transported to hospice home [place] via [place]. Pickup requested for 3 pm.  Nurse to call report to [phone].  Final next level of care: [place] Barriers to Discharge: Barriers Resolved   Patient Goals and CMS Choice Patient states their goals for this hospitalization and ongoing recovery are:: Pt unresponsive and unable to participate in goal setting. CMS Medicare.gov Compare Post Acute Care list provided to:: Patient Represenative (must comment) Choice offered to / list presented to : Adult Children  Discharge Placement              Patient chooses bed at:  ([facility] hospice home) Patient to be transferred to facility by: [place] Name of family member notified: [name] Patient and family notified of of transfer: 08/27/22  Discharge Plan and Services     Post Acute Care Choice: Residential Hospice Bed                               Social Determinants of Health (SDOH) Interventions     Readmission Risk Interventions    08/18/2022    4:24 PM  Readmission Risk Prevention Plan  Transportation Screening Complete  PCP or Specialist Appt within 3-5 Days Complete  HRI or [place] Complete  Social Work Consult for [place] Planning/Counseling Complete  Palliative Care Screening Complete  Medication Review Press photographer) Complete

## 2022-08-27 NOTE — Discharge Summary (Signed)
Physician Discharge Summary  Elizabeth Friedman:[id number] DOB: 03/25/1941 DOA: 08/22/2022  PCP: Adin Hector, MD  Admit date: 08/22/2022 Discharge date: 08/27/2022  Admitted From: Home Disposition:  Hospice house  Discharge Condition:Poor  CODE STATUS:DNR    Brief/Interim Summary: 81 y.o. female with medical history significant of recurrent CVA on Eliquis, dyslipidemia, GERD, osteoarthritis, vitamin B12 deficiency, breast and uterine cancer. Patient had just recently been hospitalized at [facility] from 10/19-10/26 with early subacute infarct involving the posterior right frontal parietal region, left cerebellum, and left temporal occipital region confirmed on MRI.  There was concern for hypercoagulability in the setting malignancy due to recurrent strokes on aspirin and Plavix.  TEE was negative.  After discussions with oncology Plavix was discontinued, aspirin continued, and heparin drip transitioned to Eliquis p.o.  Patient had been sent to inpatient rehab for further treatment.    A code stroke was called on the morning of 10/28.  This was because the patient was found to have a left gaze deviation and aphasia.  Subsequently there was concern for seizure activity.  She was loaded with Keppra.  Patient was hospitalized for further management.  Neurology was consulted.  Patient was placed on long-term monitoring.   Continue comfort measures per previous discussion -discussed hospice house with family today, they are interested in following up with palliative care to further discuss possible disposition given their large family and multiple grandchildren visiting the hospital is somewhat taxing on the family.  Discharge Diagnoses:  Principal Problem:   CVA (cerebral vascular accident) ([place]) Active Problems:   Seizure ([place])   History of ductal carcinoma in situ (DCIS) of breast   History of endometrial cancer   Dyslipidemia   Bronchiectasis without complication ([place])   History of migraine  headaches   GERD without esophagitis   Protein-calorie malnutrition, severe ([facility]CC)  Goals of care See previous notes as well.  Patient transition to comfort care on 08/24/2022. Anticipate in-hospital death.   Acute large left MCA stroke Patient was recently hospitalized at [facility] with subacute stroke. Patient was not a candidate for thrombolytics as patient was on Eliquis. Patient was on aspirin and Eliquis.  Eliquis currently on hold due to concern for hemorrhagic transformation. MRI brain revealed left MCA stroke.  Discussed with neurology who mentioned that this was a fairly large stroke. LDL was 86 on October 20.  HbA1c 5.7 on October 14. Patient underwent TEE on October 23 which did not show any shunts.  LVEF was 65 to 70%.  No thrombus was identified. Neurological prognosis is poor per neurology.  This was communicated to patient's family.  Patient was subsequently transitioned to comfort care.   Status epilepticus Probably related to stroke.  Patient was given Ativan and loaded with Keppra.  Now comfort care.  Antiepileptics have been discontinued.   PSVT Brief run of SVT noted on telemetry.     Hypokalemia   Normal anion gap metabolic acidosis   History of endometrial and breast cancer Patient with history of endometrial mass s/p removal 20 years ago.  Patient then had right breast DCIS status postlumpectomy with radiation treatment in 2021.  Later patient underwent total laparoscopic hysterectomy with bilateral salpingo oophorectomy 12/24/2021 for which pathology report revealed carcinosarcoma.  She had been treated with chemotherapy and radiation, but family notes most recent blood noted persistent elevated CA-125 in the 40s. Looks like patient is followed by medical oncologist Dr. Janese Banks in [place].  Last seen by Dr. Janese Banks on October 11.  Does  not appear that patient is on any active cancer treatment currently.   Mildly elevated transaminases Dyslipidemia   Bronchiectasis She  was supposed to be on azithromycin for chronic bronchiectasis for 3 months/months from Dr. Lanney Gins note 06/2022.     History of migraine headaches -Topamax on hold   GERD -Continue Protonix IV  Discharge Instructions   Allergies as of 08/27/2022   No Known Allergies      Medication List     STOP taking these medications    acetaminophen 500 MG tablet Commonly known as: TYLENOL   alum & mag hydroxide-simeth 161-096-04 MG/5ML suspension Commonly known as: MAALOX/MYLANTA   amLODipine 5 MG tablet Commonly known as: NORVASC   apixaban 5 MG Tabs tablet Commonly known as: ELIQUIS   ascorbic acid 250 MG tablet Commonly known as: VITAMIN C   aspirin 81 MG chewable tablet   atorvastatin 40 MG tablet Commonly known as: LIPITOR   azithromycin 500 MG tablet Commonly known as: ZITHROMAX   CALTRATE 600 PLUS-VIT D PO   Carboxymethylcellulose Sod PF 1 % Gel   Coenzyme Q10 200 MG Tabs   Ferrous Fumarate 324 (106 Fe) MG Tabs tablet Commonly known as: HEMOCYTE - 106 mg FE   gabapentin 400 MG capsule Commonly known as: NEURONTIN   Iron-Vitamin C 65-125 MG Tabs   magnesium hydroxide 400 MG/5ML suspension Commonly known as: MILK OF MAGNESIA   NASACORT ALLERGY 24HR NA   pantoprazole 40 MG tablet Commonly known as: PROTONIX   Restasis 0.05 % ophthalmic emulsion Generic drug: cycloSPORINE   topiramate 50 MG tablet Commonly known as: TOPAMAX   VITAMIN B-12 PO   Vitamin D 125 MCG (5000 UT) Caps   vitamin E 180 MG (400 UNITS) capsule       TAKE these medications    haloperidol 2 MG/ML solution Commonly known as: HALDOL Place 0.5 mLs (1 mg total) under the tongue every 4 (four) hours as needed for agitation (or delirium).   LORazepam 2 MG/ML concentrated solution Commonly known as: ATIVAN Place 0.5 mLs (1 mg total) under the tongue every 4 (four) hours as needed for anxiety.   morphine CONCENTRATE 10 mg / 0.5 ml concentrated solution Place 0.25 mLs (5 mg  total) under the tongue every 3 (three) hours as needed for severe pain.   ondansetron 4 MG disintegrating tablet Commonly known as: ZOFRAN-ODT Take 1 tablet (4 mg total) by mouth every 6 (six) hours as needed for nausea.   polyvinyl alcohol 1.4 % ophthalmic solution Commonly known as: LIQUIFILM TEARS Place 1 drop into both eyes 4 (four) times daily as needed for dry eyes.        No Known Allergies  Procedures/Studies: MR BRAIN WO CONTRAST  Result Date: 08/23/2022 CLINICAL DATA:  Left gaze deviation and aphasia EXAM: MRI HEAD WITHOUT CONTRAST TECHNIQUE: Multiplanar, multiecho pulse sequences of the brain and surrounding structures were obtained without intravenous contrast. COMPARISON:  08/17/2022 FINDINGS: Brain: New area of restricted diffusion with ADC correlate in the left MCA territory, involving the left temporal lobe, posteroinferior frontal lobe, inferolateral parietal lobe, lateral occipital lobe, caudate, and lentiform nucleus (series 5, images 69-87). Increased restricted diffusion with ADC correlate in the right temporal lobe and posteroinferior frontal lobe (series 5, images 70 3-88), with significantly increased cortical involvement. Additional new scattered foci in the right frontal lobe (series 5, image 83 and 84), left occipital lobe (series 5, image 69),and right occipital lobe (series 5, images 60-62). These areas associated with T2 hyperintense signal,  likely cytotoxic edema, and mild gyral swelling. No definite hemosiderin deposition to suggest petechial hemorrhage. No mass, midline shift, hydrocephalus, or extra-axial collection. Vascular: Normal arterial flow voids. Skull and upper cervical spine: Normal marrow signal. Sinuses/Orbits: Clear paranasal sinuses. Status post bilateral lens replacements. Other: The mastoids are well aerated. IMPRESSION: 1. Acute infarction in the left MCA territory, involving the left temporal lobe, posteroinferior frontal lobe, inferolateral  parietal lobe, lateral occipital lobe, caudate, and lentiform nucleus. 2. Further interval extension of previously noted infarct in the right MCA territory. No evidence of hemorrhage or significant mass effect. These results will be called to the ordering clinician or representative by the Radiologist Assistant, and communication documented in the PACS or [employer]. Electronically Signed   By: Merilyn Baba M.D.   On: 08/23/2022 23:52   Overnight EEG with video  Result Date: 08/22/2022 Elizabeth Havens, MD     08/23/2022  8:35 PM Patient Name: Elizabeth Friedman MRN: [id number] Epilepsy Attending: Lora Friedman Referring Physician/Provider: Derek Jack, MD Duration: 08/22/2022 1007 to 08/23/2022 1007  Patient history: 81 y.o. female  with medical hx of previous CVA (10/13), HLD, B12 deficiency, breast and uterine cancer, stage 1 uterine carcinosarcoma. On Eliquis (apixaban) daily. Now complaining of left sided gaze, aphasia, and worsening left sided deficits. EEEG to evaluate for seizure  Level of alertness: awake, asleep  AEDs during EEG study: LEV  Technical aspects: This EEG study was done with scalp electrodes positioned according to the 10-20 International system of electrode placement. Electrical activity was reviewed with band pass filter of 1-'70Hz'$ , sensitivity of 7 uV/mm, display speed of 10m/sec with a '60Hz'$  notched filter applied as appropriate. EEG data were recorded continuously and digitally stored.  Video monitoring was available and reviewed as appropriate.  Description: No clear posterior dominant rhythm was seen. Sleep was characterized by sleep spindles (12-14 Hz), maximal fronto-central region. EEG showed continuous generalized and lateralized right hemisphere 3 to 6 Hz theta-delta slowing admixed with 13-'15Hz'$  frontocentral region. Sharp transients were noted in left frontal region. Hyperventilation and photic stimulation were not performed.    ABNORMALITY - Continuous slow,  generalized and lateralized right hemisphere  IMPRESSION: This study is suggestive of cortical dysfunction in right hemisphere likely secondary to underlying stroke. Additionally there is severe diffuse encephalopathy, nonspecific etiology. No seizures or epileptiform discharges were seen throughout the recording.   PLora Friedman   EEG adult  Result Date: 08/22/2022 YLora Havens MD     08/22/2022 10:51 AM Patient Name: Elizabeth ROLLINSMRN: [id number] Attending: PLora HavensReferring Physician/Provider: SDerek Jack MD Date: 08/22/2022 Duration: 23.41 mins Patient history: 81y.o. female  with medical hx of previous CVA (10/13), HLD, B12 deficiency, breast and uterine cancer, stage 1 uterine carcinosarcoma. On Eliquis (apixaban) daily. Now complaining of left sided gaze, aphasia, and worsening left sided deficits. EEEG to evaluate for seizure Level of alertness:  lethargic AEDs during EEG study: None Technical aspects: This EEG study was done with scalp electrodes positioned according to the 10-20 International system of electrode placement. Electrical activity was reviewed with band pass filter of 1-'70Hz'$ , sensitivity of 7 uV/mm, display speed of 323msec with a '60Hz'$  notched filter applied as appropriate. EEG data were recorded continuously and digitally stored.  Video monitoring was available and reviewed as appropriate. Description: EEG showed continuous generalized 3 to 6 Hz theta-delta slowing admixed with 13-'15Hz'$  frontocentral region. Hyperventilation and photic stimulation were not performed.   ABNORMALITY -  Continuous slow, generalized IMPRESSION: This study is suggestive of severe diffuse encephalopathy, nonspecific etiology. No seizures or epileptiform discharges were seen throughout the recording. Elizabeth Friedman   CT ANGIO HEAD NECK W WO CM W PERF (CODE STROKE)  Result Date: 08/22/2022 CLINICAL DATA:  81 year old female with evidence of acute left MCA infarct on plain  head CT. Recent right MCA infarct. EXAM: CT ANGIOGRAPHY HEAD AND NECK CT PERFUSION BRAIN TECHNIQUE: Multidetector CT imaging of the head and neck was performed using the standard protocol during bolus administration of intravenous contrast. Multiplanar CT image reconstructions and MIPs were obtained to evaluate the vascular anatomy. Carotid stenosis measurements (when applicable) are obtained utilizing NASCET criteria, using the distal internal carotid diameter as the denominator. Multiphase CT imaging of the brain was performed following IV bolus contrast injection. Subsequent parametric perfusion maps were calculated using RAPID software. RADIATION DOSE REDUCTION: This exam was performed according to the departmental dose-optimization program which includes automated exposure control, adjustment of the mA and/or kV according to patient size and/or use of iterative reconstruction technique. COMPARISON:  Plain head CT today 0727 hours. Recent CTA head and neck 08/08/2022. FINDINGS: CT Brain Perfusion Findings: ASPECTS: 9 CBF (<30%) Volume: 40 mLmL, posterior left MCA territory Perfusion (Tmax>6.0s) volume: 38m, although this includes some oligemia in the contralateral right hemisphere. Hypoperfusion index 0.3, but there appears to be substantial T-max greater than 10 S in the posterior left MCA territory. Mismatch Volume: Less than 541m and perhaps a very little mismatch given the above. Infarction Location:Acute is in the left MCA CTA NECK Skeleton: Stable.  Cervical facet degeneration. Upper chest: Stable apical lung scarring. Stable visible superior mediastinum. Other neck: Mild retained secretions in the pharynx, otherwise stable. Aortic arch: Stable Calcified aortic atherosclerosis. Mildly bovine arch configuration. Right carotid system: Stable. No significant stenosis despite some plaque. Left carotid system: Stable.  Mild for age plaque and no stenosis. Vertebral arteries: Stable. Proximal left subclavian  artery plaque without significant stenosis. Dominant left vertebral artery. Late entry of the right vertebral artery into the cervical transverse foramen. No significant stenosis to the skull base. CTA HEAD Posterior circulation: Stable. No posterior circulation large vessel occlusion or significant stenosis. Anterior circulation: Both ICA siphons are stable and patent. Mild plaque and no significant stenosis. Bilateral ACAs and right MCAs are stable from the recent CTA. Left MCA M1 segment and bifurcation remain patent. There is an acute left MCA branch occlusion, which is the dominant posterior M2 branch although the occlusion occurs just distal to a branch takeoff as seen on series 13, image 25, and is technically at the proximal M3 level. Other left MCA branches appear stable. Venous sinuses: Early contrast timing, grossly patent. Anatomic variants: Unchanged from description on prior CTA. Review of the MIP images confirms the above findings Preliminary result of left MCA branch occlusion were communicated to Dr. COSu Monksn 08/22/2022 at 0746 hours and then discussed by telephone at 0747 hours. IMPRESSION: 1. Positive for emergent Left MCA branch occlusion, very proximal Left M3 segment of the dominant posterior left M2 division. 2. However, CTP estimates a larger infarct core (40 mL) relative to the ASPECTS of 9. And conspicuous Tmax>10s in the posterior Left MCA territory suggests whatever core is present might be rapidly expanding. 3. The above was discussed by telephone with Dr. COSu Monksn 08/22/2022 at 0756 hours. 4. Otherwise stable CTA head and neck since 08/08/2022. Electronically Signed   By: H Genevie Ann.D.   On: 08/22/2022  08:02   CT HEAD CODE STROKE WO CONTRAST  Result Date: 08/22/2022 CLINICAL DATA:  Code stroke. 81 year old female code stroke presentation with leftward gaze which is new. Recent right MCA infarct. EXAM: CT HEAD WITHOUT CONTRAST TECHNIQUE: Contiguous axial images were  obtained from the base of the skull through the vertex without intravenous contrast. RADIATION DOSE REDUCTION: This exam was performed according to the departmental dose-optimization program which includes automated exposure control, adjustment of the mA and/or kV according to patient size and/or use of iterative reconstruction technique. COMPARISON:  None Available. FINDINGS: Brain: Of all vein right MCA territory infarct primarily along the sylvian fissure with mild petechial hemorrhage on series 2, image 19, perhaps slightly increased in conspicuity from yesterday but no malignant hemorrhagic transformation or mass effect. Chronic lacunar infarct left caudate nucleus. Subtle loss of gray-white matter differentiation in the posterior left MCA territory most apparent on images 14 and 15 affecting the posterior temporal lobe, left M3 segment. Other gray-white matter differentiation stable, including small cerebellar infarcts. No midline shift or ventriculomegaly. Normal basilar cisterns. Vascular: Stable. Skull: No acute osseous abnormality identified. Sinuses/Orbits: Visualized paranasal sinuses and mastoids are stable and well aerated. Other: Stable orbit and scalp soft tissues. ASPECTS [facility] Stroke Program Early CT Score) Total score (0-10 with 10 being normal): 9, abnormal left M3 segment. Study discussed by telephone with Dr. Quinn Axe on 08/22/2022 at 07:35 . IMPRESSION: 1. Acute posterior Left MCA territory infarct affecting the M3 segment. ASPECTS 9. 2. No malignant hemorrhagic transformation or intracranial mass effect. But evidence of petechial hemorrhage in the recent Right MCA infarct. 3. Study discussed by telephone with Dr. Concepcion Living on 08/22/2022 at 07:35 . Electronically Signed   By: Genevie Ann M.D.   On: 08/22/2022 07:39   CT HEAD WO CONTRAST (5MM)  Result Date: 08/21/2022 CLINICAL DATA:  Altered mental status EXAM: CT HEAD WITHOUT CONTRAST TECHNIQUE: Contiguous axial images were obtained from the  base of the skull through the vertex without intravenous contrast. RADIATION DOSE REDUCTION: This exam was performed according to the departmental dose-optimization program which includes automated exposure control, adjustment of the mA and/or kV according to patient size and/or use of iterative reconstruction technique. COMPARISON:  08/16/2022 FINDINGS: Brain: Low-attenuation of the frontoparietal lobe concerning for an acute MCA territory infarct. No evidence of hemorrhage, hydrocephalus, extra-axial collection or mass lesion/mass effect. Vascular: No hyperdense vessel or unexpected calcification. Skull: No osseous abnormality. Sinuses/Orbits: Visualized paranasal sinuses are clear. Visualized mastoid sinuses are clear. Visualized orbits demonstrate no focal abnormality. Other: None IMPRESSION: 1. Low-attenuation of the frontoparietal lobe concerning for an acute non-hemorrhagic MCA territory infarct. Electronically Signed   By: Kathreen Devoid M.D.   On: 08/21/2022 15:25   CT Angio Chest Pulmonary Embolism (PE) W or WO Contrast  Result Date: 08/17/2022 CLINICAL DATA:  Pulmonary embolism (PE) suspected, high prob EXAM: CT ANGIOGRAPHY CHEST WITH CONTRAST TECHNIQUE: Multidetector CT imaging of the chest was performed using the standard protocol during bolus administration of intravenous contrast. Multiplanar CT image reconstructions and MIPs were obtained to evaluate the vascular anatomy. RADIATION DOSE REDUCTION: This exam was performed according to the departmental dose-optimization program which includes automated exposure control, adjustment of the mA and/or kV according to patient size and/or use of iterative reconstruction technique. CONTRAST:  39m OMNIPAQUE IOHEXOL 350 MG/ML SOLN COMPARISON:  Chest XR, 08/08/2022. CT chest, 08/07/2022. NM V/Q scan, 08/08/2022 FINDINGS: Cardiovascular: Satisfactory opacification of the pulmonary arteries to the segmental level. No evidence of pulmonary embolism. Normal  heart size. No pericardial effusion. Mediastinum/Nodes: No enlarged mediastinal, hilar, or axillary lymph nodes. Thyroid gland, trachea, and esophagus demonstrate no significant findings. Lungs/Pleura: No focal consolidation or mass. Similar distribution and appearance of tree-in-bud nodularities, greatest within the RIGHT lung base. LEFT basilar calcified granuloma. No pleural effusion or pneumothorax. Upper Abdomen: No acute abnormality. Punctate calcified granulomas within liver and spleen, likely sequelae of prior granulomatous disease. Musculoskeletal: No chest wall abnormality. No acute or significant osseous findings. Review of the MIP images confirms the above findings. IMPRESSION: 1.  No segmental or larger pulmonary embolus. 2. Similar distribution and appearance of tree-in-bud nodularities, greatest within the RIGHT lung base. Findings are likely to reflect an infectious or inflammatory process. 3. Punctate calcified granulomas within chest and abdomen, likely sequelae of prior granulomatous disease. Electronically Signed   By: Michaelle Birks M.D.   On: 08/17/2022 16:42   MR CERVICAL SPINE W WO CONTRAST  Result Date: 08/17/2022 CLINICAL DATA:  Ataxia.  Left-sided weakness.  Stroke. EXAM: MRI CERVICAL SPINE WITHOUT AND WITH CONTRAST TECHNIQUE: Multiplanar and multiecho pulse sequences of the cervical spine, to include the craniocervical junction and cervicothoracic junction, were obtained without and with intravenous contrast. CONTRAST:  64m GADAVIST GADOBUTROL 1 MMOL/ML IV SOLN COMPARISON:  None Available. FINDINGS: Alignment: Normal Vertebrae: No fracture or focal lesion. Cord: No cord insult.  No cord compression. Posterior Fossa, vertebral arteries, paraspinal tissues: See results of brain MRI. Disc levels: The foramen magnum is widely patent. There is ordinary mild osteoarthritis of the C1-2 articulation but no encroachment upon the neural structures. C2-3: Facet osteoarthritis left more than  right. No compressive stenosis. C3-4: Bulging of the disc. Facet osteoarthritis left more than right. Moderate left foraminal narrowing. C4-5: Spondylosis with endplate osteophytes and bulging of the disc. No cord compression. Moderate bilateral foraminal narrowing. C5-6: Spondylosis with endplate osteophytes and bulging of the disc. No cord compression. Moderate bilateral foraminal narrowing. C6-7: Small right paracentral disc protrusion. No neural compression. C7-T1: Mild facet osteoarthritis.  No stenosis. IMPRESSION: 1. No cord insult. 2. Degenerative spondylosis from C3-4 through C5-6. No cord compression. Foraminal narrowing that could possibly be symptomatic on the left at C3-4 and bilateral at C4-5 and C5-6. Electronically Signed   By: MNelson ChimesM.D.   On: 08/17/2022 14:13   MR BRAIN W WO CONTRAST  Result Date: 08/17/2022 CLINICAL DATA:  Follow-up stroke.  Right-sided weakness. EXAM: MRI HEAD WITHOUT AND WITH CONTRAST TECHNIQUE: Multiplanar, multiecho pulse sequences of the brain and surrounding structures were obtained without and with intravenous contrast. CONTRAST:  541mGADAVIST GADOBUTROL 1 MMOL/ML IV SOLN COMPARISON:  Head CT yesterday.  MRI 08/13/2022. FINDINGS: Brain: Small areas of acute infarction previously seen in the left cerebellum are normalizing on diffusion imaging. No new cerebellar or brainstem insult. There has been extension of the region of infarction in the right hemisphere, now seen affecting the deep insula and more of the frontoparietal cortical and subcortical brain. This is consistent with progressive right MCA branch vessel infarction. Newly seen subcentimeter focus of acute infarction in the left frontal white matter. Normalization of diffusion at the location of punctate acute infarctions previously seen in the left posterior temporal lobe. No evidence of hemorrhage or mass effect. Elsewhere, chronic ischemic changes of the pons, thalami, basal ganglia and hemispheric  white matter are stable. No hydrocephalus. No extra-axial collection. After contrast administration, some foci of enhancement are notable in the regions of infarction in the left cerebellum and right frontoparietal region. Vascular: Major vessels  at the base of the brain show flow. Skull and upper cervical spine: Negative Sinuses/Orbits: Clear/normal Other: None IMPRESSION: 1. Interval extension of infarction in the right hemisphere, now seen affecting the deep insula and more of the frontoparietal cortical and subcortical brain. This is consistent with progressive right MCA branch vessel infarction. No evidence of hemorrhage or mass effect. 2. Newly seen subcentimeter focus of acute infarction in the left frontal white matter. 3. Some normalization of diffusion at the location of acute infarctions previously seen in the left posterior temporal lobe and left cerebellum. 4. Chronic small-vessel ischemic changes elsewhere throughout the brain as outlined above. Electronically Signed   By: Nelson Chimes M.D.   On: 08/17/2022 14:10   ECHO TEE  Result Date: 08/17/2022    TRANSESOPHOGEAL ECHO REPORT   Patient Name:   Elizabeth Friedman Date of Exam: 08/17/2022 Medical Rec #:  [id number]    Height:       62.0 in Accession #:    [id number]   Weight:       102.0 lb Date of Birth:  [birth year]   BSA:          1.436 m Patient Age:    72 years     BP:           140/75 mmHg Patient Gender: F            HR:           72 bpm. Exam Location:  [facility] Procedure: Transesophageal Echo, Cardiac Doppler, Color Doppler and Saline            Contrast Bubble Study Indications:     Acute CVA  History:         Patient has prior history of Echocardiogram examinations, most                  recent 08/10/2022. Stroke. Atypical chest pain , Breast cancer.  Sonographer:     Sherrie Sport Referring Phys:  [id number] [facility] [place] Diagnosing Phys: Neoma Laming PROCEDURE: The transesophogeal probe was passed without difficulty through the esophogus of the  patient. Sedation performed by performing physician. The patient developed no complications during the procedure. IMPRESSIONS  1. Left ventricular ejection fraction, by estimation, is 65 to 70%. The left ventricle has normal function.  2. Right ventricular systolic function is normal. The right ventricular size is normal.  3. Left atrial size was mildly dilated. No left atrial/left atrial appendage thrombus was detected.  4. Right atrial size was mildly dilated.  5. The mitral valve is grossly normal. Trivial mitral valve regurgitation.  6. The aortic valve is calcified. Aortic valve regurgitation is trivial.  7. Agitated saline contrast bubble study was negative, with no evidence of any interatrial shunt. Conclusion(s)/Recommendation(s): No LA/LAA thrombus identified. Negative bubble study for interatrial shunt. No intracardiac source of embolism detected on this on this transesophageal echocardiogram. FINDINGS  Left Ventricle: Left ventricular ejection fraction, by estimation, is 65 to 70%. The left ventricle has normal function. The left ventricular internal cavity size was normal in size. Right Ventricle: The right ventricular size is normal. No increase in right ventricular wall thickness. Right ventricular systolic function is normal. Left Atrium: Left atrial size was mildly dilated. No left atrial/left atrial appendage thrombus was detected. Right Atrium: Right atrial size was mildly dilated. Pericardium: There is no evidence of pericardial effusion. Mitral Valve: The mitral valve is grossly normal. Trivial mitral valve regurgitation. Tricuspid Valve: The tricuspid valve is grossly  normal. Tricuspid valve regurgitation is mild. Aortic Valve: The aortic valve is calcified. Aortic valve regurgitation is trivial. Pulmonic Valve: The pulmonic valve was normal in structure. Pulmonic valve regurgitation is trivial. Aorta: The aortic root, ascending aorta and aortic arch are all structurally normal, with no evidence  of dilitation or obstruction. IAS/Shunts: No atrial level shunt detected by color flow Doppler. Agitated saline contrast was given intravenously to evaluate for intracardiac shunting. Agitated saline contrast bubble study was negative, with no evidence of any interatrial shunt. Neoma Laming Electronically signed by Neoma Laming Signature Date/Time: 08/17/2022/9:33:10 AM    Final    CT HEAD WO CONTRAST (5MM)  Result Date: 08/16/2022 CLINICAL DATA:  Neuro deficit, acute, stroke suspected. Severe headache earlier, now better. EXAM: CT HEAD WITHOUT CONTRAST TECHNIQUE: Contiguous axial images were obtained from the base of the skull through the vertex without intravenous contrast. RADIATION DOSE REDUCTION: This exam was performed according to the departmental dose-optimization program which includes automated exposure control, adjustment of the mA and/or kV according to patient size and/or use of iterative reconstruction technique. COMPARISON:  Head CT dated 08/13/2022.  Brain MRI dated 08/13/2022. FINDINGS: Brain: Mild generalized age related parenchymal atrophy with commensurate dilatation of the ventricles and sulci. Ventricles are stable in size and configuration in the short-term interval. Ill-defined area of low-density within the posterior RIGHT frontal lobe, corresponding to the brain MRI report of 08/13/2022 which described acute early subacute infarct in this area. Similar findings in the LEFT cerebellum and LEFT temporal occipital lobe. No mass effect or midline shift. No parenchymal hemorrhage. No extra-axial hemorrhage. Old lacunar infarct again noted in the LEFT caudate. Mild chronic small vessel ischemic changes again noted within the bilateral periventricular white matter regions. Vascular: Chronic calcified atherosclerotic changes of the large vessels at the skull base. No unexpected hyperdense vessel. Skull: Normal. Negative for fracture or focal lesion. Sinuses/Orbits: No acute finding. Other:  None. IMPRESSION: 1. Ill-defined areas of low-density within the posterior RIGHT frontal lobe, LEFT cerebellum and LEFT temporal-occipital lobe, corresponding to the findings of acute to early subacute infarcts reported on brain MRI of 08/13/2022. 2. No new findings compared to the recent head CT and brain MRI of 08/13/2022. No mass effect or midline shift on today's exam. No parenchymal or extra-axial hemorrhage. 3. Chronic ischemic changes in the white matter and LEFT caudate. Electronically Signed   By: Franki Cabot M.D.   On: 08/16/2022 09:20   MR BRAIN WO CONTRAST  Result Date: 08/14/2022 CLINICAL DATA:  Follow-up examination for stroke. EXAM: MRI HEAD WITHOUT CONTRAST TECHNIQUE: Multiplanar, multiecho pulse sequences of the brain and surrounding structures were obtained without intravenous contrast. COMPARISON:  Prior CT from earlier the same day. FINDINGS: Brain: Cerebral volume within normal limits. Scattered patchy T2/FLAIR hyperintensity involving the periventricular deep white matter of both cerebral hemispheres as well as the pons, consistent with chronic microvascular ischemic disease, mild for age. Few small remote lacunar infarcts noted at the left caudate head and ventral right thalamus. Patchy diffusion signal abnormality involving the cortical and subcortical aspect of the right frontoparietal region, with involvement of the pre and postcentral gyri, consistent with an acute to early subacute right MCA distribution infarct. No associated hemorrhage. There is an additional patchy small volume acute to early subacute left cerebellar infarct. Minimal petechial blood products at this level without hemorrhagic transformation or significant mass effect. Few additional punctate foci of acute to early subacute ischemia noted within the left temporal occipital region (series 5, images 19,  16). Gray-white matter differentiation otherwise maintained. No other acute or chronic intracranial blood  products. No mass lesion or midline shift. No hydrocephalus or extra-axial fluid collection. Partially empty sella noted. Vascular: Major intracranial vascular flow voids are maintained. Skull and upper cervical spine: Craniocervical junction normal. Bone marrow signal intensity within normal limits. No scalp soft tissue abnormality. Sinuses/Orbits: Prior bilateral ocular lens replacement. Paranasal sinuses mastoid air cells are clear. Other: None. IMPRESSION: 1. Scattered acute to early subacute infarcts involving the posterior right frontal parietal region, left cerebellum, and left temporoccipital region. Minimal petechial blood products at the left cerebellum without hemorrhagic transformation or significant regional mass effect. Given the various vascular distributions involved, a central thromboembolic etiology is likely. 2. Underlying chronic microvascular ischemic disease with a few small remote lacunar infarcts as above. Electronically Signed   By: Jeannine Boga M.D.   On: 08/14/2022 00:21   CT HEAD WO CONTRAST (5MM)  Result Date: 08/13/2022 CLINICAL DATA:  Dizziness.  Recent stroke. EXAM: CT HEAD WITHOUT CONTRAST TECHNIQUE: Contiguous axial images were obtained from the base of the skull through the vertex without intravenous contrast. RADIATION DOSE REDUCTION: This exam was performed according to the departmental dose-optimization program which includes automated exposure control, adjustment of the mA and/or kV according to patient size and/or use of iterative reconstruction technique. COMPARISON:  08/08/2022 FINDINGS: Brain: No evidence of intracranial hemorrhage, acute infarction, hydrocephalus, extra-axial collection, or mass lesion/mass effect. Old lacunar infarcts are again seen involving the left basal ganglia and left cerebellum. Vascular:  No hyperdense vessel or other acute findings. Skull: No evidence of fracture or other significant bone abnormality. Sinuses/Orbits:  No acute  findings. Other: None. IMPRESSION: No acute intracranial abnormality. Old lacunar infarcts in left basal ganglia and left cerebellum. Electronically Signed   By: Marlaine Hind M.D.   On: 08/13/2022 17:51   ECHOCARDIOGRAM COMPLETE BUBBLE STUDY  Result Date: 08/10/2022    ECHOCARDIOGRAM REPORT   Patient Name:   KAYSIA WILLARD Date of Exam: 08/10/2022 Medical Rec #:  [id number]    Height:       62.0 in Accession #:    [id number]   Weight:       101.2 lb Date of Birth:  1941/10/20   BSA:          1.431 m Patient Age:    72 years     BP:           134/77 mmHg Patient Gender: F            HR:           65 bpm. Exam Location:  [facility] Procedure: 2D Echo, Color Doppler, Cardiac Doppler and Saline Contrast Bubble            Study Indications:     Stroke 434.91 / I63.9  History:         Patient has prior history of Echocardiogram examinations, most                  recent 12/22/2021. Risk Factors:Dyslipidemia. Migraines.  Sonographer:     Sherrie Sport Referring Phys:  [place] Diagnosing Phys: Serafina Royals MD IMPRESSIONS  1. Left ventricular ejection fraction, by estimation, is 60 to 65%. The left ventricle has normal function. The left ventricle has no regional wall motion abnormalities. Left ventricular diastolic parameters were normal.  2. Right ventricular systolic function is normal. The right ventricular size is normal.  3. The mitral valve is normal in  structure. Mild mitral valve regurgitation.  4. The aortic valve is normal in structure. Aortic valve regurgitation is not visualized. FINDINGS  Left Ventricle: Left ventricular ejection fraction, by estimation, is 60 to 65%. The left ventricle has normal function. The left ventricle has no regional wall motion abnormalities. The left ventricular internal cavity size was normal in size. There is  no left ventricular hypertrophy. Left ventricular diastolic parameters were normal. Right Ventricle: The right ventricular size is normal. No increase in right  ventricular wall thickness. Right ventricular systolic function is normal. Left Atrium: Left atrial size was normal in size. Right Atrium: Right atrial size was normal in size. Pericardium: There is no evidence of pericardial effusion. Mitral Valve: The mitral valve is normal in structure. Mild mitral valve regurgitation. Tricuspid Valve: The tricuspid valve is normal in structure. Tricuspid valve regurgitation is mild. Aortic Valve: The aortic valve is normal in structure. Aortic valve regurgitation is not visualized. Aortic valve mean gradient measures 4.0 mmHg. Aortic valve peak gradient measures 7.8 mmHg. Aortic valve area, by VTI measures 1.98 cm. Pulmonic Valve: The pulmonic valve was normal in structure. Pulmonic valve regurgitation is trivial. Aorta: The aortic root and ascending aorta are structurally normal, with no evidence of dilitation. IAS/Shunts: No atrial level shunt detected by color flow Doppler. Agitated saline contrast was given intravenously to evaluate for intracardiac shunting.  LEFT VENTRICLE PLAX 2D LVIDd:         3.20 cm   Diastology LVIDs:         1.90 cm   LV e' medial:    5.66 cm/s LV PW:         0.60 cm   LV E/e' medial:  15.4 LV IVS:        1.00 cm   LV e' lateral:   8.92 cm/s LVOT diam:     1.90 cm   LV E/e' lateral: 9.8 LV SV:         58 LV SV Index:   40 LVOT Area:     2.84 cm  RIGHT VENTRICLE RV Basal diam:  3.80 cm RV Mid diam:    3.50 cm RV S prime:     19.50 cm/s TAPSE (M-mode): 2.8 cm LEFT ATRIUM           Index        RIGHT ATRIUM           Index LA diam:      2.40 cm 1.68 cm/m   RA Area:     10.20 cm LA Vol (A2C): 33.6 ml 23.48 ml/m  RA Volume:   20.70 ml  14.46 ml/m LA Vol (A4C): 11.4 ml 7.97 ml/m  AORTIC VALVE AV Area (Vmax):    2.01 cm AV Area (Vmean):   2.02 cm AV Area (VTI):     1.98 cm AV Vmax:           139.50 cm/s AV Vmean:          91.400 cm/s AV VTI:            0.291 m AV Peak Grad:      7.8 mmHg AV Mean Grad:      4.0 mmHg LVOT Vmax:         99.00 cm/s  LVOT Vmean:        65.000 cm/s LVOT VTI:          0.203 m LVOT/AV VTI ratio: 0.70  AORTA Ao Root diam: 3.20 cm MITRAL VALVE  TRICUSPID VALVE MV Area (PHT): 3.08 cm    TR Peak grad:   17.1 mmHg MV Decel Time: 246 msec    TR Vmax:        207.00 cm/s MV E velocity: 87.00 cm/s MV A velocity: 89.60 cm/s  SHUNTS MV E/A ratio:  0.97        Systemic VTI:  0.20 m                            Systemic Diam: 1.90 cm Serafina Royals MD Electronically signed by Serafina Royals MD Signature Date/Time: 08/10/2022/9:02:45 AM    Final    CT ANGIO HEAD NECK W WO CM  Result Date: 08/08/2022 CLINICAL DATA:  Acute neurologic deficit EXAM: CT ANGIOGRAPHY HEAD AND NECK TECHNIQUE: Multidetector CT imaging of the head and neck was performed using the standard protocol during bolus administration of intravenous contrast. Multiplanar CT image reconstructions and MIPs were obtained to evaluate the vascular anatomy. Carotid stenosis measurements (when applicable) are obtained utilizing NASCET criteria, using the distal internal carotid diameter as the denominator. RADIATION DOSE REDUCTION: This exam was performed according to the departmental dose-optimization program which includes automated exposure control, adjustment of the mA and/or kV according to patient size and/or use of iterative reconstruction technique. CONTRAST:  71m OMNIPAQUE IOHEXOL 350 MG/ML SOLN COMPARISON:  08/07/2022 FINDINGS: CT HEAD FINDINGS Brain: There is no mass, hemorrhage or extra-axial collection. The size and configuration of the ventricles and extra-axial CSF spaces are normal. 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 There is hypoattenuation of the periventricular white matter, most commonly indicating chronic ischemic microangiopathy. Skull: The visualized skull base, calvarium and extracranial soft tissues are normal. Sinuses/Orbits: No fluid levels or advanced mucosal thickening of the visualized paranasal sinuses. No mastoid or middle  ear effusion. The orbits are normal. CTA NECK FINDINGS SKELETON: There is no bony spinal canal stenosis. No lytic or blastic lesion. OTHER NECK: Normal pharynx, larynx and major salivary glands. No cervical lymphadenopathy. Unremarkable thyroid gland. UPPER CHEST: Biapical scarring AORTIC ARCH: There is calcific atherosclerosis of the aortic arch. There is no aneurysm, dissection or hemodynamically significant stenosis of the visualized portion of the aorta. Conventional 3 vessel aortic branching pattern. The visualized proximal subclavian arteries are widely patent. RIGHT CAROTID SYSTEM: No dissection, occlusion or aneurysm. Mild atherosclerotic calcification at the carotid bifurcation without hemodynamically significant stenosis. LEFT CAROTID SYSTEM: No dissection, occlusion or aneurysm. Mild atherosclerotic calcification at the carotid bifurcation without hemodynamically significant stenosis. VERTEBRAL ARTERIES: Left dominant configuration. Both origins are clearly patent. There is no dissection, occlusion or flow-limiting stenosis to the skull base (V1-V3 segments). CTA HEAD FINDINGS POSTERIOR CIRCULATION: --Vertebral arteries: Normal V4 segments. --Inferior cerebellar arteries: Normal. --Basilar artery: Moderate stenosis of the distal basilar artery. --Superior cerebellar arteries: Normal. --Posterior cerebral arteries (PCA): Short segment severe stenosis/occlusion of the left P1 segment with reconstitution. Severe stenosis or short segment occlusion of the proximal left P3 segment. Right PCA is normal. There is a fetal predominant origin of the right PCA and a diminutive left P-comm. ANTERIOR CIRCULATION: --Intracranial internal carotid arteries: Normal. --Anterior cerebral arteries (ACA): Normal. Both A1 segments are present. Patent anterior communicating artery (a-comm). --Middle cerebral arteries (MCA): Normal. VENOUS SINUSES: As permitted by contrast timing, patent. ANATOMIC VARIANTS: Fetal origin of the  right posterior cerebral artery. Review of the MIP images confirms the above findings. IMPRESSION: 1. Short segment severe stenosis/occlusion of the left P1 segment with reconstitution.  2. Severe stenosis or short segment occlusion of the proximal left P3 segment. 3. Moderate stenosis of the distal basilar artery. 4. 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 5. Mild bilateral carotid bifurcation atherosclerosis without hemodynamically significant stenosis. Electronically Signed   By: Ulyses Jarred M.D.   On: 08/08/2022 19:02   US Venous Img Lower Bilateral (DVT)  Result Date: 08/08/2022 CLINICAL DATA:  Embolic stroke. Hypercoagulable. Clinical concern for DVT. EXAM: BILATERAL LOWER EXTREMITY VENOUS DOPPLER ULTRASOUND TECHNIQUE: Gray-scale sonography with compression, as well as color and duplex ultrasound, were performed to evaluate the deep venous system(s) from the level of the common femoral vein through the popliteal and proximal calf veins. COMPARISON:  None Available. FINDINGS: VENOUS Normal compressibility of the common femoral, superficial femoral, and popliteal veins, as well as the visualized calf veins. Visualized portions of profunda femoral vein and great saphenous vein unremarkable. No filling defects to suggest DVT on grayscale or color Doppler imaging. Doppler waveforms show normal direction of venous flow, normal respiratory plasticity and response to augmentation. Limited views of the contralateral common femoral vein are unremarkable. OTHER None. Limitations: Left peroneal vein not visualized. IMPRESSION: No evidence of a right or left lower extremity deep venous thrombosis. Electronically Signed   By: Lajean Manes M.D.   On: 08/08/2022 18:17   NM Pulmonary Perfusion  Result Date: 08/08/2022 CLINICAL DATA:  Pulmonary embolism suspected. EXAM: NUCLEAR MEDICINE PERFUSION LUNG SCAN TECHNIQUE: Perfusion images were obtained in multiple projections after intravenous injection of  radiopharmaceutical. Ventilation scans intentionally deferred if perfusion scan and chest x-ray adequate for interpretation during COVID 19 epidemic. RADIOPHARMACEUTICALS:  4.1 mCi Tc-48mMAA IV COMPARISON:  Chest x-ray earlier same day FINDINGS: Heterogeneous lung perfusion identified in the upper lobes bilaterally and in both lung bases. No definite peripheral wedge-shaped filling defect in either lung. IMPRESSION: Lung perfusion scan indeterminate for acute pulmonary embolus. Heterogeneous perfusion may reflect underlying emphysema. Consider chest CTA to further evaluate if patient renal function permits. Electronically Signed   By: EMisty StanleyM.D.   On: 08/08/2022 10:40   DG Chest Port 1 View  Result Date: 08/08/2022 CLINICAL DATA:  Dyspnea EXAM: PORTABLE CHEST 1 VIEW COMPARISON:  CT 08/07/2022 FINDINGS: The heart size and mediastinal contours are within normal limits. Reticulonodular opacities within the periphery of the right lower lobe, similar to the recent previous CT. No new airspace consolidation. No pleural effusion or pneumothorax. Calcified granuloma at the left lung base. The visualized skeletal structures are unremarkable. IMPRESSION: Unchanged reticulonodular opacities within the periphery of the right lower lobe. No new airspace consolidation. Electronically Signed   By: NDavina PokeD.O.   On: 08/08/2022 09:13   MR Brain W and Wo Contrast  Result Date: 08/07/2022 CLINICAL DATA:  Dizziness EXAM: MRI HEAD WITHOUT AND WITH CONTRAST TECHNIQUE: Multiplanar, multiecho pulse sequences of the brain and surrounding structures were obtained without and with intravenous contrast. CONTRAST:  464mGADAVIST GADOBUTROL 1 MMOL/ML IV SOLN COMPARISON:  08/25/2019 FINDINGS: Brain: Scattered punctate foci of acute ischemia within both cerebellar hemispheres, left basal ganglia and both frontal lobes. Pattern is most consistent with emboli. No acute or chronic hemorrhage. There is multifocal  hyperintense T2-weighted signal within the white matter. Parenchymal volume and CSF spaces are normal. The midline structures are normal. There is no abnormal contrast enhancement. Vascular: Major flow voids are preserved. Skull and upper cervical spine: Normal calvarium and skull base. Visualized upper cervical spine and soft tissues are normal. Sinuses/Orbits:No paranasal sinus fluid  levels or advanced mucosal thickening. No mastoid or middle ear effusion. Normal orbits. IMPRESSION: Scattered punctate foci of acute ischemia within both cerebellar hemispheres, left basal ganglia and both frontal lobes. Pattern is most consistent with emboli, most likely from a cardiac or proximal aortic source. No hemorrhage or mass effect. Electronically Signed   By: Ulyses Jarred M.D.   On: 08/07/2022 21:22   CT HEAD WO CONTRAST  Result Date: 08/07/2022 CLINICAL DATA:  Dizziness on Monday followed by vision changes on Wednesday EXAM: CT HEAD WITHOUT CONTRAST TECHNIQUE: Contiguous axial images were obtained from the base of the skull through the vertex without intravenous contrast. RADIATION DOSE REDUCTION: This exam was performed according to the departmental dose-optimization program which includes automated exposure control, adjustment of the mA and/or kV according to patient size and/or use of iterative reconstruction technique. COMPARISON:  Brain MRI 08/25/2019 FINDINGS: Brain: There is no acute intracranial hemorrhage, extra-axial fluid collection, or acute territorial infarct. Parenchymal volume is normal for age. The ventricles are normal in size. Gray-white differentiation is preserved. There is a small remote lacunar infarct in the left caudate head, new since the prior MRI from 2020. There is no mass lesion.  There is no mass effect or midline shift. Vascular: No hyperdense vessel or unexpected calcification. Skull: Normal. Negative for fracture or focal lesion. Sinuses/Orbits: The imaged paranasal sinuses are  clear. Bilateral lens implants are in place. The globes and orbits are otherwise unremarkable. Other: None. IMPRESSION: 1. No acute intracranial pathology. 2. Small infarct in the left caudate head is new since the brain MRI from 2020 but is remote in appearance. Otherwise, unremarkable for age head CT. Electronically Signed   By: Valetta Mole M.D.   On: 08/07/2022 16:52   CT CHEST ABDOMEN PELVIS W CONTRAST  Result Date: 08/07/2022 CLINICAL DATA:  History of endometrial cancer, rising CA 125. * Tracking Code: BO * EXAM: CT CHEST, ABDOMEN, AND PELVIS WITH CONTRAST TECHNIQUE: Multidetector CT imaging of the chest, abdomen and pelvis was performed following the standard protocol during bolus administration of intravenous contrast. RADIATION DOSE REDUCTION: This exam was performed according to the departmental dose-optimization program which includes automated exposure control, adjustment of the mA and/or kV according to patient size and/or use of iterative reconstruction technique. CONTRAST:  20m OMNIPAQUE IOHEXOL 300 MG/ML  SOLN COMPARISON:  CT December 11, 2021. FINDINGS: CT CHEST FINDINGS Cardiovascular: Aortic atherosclerosis. Normal caliber thoracic aorta. No central pulmonary embolus on this nondedicated study. Coronary artery calcifications. Normal size heart. No significant pericardial effusion/thickening. Mediastinum/Nodes: No supraclavicular adenopathy. No discrete thyroid nodule. No pathologically enlarged mediastinal, hilar or axillary lymph nodes. Unchanged calcified nodal tissue in the subcarinal space. The esophagus is grossly unremarkable. Lungs/Pleura: Biapical pleuroparenchymal scarring. Bronchiectasis with peripheral airway impaction noted in the right lower lobe on image 93/5 with adjacent tree-in-bud nodules. Additional tree-in-bud nodularity in the right middle lobe, lingula and scattered throughout the right lower lobe. Similar ground-glass opacities in the left lower lobe for instance on  image 111/5 measuring 7 mm and 95/5 measuring 5 mm, unchanged. No new suspicious pulmonary nodules or masses. No pleural effusion. No pneumothorax. Musculoskeletal: Biopsy clip in the right breast. No aggressive lytic or blastic lesion of bone. Multilevel degenerative changes spine. Diffuse demineralization of bone. CT ABDOMEN PELVIS FINDINGS Hepatobiliary: No suspicious hepatic lesion. Gallbladder is unremarkable. Common bile duct measures 5 mm, unchanged Pancreas: No pancreatic ductal dilation or evidence of acute inflammation. Spleen: Calcified hepatic granulomata. Adrenals/Urinary Tract: Bilateral adrenal glands appear normal. No  hydronephrosis. Kidneys demonstrate symmetric enhancement and excretion of contrast material. Urinary bladder is unremarkable for degree of distension Stomach/Bowel: Radiopaque enteric contrast material traverses the hepatic flexure. Stomach is unremarkable for degree of distension. No pathologic dilation of small or large bowel. Gas fluid levels throughout the colon suggestive of diarrheal illness. Vascular/Lymphatic: Aortic atherosclerosis. No pathologically enlarged abdominal or pelvic lymph nodes. Reproductive: Uterus is surgically absent, no soft tissue nodularity along the vaginal cuff. No suspicious adnexal mass Other: Mild mesenteric edema with some heterogeneity of the omental fat, similar prior. Trace fluid in the pelvis for instance a focal area of fluid in the posterior left hemipelvis on image 111/2. Mild pelvic peritoneal thickening. No discrete peritoneal or omental nodularity. Musculoskeletal: No aggressive lytic or blastic lesion of bone. Degenerative grade 1 L4 on L5 anterolisthesis. Multilevel degenerative changes spine. Diffuse demineralization of bone. IMPRESSION: 1. Status post hysterectomy without soft tissue nodularity along the vaginal cuff. 2. Trace pelvic free fluid and pelvic peritoneal thickening with similar mild mesenteric edema and heterogeneity of the  omental fat. No discrete peritoneal or omental nodularity. 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 Consider attention on short-term interval follow-up imaging. 3. Bronchiectasis with peripheral airway impaction in the right lower lobe and adjacent tree-in-bud nodules as well as additional tree-in-bud nodularity in the right middle lobe, lingula and scattered throughout the right lower lobe. Findings are favored to reflect an atypical infectious or inflammatory process such as atypical mycobacterial infection. 4. Stable ground-glass opacities in the left lower lobe measuring up to 7 mm, nonspecific possibly reflecting an infectious or inflammatory process. Attention on follow-up imaging suggested. 5. Gas fluid levels throughout the colon as can be seen with diarrheal illness 6.  Aortic Atherosclerosis (ICD10-I70.0). Electronically Signed   By: Dahlia Bailiff M.D.   On: 08/07/2022 12:11     Subjective: No acute issue/events overnight   Discharge Exam: Vitals:   08/26/22 2000 08/27/22 0753  BP:  (!) 158/94  Pulse:  (!) 101  Resp:  16  Temp: (!) 101.6 F (38.7 C) 97.6 F (36.4 C)  SpO2:  99%   Vitals:   08/26/22 1847 08/26/22 1958 08/26/22 2000 08/27/22 0753  BP:  (!) 156/87  (!) 158/94  Pulse:  (!) 133  (!) 101  Resp:  18  16  Temp: (!) 100.6 F (38.1 C) 97.7 F (36.5 C) (!) 101.6 F (38.7 C) 97.6 F (36.4 C)  TempSrc: Axillary Oral Rectal Axillary  SpO2:  91%  99%    General: somnolent - poorly responsive Cardiovascular: RRR, S1/S2 +, no rubs, no gallops Respiratory: CTA bilaterally, no wheezing, no rhonchi Abdominal: Soft, NT, ND, bowel sounds + Extremities: no edema, no cyanosis    The results of significant diagnostics from this hospitalization (including imaging, microbiology, ancillary and laboratory) are listed below for reference.     Microbiology: No results found for this or any previous visit (from the past 240 hour(s)).    Labs: BNP (last 3 results) Recent Labs    08/07/22 1458  BNP 63.1   Basic Metabolic Panel: Recent Labs  Lab 08/21/22 0728 08/24/22 0346  NA 138 138  K 3.4* 3.4*  CL 107 110  CO2 23 15*  GLUCOSE 129* 95  BUN 12 12  CREATININE 1.03* 0.89  CALCIUM 9.7 8.8*  MG  --  1.7   Liver Function Tests: Recent Labs  Lab 08/21/22 0728 08/24/22 0346  AST 19 70*  ALT 18 59*  ALKPHOS 79 65  BILITOT 0.4  0.8  PROT 6.2* 5.3*  ALBUMIN 3.6 2.9*   No results for input(s): "LIPASE", "AMYLASE" in the last 168 hours. No results for input(s): "AMMONIA" in the last 168 hours. CBC: Recent Labs  Lab 08/21/22 0728 08/24/22 0346  WBC 5.6 9.4  NEUTROABS 4.0  --   HGB 10.9* 9.6*  HCT 32.0* 28.1*  MCV 98.2 98.9  PLT 157 117*   Cardiac Enzymes: No results for input(s): "CKTOTAL", "CKMB", "CKMBINDEX", "TROPONINI" in the last 168 hours. BNP: Invalid input(s): "POCBNP" CBG: Recent Labs  Lab 08/22/22 0652  GLUCAP 98   D-Dimer No results for input(s): "DDIMER" in the last 72 hours. Hgb A1c No results for input(s): "HGBA1C" in the last 72 hours. Lipid Profile No results for input(s): "CHOL", "HDL", "LDLCALC", "TRIG", "CHOLHDL", "LDLDIRECT" in the last 72 hours. Thyroid function studies No results for input(s): "TSH", "T4TOTAL", "T3FREE", "THYROIDAB" in the last 72 hours.  Invalid input(s): "FREET3" Anemia work up No results for input(s): "VITAMINB12", "FOLATE", "FERRITIN", "TIBC", "IRON", "RETICCTPCT" in the last 72 hours. Urinalysis    Component Value Date/Time   COLORURINE COLORLESS (A) 08/13/2022 2054   APPEARANCEUR CLEAR (A) 08/13/2022 2054   LABSPEC 1.006 08/13/2022 2054   PHURINE 7.0 08/13/2022 2054   [place] 08/13/2022 2054   HGBUR NEGATIVE 08/13/2022 2054   BILIRUBINUR NEGATIVE 08/13/2022 2054   BILIRUBINUR Negative 12/19/2021 [place] 08/13/2022 2054   PROTEINUR NEGATIVE 08/13/2022 2054   UROBILINOGEN 0.2 12/19/2021 0821   NITRITE  NEGATIVE 08/13/2022 2054   LEUKOCYTESUR NEGATIVE 08/13/2022 2054   Sepsis Labs Recent Labs  Lab 08/21/22 0728 08/24/22 0346  WBC 5.6 9.4   Microbiology No results found for this or any previous visit (from the past 240 hour(s)).   Time coordinating discharge: Over 30 minutes  SIGNED:   Little Ishikawa, DO [facility] 08/27/2022, 12:02 PM Pager   If 7PM-7AM, please contact night-coverage [email or web address]

## 2022-08-28 DIAGNOSIS — R4182 Altered mental status, unspecified: Secondary | ICD-10-CM

## 2022-09-02 ENCOUNTER — Encounter: Payer: Self-pay | Admitting: Obstetrics and Gynecology

## 2022-09-02 ENCOUNTER — Encounter: Payer: Self-pay | Admitting: Oncology

## 2022-09-04 NOTE — Progress Notes (Signed)
[facility] Cardiology    SUBJECTIVE: Patient still complains of some weakness and fatigue lightheadedness no nausea vomiting feels like if she preferred to go home.  Headaches are better and improving with physical therapy   Vitals:   08/09/22 1934 08/10/22 0003 08/10/22 0543 08/10/22 0716  BP: (!) 144/82 (!) 122/57 (!) 144/81 134/77  Pulse: 84 74 78 65  Resp: '18 18 18 18  '$ Temp: 98.4 F (36.9 C) 98.4 F (36.9 C) 97.7 F (36.5 C) (!) 96.9 F (36.1 C)  TempSrc:   Oral Axillary  SpO2: 96% 96% 100% 99%  Weight:      Height:        No intake or output data in the 24 hours ending 09/04/22 1520    PHYSICAL EXAM  General: Well developed, well nourished, in no acute distress HEENT:  Normocephalic and atramatic Neck:  No JVD.  Lungs: Clear bilaterally to auscultation and percussion. Heart: HRRR . Normal S1 and S2 without gallops or murmurs.  Abdomen: Bowel sounds are positive, abdomen soft and non-tender  Msk:  Back normal, normal gait. Normal strength and tone for age. Extremities: No clubbing, cyanosis or edema.   Neuro: Alert and oriented X 3. Psych:  Good affect, responds appropriately   LABS: Basic Metabolic Panel: No results for input(s): "NA", "K", "CL", "CO2", "GLUCOSE", "BUN", "CREATININE", "CALCIUM", "MG", "PHOS" in the last 72 hours. Liver Function Tests: No results for input(s): "AST", "ALT", "ALKPHOS", "BILITOT", "PROT", "ALBUMIN" in the last 72 hours. No results for input(s): "LIPASE", "AMYLASE" in the last 72 hours. CBC: No results for input(s): "WBC", "NEUTROABS", "HGB", "HCT", "MCV", "PLT" in the last 72 hours. Cardiac Enzymes: No results for input(s): "CKTOTAL", "CKMB", "CKMBINDEX", "TROPONINI" in the last 72 hours. BNP: Invalid input(s): "POCBNP" D-Dimer: No results for input(s): "DDIMER" in the last 72 hours. Hemoglobin A1C: No results for input(s): "HGBA1C" in the last 72 hours. Fasting Lipid Panel: No results for input(s): "CHOL", "HDL", "LDLCALC",  "TRIG", "CHOLHDL", "LDLDIRECT" in the last 72 hours. Thyroid Function Tests: No results for input(s): "TSH", "T4TOTAL", "T3FREE", "THYROIDAB" in the last 72 hours.  Invalid input(s): "FREET3" Anemia Panel: No results for input(s): "VITAMINB12", "FOLATE", "FERRITIN", "TIBC", "IRON", "RETICCTPCT" in the last 72 hours.  No results found.   Echo pending  TELEMETRY: Normal sinus rhythm rate of 85 nonspecific ST-T wave changes:  ASSESSMENT AND PLAN:  Principal Problem:   Dizziness Active Problems:   Acid reflux   Iron deficiency anemia   Elevated troponin I measurement   H/O: GI bleed    Plan Multiple CVAs unclear etiology recommend anticoagulation work-up and treatment as per neurology Continue aspirin Plavix for CVA consider adding Eliquis No clear evidence of atrial fibrillation but with multiple CVA events would strongly consider considering Eliquis Agree with rehab services for gait and strength training Echocardiogram pending for evaluation of thrombus and source of emboli Troponins are probably from demand ischemia recommend conservative management Recommend outpatient follow-up after patient discharged   Yolonda Kida, MD, 09/04/2022 3:20 PM

## 2022-09-04 NOTE — Telephone Encounter (Signed)
Contacted [name], regarding Elizabeth Friedman, thanked him for informing of her recent death, expressed condolences.    Dr. Marcelline Mates

## 2022-09-25 DEATH — deceased

## 2022-11-18 ENCOUNTER — Ambulatory Visit: Payer: PPO

## 2023-02-08 ENCOUNTER — Ambulatory Visit: Payer: PPO | Admitting: Oncology

## 2023-02-08 ENCOUNTER — Other Ambulatory Visit: Payer: PPO

## 2023-09-09 IMAGING — CT CT CHEST-ABD-PELV W/ CM
2 of 5 series · 9 of 36 positions shown, 14 images · IV contrast (agent unspecified)
Comparison: None.

CLINICAL DATA: Carcinosarcoma of the uterus.

EXAM:
CT CHEST, ABDOMEN, AND PELVIS WITH CONTRAST
TECHNIQUE: Multidetector CT imaging of the chest, abdomen and pelvis was
performed following the standard protocol during bolus
administration of intravenous contrast.

[Series 2: axials cap 5.00 · axial · 0.62mm/px · z∈[-1379,-934]mm · 6 of 125 slices shown, 11 images]
[im 18/125  mediastinal]
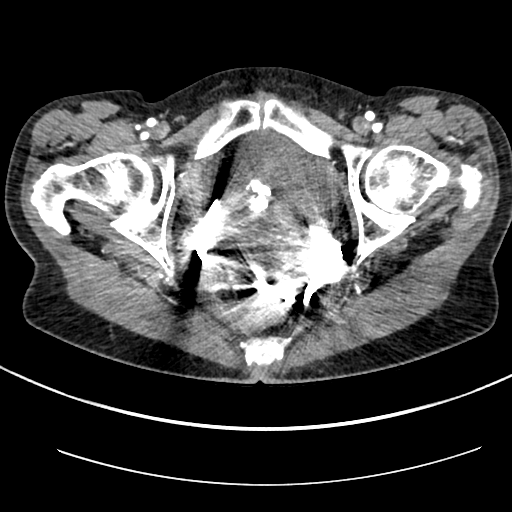
[im 18/125  bone]
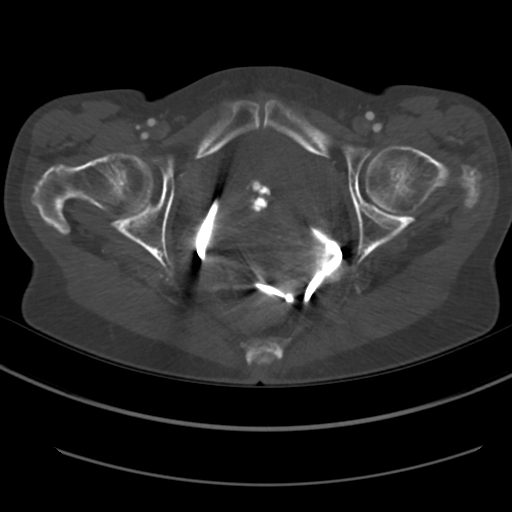
[im 36/125  mediastinal]
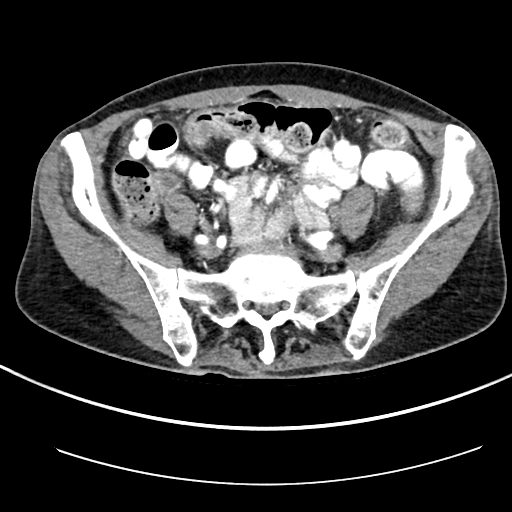
[im 54/125  mediastinal]
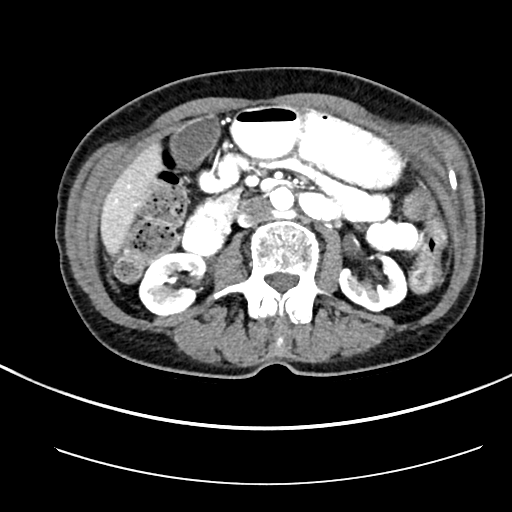
[im 54/125  lung]
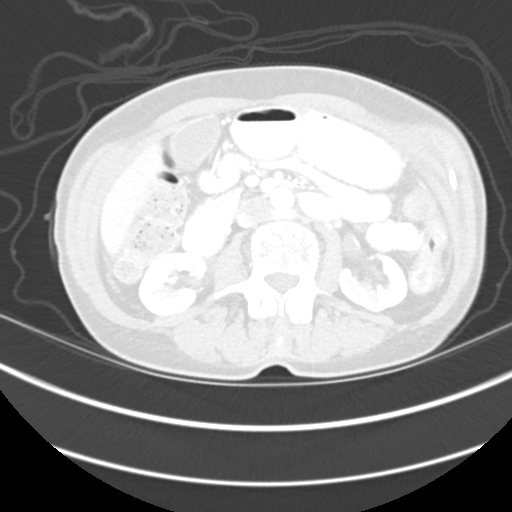
[im 71/125  mediastinal]
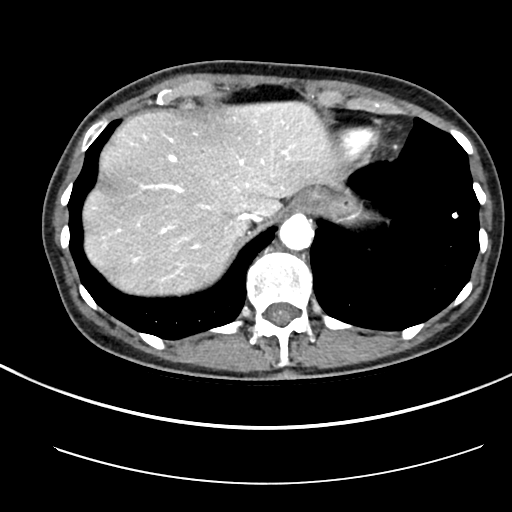
[im 71/125  lung]
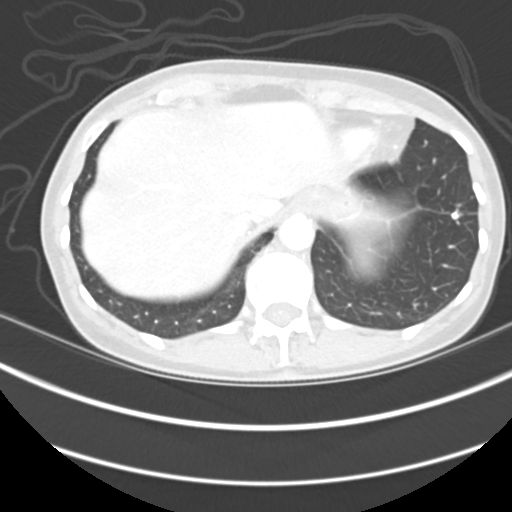
[im 89/125  mediastinal]
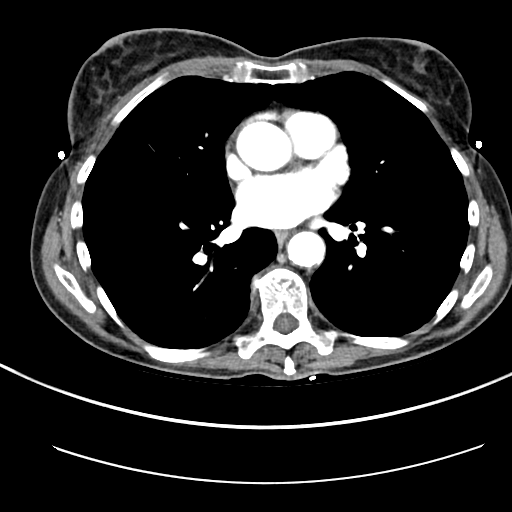
[im 89/125  lung]
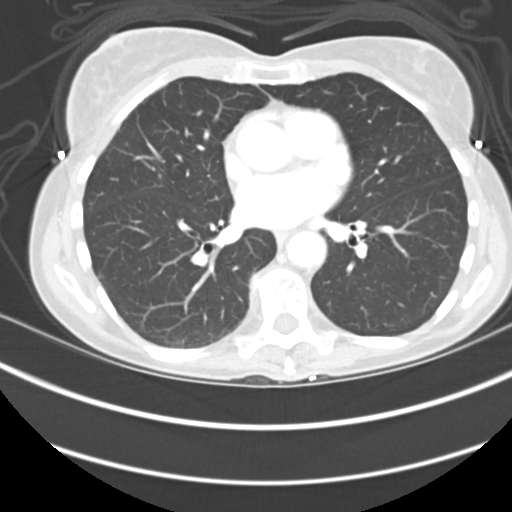
[im 107/125  mediastinal]
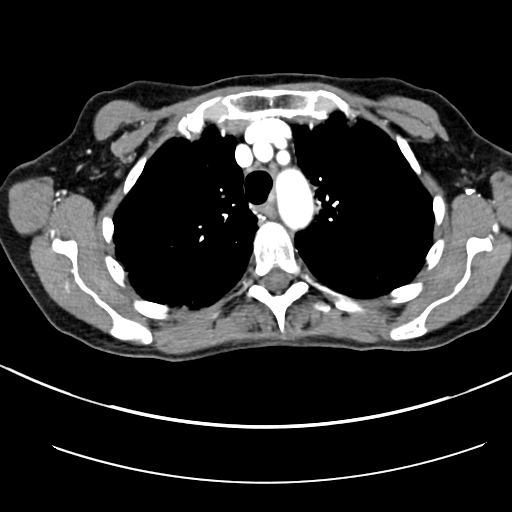
[im 107/125  lung]
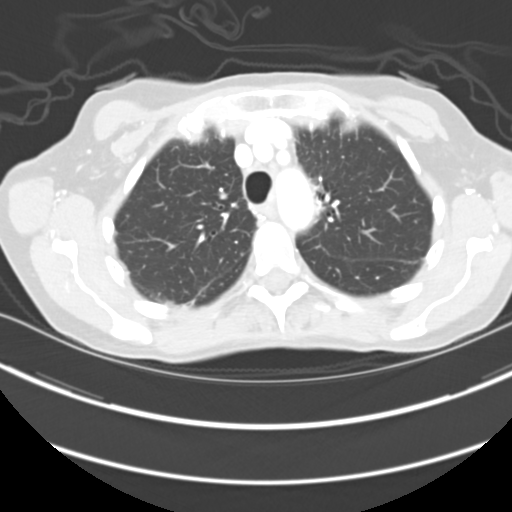

[Series 4: coronals cap 2.00 cor · coronal · 0.62mm/px · 3 of 155 slices shown]
[im 31/155  mediastinal]
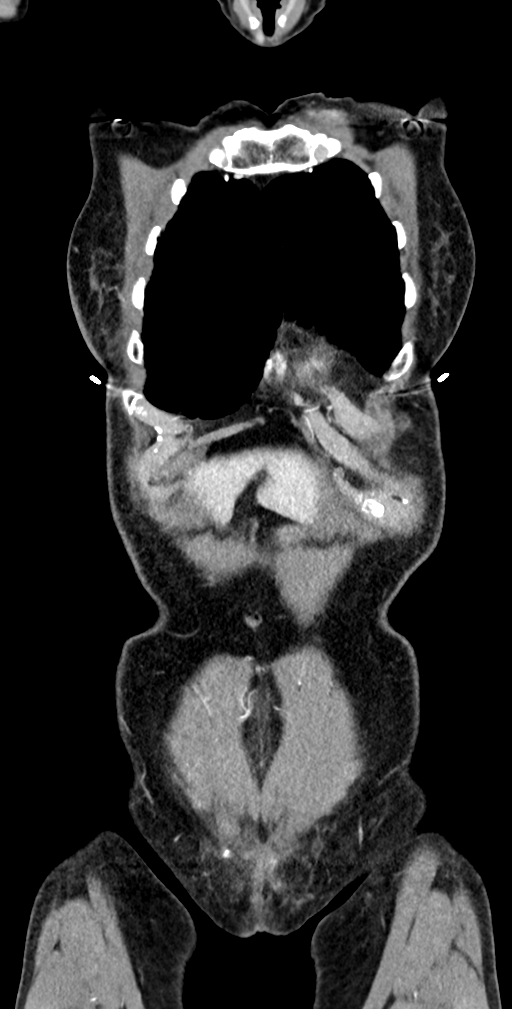
[im 62/155  mediastinal]
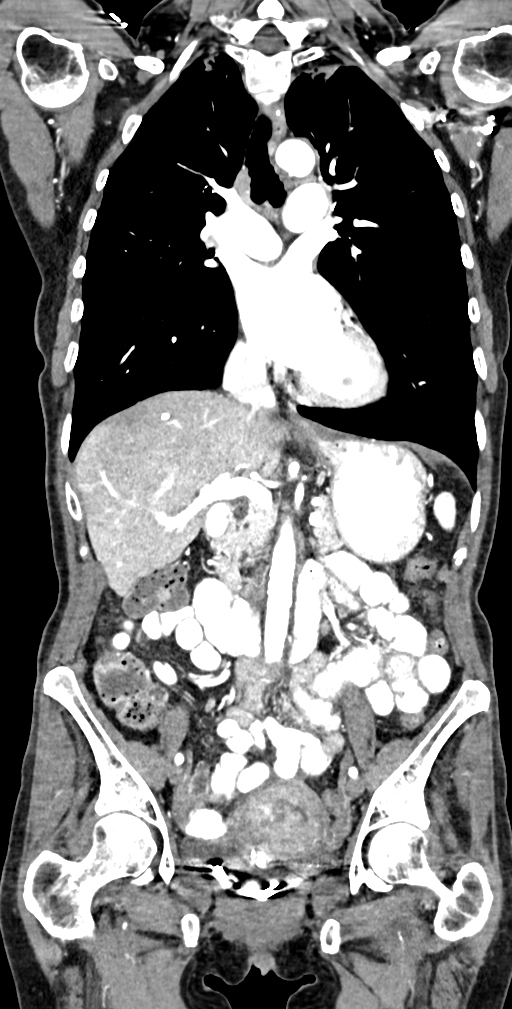
[im 93/155  mediastinal]
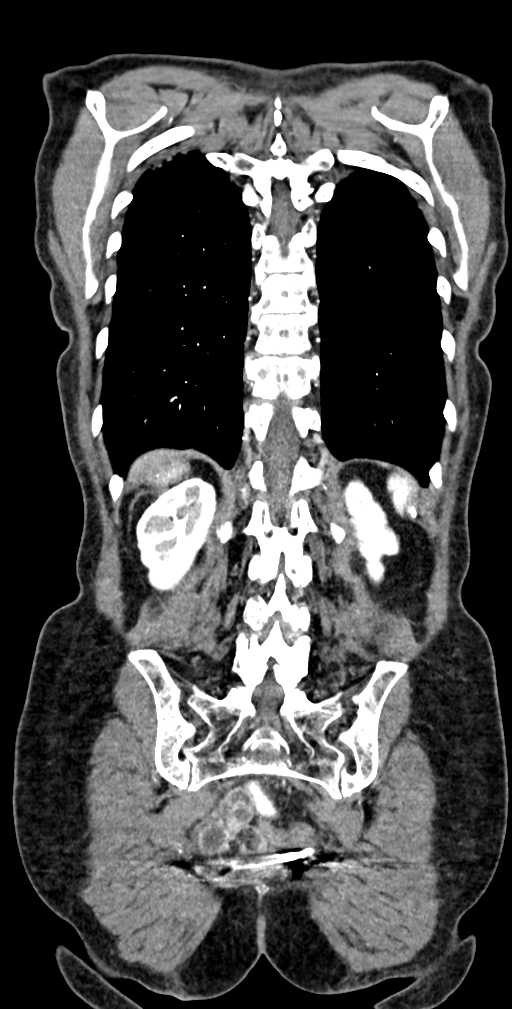

[9 of 36 positions shown; findings below may reference images not displayed]

RADIATION DOSE REDUCTION: This exam was performed according to the
departmental dose-optimization program which includes automated
exposure control, adjustment of the mA and/or kV according to
patient size and/or use of iterative reconstruction technique.

CONTRAST:  75mL OMNIPAQUE IOHEXOL 300 MG/ML  SOLN
FINDINGS: CT CHEST FINDINGS

Cardiovascular: The heart size is normal. No substantial pericardial
effusion. Mild atherosclerotic calcification is noted in the wall of
the thoracic aorta.

Mediastinum/Nodes: No mediastinal lymphadenopathy. Calcified nodal
tissue in the subcarinal station suggests prior granulomatous
disease. There is no hilar lymphadenopathy. The esophagus has normal
imaging features. There is no axillary lymphadenopathy.

Lungs/Pleura: Biapical pleuroparenchymal scarring evident. Mild
bronchiectasis with peripheral airway impaction noted right middle
lobe with small geographic area of tree-in-bud opacity noted right
lower lobe (image 101/3). Calcified granuloma noted left lower lobe.
There are several tiny ground-glass opacities in the left lower
lobe, nonspecific. No pleural effusion.

Musculoskeletal: No worrisome lytic or sclerotic osseous
abnormality.

CT ABDOMEN PELVIS FINDINGS

Hepatobiliary: No suspicious focal abnormality within the liver
parenchyma. There is no evidence for gallstones, gallbladder wall
thickening, or pericholecystic fluid. Common bile duct measures 5 mm
diameter in the head of the pancreas, within normal limits.

Pancreas: No focal mass lesion. No dilatation of the main duct. No
intraparenchymal cyst. No peripancreatic edema.

Spleen: No splenomegaly. No focal mass lesion. Calcified granulomata
noted.

Adrenals/Urinary Tract: No adrenal nodule or mass. Kidneys
unremarkable. No evidence for hydroureter. The urinary bladder
appears normal for the degree of distention. Bladder is partially
obscured by beam hardening artifact from what appears to be the
patient's known pessary although the appearance is somewhat atypical
in configuration.

Stomach/Bowel: Stomach is moderately distended with contrast
material. Duodenum is normally positioned as is the ligament of
Tre[place]. No small bowel wall thickening. No small bowel dilatation.
The terminal ileum is normal. The appendix is not well visualized,
but there is no edema or inflammation in the region of the cecum. No
gross colonic mass. No colonic wall thickening.

Vascular/Lymphatic: There is moderate atherosclerotic calcification
of the abdominal aorta without aneurysm. There is no gastrohepatic
or hepatoduodenal ligament lymphadenopathy. No retroperitoneal or
mesenteric lymphadenopathy. No pelvic sidewall lymphadenopathy.

Reproductive: Heterogeneously enhancing lesion identified anterior
uterine fundus measuring approximately 3.4 x 2.2 x 2.7 cm. Coarse
dystrophic calcification is identified between the anterior lower
uterine segment in the posterior bladder base and may represent an
exophytic fibroid. Uterine and parametrial anatomy is not well
evaluated by CT, but beam hardening artifact from the patient's
ring-like metallic pessary essentially obscures the pelvic floor,
cervix, and uterine/adnexal anatomy.

Other: No substantial intraperitoneal free fluid.

Musculoskeletal: No worrisome lytic or sclerotic osseous
abnormality. Trace anterolisthesis of L4 on 5 evident.
IMPRESSION: 1. 3.4 x 2.2 x 2.7 cm heterogeneously enhancing lesion anterior
uterine fundus, likely representing known neoplasm. Uterine and
parametrial anatomy is essentially obscured by beam hardening
artifact from the patient's ring-like metallic pessary severely
limiting assessment of the uterus, cervix, and adnexal anatomy.
2. No evidence for distant metastatic disease in the chest, abdomen,
or pelvis.
3. Mild bronchiectasis with peripheral airway impaction noted right
middle lobe with small geographic area of tree-in-bud opacity in the
right lower lobe. Imaging features are most suggestive of an
infectious/inflammatory etiology. Atypical infection (including RTOYOTA)
could have this appearance.
4. Tiny ground-glass opacities in the left lower lobe, nonspecific
but likely infectious/inflammatory. Attention on follow-up
recommended.
5. Aortic Atherosclerosis ([id number]).
